# Patient Record
Sex: Male | Born: 1950
Health system: Southern US, Community
[De-identification: ages and names within clinical notes are randomized; demographics above are authoritative.]

## PROBLEM LIST (undated history)

## (undated) DIAGNOSIS — I8500 Esophageal varices without bleeding: Secondary | ICD-10-CM

## (undated) DIAGNOSIS — M1990 Unspecified osteoarthritis, unspecified site: Secondary | ICD-10-CM

## (undated) DIAGNOSIS — T7840XA Allergy, unspecified, initial encounter: Secondary | ICD-10-CM

## (undated) DIAGNOSIS — I10 Essential (primary) hypertension: Secondary | ICD-10-CM

## (undated) DIAGNOSIS — J45909 Unspecified asthma, uncomplicated: Secondary | ICD-10-CM

## (undated) DIAGNOSIS — Z973 Presence of spectacles and contact lenses: Secondary | ICD-10-CM

## (undated) DIAGNOSIS — K7460 Unspecified cirrhosis of liver: Secondary | ICD-10-CM

## (undated) DIAGNOSIS — K635 Polyp of colon: Secondary | ICD-10-CM

## (undated) DIAGNOSIS — K648 Other hemorrhoids: Secondary | ICD-10-CM

## (undated) DIAGNOSIS — I499 Cardiac arrhythmia, unspecified: Secondary | ICD-10-CM

## (undated) DIAGNOSIS — B1920 Unspecified viral hepatitis C without hepatic coma: Secondary | ICD-10-CM

## (undated) DIAGNOSIS — K5190 Ulcerative colitis, unspecified, without complications: Secondary | ICD-10-CM

## (undated) DIAGNOSIS — K226 Gastro-esophageal laceration-hemorrhage syndrome: Secondary | ICD-10-CM

## (undated) DIAGNOSIS — C801 Malignant (primary) neoplasm, unspecified: Secondary | ICD-10-CM

## (undated) DIAGNOSIS — F1010 Alcohol abuse, uncomplicated: Secondary | ICD-10-CM

## (undated) DIAGNOSIS — IMO0001 Reserved for inherently not codable concepts without codable children: Secondary | ICD-10-CM

## (undated) DIAGNOSIS — C220 Liver cell carcinoma: Secondary | ICD-10-CM

## (undated) HISTORY — DX: Polyp of colon: K63.5

## (undated) HISTORY — DX: Alcohol abuse, uncomplicated: F10.10

## (undated) HISTORY — DX: Allergy, unspecified, initial encounter: T78.40XA

## (undated) HISTORY — DX: Esophageal varices without bleeding: I85.00

## (undated) HISTORY — DX: Unspecified osteoarthritis, unspecified site: M19.90

## (undated) HISTORY — DX: Unspecified asthma, uncomplicated: J45.909

## (undated) HISTORY — DX: Liver cell carcinoma: C22.0

## (undated) HISTORY — DX: Ulcerative colitis, unspecified, without complications: K51.90

## (undated) HISTORY — DX: Essential (primary) hypertension: I10

## (undated) HISTORY — DX: Gastro-esophageal laceration-hemorrhage syndrome: K22.6

## (undated) HISTORY — DX: Other hemorrhoids: K64.8

---

## 1989-10-18 HISTORY — PX: SKIN GRAFT: SHX250

## 2008-06-16 ENCOUNTER — Emergency Department (HOSPITAL_COMMUNITY): Admission: AC | Admit: 2008-06-16 | Discharge: 2008-06-16 | Payer: Self-pay

## 2010-01-16 ENCOUNTER — Emergency Department (HOSPITAL_COMMUNITY): Admission: EM | Admit: 2010-01-16 | Discharge: 2010-01-16 | Payer: Self-pay | Admitting: Emergency Medicine
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
## 2014-07-18 ENCOUNTER — Encounter (HOSPITAL_COMMUNITY): Payer: Self-pay | Admitting: Emergency Medicine

## 2014-07-18 ENCOUNTER — Emergency Department (HOSPITAL_COMMUNITY)
Admission: EM | Admit: 2014-07-18 | Discharge: 2014-07-18 | Disposition: A | Discharge status: Home / Self Care | Payer: Medicaid Other | Attending: Emergency Medicine | Admitting: Emergency Medicine

## 2014-07-18 DIAGNOSIS — M5416 Radiculopathy, lumbar region: Secondary | ICD-10-CM | POA: Insufficient documentation

## 2014-07-18 DIAGNOSIS — Z8619 Personal history of other infectious and parasitic diseases: Secondary | ICD-10-CM | POA: Insufficient documentation

## 2014-07-18 DIAGNOSIS — Z720 Tobacco use: Secondary | ICD-10-CM | POA: Insufficient documentation

## 2014-07-18 HISTORY — DX: Unspecified viral hepatitis C without hepatic coma: B19.20

## 2014-07-18 MED ORDER — CYCLOBENZAPRINE HCL 10 MG PO TABS: 10.0000 mg | ORAL_TABLET | Freq: Two times a day (BID) | ORAL | Status: DC | PRN

## 2014-07-18 MED ORDER — TRAMADOL HCL 50 MG PO TABS: 50.0000 mg | ORAL_TABLET | Freq: Four times a day (QID) | ORAL | Status: DC | PRN

## 2014-07-18 NOTE — Discharge Instructions (Signed)
Take tramadol as directed as needed for pain. No driving or operating heavy machinery while taking flexeril. This medication may make you drowsy.  Lumbosacral Radiculopathy Lumbosacral radiculopathy is a pinched nerve or nerves in the low back (lumbosacral area). When this happens you may have weakness in your legs and may not be able to stand on your toes. You may have pain going down into your legs. There may be difficulties with walking normally. There are many causes of this problem. Sometimes this may happen from an injury, or simply from arthritis or boney problems. It may also be caused by other illnesses such as diabetes. If there is no improvement after treatment, further studies may be done to find the exact cause. DIAGNOSIS  X-rays may be needed if the problems become long standing. Electromyograms may be done. This study is one in which the working of nerves and muscles is studied. HOME CARE INSTRUCTIONS   Applications of ice packs may be helpful. Ice can be used in a plastic bag with a towel around it to prevent frostbite to skin. This may be used every 2 hours for 20 to 30 minutes, or as needed, while awake, or as directed by your caregiver.  Only take over-the-counter or prescription medicines for pain, discomfort, or fever as directed by your caregiver.  If physical therapy was prescribed, follow your caregiver's directions. SEEK IMMEDIATE MEDICAL CARE IF:   You have pain not controlled with medications.  You seem to be getting worse rather than better.  You develop increasing weakness in your legs.  You develop loss of bowel or bladder control.  You have difficulty with walking or balance, or develop clumsiness in the use of your legs.  You have a fever. MAKE SURE YOU:   Understand these instructions.  Will watch your condition.  Will get help right away if you are not doing well or get worse. Document Released: 10/04/2005 Document Revised: 12/27/2011 Document  Reviewed: 05/24/2008 [facility] Patient Information 2015 [place], [place]. This information is not intended to replace advice given to you by your health care provider. Make sure you discuss any questions you have with your health care provider.

## 2014-07-18 NOTE — ED Notes (Signed)
Pt c/o lower back pain x 2 weeks 

## 2014-07-18 NOTE — ED Provider Notes (Signed)
CSN: [id number]     Arrival date & time 07/18/14  5809 This chart was scribed for non-physician practitioner, Michele Mcalpine, PA-C, working with Carmin Muskrat, MD by Ladene Artist, ED Scribe. This patient was seen in room TR06C/TR06C and the patient's care was started at 9:08 AM.   Chief Complaint  Patient presents with  . Back Pain   The history is provided by the patient. No language interpreter was used.   HPI Comments: Peter Nelson is a 63 y.o. male who presents to the Emergency Department complaining of constant lower back pain for the last 2 weeks, worse since last night. Pt was trying to get out of bed when he initially noted the pain. Pain radiates down L leg. Pt currently rates his pain 7/10. He denies injury. He also denies urinary or bowel incontinence, numbness in groin or lower extremities. Pt has tried 5 Aleve tablets daily without relief. No h/o CA or IV drug use. No PCP; pt was recently released from prison 1 month ago.  Past Medical History  Diagnosis Date  . Hepatitis C    History reviewed. No pertinent past surgical history. History reviewed. No pertinent family history. History  Substance Use Topics  . Smoking status: Current Every Day Smoker  . Smokeless tobacco: Not on file  . Alcohol Use: Yes     Comment: occ    Review of Systems  Musculoskeletal: Positive for back pain.  Neurological: Negative for numbness.  All other systems reviewed and are negative.  Allergies  Aspirin  Home Medications   Prior to Admission medications   Medication Sig Start Date End Date Taking? Authorizing Provider  naproxen sodium (ALEVE) 220 MG tablet Take 440 mg by mouth 2 (two) times daily as needed (for pain).   Yes Historical Provider, MD  cyclobenzaprine (FLEXERIL) 10 MG tablet Take 1 tablet (10 mg total) by mouth 2 (two) times daily as needed for muscle spasms. 07/18/14   Illene Labrador, PA-C  traMADol (ULTRAM) 50 MG tablet Take 1 tablet (50 mg total) by mouth every 6  (six) hours as needed. 07/18/14   Illene Labrador, PA-C   Triage Vitals: BP 149/73  Pulse 81  Temp(Src) 97.7 F (36.5 C) (Oral)  Resp 18  SpO2 100% Physical Exam  Nursing note and vitals reviewed. Constitutional: He is oriented to person, place, and time. He appears well-developed and well-nourished. No distress.  HENT:  Head: Normocephalic and atraumatic.  Mouth/Throat: Oropharynx is clear and moist.  Eyes: Conjunctivae are normal.  Neck: Normal range of motion. Neck supple. No spinous process tenderness and no muscular tenderness present.  Cardiovascular: Normal rate, regular rhythm and normal heart sounds.   Pulmonary/Chest: Effort normal and breath sounds normal. No respiratory distress.  Musculoskeletal: He exhibits no edema.  Tenderness to palpation L lumbar paraspinal muscles and SI joint No spinal process tenderness Negative straight leg test bilateral  Neurological: He is alert and oriented to person, place, and time. He has normal strength.  Strength lower extremities 5/5 and equal bilateral. Sensation intact. Normal gait.  Skin: Skin is warm and dry. No rash noted. He is not diaphoretic.  Psychiatric: He has a normal mood and affect. His behavior is normal.   ED Course  Procedures (including critical care time) DIAGNOSTIC STUDIES: Oxygen Saturation is 100% on RA, normal by my interpretation.    COORDINATION OF CARE: 9:12 AM-Discussed treatment plan which includes medication for pain and follow-up with pt at bedside and pt agreed to plan.  Labs Review Labs Reviewed - No data to display  Imaging Review No results found.   EKG Interpretation None      MDM   Final diagnoses:  Lumbar radiculopathy    Patient with back pain, he is well appearing and in no apparent distress. Afebrile, vital signs stable. No red flags concerning patient's back pain. No s/s of central cord compression or cauda equina. Lower extremities are neurovascularly intact and patient is  ambulating without difficulty. Treat with tramadol, Flexeril. Resources given for PCP followup. Stable for discharge. Return precautions given. Patient states understanding of treatment care plan and is agreeable.  I personally performed the services described in this documentation, which was scribed in my presence. The recorded information has been reviewed and is accurate.    Illene Labrador, PA-C 07/18/14 [phone]

## 2014-07-18 NOTE — ED Provider Notes (Signed)
Medical screening examination/treatment/procedure(s) were performed by non-physician practitioner and as supervising physician I was immediately available for consultation/collaboration.  Carmin Muskrat, MD 07/18/14 [phone]

## 2014-07-24 ENCOUNTER — Ambulatory Visit: Payer: Self-pay

## 2014-11-13 ENCOUNTER — Encounter (HOSPITAL_COMMUNITY): Payer: Self-pay | Admitting: Emergency Medicine

## 2014-11-13 ENCOUNTER — Emergency Department (HOSPITAL_COMMUNITY): Payer: Medicaid Other

## 2014-11-13 ENCOUNTER — Emergency Department (HOSPITAL_COMMUNITY)
Admission: EM | Admit: 2014-11-13 | Discharge: 2014-11-13 | Disposition: A | Discharge status: Home / Self Care | Payer: Medicaid Other | Attending: Emergency Medicine | Admitting: Emergency Medicine

## 2014-11-13 DIAGNOSIS — R059 Cough, unspecified: Secondary | ICD-10-CM

## 2014-11-13 DIAGNOSIS — F1010 Alcohol abuse, uncomplicated: Secondary | ICD-10-CM | POA: Insufficient documentation

## 2014-11-13 DIAGNOSIS — R05 Cough: Secondary | ICD-10-CM | POA: Insufficient documentation

## 2014-11-13 DIAGNOSIS — R079 Chest pain, unspecified: Secondary | ICD-10-CM | POA: Insufficient documentation

## 2014-11-13 DIAGNOSIS — Z720 Tobacco use: Secondary | ICD-10-CM | POA: Insufficient documentation

## 2014-11-13 DIAGNOSIS — Z8619 Personal history of other infectious and parasitic diseases: Secondary | ICD-10-CM | POA: Insufficient documentation

## 2014-11-13 LAB — COMPREHENSIVE METABOLIC PANEL
ALT: 107 U/L — ABNORMAL HIGH (ref 0–53)
ANION GAP: 11 (ref 5–15)
AST: 159 U/L — ABNORMAL HIGH (ref 0–37)
Albumin: 3.5 g/dL (ref 3.5–5.2)
Alkaline Phosphatase: 63 U/L (ref 39–117)
BILIRUBIN TOTAL: 1.1 mg/dL (ref 0.3–1.2)
BUN: 10 mg/dL (ref 6–23)
CHLORIDE: 106 mmol/L (ref 96–112)
CO2: 25 mmol/L (ref 19–32)
Calcium: 8.8 mg/dL (ref 8.4–10.5)
Creatinine, Ser: 0.97 mg/dL (ref 0.50–1.35)
GFR calc non Af Amer: 86 mL/min — ABNORMAL LOW (ref >90)
Glucose, Bld: 94 mg/dL (ref 70–99)
POTASSIUM: 3.6 mmol/L (ref 3.5–5.1)
Sodium: 142 mmol/L (ref 135–145)
Total Protein: 7.4 g/dL (ref 6.0–8.3)

## 2014-11-13 LAB — CBC WITH DIFFERENTIAL/PLATELET
BASOS ABS: 0 10*3/uL (ref 0.0–0.1)
BASOS PCT: 1 % (ref 0–1)
EOS ABS: 0.2 10*3/uL (ref 0.0–0.7)
EOS PCT: 4 % (ref 0–5)
HCT: 43.6 % (ref 39.0–52.0)
Hemoglobin: 15.2 g/dL (ref 13.0–17.0)
LYMPHS ABS: 2.3 10*3/uL (ref 0.7–4.0)
Lymphocytes Relative: 47 % — ABNORMAL HIGH (ref 12–46)
MCH: 32.5 pg (ref 26.0–34.0)
MCHC: 34.9 g/dL (ref 30.0–36.0)
MCV: 93.2 fL (ref 78.0–100.0)
MONO ABS: 0.4 10*3/uL (ref 0.1–1.0)
MONOS PCT: 9 % (ref 3–12)
Neutro Abs: 1.9 10*3/uL (ref 1.7–7.7)
Neutrophils Relative %: 39 % — ABNORMAL LOW (ref 43–77)
PLATELETS: 191 10*3/uL (ref 150–400)
RBC: 4.68 MIL/uL (ref 4.22–5.81)
RDW: 12.3 % (ref 11.5–15.5)
WBC: 4.8 10*3/uL (ref 4.0–10.5)

## 2014-11-13 LAB — TROPONIN I: Troponin I: <0.03 ng/mL (ref <0.031)

## 2014-11-13 MED ORDER — SODIUM CHLORIDE 0.9 % IV BOLUS (SEPSIS)
500.0000 mL | Freq: Once | INTRAVENOUS | Status: AC
  Administered 2014-11-13: 500 mL via INTRAVENOUS

## 2014-11-13 NOTE — ED Notes (Signed)
IV team at bedside 

## 2014-11-13 NOTE — ED Notes (Signed)
Pt from home via [facility] with c/o productive cough x 2 weeks with yellow sputum.  Pt reports around 2 hours ago he began having intermittent sharp stabbing pain on his left chest/side lasting 20-30 seconds.  Denies SOB, N/V, diaphoresis.  Pt in NAD, A&O.

## 2014-11-13 NOTE — ED Provider Notes (Signed)
CSN: [id number]     Arrival date & time 11/13/14  1335 History   First MD Initiated Contact with Patient 11/13/14 1337     Chief Complaint  Patient presents with  . Chest Pain     (Consider location/radiation/quality/duration/timing/severity/associated sxs/prior Treatment) The history is provided by the patient. No language interpreter was used.  Aviv Lengacher Viana is a 64 y/o M with PMHx of Hepatitis C presenting to the ED with chest pain that started approximately 3 hours prior to arrival. Patient reported that the chest pain was localized left-sided the chest described as a stabbing sensation that comes and goes lasting approximately half the second. Patient reports that there is no radiation of pain, stays within the left side of the chest. Reported that he is not taking anything for the discomfort. Reported that for the past 2 weeks he's been having coughing of a yellowish phlegm. Stated that he has never been seen before regarding cardiology issues. Stated that his sister had history of myocardial infarction when she was 64 years old and myocardial infarction in his brother when he was in his early 5s. Patient reported that he smokes a half a pack of cigarettes per day. Stated that he drinks half a pint of gin daily. Denied dizziness, chills, shortness of breath, nausea, vomiting, diaphoresis, left arm pain, neck pain, neck stiffness, abdominal pain, nasal congestion, fever, blurred vision, sudden loss of vision. PCP none  Past Medical History  Diagnosis Date  . Hepatitis C    History reviewed. No pertinent past surgical history. History reviewed. No pertinent family history. History  Substance Use Topics  . Smoking status: Current Every Day Smoker -- 0.50 packs/day    Types: Cigarettes  . Smokeless tobacco: Never Used  . Alcohol Use: Yes     Comment: "a pint and half a day"    Review of Systems  Constitutional: Negative for fever and chills.  Eyes: Negative for visual disturbance.   Respiratory: Positive for cough. Negative for chest tightness and shortness of breath.   Cardiovascular: Positive for chest pain.  Gastrointestinal: Negative for nausea, vomiting and abdominal pain.  Musculoskeletal: Negative for back pain and neck pain.  Neurological: Negative for dizziness, weakness, numbness and headaches.      Allergies  Aspirin  Home Medications   Prior to Admission medications   Medication Sig Start Date End Date Taking? Authorizing Provider  DM-Doxylamine-Acetaminophen (NYQUIL COLD & FLU PO) Take 1 tablet by mouth at bedtime as needed (cough).   Yes Historical Provider, MD  naproxen sodium (ALEVE) 220 MG tablet Take 440 mg by mouth 2 (two) times daily as needed (for pain).   Yes Historical Provider, MD  Pseudoephedrine-APAP-DM (DAYQUIL MULTI-SYMPTOM COLD/FLU PO) Take 1 tablet by mouth daily as needed (cold symptoms).   Yes Historical Provider, MD   BP 164/89 mmHg  Pulse 73  Temp(Src) 98 F (36.7 C) (Oral)  Resp 18  Ht 5' 6"  (1.676 m)  Wt 145 lb (65.772 kg)  BMI 23.41 kg/m2  SpO2 97% Physical Exam  Constitutional: He is oriented to person, place, and time. He appears well-developed and well-nourished. No distress.  Smell of alcohol on breath  HENT:  Head: Normocephalic and atraumatic.  Eyes: Conjunctivae and EOM are normal. Pupils are equal, round, and reactive to light.  Neck: Normal range of motion. Neck supple.  Cardiovascular: Normal rate, regular rhythm and normal heart sounds.  Exam reveals no friction rub.   No murmur heard. Pulses:  Radial pulses are 2+ on the right side, and 2+ on the left side.  Pulmonary/Chest: Effort normal and breath sounds normal. No respiratory distress. He has no wheezes. He has no rales.  Musculoskeletal: Normal range of motion.  Neurological: He is alert and oriented to person, place, and time. No cranial nerve deficit. He exhibits normal muscle tone. Coordination normal.  Cranial nerves III-XII grossly  intact Strength 5+/5+ to upper and lower extremities bilaterally with resistance applied, equal distribution noted Equal grip strength bilaterally Negative arm drift Patient follows commands well Patient responds to questions appropriately  Skin: Skin is warm and dry. No rash noted. He is not diaphoretic. No erythema.  Psychiatric: He has a normal mood and affect. His behavior is normal. Thought content normal.  Nursing note and vitals reviewed.   ED Course  Procedures (including critical care time)  Results for orders placed or performed during the hospital encounter of 11/13/14  CBC with Differential/Platelet  Result Value Ref Range   WBC 4.8 4.0 - 10.5 K/uL   RBC 4.68 4.22 - 5.81 MIL/uL   Hemoglobin 15.2 13.0 - 17.0 g/dL   HCT 43.6 39.0 - 52.0 %   MCV 93.2 78.0 - 100.0 fL   MCH 32.5 26.0 - 34.0 pg   MCHC 34.9 30.0 - 36.0 g/dL   RDW 12.3 11.5 - 15.5 %   Platelets 191 150 - 400 K/uL   Neutrophils Relative % 39 (L) 43 - 77 %   Neutro Abs 1.9 1.7 - 7.7 K/uL   Lymphocytes Relative 47 (H) 12 - 46 %   Lymphs Abs 2.3 0.7 - 4.0 K/uL   Monocytes Relative 9 3 - 12 %   Monocytes Absolute 0.4 0.1 - 1.0 K/uL   Eosinophils Relative 4 0 - 5 %   Eosinophils Absolute 0.2 0.0 - 0.7 K/uL   Basophils Relative 1 0 - 1 %   Basophils Absolute 0.0 0.0 - 0.1 K/uL  Comprehensive metabolic panel  Result Value Ref Range   Sodium 142 135 - 145 mmol/L   Potassium 3.6 3.5 - 5.1 mmol/L   Chloride 106 96 - 112 mmol/L   CO2 25 19 - 32 mmol/L   Glucose, Bld 94 70 - 99 mg/dL   BUN 10 6 - 23 mg/dL   Creatinine, Ser 0.97 0.50 - 1.35 mg/dL   Calcium 8.8 8.4 - 10.5 mg/dL   Total Protein 7.4 6.0 - 8.3 g/dL   Albumin 3.5 3.5 - 5.2 g/dL   AST 159 (H) 0 - 37 U/L   ALT 107 (H) 0 - 53 U/L   Alkaline Phosphatase 63 39 - 117 U/L   Total Bilirubin 1.1 0.3 - 1.2 mg/dL   GFR calc non Af Amer 86 (L) >90 mL/min   GFR calc Af Amer >90 >90 mL/min   Anion gap 11 5 - 15  Troponin I  Result Value Ref Range    Troponin I <0.03 <0.031 ng/mL    Labs Review Labs Reviewed  CBC WITH DIFFERENTIAL/PLATELET - Abnormal; Notable for the following:    Neutrophils Relative % 39 (*)    Lymphocytes Relative 47 (*)    All other components within normal limits  COMPREHENSIVE METABOLIC PANEL - Abnormal; Notable for the following:    AST 159 (*)    ALT 107 (*)    GFR calc non Af Amer 86 (*)    All other components within normal limits  TROPONIN I  TROPONIN I    Imaging Review Dg  Chest 2 View  11/13/2014   CLINICAL DATA:  64 year old male with cough and left chest pain for 2 weeks. Initial encounter. Current history of hepatitis-C.  EXAM: CHEST  2 VIEW  COMPARISON:  None.  FINDINGS: Lung volumes at the upper limits of normal. Normal cardiac size and mediastinal contours. Visualized tracheal air column is within normal limits. No pneumothorax, pulmonary edema, pleural effusion or confluent pulmonary opacity. No acute osseous abnormality identified.  IMPRESSION: No acute cardiopulmonary abnormality.   Electronically Signed   By: Lars Pinks M.D.   On: 11/13/2014 14:29     EKG Interpretation   Date/Time:  Wednesday November 13 2014 13:42:31 EST Ventricular Rate:  69 PR Interval:  154 QRS Duration: 78 QT Interval:  413 QTC Calculation: 442 R Axis:   68 Text Interpretation:  Sinus rhythm Confirmed by ZAVITZ  MD, JOSHUA ([id number])  on 11/13/2014 3:52:37 PM      MDM   Final diagnoses:  Chest pain, unspecified chest pain type  Cough    Medications  sodium chloride 0.9 % bolus 500 mL (500 mLs Intravenous New Bag/Given 11/13/14 1648)    Filed Vitals:   11/13/14 1500 11/13/14 1530 11/13/14 1539 11/13/14 1545  BP: 153/83 164/89 164/89   Pulse: 68 64 73   Temp:    98 F (36.7 C)  TempSrc:    Oral  Resp: 18 10 18    Height:      Weight:      SpO2: 97% 98% 97%    EKG noted sinus rhythm with a heart rate of 69 bpm. Troponin negative elevation. CBC negative elevated leukocytosis. Hemoglobin 15.2, hematocrit  43.6. CMP unremarkable-elevated AST of 159, ALT of 107 - patient drinks alcohol daily. Chest x-ray negative for acute cardiopulmonary disease. HEART score of 3. Patient seen and assessed by attending physician, Dr. Rosalyn Gess - recommended delta troponin to be performed and if negative patient can be discharged home - chest pain is atypical. Referral for cardiology to be given. Discussed case with Starlyn Skeans, PA-C. Transfer of care to [facility], PA-C at change in shift.    Jamse Mead, PA-C 11/13/14 1706  Mariea Clonts, MD 11/16/14 [phone]

## 2014-11-13 NOTE — Discharge Instructions (Signed)
Please call your doctor for a followup appointment within 24-48 hours. When you talk to your doctor please let them know that you were seen in the emergency department and have them acquire all of your records so that they can discuss the findings with you and formulate a treatment plan to fully care for your new and ongoing problems. Please call and set-up an appointment with Health and [place] Please call and set-up an appointment Cardiology as an outpatient Please rest and stay hydrated Please continue to monitor symptoms closely and if symptoms are to worsen or change (fever greater than 101, chills, sweating, nausea, vomiting, chest pain, shortness of breathe, difficulty breathing, weakness, numbness, tingling, worsening or changes to pain pattern, worsening or changes to chest pain, inability to keep food or fluids down, dizziness, fainting) please report back to the Emergency Department immediately.    Chest Pain (Nonspecific) It is often hard to give a specific diagnosis for the cause of chest pain. There is always a chance that your pain could be related to something serious, such as a heart attack or a blood clot in the lungs. You need to follow up with your health care provider for further evaluation. CAUSES   Heartburn.  Pneumonia or bronchitis.  Anxiety or stress.  Inflammation around your heart (pericarditis) or lung (pleuritis or pleurisy).  A blood clot in the lung.  A collapsed lung (pneumothorax). It can develop suddenly on its own (spontaneous pneumothorax) or from trauma to the chest.  Shingles infection (herpes zoster virus). The chest wall is composed of bones, muscles, and cartilage. Any of these can be the source of the pain.  The bones can be bruised by injury.  The muscles or cartilage can be strained by coughing or overwork.  The cartilage can be affected by inflammation and become sore (costochondritis). DIAGNOSIS  Lab tests or other studies may be  needed to find the cause of your pain. Your health care provider may have you take a test called an ambulatory electrocardiogram (ECG). An ECG records your heartbeat patterns over a 24-hour period. You may also have other tests, such as:  Transthoracic echocardiogram (TTE). During echocardiography, sound waves are used to evaluate how blood flows through your heart.  Transesophageal echocardiogram (TEE).  Cardiac monitoring. This allows your health care provider to monitor your heart rate and rhythm in real time.  Holter monitor. This is a portable device that records your heartbeat and can help diagnose heart arrhythmias. It allows your health care provider to track your heart activity for several days, if needed.  Stress tests by exercise or by giving medicine that makes the heart beat faster. TREATMENT   Treatment depends on what may be causing your chest pain. Treatment may include:  Acid blockers for heartburn.  Anti-inflammatory medicine.  Pain medicine for inflammatory conditions.  Antibiotics if an infection is present.  You may be advised to change lifestyle habits. This includes stopping smoking and avoiding alcohol, caffeine, and chocolate.  You may be advised to keep your head raised (elevated) when sleeping. This reduces the chance of acid going backward from your stomach into your esophagus. Most of the time, nonspecific chest pain will improve within 2-3 days with rest and mild pain medicine.  HOME CARE INSTRUCTIONS   If antibiotics were prescribed, take them as directed. Finish them even if you start to feel better.  For the next few days, avoid physical activities that bring on chest pain. Continue physical activities as directed.  Do not use any tobacco products, including cigarettes, chewing tobacco, or electronic cigarettes.  Avoid drinking alcohol.  Only take medicine as directed by your health care provider.  Follow your health care provider's suggestions  for further testing if your chest pain does not go away.  Keep any follow-up appointments you made. If you do not go to an appointment, you could develop lasting (chronic) problems with pain. If there is any problem keeping an appointment, call to reschedule. SEEK MEDICAL CARE IF:   Your chest pain does not go away, even after treatment.  You have a rash with blisters on your chest.  You have a fever. SEEK IMMEDIATE MEDICAL CARE IF:   You have increased chest pain or pain that spreads to your arm, neck, jaw, back, or abdomen.  You have shortness of breath.  You have an increasing cough, or you cough up blood.  You have severe back or abdominal pain.  You feel nauseous or vomit.  You have severe weakness.  You faint.  You have chills. This is an emergency. Do not wait to see if the pain will go away. Get medical help at once. Call your local emergency services (911 in U.[place] Do not drive yourself to the hospital. MAKE SURE YOU:   Understand these instructions.  Will watch your condition.  Will get help right away if you are not doing well or get worse. Document Released: 07/14/2005 Document Revised: 10/09/2013 Document Reviewed: 05/09/2008 [facility] Patient Information 2015 [place], [place]. This information is not intended to replace advice given to you by your health care provider. Make sure you discuss any questions you have with your health care provider.   Emergency Department Resource Guide 1) Find a Doctor and Pay Out of Pocket Although you won't have to find out who is covered by your insurance plan, it is a good idea to ask around and get recommendations. You will then need to call the office and see if the doctor you have chosen will accept you as a new patient and what types of options they offer for patients who are self-pay. Some doctors offer discounts or will set up payment plans for their patients who do not have insurance, but you will need to ask so you aren't  surprised when you get to your appointment.  2) Contact Your Local Health Department Not all health departments have doctors that can see patients for sick visits, but many do, so it is worth a call to see if yours does. If you don't know where your local health department is, you can check in your phone book. The CDC also has a tool to help you locate your state's health department, and many state websites also have listings of all of their local health departments.  3) Find a [facility] If your illness is not likely to be very severe or complicated, you may want to try a walk in clinic. These are popping up all over the country in pharmacies, drugstores, and shopping centers. They're usually staffed by nurse practitioners or physician assistants that have been trained to treat common illnesses and complaints. They're usually fairly quick and inexpensive. However, if you have serious medical issues or chronic medical problems, these are probably not your best option.  No Primary Care Doctor: - Call Health Connect at  [phone] - they can help you locate a primary care doctor that  accepts your insurance, provides certain services, etc. - Physician Referral Service- [phone]  Chronic Pain Problems: Organization  Address  Phone   Notes  [facility]  [phone] Patients need to be referred by their primary care doctor.   Medication Assistance: Organization         Address  Phone   Notes  [facility] Medication [facility] [address] [place], [place] [address] [phone] --Must be a resident of [facility] -- Must have NO insurance coverage whatsoever (no Medicaid/ Medicare, etc.) -- The pt. MUST have a primary care doctor that directs their care regularly and follows them in the community   MedAssist  [phone]   [employer]  [phone]    Agencies that provide inexpensive medical care: Organization          Address  Phone   Notes  [place]  [phone]   [facility]    [phone]   [facility] [place], [place] [address] [phone]   [place] [address] [place] [phone]   Planned Parenthood    [phone]   [facility]    [phone]   [place] and [place] [address], [place] Phone:  [phone], Fax:  [phone] Hours of Operation:  9 am - 6 pm, M-F.  Also accepts Medicaid/Medicare and self-pay.  [facility] for [place], [address], [place] Phone: [phone], Fax: [phone]. Hours of Operation:  8:30 am - 5:30 pm, M-F.  Also accepts Medicaid and self-pay.  [facility] [place] [address] [place] Phone: [phone]   [place], [place], [place] [phone], Ext. [phone] Mondays & Thursdays: 7-9 AM.  First 15 patients are seen on a first come, first serve basis.    [place] Providers:  Organization         Address  Phone   Notes  [facility] [address], [place] [phone] Also accepts self-pay patients.  [facility] [address], [place]  [phone]   [place], [address], [place] [phone]   [facility] [address] [place] [phone]   Lucianne Lei [address], [place]   [phone] Only accepts [place] Access [place] patients after they have their name applied to their card.   Self-Pay (no insurance) in [facility]:  Organization         Address  Phone   Notes  Sickle Cell Patients, [facility] Internal Medicine [place] [phone]   [facility] [place] [phone]     [facility] [place]  [place], [place], [place] [phone]   [facility]/Dr. Osei-Bonsu  [address] [place] or [address], [place] [phone] Phone number for both [place] and [place] locations is the same.  Urgent Medical and [facility] [address], [place] [phone]   [facility] [address], [place] or [address] [phone]   [facility] [address] [place] [phone], phone; [phone], fax Sees patients 1st and 3rd Saturday of every month.  Must not  qualify for public or private insurance (i.e. Medicaid, Medicare, [place] Health Choice, Veterans' Benefits)  Household income should be no more than 200% of the poverty level The clinic cannot treat you if you are pregnant or think you are pregnant  Sexually transmitted diseases are not treated at the clinic.    Dental Care: Organization         Address  Phone  Notes  [facility] Department of [facility] [place] [phone] Accepts children up to age 61 who are enrolled in [place] or [place]; pregnant women with a Medicaid card; and children who have applied for Medicaid or [place] Health Choice, but were declined, whose parents can pay a reduced fee at time of service.  [facility] Department of [facility]  [address], [place] [phone] Accepts children up to age 55 who are enrolled in [place] or [place]; pregnant women with a Medicaid card; and children who have applied for Medicaid or [place] Health Choice, but were declined, whose parents can pay a reduced fee at time of service.  [place] Adult Dental Access PROGRAM  [place] [phone] Patients are seen by appointment only. Walk-ins are not accepted. [place] will see patients 73  years of age and older. Monday - Tuesday (8am-5pm) Most Wednesdays (8:30-5pm) $30 per visit, cash only  [facility] Adult Dental Access PROGRAM  [address], [facility] [phone] Patients are seen by appointment only. Walk-ins are not accepted. [place] will see patients 36 years of age and older. One Wednesday Evening (Monthly: Volunteer Based).  $30 per visit, cash only  [name]  [phone] for adults; Children under age 53, call Graduate Pediatric Dentistry at [phone]. Children aged 46-14, please call [phone] to request a pediatric application.  Dental services are provided in all areas of dental care including fillings, crowns and bridges, complete and partial dentures, implants, gum treatment, root canals, and extractions. Preventive care is also provided. Treatment is provided to both adults and children. Patients are selected via a lottery and there is often a waiting list.   [facility] [address] [place]  [phone] [email or web address]   [facility] [address] [place], [place] [phone], Ext. [phone] Second and Fourth Thursday of each month, opens at 6:30 AM; Clinic ends at 9 AM.  Patients are seen on a first-come first-served basis, and a limited number are seen during each clinic.   [facility]  [address] Hillard Danker [place], [place] [phone]   Eligibility Requirements You must have lived in [place], [place], or [place] counties for at least the last three months.   You cannot be eligible for state or federal sponsored [employer], including [employer], [place], or [employer].   You generally cannot be eligible for healthcare insurance through your employer.    How to apply: Eligibility screenings are held every Tuesday and Wednesday afternoon from 1:00 pm until 4:00 pm. You do not need an appointment for the interview!  [facility]  [address] [place], [place]   [place]  [place] Department  [place]  [phone]    Behavioral Health Resources in the Community: Intensive Outpatient Programs Organization         Address  Phone  Notes  [place] [address] [place], [place] [phone]   [facility] Outpatient [address], [place], [place]   ADS: Alcohol & Drug Svcs [address], [place], [place]   [place] [address]  [place], [place] or [phone]   Substance Abuse Resources Organization         Address  Phone  Notes  Alcohol and Drug Services  [phone]   [place]  [phone]   [facility] [place]   Chinita Pester  [phone]   Residential & Outpatient Substance Abuse Program  [phone]   Psychological Services Organization         Address  Phone  Notes  [facility] [place]  [place]  [phone]   [place] [address], [place] or [phone]    Mobile Crisis Teams Organization         Address  Phone  Notes  [facility], Mobile Crisis Care Unit  [phone]   [facility]ive Psychotherapeutic Services  [address] [place], [place]   [place] [address] [place] [address] [place] [phone]    Self-Help/Support Groups Organization         Address  Phone             Notes  [place]. of [place] - variety of support groups  [place] Call for more information  Narcotics Anonymous (NA), [facility] [address], [employer] [place]  2 meetings at this location   Special educational needs teacher         Address  Phone  Notes  [facility]AP Residential Treatment [place],    [place]   [phone]   [facility]  [address] [place] [address]390, [place], [place]   [place], [place] [phone] Admissions: 8am-3pm M-F  Incentives Substance [place] [address],    [place], [place] [phone]   [facility] [address] [place], [place], [place]   [facility] [address]  [place], [place]   [facility] Programs - Intensive Outpatient [address], [place], [place]   [facility] ([place] [address]  [place], [place] [phone] or [phone]   [facility] Services (RTS) [address] [place], [place] Accepts Medicaid  [facility] [place] [address],  [place] [phone] Substance Abuse/Addiction Treatment   [facility] Organization         Address  Phone  Notes  [facility]  [phone]   Domenic Schwab, PhD [address] Arlis [place], [place]   [phone] or [phone]   [place], [place] [phone]   [place] [address] [place], [place] [phone] Insurance/Medicaid/sponsorship through [facility] and Families [address], [address] [place]                                    [place], [place] [phone] [place] [address] [place] [phone]    Dr. Adele Schilder  [phone]   [facility]  [place] Dept. 1) [address] [place] 2) [place] 3)  [place] [address], [place] [phone]  [phone]   [place] [phone] or [phone] (After Hours)

## 2014-11-13 NOTE — ED Notes (Signed)
Pt undressed, in gown, on monitor, continuous pulse oximetry and blood pressure cuff; wife at bedside

## 2014-11-13 NOTE — ED Provider Notes (Signed)
  Physical Exam  BP 128/58 mmHg  Pulse 67  Temp(Src) 98 F (36.7 C) (Oral)  Resp 16  Ht 5' 6"  (1.676 m)  Wt 145 lb (65.772 kg)  BMI 23.41 kg/m2  SpO2 97%  Physical Exam  Constitutional: He is oriented to person, place, and time. He appears well-developed and well-nourished. No distress.  HENT:  Head: Normocephalic and atraumatic.  Mouth/Throat: Oropharynx is clear and moist. No oropharyngeal exudate.  Eyes: Conjunctivae and EOM are normal. Pupils are equal, round, and reactive to light. No scleral icterus.  Neck: Normal range of motion. Neck supple. No JVD present. No thyromegaly present.  Cardiovascular: Normal rate, regular rhythm, normal heart sounds and intact distal pulses.  Exam reveals no gallop and no friction rub.   No murmur heard. Pulmonary/Chest: Effort normal and breath sounds normal. No respiratory distress. He has no wheezes. He has no rales. He exhibits no tenderness.  Musculoskeletal: Normal range of motion.  Lymphadenopathy:    He has no cervical adenopathy.  Neurological: He is alert and oriented to person, place, and time.  Skin: Skin is warm and dry. He is not diaphoretic.  Psychiatric: He has a normal mood and affect. His behavior is normal. Judgment and thought content normal.  Nursing note and vitals reviewed.   ED Course  Procedures  MDM Patient is a 64 year old male who presents emergency room for evaluation of chest pain. Patient was signed out to me at shift change by the previous provider. Plan was that F Delzer troponin was negative patient was to be discharged home to follow up with cardiology in one day. Delta troponin is negative. Patient case be chest pain-free. Patient cannot take aspirin due to hepatitis. Patient stable for discharge at this time. Patient has a heart score 3. Patient to return for worsening chest pain, shortness breath, weakness, numbness, or any other concerning symptoms. He states understanding and agreement at this time.  Patient also to be given referral to the [place] at this time.      Cherylann Parr, PA-C 11/13/14 [place], MD 11/13/14 1911

## 2014-11-28 ENCOUNTER — Encounter: Payer: Medicaid Other | Admitting: Cardiovascular Disease

## 2014-12-19 ENCOUNTER — Encounter: Payer: Medicaid Other | Admitting: Cardiovascular Disease

## 2014-12-19 ENCOUNTER — Emergency Department (INDEPENDENT_AMBULATORY_CARE_PROVIDER_SITE_OTHER): Payer: Self-pay

## 2014-12-19 ENCOUNTER — Emergency Department (INDEPENDENT_AMBULATORY_CARE_PROVIDER_SITE_OTHER)
Admission: EM | Admit: 2014-12-19 | Discharge: 2014-12-19 | Disposition: A | Discharge status: Home / Self Care | Payer: Self-pay | Source: Home / Self Care | Attending: Family Medicine | Admitting: Family Medicine

## 2014-12-19 ENCOUNTER — Encounter (HOSPITAL_COMMUNITY): Payer: Self-pay | Admitting: *Deleted

## 2014-12-19 DIAGNOSIS — S62309A Unspecified fracture of unspecified metacarpal bone, initial encounter for closed fracture: Secondary | ICD-10-CM

## 2014-12-19 DIAGNOSIS — S62339A Displaced fracture of neck of unspecified metacarpal bone, initial encounter for closed fracture: Secondary | ICD-10-CM

## 2014-12-19 NOTE — Discharge Instructions (Signed)
Boxer's Fracture You have a break (fracture) of the fifth metacarpal bone. This is commonly called a boxer's fracture. This is the bone in the hand where the little finger attaches. The fracture is in the end of that bone, closest to the little finger. It is usually caused when you hit an object with a clenched fist. Often, the knuckle is pushed down by the impact. Sometimes, the fracture rotates out of position. A boxer's fracture will usually heal within 6 weeks, if it is treated properly and protected from re-injury. Surgery is sometimes needed. A cast, splint, or bulky hand dressing may be used to protect and immobilize a boxer's fracture. Do not remove this device or dressing until your caregiver approves. Keep your hand elevated, and apply ice packs for 15-20 minutes every 2 hours, for the first 2 days. Elevation and ice help reduce swelling and relieve pain. See your caregiver, or an orthopedic specialist, for follow-up care within the next 10 days. This is to make sure your fracture is healing properly. Document Released: 10/04/2005 Document Revised: 12/27/2011 Document Reviewed: 03/24/2007 [facility] Patient Information 2015 [place], [place]. This information is not intended to replace advice given to you by your health care provider. Make sure you discuss any questions you have with your health care provider.  Cast Care The purpose of a cast is to protect and immobilize an injured part of the body. This may be necessary after fractures, surgery, or other injuries. Splints are another form of immobilization; however, they are usually not as rigid as a cast, which accommodates swelling of the injury while still maintaining immobilization. Splints are typically used in the immediate post injury or postoperative period, before changing to a cast.  Before the rigid material of a cast is formed, a layer of padding is first applied to protect the injury. The rigid portion of a cast is created by wrapping  gauze saturated with plaster of [place] around the injury; alternatively, the cast may be made of fiberglass. During the period of immobilization, a cast may need to be changed multiple times. The length of immobilization is dependent on the severity of the injury and the time needed for healing. This period can vary from a couple weeks to many months. After a cast is created, radiographs (X-rays) through the cast determine if a satisfactory alignment of the bones was achieved. Radiographs may also be used throughout the healing period to check for signs of bone healing.  CARE OF THE CAST   Allow the cast to dry and harden completely before applying any pressure to it.  Applying pressure too early may create a point of excessive pressure on the skin, which may increase the risk of forming an ulcer. Drying time depends on the type of cast but may last as long as 24 hours.  When a cast gets wet, a soft area may appear. If this happens accidentally, return to the doctor's office, emergency room, or outpatient surgical facility as soon as possible for repairs or changing of the cast.  Do not get sand in the cast.  Do not place anything inside the cast. This includes items intended to scratch an area of skin that itches. ITCHING INSIDE A CAST   It is very common for a person with a cast to experience an itching sensation under the cast.  Do not scratch any area under the cast even if the area is within reach. The skin under a cast in an environment of increased risk for injury.  Do not put anything in the cast.  If there is no wound, such as an incision from surgery, you may sprinkle cornstarch into the cast to relieve itching.  If a wound is present under the cast, consult your caregiver for pain medication or medication to reduce itching.  Using a hairdryer (on the cold setting) is a useful technique for reducing an itching sensation. CARE OF THE PATIENT IN A CAST It is important to elevate the  body part that is in a cast to a level equal to or above that of the heart whenever possible. Elevating the injured body part may reduce the likelihood of swelling. Elevation of a leg in a cast may be achieved by resting the leg on a pillow when in bed and on a footstool or chair when sitting. For an arm in a cast, rest the arm on a pillow placed on the chest.  No matter how well one follows the necessary precautions, excessive swelling may occur under the cast. Signs and symptoms of excessive swelling include:  Severe and persistent pain.  Change in color of the tissues beyond the end of the cast, such as a change to blue or gray under the nails of the fingers or toes.  Coldness of the tissues beyond the cast when the rest of the body is warm.  Numbness or complete loss of feeling in the skin beyond the cast.  Feeling of tightness under the cast after it dries.  Swelling of the tissue to a greater extent than was present before the cast was applied.  For a leg cast, inability to raise the big toe. If any of these signs or symptoms occur, contact your caregiver or an emergency room as soon as possible for treatment.  Infection Inside a Cast On occasion, an injury may become infected during healing. The most important way to fight an infection is to detect it early; however, early detection may be difficult if the infected area is covered by a cast. Infection should be reported immediately to your caregiver. The following are common signs and symptoms of infection:   Foul smell.  Fever greater than 101F (38.3C) (may be accompanied by a general ill feeling).  Leakage of fluid through the cast.  Increasing pain or soreness of the skin under the cast. Bathing with a Cast Bathing is often a difficult task with a cast. The cast must be kept dry at all times, unless otherwise specified by your caregiver. If the cast is on a limb, such as your arm or leg, it is often easier to take a bath with  the extremity in a cast propped up on the side of the tub or a chair, out of the water. If the cast is on the trunk of the body, you should take sponge baths until the cast is removed.  Document Released: 10/04/2005 Document Revised: 02/18/2014 Document Reviewed: 01/16/2009 [facility] Patient Information 2015 [place], [place]. This information is not intended to replace advice given to you by your health care provider. Make sure you discuss any questions you have with your health care provider.  Cast or Splint Care Casts and splints support injured limbs and keep bones from moving while they heal. It is important to care for your cast or splint at home.  HOME CARE INSTRUCTIONS  Keep the cast or splint uncovered during the drying period. It can take 24 to 48 hours to dry if it is made of plaster. A fiberglass cast will dry in less  than 1 hour.  Do not rest the cast on anything harder than a pillow for the first 24 hours.  Do not put weight on your injured limb or apply pressure to the cast until your health care provider gives you permission.  Keep the cast or splint dry. Wet casts or splints can lose their shape and may not support the limb as well. A wet cast that has lost its shape can also create harmful pressure on your skin when it dries. Also, wet skin can become infected.  Cover the cast or splint with a plastic bag when bathing or when out in the rain or snow. If the cast is on the trunk of the body, take sponge baths until the cast is removed.  If your cast does become wet, dry it with a towel or a blow dryer on the cool setting only.  Keep your cast or splint clean. Soiled casts may be wiped with a moistened cloth.  Do not place any hard or soft foreign objects under your cast or splint, such as cotton, toilet paper, lotion, or powder.  Do not try to scratch the skin under the cast with any object. The object could get stuck inside the cast. Also, scratching could lead to an  infection. If itching is a problem, use a blow dryer on a cool setting to relieve discomfort.  Do not trim or cut your cast or remove padding from inside of it.  Exercise all joints next to the injury that are not immobilized by the cast or splint. For example, if you have a long leg cast, exercise the hip joint and toes. If you have an arm cast or splint, exercise the shoulder, elbow, thumb, and fingers.  Elevate your injured arm or leg on 1 or 2 pillows for the first 1 to 3 days to decrease swelling and pain.It is best if you can comfortably elevate your cast so it is higher than your heart. SEEK MEDICAL CARE IF:   Your cast or splint cracks.  Your cast or splint is too tight or too loose.  You have unbearable itching inside the cast.  Your cast becomes wet or develops a soft spot or area.  You have a bad smell coming from inside your cast.  You get an object stuck under your cast.  Your skin around the cast becomes red or raw.  You have new pain or worsening pain after the cast has been applied. SEEK IMMEDIATE MEDICAL CARE IF:   You have fluid leaking through the cast.  You are unable to move your fingers or toes.  You have discolored (blue or white), cool, painful, or very swollen fingers or toes beyond the cast.  You have tingling or numbness around the injured area.  You have severe pain or pressure under the cast.  You have any difficulty with your breathing or have shortness of breath.  You have chest pain. Document Released: 10/01/2000 Document Revised: 07/25/2013 Document Reviewed: 04/12/2013 [facility] Patient Information 2015 [place], [place]. This information is not intended to replace advice given to you by your health care provider. Make sure you discuss any questions you have with your health care provider.
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
## 2014-12-19 NOTE — Progress Notes (Signed)
Orthopedic Tech Progress Note Patient Details:  Peter Nelson [facility] 04/14/1951 [id number]  Casting Type of Cast: Short arm cast Cast Location: RUE  Cast Intervention: Application     [name] 12/19/2014, 4:40 PM

## 2015-01-06 ENCOUNTER — Emergency Department (INDEPENDENT_AMBULATORY_CARE_PROVIDER_SITE_OTHER)
Admission: EM | Admit: 2015-01-06 | Discharge: 2015-01-06 | Disposition: A | Discharge status: Home / Self Care | Payer: Self-pay | Source: Home / Self Care | Attending: Emergency Medicine | Admitting: Emergency Medicine

## 2015-01-06 ENCOUNTER — Encounter (HOSPITAL_COMMUNITY): Payer: Self-pay | Admitting: Emergency Medicine

## 2015-01-06 DIAGNOSIS — T7840XA Allergy, unspecified, initial encounter: Secondary | ICD-10-CM

## 2015-01-06 DIAGNOSIS — L309 Dermatitis, unspecified: Secondary | ICD-10-CM

## 2015-01-06 MED ORDER — HYDROXYZINE HCL 25 MG PO TABS: 25.0000 mg | ORAL_TABLET | Freq: Four times a day (QID) | ORAL | Status: DC

## 2015-01-06 MED ORDER — PREDNISONE 20 MG PO TABS: ORAL_TABLET | ORAL | Status: DC

## 2015-01-06 MED ORDER — METHYLPREDNISOLONE SODIUM SUCC 125 MG IJ SOLR
125.0000 mg | Freq: Once | INTRAMUSCULAR | Status: AC
  Administered 2015-01-06: 125 mg via INTRAMUSCULAR

## 2015-01-06 MED ORDER — METHYLPREDNISOLONE SODIUM SUCC 125 MG IJ SOLR
INTRAMUSCULAR | Status: AC
  Filled 2015-01-06: qty 2

## 2015-01-06 MED ORDER — TRIAMCINOLONE ACETONIDE 0.5 % EX OINT: 1.0000 "application " | TOPICAL_OINTMENT | Freq: Two times a day (BID) | CUTANEOUS | Status: DC

## 2015-01-06 NOTE — ED Provider Notes (Signed)
CSN: [id number]     Arrival date & time 01/06/15  1345 History   First MD Initiated Contact with Patient 01/06/15 1605     Chief Complaint  Patient presents with  . Rash   (Consider location/radiation/quality/duration/timing/severity/associated sxs/prior Treatment) HPI [name] here for evaluation of rash. He states for the last month he has had a very itchy rash over his back, chest, and upper arms. He also has a rash on his legs but states that is his more typical eczema. He has tried multiple over-the-counter agents to help with the itching without improvement. His partner states he only uses things once or maybe twice before giving up on them. No fevers or chills. He denies any new lotions or soaps. He does use a scented and colored oil rub regularly.  Past Medical History  Diagnosis Date  . Hepatitis C    History reviewed. No pertinent past surgical history. History reviewed. No pertinent family history. History  Substance Use Topics  . Smoking status: Current Every Day Smoker -- 0.50 packs/day    Types: Cigarettes  . Smokeless tobacco: Never Used  . Alcohol Use: Yes     Comment: "a pint and half a day"    Review of Systems As in history of present illness Allergies  Aspirin  Home Medications   Prior to Admission medications   Medication Sig Start Date End Date Taking? Authorizing Provider  DM-Doxylamine-Acetaminophen (NYQUIL COLD & FLU PO) Take 1 tablet by mouth at bedtime as needed (cough).    Historical Provider, MD  hydrOXYzine (ATARAX/VISTARIL) 25 MG tablet Take 1 tablet (25 mg total) by mouth every 6 (six) hours. 01/06/15   Melony Overly, MD  naproxen sodium (ALEVE) 220 MG tablet Take 440 mg by mouth 2 (two) times daily as needed (for pain).    Historical Provider, MD  predniSONE (DELTASONE) 20 MG tablet Take 3 tablets for 3 days, then 2 tablets for 3 days, then 1 tablet for 3 days. 01/06/15   Melony Overly, MD  Pseudoephedrine-APAP-DM (DAYQUIL  MULTI-SYMPTOM COLD/FLU PO) Take 1 tablet by mouth daily as needed (cold symptoms).    Historical Provider, MD  triamcinolone ointment (KENALOG) 0.5 % Apply 1 application topically 2 (two) times daily. To affected areas 01/06/15   Melony Overly, MD   BP 147/84 mmHg  Pulse 65  Temp(Src) 98 F (36.7 C) (Oral)  Resp 16  SpO2 98% Physical Exam  Constitutional: He is oriented to person, place, and time. He appears well-developed and well-nourished. He appears distressed (constantly scratching at his back).  Cardiovascular: Normal rate.   Pulmonary/Chest: Effort normal.  Neurological: He is alert and oriented to person, place, and time.  Skin:  Eczematous rash and posterior knees. He has diffuse erythematous slightly raised papules and plaques over his chest, back, and arms.    ED Course  Procedures (including critical care time) Labs Review Labs Reviewed - No data to display  Imaging Review No results found.   MDM   1. Allergic reaction, initial encounter   2. Eczema    Rash is most consistent with allergic reaction. Solu-Medrol 125 mg IM given here. We'll discharge home with a prednisone taper and hydroxyzine. Triamcinolone ointment for eczema on his legs. Discussed getting rid of anything that has a dye or scent in it. Follow-up if no improvement in 3-4 days.    Melony Overly, MD 01/06/15 1630

## 2015-01-06 NOTE — ED Notes (Signed)
Pt reports a rash on his torso, arms, and legs.  The rash on the legs he thinks is eczema, but the rash on the rest of his body is different.  It is patchy and red and itches.  He has had it for about a month.

## 2015-01-06 NOTE — Discharge Instructions (Signed)
The rash on your legs is eczema. Use the triamcinolone ointment twice a day as needed.  The rash on your back, chest, and arms is likely due to an allergic reaction. Get rid of anything that has dye or scent in it. We gave you a shot of a steroid today. Start the prednisone taper tomorrow. He will take 3 pills for 3 days, then 2 pills for 3 days, then 1 pill for 3 days. Take hydroxyzine every 6 hours for the next 3 days to help with the itching. After that, you can use it as needed.  Follow-up here or at the [facility] if no improvement by Thursday.

## 2015-03-14 ENCOUNTER — Emergency Department (HOSPITAL_COMMUNITY): Payer: Self-pay

## 2015-03-14 ENCOUNTER — Emergency Department (HOSPITAL_COMMUNITY)
Admission: EM | Admit: 2015-03-14 | Discharge: 2015-03-14 | Discharge status: Against Medical Advice | Payer: Self-pay | Attending: Emergency Medicine | Admitting: Emergency Medicine

## 2015-03-14 ENCOUNTER — Encounter (HOSPITAL_COMMUNITY): Payer: Self-pay | Admitting: *Deleted

## 2015-03-14 DIAGNOSIS — Z720 Tobacco use: Secondary | ICD-10-CM | POA: Insufficient documentation

## 2015-03-14 DIAGNOSIS — R079 Chest pain, unspecified: Secondary | ICD-10-CM | POA: Insufficient documentation

## 2015-03-14 LAB — BASIC METABOLIC PANEL
ANION GAP: 12 (ref 5–15)
BUN: 12 mg/dL (ref 6–20)
CO2: 22 mmol/L (ref 22–32)
Calcium: 8.9 mg/dL (ref 8.9–10.3)
Chloride: 108 mmol/L (ref 101–111)
Creatinine, Ser: 1.04 mg/dL (ref 0.61–1.24)
GFR calc non Af Amer: >60 mL/min (ref >60)
Glucose, Bld: 117 mg/dL — ABNORMAL HIGH (ref 65–99)
Potassium: 3.6 mmol/L (ref 3.5–5.1)
SODIUM: 142 mmol/L (ref 135–145)

## 2015-03-14 LAB — CBC
HCT: 41.1 % (ref 39.0–52.0)
HEMOGLOBIN: 14.2 g/dL (ref 13.0–17.0)
MCH: 32.1 pg (ref 26.0–34.0)
MCHC: 34.5 g/dL (ref 30.0–36.0)
MCV: 92.8 fL (ref 78.0–100.0)
Platelets: 193 10*3/uL (ref 150–400)
RBC: 4.43 MIL/uL (ref 4.22–5.81)
RDW: 12.6 % (ref 11.5–15.5)
WBC: 4.7 10*3/uL (ref 4.0–10.5)

## 2015-03-14 LAB — I-STAT TROPONIN, ED: TROPONIN I, POC: 0 ng/mL (ref 0.00–0.08)

## 2015-03-14 NOTE — ED Notes (Signed)
Called for pt with no answer x 1

## 2015-03-14 NOTE — ED Notes (Signed)
Called for pt x 3 no answer

## 2015-03-14 NOTE — ED Notes (Signed)
Called for pt with no answer x 2

## 2015-03-14 NOTE — ED Notes (Signed)
Pt reports sharp left sided chest pain that is constant. Pt states that it started at approx 1130. Pt denies ant associated symptoms and pain is not worsened or relieved by anything.

## 2015-04-04 ENCOUNTER — Emergency Department (HOSPITAL_COMMUNITY)
Admission: EM | Admit: 2015-04-04 | Discharge: 2015-04-04 | Disposition: A | Discharge status: Home / Self Care | Payer: No Typology Code available for payment source | Attending: Emergency Medicine | Admitting: Emergency Medicine

## 2015-04-04 ENCOUNTER — Encounter (HOSPITAL_COMMUNITY): Payer: Self-pay

## 2015-04-04 DIAGNOSIS — Z8619 Personal history of other infectious and parasitic diseases: Secondary | ICD-10-CM | POA: Diagnosis not present

## 2015-04-04 DIAGNOSIS — Z7952 Long term (current) use of systemic steroids: Secondary | ICD-10-CM | POA: Insufficient documentation

## 2015-04-04 DIAGNOSIS — Y998 Other external cause status: Secondary | ICD-10-CM | POA: Insufficient documentation

## 2015-04-04 DIAGNOSIS — Y9389 Activity, other specified: Secondary | ICD-10-CM | POA: Insufficient documentation

## 2015-04-04 DIAGNOSIS — S3992XA Unspecified injury of lower back, initial encounter: Secondary | ICD-10-CM | POA: Diagnosis present

## 2015-04-04 DIAGNOSIS — Y92410 Unspecified street and highway as the place of occurrence of the external cause: Secondary | ICD-10-CM | POA: Insufficient documentation

## 2015-04-04 DIAGNOSIS — Z720 Tobacco use: Secondary | ICD-10-CM | POA: Diagnosis not present

## 2015-04-04 MED ORDER — NAPROXEN SODIUM 220 MG PO TABS: 440.0000 mg | ORAL_TABLET | Freq: Two times a day (BID) | ORAL | Status: DC | PRN

## 2015-04-04 MED ORDER — METHOCARBAMOL 500 MG PO TABS: 500.0000 mg | ORAL_TABLET | Freq: Two times a day (BID) | ORAL | Status: DC
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
## 2015-04-04 NOTE — Discharge Instructions (Signed)

## 2015-05-08 ENCOUNTER — Inpatient Hospital Stay (HOSPITAL_COMMUNITY)
Admission: EM | Admit: 2015-05-08 | Discharge: 2015-05-08 | DRG: 312 | Discharge status: Against Medical Advice | Payer: Self-pay | Attending: Internal Medicine | Admitting: Internal Medicine

## 2015-05-08 ENCOUNTER — Encounter (HOSPITAL_COMMUNITY): Payer: Self-pay | Admitting: *Deleted

## 2015-05-08 ENCOUNTER — Emergency Department (HOSPITAL_COMMUNITY): Payer: Self-pay

## 2015-05-08 DIAGNOSIS — R7989 Other specified abnormal findings of blood chemistry: Secondary | ICD-10-CM

## 2015-05-08 DIAGNOSIS — R55 Syncope and collapse: Principal | ICD-10-CM | POA: Diagnosis present

## 2015-05-08 DIAGNOSIS — B1920 Unspecified viral hepatitis C without hepatic coma: Secondary | ICD-10-CM | POA: Diagnosis present

## 2015-05-08 DIAGNOSIS — R42 Dizziness and giddiness: Secondary | ICD-10-CM

## 2015-05-08 DIAGNOSIS — F17210 Nicotine dependence, cigarettes, uncomplicated: Secondary | ICD-10-CM | POA: Diagnosis present

## 2015-05-08 DIAGNOSIS — R945 Abnormal results of liver function studies: Secondary | ICD-10-CM

## 2015-05-08 LAB — BASIC METABOLIC PANEL
ANION GAP: 10 (ref 5–15)
BUN: 10 mg/dL (ref 6–20)
CALCIUM: 8.5 mg/dL — AB (ref 8.9–10.3)
CO2: 22 mmol/L (ref 22–32)
Chloride: 106 mmol/L (ref 101–111)
Creatinine, Ser: 0.8 mg/dL (ref 0.61–1.24)
Glucose, Bld: 132 mg/dL — ABNORMAL HIGH (ref 65–99)
POTASSIUM: 3.8 mmol/L (ref 3.5–5.1)
SODIUM: 138 mmol/L (ref 135–145)

## 2015-05-08 LAB — CBC
HCT: 40.7 % (ref 39.0–52.0)
Hemoglobin: 14 g/dL (ref 13.0–17.0)
MCH: 32.5 pg (ref 26.0–34.0)
MCHC: 34.4 g/dL (ref 30.0–36.0)
MCV: 94.4 fL (ref 78.0–100.0)
Platelets: 141 10*3/uL — ABNORMAL LOW (ref 150–400)
RBC: 4.31 MIL/uL (ref 4.22–5.81)
RDW: 13.3 % (ref 11.5–15.5)
WBC: 4.5 10*3/uL (ref 4.0–10.5)

## 2015-05-08 LAB — HEPATIC FUNCTION PANEL
ALT: 180 U/L — AB (ref 17–63)
AST: 361 U/L — ABNORMAL HIGH (ref 15–41)
Albumin: 3.1 g/dL — ABNORMAL LOW (ref 3.5–5.0)
Alkaline Phosphatase: 128 U/L — ABNORMAL HIGH (ref 38–126)
BILIRUBIN DIRECT: 3.2 mg/dL — AB (ref 0.1–0.5)
Indirect Bilirubin: 1.9 mg/dL — ABNORMAL HIGH (ref 0.3–0.9)
Total Bilirubin: 5.1 mg/dL — ABNORMAL HIGH (ref 0.3–1.2)
Total Protein: 7.1 g/dL (ref 6.5–8.1)

## 2015-05-08 LAB — AMMONIA: Ammonia: 72 umol/L — ABNORMAL HIGH (ref 9–35)

## 2015-05-08 LAB — I-STAT TROPONIN, ED: Troponin i, poc: 0 ng/mL (ref 0.00–0.08)

## 2015-05-08 LAB — PROTIME-INR
INR: 1.07 (ref 0.00–1.49)
Prothrombin Time: 14.1 seconds (ref 11.6–15.2)

## 2015-05-08 LAB — ETHANOL: Alcohol, Ethyl (B): 97 mg/dL — ABNORMAL HIGH (ref <5)

## 2015-05-08 MED ORDER — SODIUM CHLORIDE 0.9 % IV BOLUS (SEPSIS)
1000.0000 mL | Freq: Once | INTRAVENOUS | Status: AC
  Administered 2015-05-08: 1000 mL via INTRAVENOUS
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
## 2015-05-08 NOTE — ED Notes (Signed)
Pt made aware he had a bed assignment and was going up soon. Pt requests to sign out AMA and does not want any further tests. PA mintz at bedside to explain risks of leaving and not receiving further tests and treatment which include serious injury or death. Pt still wishes to leave.

## 2015-05-08 NOTE — ED Provider Notes (Signed)
CSN: [id number]     Arrival date & time 05/08/15  1557 History   First MD Initiated Contact with Patient 05/08/15 1818     Chief Complaint  Patient presents with  . Dizziness  . Shortness of Breath  . Chest Pain  . Near Syncope     (Consider location/radiation/quality/duration/timing/severity/associated sxs/prior Treatment) HPI Peter Nelson is a 64 year old male who presents the ER with multiple complaints. Patient states for 3 months he has been experiencing intermittent left sided sharp chest pain. He states there are no aggravating or alleviating factors of this pain. Patient states the pain comes and goes randomly, sometimes happens at sleep sometimes happens when he is walking. Patient states over the last week he has noticed gradual onset of lightheadedness which he describes as feeling like he is going to pass out. Patient states this symptom has been present, worse with standing or walking. She states on Monday, 4 days ago he had a syncopal episode while walking outside. Patient states he has not suffered any injury from this, states this has not happened since, however the dizziness has persisted. Patient describes associated shortness of breath with walking. Patient states he drank almost no water every day, drinks approximately one pint of liquor daily. Patient denies headache, blurred vision, room spinning sensation, headache, fever, nausea, vomiting, abdominal pain, dysuria.  Past Medical History  Diagnosis Date  . Hepatitis C    History reviewed. No pertinent past surgical history. History reviewed. No pertinent family history. History  Substance Use Topics  . Smoking status: Current Every Day Smoker -- 0.50 packs/day    Types: Cigarettes  . Smokeless tobacco: Never Used  . Alcohol Use: Yes     Comment: "a pint and half a day"    Review of Systems  Constitutional: Negative for fever.  HENT: Negative for trouble swallowing.   Eyes: Negative for visual disturbance.   Respiratory: Negative for shortness of breath.   Cardiovascular: Negative for chest pain.  Gastrointestinal: Negative for nausea, vomiting and abdominal pain.  Genitourinary: Negative for dysuria.  Musculoskeletal: Negative for neck pain.  Skin: Negative for rash.  Neurological: Positive for syncope and light-headedness. Negative for dizziness, weakness and numbness.  Psychiatric/Behavioral: Negative.       Allergies  Aspirin  Home Medications   Prior to Admission medications   Medication Sig Start Date End Date Taking? Authorizing Provider  naproxen sodium (ALEVE) 220 MG tablet Take 2 tablets (440 mg total) by mouth 2 (two) times daily as needed (for pain). 04/04/15  Yes Domenic Moras, PA-C  hydrOXYzine (ATARAX/VISTARIL) 25 MG tablet Take 1 tablet (25 mg total) by mouth every 6 (six) hours. 01/06/15   Melony Overly, MD   BP 136/80 mmHg  Pulse 74  Temp(Src) 98.3 F (36.8 C) (Oral)  Resp 21  Ht 5' 7"  (1.702 m)  Wt 138 lb (62.596 kg)  BMI 21.61 kg/m2  SpO2 100% Physical Exam  Constitutional: He is oriented to person, place, and time. He appears well-developed and well-nourished. No distress.  HENT:  Head: Normocephalic and atraumatic.  Mouth/Throat: Oropharynx is clear and moist. No oropharyngeal exudate.  Eyes: Right eye exhibits no discharge. Left eye exhibits no discharge. No scleral icterus.  Neck: Normal range of motion.  Cardiovascular: Normal rate, regular rhythm and normal heart sounds.   No murmur heard. Pulmonary/Chest: Effort normal and breath sounds normal. No respiratory distress.  Abdominal: Soft. There is no tenderness.  Musculoskeletal: Normal range of motion. He exhibits no edema or tenderness.  Neurological: He is alert and oriented to person, place, and time. He has normal strength. No cranial nerve deficit or sensory deficit. He displays a negative Romberg sign. Coordination and gait normal. GCS eye subscore is 4. GCS verbal subscore is 5. GCS motor subscore  is 6.  Patient fully alert, answering questions appropriately in full, clear sentences. Cranial nerves II through XII grossly intact. Motor strength 5 out of 5 in all major muscle groups of upper and lower extremities. Distal sensation intact.   Skin: Skin is warm and dry. No rash noted. He is not diaphoretic.  Psychiatric: He has a normal mood and affect.  Nursing note and vitals reviewed.   ED Course  Procedures (including critical care time) Labs Review Labs Reviewed  BASIC METABOLIC PANEL - Abnormal; Notable for the following:    Glucose, Bld 132 (*)    Calcium 8.5 (*)    All other components within normal limits  CBC - Abnormal; Notable for the following:    Platelets 141 (*)    All other components within normal limits  HEPATIC FUNCTION PANEL - Abnormal; Notable for the following:    Albumin 3.1 (*)    AST 361 (*)    ALT 180 (*)    Alkaline Phosphatase 128 (*)    Total Bilirubin 5.1 (*)    Bilirubin, Direct 3.2 (*)    Indirect Bilirubin 1.9 (*)    All other components within normal limits  AMMONIA - Abnormal; Notable for the following:    Ammonia 72 (*)    All other components within normal limits  ETHANOL - Abnormal; Notable for the following:    Alcohol, Ethyl (B) 97 (*)    All other components within normal limits  PROTIME-INR  URINE RAPID DRUG SCREEN, HOSP PERFORMED  I-STAT TROPOININ, ED    Imaging Review Dg Chest 2 View  05/08/2015   CLINICAL DATA:  Chest pain.  EXAM: CHEST  2 VIEW  COMPARISON:  Mar 14, 2015.  FINDINGS: The heart size and mediastinal contours are within normal limits. Both lungs are clear. No pneumothorax or pleural effusion is noted. The visualized skeletal structures are unremarkable.  IMPRESSION: No active cardiopulmonary disease.   Electronically Signed   By: Marijo Conception, M.D.   On: 05/08/2015 16:41     EKG Interpretation   Date/Time:  Thursday May 08 2015 16:02:43 EDT Ventricular Rate:  93 PR Interval:  146 QRS Duration: 70 QT  Interval:  366 QTC Calculation: 455 R Axis:   78 Text Interpretation:  Normal sinus rhythm Normal ECG Confirmed by  PICKERING  MD, Ovid Curd [phone]) on 05/08/2015 8:16:40 PM      MDM   Final diagnoses:  Dizziness  Syncope, unspecified syncope type    Workup here shows mildly elevated ammonia and liver enzymes. Patient without abdominal pain. Abdominal exam is benign. No concern for acute abdomen. Orthostatics negative, neurologic exam is benign. Based on patient's age, risk factors patient was recommended to be admitted to medicine for further evaluation of his syncope, dizziness and chest discomfort. I informed patient of this, and patient states he does not wish to stay in the hospital. I discussed risks/benefits with patient to include but not limited to him having worsening of his symptoms, severe bodily injury and death, patient remains adamant in his decision. Patient is neurologically intact, fully alert and oriented 4, clinically sober and able to make decisions for himself. I asked patient to sign out [place] if he were to  leave, patient agrees to sign out.  Signed,  Dahlia Bailiff, PA-C 10:45 PM  Patient discussed with Dr. Davonna Belling, MD  Dahlia Bailiff, PA-C 05/08/15 2246  Davonna Belling, MD 05/08/15 [phone]

## 2015-05-12 ENCOUNTER — Encounter (HOSPITAL_COMMUNITY): Payer: Self-pay | Admitting: Emergency Medicine

## 2015-05-12 ENCOUNTER — Emergency Department (HOSPITAL_COMMUNITY)
Admission: EM | Admit: 2015-05-12 | Discharge: 2015-05-12 | Discharge status: Absent without leave | Payer: Medicaid Other | Source: Home / Self Care

## 2015-05-12 ENCOUNTER — Emergency Department (HOSPITAL_COMMUNITY)
Admission: EM | Admit: 2015-05-12 | Discharge: 2015-05-12 | Discharge status: Against Medical Advice | Payer: Medicaid Other | Attending: Emergency Medicine | Admitting: Emergency Medicine

## 2015-05-12 DIAGNOSIS — Z09 Encounter for follow-up examination after completed treatment for conditions other than malignant neoplasm: Secondary | ICD-10-CM | POA: Insufficient documentation

## 2015-05-12 DIAGNOSIS — Z720 Tobacco use: Secondary | ICD-10-CM | POA: Insufficient documentation

## 2015-05-12 NOTE — ED Notes (Signed)
The patient was seen here friday for weakness, dizziness and chest pain.  He left AMA and was told if his symptoms got worse to come back.  He has no symptoms today but is reluctantly checking in because his finacee wants him to be seen.  He denies any pain.

## 2015-05-12 NOTE — ED Notes (Signed)
Pt called for room in FT. No answer.

## 2015-06-10 ENCOUNTER — Emergency Department (INDEPENDENT_AMBULATORY_CARE_PROVIDER_SITE_OTHER)
Admission: EM | Admit: 2015-06-10 | Discharge: 2015-06-10 | Disposition: A | Discharge status: Home / Self Care | Payer: Self-pay | Source: Home / Self Care | Attending: Emergency Medicine | Admitting: Emergency Medicine

## 2015-06-10 ENCOUNTER — Encounter (HOSPITAL_COMMUNITY): Payer: Self-pay | Admitting: Emergency Medicine

## 2015-06-10 DIAGNOSIS — R55 Syncope and collapse: Secondary | ICD-10-CM

## 2015-06-10 DIAGNOSIS — R040 Epistaxis: Secondary | ICD-10-CM

## 2015-06-10 DIAGNOSIS — R1011 Right upper quadrant pain: Secondary | ICD-10-CM

## 2015-06-10 DIAGNOSIS — R17 Unspecified jaundice: Secondary | ICD-10-CM

## 2015-06-10 NOTE — Discharge Instructions (Signed)
I am concerned about your liver. We drew blood work today. Please go to the emergency room first thing tomorrow morning for additional evaluation. You may need to be admitted. Please stop drinking alcohol.

## 2015-06-10 NOTE — ED Provider Notes (Addendum)
CSN: [id number]     Arrival date & time 06/10/15  1652 History   First MD Initiated Contact with Patient 06/10/15 1810     Chief Complaint  Patient presents with  . Hepatitis C   (Consider location/radiation/quality/duration/timing/severity/associated sxs/prior Treatment) HPI  [name] here with multiple concerns. He states that over the last month his eyes have turned yellow. He also reports dark orange urine and sometimes his stool is green colored. He also states that he will get random nosebleeds. He has also been having syncopal episodes. Last episode was 3 weeks ago. He denies associated palpitations or chest pain. Episodes last seconds to minutes. He reports feeling tired and sleeping a lot. He denies any abdominal pain, nausea, vomiting. He states he was diagnosed with hepatitis C when he was in prison. He drinks at least 2 pints of gin a week. Last alcoholic drink was this morning.  Past Medical History  Diagnosis Date  . Hepatitis C    History reviewed. No pertinent past surgical history. No family history on file. Social History  Substance Use Topics  . Smoking status: Current Every Day Smoker -- 0.50 packs/day    Types: Cigarettes  . Smokeless tobacco: Never Used  . Alcohol Use: Yes     Comment: "a pint and half a day"    Review of Systems As in history of present illness Allergies  Aspirin  Home Medications   Prior to Admission medications   Medication Sig Start Date End Date Taking? Authorizing Provider  hydrOXYzine (ATARAX/VISTARIL) 25 MG tablet Take 1 tablet (25 mg total) by mouth every 6 (six) hours. 01/06/15   Melony Overly, MD  naproxen sodium (ALEVE) 220 MG tablet Take 2 tablets (440 mg total) by mouth 2 (two) times daily as needed (for pain). 04/04/15   Domenic Moras, PA-C   BP 154/80 mmHg  Pulse 69  Temp(Src) 98.4 F (36.9 C) (Oral)  Resp 20  SpO2 99% Physical Exam  Constitutional: He is oriented to person, place, and time. He appears  well-developed and well-nourished. No distress.  Eyes: Scleral icterus is present.  Neck: Neck supple.  Cardiovascular: Normal rate, regular rhythm and normal heart sounds.   No murmur heard. Pulmonary/Chest: Effort normal and breath sounds normal. No respiratory distress. He has no wheezes. He has no rales.  Abdominal: Soft. Bowel sounds are normal. He exhibits no distension. There is tenderness ( IN RIGHT UPPER QUADRANT ). There is no rebound and no guarding.  Neurological: He is alert and oriented to person, place, and time.  Skin: Skin is warm and dry.    ED Course  Procedures (including critical care time) Labs Review Labs Reviewed  COMPREHENSIVE METABOLIC PANEL  PROTIME-INR  HEPATITIS PANEL, ACUTE  CBC WITH DIFFERENTIAL/PLATELET  AMMONIA    Imaging Review No results found.   MDM   1. Jaundice   2. RUQ abdominal pain   3. Syncope, unspecified syncope type   4. Bleeding nose    I am concerned about his liver.  He was seen in the emergency room last month and lab work showed elevated liver enzymes, elevated bilirubin, elevated ammonium. At that time his INR was normal. Discussed with patient that I believe he needs additional workup in the emergency room and likely admission given his jaundice and syncope. He does not want to go to the hospital tonight. He states he will go first thing in the morning. His fiance is in the room and states she will  get him to the emergency room tomorrow morning. In the meantime, I have ordered blood work to evaluate his liver function.  We were unable to collect any blood.    Melony Overly, MD 06/10/15 [place] Keyandre Pileggi, MD 06/10/15 [id number]

## 2015-06-10 NOTE — ED Notes (Signed)
Patient reports he was diagnosed 5 years ago while in prison.  No pcp, no regular treatment.

## 2015-06-11 ENCOUNTER — Telehealth: Payer: Self-pay

## 2015-06-11 ENCOUNTER — Encounter (HOSPITAL_COMMUNITY): Payer: Self-pay | Admitting: Emergency Medicine

## 2015-06-11 ENCOUNTER — Emergency Department (HOSPITAL_COMMUNITY)
Admission: EM | Admit: 2015-06-11 | Discharge: 2015-06-11 | Disposition: A | Discharge status: Home / Self Care | Payer: Medicaid Other | Attending: Emergency Medicine | Admitting: Emergency Medicine

## 2015-06-11 DIAGNOSIS — R160 Hepatomegaly, not elsewhere classified: Secondary | ICD-10-CM | POA: Insufficient documentation

## 2015-06-11 DIAGNOSIS — E7220 Disorder of urea cycle metabolism, unspecified: Secondary | ICD-10-CM | POA: Insufficient documentation

## 2015-06-11 DIAGNOSIS — Z79899 Other long term (current) drug therapy: Secondary | ICD-10-CM | POA: Insufficient documentation

## 2015-06-11 DIAGNOSIS — R748 Abnormal levels of other serum enzymes: Secondary | ICD-10-CM | POA: Insufficient documentation

## 2015-06-11 DIAGNOSIS — Z720 Tobacco use: Secondary | ICD-10-CM | POA: Insufficient documentation

## 2015-06-11 DIAGNOSIS — R17 Unspecified jaundice: Secondary | ICD-10-CM | POA: Insufficient documentation

## 2015-06-11 DIAGNOSIS — Z8619 Personal history of other infectious and parasitic diseases: Secondary | ICD-10-CM | POA: Insufficient documentation

## 2015-06-11 LAB — COMPREHENSIVE METABOLIC PANEL
ALBUMIN: 2.5 g/dL — AB (ref 3.5–5.0)
ALK PHOS: 164 U/L — AB (ref 38–126)
ALT: 151 U/L — ABNORMAL HIGH (ref 17–63)
AST: 280 U/L — ABNORMAL HIGH (ref 15–41)
Anion gap: 9 (ref 5–15)
BUN: 8 mg/dL (ref 6–20)
CO2: 25 mmol/L (ref 22–32)
Calcium: 8.6 mg/dL — ABNORMAL LOW (ref 8.9–10.3)
Chloride: 104 mmol/L (ref 101–111)
Creatinine, Ser: 0.95 mg/dL (ref 0.61–1.24)
GFR calc Af Amer: >60 mL/min (ref >60)
GFR calc non Af Amer: >60 mL/min (ref >60)
GLUCOSE: 87 mg/dL (ref 65–99)
POTASSIUM: 4.1 mmol/L (ref 3.5–5.1)
SODIUM: 138 mmol/L (ref 135–145)
Total Bilirubin: 8.9 mg/dL — ABNORMAL HIGH (ref 0.3–1.2)
Total Protein: 7.6 g/dL (ref 6.5–8.1)

## 2015-06-11 LAB — URINALYSIS, ROUTINE W REFLEX MICROSCOPIC
GLUCOSE, UA: NEGATIVE mg/dL
Hgb urine dipstick: NEGATIVE
KETONES UR: NEGATIVE mg/dL
NITRITE: NEGATIVE
PROTEIN: NEGATIVE mg/dL
Specific Gravity, Urine: 1.017 (ref 1.005–1.030)
Urobilinogen, UA: 4 mg/dL — ABNORMAL HIGH (ref 0.0–1.0)
pH: 6.5 (ref 5.0–8.0)

## 2015-06-11 LAB — RAPID URINE DRUG SCREEN, HOSP PERFORMED
Amphetamines: NOT DETECTED
Barbiturates: NOT DETECTED
Benzodiazepines: NOT DETECTED
COCAINE: NOT DETECTED
OPIATES: NOT DETECTED
Tetrahydrocannabinol: NOT DETECTED

## 2015-06-11 LAB — CBC WITH DIFFERENTIAL/PLATELET
BASOS ABS: 0.1 10*3/uL (ref 0.0–0.1)
BASOS PCT: 1 % (ref 0–1)
Eosinophils Absolute: 0.2 10*3/uL (ref 0.0–0.7)
Eosinophils Relative: 4 % (ref 0–5)
HEMATOCRIT: 36.1 % — AB (ref 39.0–52.0)
HEMOGLOBIN: 12.7 g/dL — AB (ref 13.0–17.0)
Lymphocytes Relative: 40 % (ref 12–46)
Lymphs Abs: 2 10*3/uL (ref 0.7–4.0)
MCH: 33.8 pg (ref 26.0–34.0)
MCHC: 35.2 g/dL (ref 30.0–36.0)
MCV: 96 fL (ref 78.0–100.0)
Monocytes Absolute: 0.6 10*3/uL (ref 0.1–1.0)
Monocytes Relative: 12 % (ref 3–12)
NEUTROS ABS: 2.2 10*3/uL (ref 1.7–7.7)
NEUTROS PCT: 43 % (ref 43–77)
Platelets: 168 10*3/uL (ref 150–400)
RBC: 3.76 MIL/uL — ABNORMAL LOW (ref 4.22–5.81)
RDW: 17.4 % — AB (ref 11.5–15.5)
WBC: 5 10*3/uL (ref 4.0–10.5)

## 2015-06-11 LAB — URINE MICROSCOPIC-ADD ON

## 2015-06-11 LAB — LIPASE, BLOOD: Lipase: 43 U/L (ref 22–51)

## 2015-06-11 LAB — PROTIME-INR
INR: 1.25 (ref 0.00–1.49)
Prothrombin Time: 15.9 seconds — ABNORMAL HIGH (ref 11.6–15.2)

## 2015-06-11 LAB — ETHANOL: ALCOHOL ETHYL (B): 10 mg/dL — AB (ref <5)

## 2015-06-11 LAB — APTT: aPTT: 32 seconds (ref 24–37)

## 2015-06-11 LAB — AMMONIA: Ammonia: 56 umol/L — ABNORMAL HIGH (ref 9–35)

## 2015-06-11 MED ORDER — SODIUM CHLORIDE 0.9 % IV BOLUS (SEPSIS)
1000.0000 mL | Freq: Once | INTRAVENOUS | Status: AC
  Administered 2015-06-11: 1000 mL via INTRAVENOUS

## 2015-06-11 MED ORDER — LACTULOSE 10 G PO PACK: 10.0000 g | PACK | Freq: Three times a day (TID) | ORAL | Status: DC

## 2015-06-11 MED ORDER — LACTULOSE 10 GM/15ML PO SOLN
10.0000 g | Freq: Once | ORAL | Status: AC
  Administered 2015-06-11: 10 g via ORAL
  Filled 2015-06-11: qty 15

## 2015-06-11 NOTE — ED Provider Notes (Signed)
CSN: [id number]     Arrival date & time 06/11/15  1019 History   First MD Initiated Contact with Patient 06/11/15 1029     Chief Complaint  Patient presents with  . Fatigue     (Consider location/radiation/quality/duration/timing/severity/associated sxs/prior Treatment) HPI   Peter Nelson is a 64 y.o. male  with a hx of hepatitis C, EtOH abuse presents to the Emergency Department complaining of gradual, persistent, progressively worsening fatigue onset 3 months ago. Associated symptoms include scleral icterus, dark urine, green diarrhea, decreased PO intake and intermittent syncope and epistaxis.  Nothing makes it better and Nothing makes it worse.  Patient denies chest pain, shortness of breath, nausea or vomiting with syncopal episodes. Pt denies fever, chills, headache, neck pain, chest pain, shortness of breath, abdominal pain, vomiting, dysuria.  Patient reports he drinks 2-3 pints of June per week. He reports his last drink was yesterday where he drank an entire pint.  Pt reports that while in jail he was treated for Hepatitis C but was released 9 months ago and he could not obtain follow-up for financial reasons.    Past Medical History  Diagnosis Date  . Hepatitis C    History reviewed. No pertinent past surgical history. History reviewed. No pertinent family history. Social History  Substance Use Topics  . Smoking status: Current Every Day Smoker -- 0.50 packs/day    Types: Cigarettes  . Smokeless tobacco: Never Used  . Alcohol Use: Yes     Comment: "a pint and half a day"    Review of Systems  Constitutional: Positive for fatigue. Negative for fever, diaphoresis, appetite change and unexpected weight change.  HENT: Negative for mouth sores.   Eyes: Negative for visual disturbance.  Respiratory: Negative for cough, chest tightness, shortness of breath and wheezing.   Cardiovascular: Negative for chest pain.  Gastrointestinal: Negative for nausea, vomiting, abdominal  pain, diarrhea and constipation.  Endocrine: Negative for polydipsia, polyphagia and polyuria.  Genitourinary: Negative for dysuria, urgency, frequency and hematuria.       Dark urine   Musculoskeletal: Negative for back pain and neck stiffness.  Skin: Positive for color change. Negative for rash.  Allergic/Immunologic: Negative for immunocompromised state.  Neurological: Positive for light-headedness. Negative for syncope and headaches.       Increased falls  Hematological: Does not bruise/bleed easily.  Psychiatric/Behavioral: Negative for sleep disturbance. The patient is not nervous/anxious.       Allergies  Aspirin  Home Medications   Prior to Admission medications   Medication Sig Start Date End Date Taking? Authorizing Provider  diphenhydrAMINE (BENADRYL) 25 MG tablet Take 25 mg by mouth every 6 (six) hours as needed for itching.   Yes Historical Provider, MD  IRON PO Take 1 tablet by mouth daily.   Yes Historical Provider, MD  lactulose (CEPHULAC) 10 G packet Take 1 packet (10 g total) by mouth 3 (three) times daily. 06/11/15   Tyreisha Ungar, PA-C   BP 138/71 mmHg  Pulse 59  Temp(Src) 98.8 F (37.1 C) (Oral)  Resp 22  SpO2 100% Physical Exam  Constitutional: He appears well-developed and well-nourished. No distress.  Awake, alert, nontoxic appearance  HENT:  Head: Normocephalic and atraumatic.  Mouth/Throat: Oropharynx is clear and moist. Mucous membranes are dry. No oropharyngeal exudate.  Mucous membranes are dry  Eyes: Conjunctivae are normal. Scleral icterus is present.  Neck: Normal range of motion. Neck supple.  Cardiovascular: Normal rate, regular rhythm and intact distal pulses.   Pulmonary/Chest: Effort  normal and breath sounds normal. No respiratory distress. He has no wheezes.  Equal chest expansion  Abdominal: Soft. Bowel sounds are normal. He exhibits no mass. There is hepatomegaly. There is no tenderness. There is no rebound and no guarding.   Palpable liver with minimal soreness to palpation Abdomen is soft and otherwise nontender No guarding or peritoneal signs  Musculoskeletal: Normal range of motion. He exhibits no edema.  Neurological: He is alert.  Speech is clear and goal oriented Moves extremities without ataxia  Skin: Skin is warm and dry. He is not diaphoretic.  Psychiatric: He has a normal mood and affect.  Nursing note and vitals reviewed.   ED Course  Procedures (including critical care time) Labs Review Labs Reviewed  CBC WITH DIFFERENTIAL/PLATELET - Abnormal; Notable for the following:    RBC 3.76 (*)    Hemoglobin 12.7 (*)    HCT 36.1 (*)    RDW 17.4 (*)    All other components within normal limits  COMPREHENSIVE METABOLIC PANEL - Abnormal; Notable for the following:    Calcium 8.6 (*)    Albumin 2.5 (*)    AST 280 (*)    ALT 151 (*)    Alkaline Phosphatase 164 (*)    Total Bilirubin 8.9 (*)    All other components within normal limits  AMMONIA - Abnormal; Notable for the following:    Ammonia 56 (*)    All other components within normal limits  ETHANOL - Abnormal; Notable for the following:    Alcohol, Ethyl (B) 10 (*)    All other components within normal limits  URINALYSIS, ROUTINE W REFLEX MICROSCOPIC (NOT AT [facility]) - Abnormal; Notable for the following:    Color, Urine AMBER (*)    Bilirubin Urine LARGE (*)    Urobilinogen, UA 4.0 (*)    Leukocytes, UA TRACE (*)    All other components within normal limits  PROTIME-INR - Abnormal; Notable for the following:    Prothrombin Time 15.9 (*)    All other components within normal limits  URINE MICROSCOPIC-ADD ON - Abnormal; Notable for the following:    Casts WBC CAST (*)    All other components within normal limits  LIPASE, BLOOD  URINE RAPID DRUG SCREEN, HOSP PERFORMED  APTT    Imaging Review No results found. I have personally reviewed and evaluated these images and lab results as part of my medical decision-making.   EKG  Interpretation   Date/Time:  Wednesday June 11 2015 11:00:05 EDT Ventricular Rate:  67 PR Interval:  146 QRS Duration: 74 QT Interval:  436 QTC Calculation: 460 R Axis:   67 Text Interpretation:  Sinus rhythm Minimal ST elevation, inferior leads No  significant change since last tracing Confirmed by Maryan Rued  MD, Loree Fee  [phone]) on 06/11/2015 12:11:19 PM      MDM   Final diagnoses:  Serum ammonia increased  Hepatomegaly  Scleral icterus  Elevated liver enzymes   Peter Nelson presents with multiple symptoms worsening for the last 3 months including scleral icterus, elevated liver enzymes, decreased appetite, lightheadedness and general feelings of malaise.  Patient with a history of hepatitis C which has remained untreated for the last 9 months.  Pt reports he cannot afford follow-up.  Will check labs and give fluids.    1:59 PM Labs are improved from one month ago. No leukocytosis. Elevation in AST and ALT remain however improved. Ammonia is 56 and likely the cause of patient's fatigue. He continues to drink  alcohol with a level of 10 this morning. Increase in PT but no increase in INR. No epistaxis here in the emergency department. At this time patient is tolerating by mouth without difficulty. His labs are reassuring. Discussed with Case Management who will attempt to arrange f/u.  Patient will be given lactulose here in the emergency department.   3 PM Patient has follow-up appointment with [place] [facility] on September 1. He can be referred to gastroenterology after that appointment. Will be discharged home with lactulose to help decrease his ammonia. Strict return precautions given.  Labs and plan discussed with patient and wife who are comfortable with this and grateful for the help.  BP 138/71 mmHg  Pulse 59  Temp(Src) 98.8 F (37.1 C) (Oral)  Resp 22  SpO2 100%  The patient was discussed with and labs reviewed with Dr. Maryan Rued who agrees with the treatment  plan.    Jarrett Soho Adriana Quinby, PA-C 06/12/15 1031  Blanchie Dessert, MD 06/12/15 [phone]

## 2015-06-11 NOTE — ED Notes (Signed)
Pt reports sent over from urgent care for further evaluation of liver enzymes. Pt has jaundiced appearance, reports dark urine.  Pt reports feeling weak, dizzy, fatigued and falling over the past month. Pt states no PCP. Reports drinking 2-3 pints of gin per week. Last drink yesterday. Pt awake, alert, oriented x4, VSS.

## 2015-06-11 NOTE — Care Management Note (Signed)
Case Management Note  Patient Details  Name: Peter Nelson MRN: [id number] Date of Birth: [birth year]  Subjective/Objective:                  64 y.o. male with a hx of hepatitis C, EtOH abuse presents to the Emergency Department complaining of gradual, persistent, progressively worsening fatigue onset 3 months ago. //Home with spouse.  Action/Plan: Will check labs and give fluids.//Access for disposition needs  Expected Discharge Date:        06/11/2015          Expected Discharge Plan:  Home/Self Care  In-House Referral:     Discharge [facility], Follow-up appt scheduled, CM Consult  Post Acute Care Choice:  NA Choice offered to:  Patient  DME Arranged:    DME Agency:     HH Arranged:    [place] Agency:     Status of Service:  Completed, signed off  Medicare Important Message Given:    Date Medicare IM Given:    Medicare IM give by:    Date Additional Medicare IM Given:    Additional Medicare Important Message give by:     If discussed at [place] of Stay Meetings, dates discussed:    Additional Comments: Rowland Ericsson J. Clydene Laming, RN, BSN, [place] [phone] ED CM consulted regarding PCP establishment and insurance enrollment. Pt presented to [facility] ED today with fatigue, hx of hep C. NCM met with pt at bedside; pt confirms not having access to f/u care with PCP or insurance coverage. Discussed with patient importance and benefits of establishing PCP, and not utilizing the ED for primary care needs. Pt verbalized understanding and is in agreement. Discussed other options, provided list of local  affordable PCPs.  Pt voiced interest in the [facility] and [facility].  [facility] Brochure given with address, phone number, and the services highlighted. Instructed of appointment date 9/1 at 2:00pm.  Pt verbalized understanding.   Fuller Mandril, RN 06/11/2015, 2:49 PM

## 2015-06-11 NOTE — Telephone Encounter (Signed)
Call received from Fuller Mandril, [place] requesting a follow up appointment for the patient.  A visit was scheduled for 06/19/15 @ 1400 w/ Chari Manning, NP.  The appointment information was placed on the AVS.

## 2015-06-11 NOTE — Discharge Instructions (Signed)
1. Medications: lactulose, usual home medications 2. Treatment: rest, drink plenty of fluids,  3. Follow Up: Please followup with the [facility] at your appointment on Sept 1st for discussion of your diagnoses and further evaluation after today's visit; Please return to the ER for worsening symptoms  Hepatitis C Hepatitis C is a viral infection of the liver. Infection may go undetected for months or years because symptoms may be absent or very mild. Chronic liver disease is the main danger of hepatitis C. This may lead to scarring of the liver (cirrhosis), liver failure, and liver cancer. CAUSES  Hepatitis C is caused by the hepatitis C virus (HCV). Formerly, hepatitis C infections were most commonly transmitted through blood transfusions. In the early 1990s, routine testing of donated blood for hepatitis C and exclusion of blood that tests positive for HCV began. Now, HCV is most commonly transmitted from person to person through injection drug use, sharing needles, or sex with an infected person. A caregiver may also get the infection from exposure to the blood of an infected patient by way of a cut or needle stick.  SYMPTOMS  Acute Phase Many cases of acute HCV infection are mild and cause few problems.Some people may not even realize they are sick.Symptoms in others may last a few weeks to several months and include:  Feeling very tired.  Loss of appetite.  Nausea.  Vomiting.  Abdominal pain.  Dark yellow urine.  Yellow skin and eyes (jaundice).  Itching of the skin. Chronic Phase  Between 50% to 85% of people who get HCV infection become "chronic carriers." They often have no symptoms, but the virus stays in their body.They may spread the virus to others and can get long-term liver disease.  Many people with chronic HCV infection remain healthy for many years. However, up to 1 in 5 chronically infected people may develop severe liver diseases including scarring of the  liver (cirrhosis), liver failure, or liver cancer. DIAGNOSIS  Diagnosis of hepatitis C infection is made by testing blood for the presence of hepatitis C viral particles called RNA. Other tests may also be done to measure the status of current liver function, exclude other liver problems, or assess liver damage. TREATMENT  Treatment with many antiviral drugs is available and recommended for some patients with chronic HCV infection. Drug treatment is generally considered appropriate for patients who:  Are 65 years of age or older.  Have a positive test for HCV particles in the blood.  Have a liver tissue sample (biopsy) that shows chronic hepatitis and significant scarring (fibrosis).  Do not have signs of liver failure.  Have acceptable blood test results that confirm the wellness of other body organs.  Are willing to be treated and conform to treatment requirements.  Have no other circumstances that would prevent treatment from being recommended (contraindications). All people who are offered and choose to receive drug treatment must understand that careful medical follow up for many months and even years is crucial in order to make successful care possible. The goal of drug treatment is to eliminate any evidence of HCV in the blood on a long-term basis. This is called a "sustained virologic response" or SVR. Achieving a SVR is associated with a decrease in the chance of life-threatening liver problems, need for a liver transplant, liver cancer rates, and liver-related complications. Successful treatment currently requires taking treatment drugs for at least 24 weeks and up to 72 weeks. An injected drug (interferon) given weekly and an  oral antiviral medicine taken daily are usually prescribed. Side effects from these drugs are common and some may be very serious. Your response to treatment must be carefully monitored by both you and your caregiver throughout the entire treatment  period. PREVENTION There is no vaccine for hepatitis C. The only way to prevent the disease is to reduce the risk of exposure to the virus.   Avoid sharing drug needles or personal items like toothbrushes, razors, and nail clippers with an infected person.  Healthcare workers need to avoid injuries and wear appropriate protective equipment such as gloves, gowns, and face masks when performing invasive medical or nursing procedures. HOME CARE INSTRUCTIONS  To avoid making your liver disease worse:  Strictly avoid drinking alcohol.  Carefully review all new prescriptions of medicines with your caregiver. Ask your caregiver which drugs you should avoid. The following drugs are toxic to the liver, and your caregiver may tell you to avoid them:  Isoniazid.  Methyldopa.  Acetaminophen.  Anabolic steroids (muscle-building drugs).  Erythromycin.  Oral contraceptives (birth control pills).  Check with your caregiver to make sure medicine you are currently taking will not be harmful.  Periodic blood tests may be required. Follow your caregiver's advice about when you should have blood tests.  Avoid a sexual relationship until advised otherwise by your caregiver.  Avoid activities that could expose other people to your blood. Examples include sharing a toothbrush, nail clippers, razors, and needles.  Bed rest is not necessary, but it may make you feel better. Recovery time is not related to the amount of rest you receive.  This infection is contagious. Follow your caregiver's instructions in order to avoid spread of the infection. SEEK IMMEDIATE MEDICAL CARE IF:  You have increasing fatigue or weakness.  You have an oral temperature above 102 F (38.9 C), not controlled by medicine.  You develop loss of appetite, nausea, or vomiting.  You develop jaundice.  You develop easy bruising or bleeding.  You develop any severe problems as a result of your treatment. MAKE SURE YOU:    Understand these instructions.  Will watch your condition.  Will get help right away if you are not doing well or get worse. Document Released: 10/01/2000 Document Revised: 12/27/2011 Document Reviewed: 01/16/2014 [facility] Patient Information 2015 [place], [place]. This information is not intended to replace advice given to you by your health care provider. Make sure you discuss any questions you have with your health care provider.

## 2015-06-11 NOTE — ED Notes (Signed)
Patient given urinal and asked to provide a urine sample.

## 2015-06-19 ENCOUNTER — Ambulatory Visit: Payer: Medicaid Other | Admitting: Internal Medicine

## 2015-07-10 ENCOUNTER — Ambulatory Visit: Payer: Medicaid Other | Admitting: Family Medicine

## 2015-07-28 ENCOUNTER — Ambulatory Visit (INDEPENDENT_AMBULATORY_CARE_PROVIDER_SITE_OTHER): Payer: Self-pay | Admitting: Family Medicine

## 2015-07-28 ENCOUNTER — Encounter: Payer: Self-pay | Admitting: Family Medicine

## 2015-07-28 VITALS — BP 149/73 | HR 82 | Temp 98.3°F | Resp 16 | Ht 66.5 in | Wt 142.0 lb

## 2015-07-28 DIAGNOSIS — B1920 Unspecified viral hepatitis C without hepatic coma: Secondary | ICD-10-CM

## 2015-07-28 DIAGNOSIS — R21 Rash and other nonspecific skin eruption: Secondary | ICD-10-CM

## 2015-07-28 DIAGNOSIS — F1010 Alcohol abuse, uncomplicated: Secondary | ICD-10-CM | POA: Insufficient documentation

## 2015-07-28 DIAGNOSIS — R413 Other amnesia: Secondary | ICD-10-CM

## 2015-07-28 DIAGNOSIS — F17200 Nicotine dependence, unspecified, uncomplicated: Secondary | ICD-10-CM

## 2015-07-28 DIAGNOSIS — Z0000 Encounter for general adult medical examination without abnormal findings: Secondary | ICD-10-CM

## 2015-07-28 LAB — CBC WITH DIFFERENTIAL/PLATELET
BASOS PCT: 1 % (ref 0–1)
Basophils Absolute: 0 10*3/uL (ref 0.0–0.1)
EOS ABS: 0.1 10*3/uL (ref 0.0–0.7)
Eosinophils Relative: 3 % (ref 0–5)
HCT: 39.9 % (ref 39.0–52.0)
Hemoglobin: 13.3 g/dL (ref 13.0–17.0)
Lymphocytes Relative: 43 % (ref 12–46)
Lymphs Abs: 1.6 10*3/uL (ref 0.7–4.0)
MCH: 34.2 pg — AB (ref 26.0–34.0)
MCHC: 33.3 g/dL (ref 30.0–36.0)
MCV: 102.6 fL — ABNORMAL HIGH (ref 78.0–100.0)
MONO ABS: 0.5 10*3/uL (ref 0.1–1.0)
MONOS PCT: 13 % — AB (ref 3–12)
MPV: 11.2 fL (ref 8.6–12.4)
NEUTROS PCT: 40 % — AB (ref 43–77)
Neutro Abs: 1.5 10*3/uL — ABNORMAL LOW (ref 1.7–7.7)
PLATELETS: 116 10*3/uL — AB (ref 150–400)
RBC: 3.89 MIL/uL — AB (ref 4.22–5.81)
RDW: 13.2 % (ref 11.5–15.5)
WBC: 3.7 10*3/uL — AB (ref 4.0–10.5)

## 2015-07-28 LAB — COMPLETE METABOLIC PANEL WITH GFR
ALBUMIN: 3 g/dL — AB (ref 3.6–5.1)
ALK PHOS: 153 U/L — AB (ref 40–115)
ALT: 113 U/L — ABNORMAL HIGH (ref 9–46)
AST: 227 U/L — ABNORMAL HIGH (ref 10–35)
BILIRUBIN TOTAL: 4.5 mg/dL — AB (ref 0.2–1.2)
BUN: 9 mg/dL (ref 7–25)
CALCIUM: 8.5 mg/dL — AB (ref 8.6–10.3)
CO2: 26 mmol/L (ref 20–31)
Chloride: 103 mmol/L (ref 98–110)
Creat: 0.79 mg/dL (ref 0.70–1.25)
GFR, Est African American: >89 mL/min (ref >=60)
GLUCOSE: 100 mg/dL — AB (ref 65–99)
Potassium: 3.7 mmol/L (ref 3.5–5.3)
SODIUM: 138 mmol/L (ref 135–146)
TOTAL PROTEIN: 7 g/dL (ref 6.1–8.1)

## 2015-07-28 LAB — LIPID PANEL
CHOLESTEROL: 177 mg/dL (ref 125–200)
HDL: 59 mg/dL (ref >=40)
LDL Cholesterol: 94 mg/dL (ref <130)
Total CHOL/HDL Ratio: 3 Ratio (ref <=5.0)
Triglycerides: 122 mg/dL (ref <150)
VLDL: 24 mg/dL (ref <30)

## 2015-07-28 MED ORDER — HYDROCORTISONE 1 % EX LOTN: 1.0000 "application " | TOPICAL_LOTION | Freq: Two times a day (BID) | CUTANEOUS | Status: DC

## 2015-07-28 NOTE — Progress Notes (Signed)
Patient ID: ASHANTE SNELLING, male   DOB: Nov 14, 1950, 64 y.o.   MRN: [id number]  he Shihab States, is a 64 y.o. male  FIE:[id number]  ZYS:[id number]  DOB - [birth year]  CC:  Chief Complaint  Patient presents with  . Establish Care    problems with being stiff/ needs follow up for hep C/ wants something for excema/ balance off        HPI: Darrold Bezek is a 64 y.o. male here to establish care. The has a history of alcohol abuse. He currently drinks a pint of gin a day. His fiance, who is with him, is concerned about his memory, loss and lack of balance. He was seen in ED on 8/24 for sharp intermittent chest pain and dizziness and almost passing out. His girlfriend reports he has had several episodes of 'passing out' recently. She does report that this happens when he is drinking. She reports he fell last night.  He has a history of Hepatitis C and received some treatment about a year ago but has not had any follow-up. He also request something for ecezma. He smokes 1 1/2 packs of cigarettes a day and is not ready to quit. He denies drug use. Allergies  Allergen Reactions  . Aspirin Other (See Comments)    Unknown    Past Medical History  Diagnosis Date  . Hepatitis C    Current Outpatient Prescriptions on File Prior to Visit  Medication Sig Dispense Refill  . diphenhydrAMINE (BENADRYL) 25 MG tablet Take 25 mg by mouth every 6 (six) hours as needed for itching.    . IRON PO Take 1 tablet by mouth daily.    Marland Kitchen lactulose (CEPHULAC) 10 G packet Take 1 packet (10 g total) by mouth 3 (three) times daily. (Patient not taking: Reported on 07/28/2015) 30 each 0   No current facility-administered medications on file prior to visit.   Family History  Problem Relation Age of Onset  . Hypertension Mother   . Diabetes Mother   . Heart disease Mother    Social History   Social History  . Marital Status: Widowed    Spouse Name: [name]  . Number of Children: [name]  . Years of Education: [name]    Occupational History  . Not on file.   Social History Main Topics  . Smoking status: Current Every Day Smoker -- 1.00 packs/day    Types: Cigarettes  . Smokeless tobacco: Never Used  . Alcohol Use: Yes     Comment: "a pint a day"  . Drug Use: No  . Sexual Activity: Not on file   Other Topics Concern  . Not on file   Social History Narrative    Review of Systems: Constitutional: Negative for fever, chills, appetite change, weight loss. Positive for Fatigue. Skin: Positive for ecezma HENT: Negative for ear pain, ear discharge. Positive for decreased hearing and occassional nose bleeds Eyes: Negative for pain, discharge, redness. Positive for decreased visual accuity Neck: Negative for pain, stiffness Respiratory: Negative for cough, shortness of breath,   Cardiovascular: Negative for chest pain, palpitations and leg swelling. Positive for intermittent sharp chest pain. Gastrointestinal: Negative for abdominal pain, vomiting, diarrhea, constipations. Positive for some nausea Genitourinary: Negative for dysuria, urgency, frequency, hematuria,  Musculoskeletal: Negative  joint pain, joint  swelling, and gait problem.Negative for weakness. Positive for low back pain Neurological: Positive  for dizziness, tremors, imbalance, light-headedness.  Negative forseizures, syncope,  numbness and headaches.  Hematological: Negative for easy bruising or bleeding  Psychiatric/Behavioral: Positive for depression, anxiety, decreased concentration, Memory loss.   Objective:   Filed Vitals:   07/28/15 0934  BP: 149/73  Pulse: 82  Temp: 98.3 F (36.8 C)  Resp: 16    Physical Exam: Constitutional: Patient appears well-developed and well-nourished. No distress. HENT: Normocephalic, atraumatic, External right and left ear normal. Oropharynx is clear and moist.  Eyes: Conjunctivae and EOM are normal. PERRLA, no scleral icterus. Neck: Normal ROM. Neck supple. No lymphadenopathy, No  thyromegaly. CVS: RRR, S1/S2 +, no murmurs, no gallops, no rubs Pulmonary: Effort and breath sounds normal, no stridor, rhonchi, wheezes, rales.  Abdominal: Soft. Normoactive BS,, no distension, tenderness, rebound or guarding.  Musculoskeletal: Normal range of motion. No edema and no tenderness.  Neuro: Alert.Normal muscle tone coordination. Non-focal. Cranial nerves II-IIX intact, Romberg and Tandem gait shows some wobbling. Skin: Skin is warm and dry.Not diaphoretic. No erythema. No pallor.scattered papular rash on legs and trunk. Psychiatric: Normal mood and affect. Behavior, judgment, thought content normal.  Lab Results  Component Value Date   WBC 5.0 06/11/2015   HGB 12.7* 06/11/2015   HCT 36.1* 06/11/2015   MCV 96.0 06/11/2015   PLT 168 06/11/2015   Lab Results  Component Value Date   CREATININE 0.95 06/11/2015   BUN 8 06/11/2015   NA 138 06/11/2015   K 4.1 06/11/2015   CL 104 06/11/2015   CO2 25 06/11/2015    No results found for: HGBA1C Lipid Panel  No results found for: CHOL, TRIG, HDL, CHOLHDL, VLDL, LDLCALC     Assessment and plan:   1. Routine general medical examination at a health care facility  - COMPLETE METABOLIC PANEL WITH GFR - Ammonia - CBC with Differential - Lipid panel - PSA  2. Hepatitis C virus infection, unspecified chronicity - referral to Infections disease.  3. Ecezma -Hydrocortisone 1% lotion, 118 ml, to be applied bid, with 2 refills.  4. Memory loss -referral to neurologist    Return in about 6 months (around 01/26/2016).  The patient was given clear instructions to go to ER or return to medical center if symptoms don't improve, worsen or new problems develop. The patient verbalized understanding.    Micheline Chapman FNP  07/28/2015, 10:49 AM

## 2015-07-28 NOTE — Patient Instructions (Signed)
We will get referral to infectious disease Will refer to neurologist. Follow-up 3 months and as needed.

## 2015-07-29 LAB — PSA: PSA: 0.92 ng/mL (ref <=4.00)

## 2015-07-29 LAB — AMMONIA

## 2015-07-31 ENCOUNTER — Other Ambulatory Visit: Payer: Self-pay | Admitting: Family Medicine

## 2015-07-31 DIAGNOSIS — N184 Chronic kidney disease, stage 4 (severe): Secondary | ICD-10-CM

## 2015-08-01 ENCOUNTER — Other Ambulatory Visit: Payer: Medicaid Other

## 2015-08-18 ENCOUNTER — Other Ambulatory Visit: Payer: Self-pay

## 2015-08-18 DIAGNOSIS — B182 Chronic viral hepatitis C: Secondary | ICD-10-CM

## 2015-08-18 LAB — IRON: IRON: 209 ug/dL — AB (ref 50–180)

## 2015-08-18 LAB — HEPATITIS B CORE ANTIBODY, TOTAL: Hep B Core Total Ab: NONREACTIVE

## 2015-08-18 LAB — HEPATITIS B SURFACE ANTIBODY, QUANTITATIVE: Hepatitis B-Post: 261 m[IU]/mL

## 2015-08-18 LAB — HEPATITIS A ANTIBODY, TOTAL: Hep A Total Ab: REACTIVE — AB

## 2015-08-18 LAB — HIV ANTIBODY (ROUTINE TESTING W REFLEX): HIV 1&2 Ab, 4th Generation: NONREACTIVE

## 2015-08-18 LAB — HEPATITIS B SURFACE ANTIGEN: Hepatitis B Surface Ag: NEGATIVE

## 2015-08-19 LAB — HEPATITIS C RNA QUANTITATIVE
HCV QUANT LOG: 6.2 {Log} — AB (ref <1.18)
HCV QUANT: 1579038 [IU]/mL — AB (ref <15)

## 2015-08-19 LAB — ANA: ANA: NEGATIVE

## 2015-08-21 ENCOUNTER — Emergency Department (HOSPITAL_COMMUNITY): Payer: Medicaid Other

## 2015-08-21 ENCOUNTER — Encounter (HOSPITAL_COMMUNITY): Payer: Self-pay | Admitting: Emergency Medicine

## 2015-08-21 ENCOUNTER — Emergency Department (HOSPITAL_COMMUNITY)
Admission: EM | Admit: 2015-08-21 | Discharge: 2015-08-21 | Disposition: A | Discharge status: Home / Self Care | Payer: Medicaid Other | Attending: Emergency Medicine | Admitting: Emergency Medicine

## 2015-08-21 DIAGNOSIS — R531 Weakness: Secondary | ICD-10-CM | POA: Insufficient documentation

## 2015-08-21 DIAGNOSIS — Z720 Tobacco use: Secondary | ICD-10-CM | POA: Insufficient documentation

## 2015-08-21 DIAGNOSIS — R5383 Other fatigue: Secondary | ICD-10-CM | POA: Insufficient documentation

## 2015-08-21 DIAGNOSIS — R42 Dizziness and giddiness: Secondary | ICD-10-CM | POA: Insufficient documentation

## 2015-08-21 DIAGNOSIS — R251 Tremor, unspecified: Secondary | ICD-10-CM | POA: Insufficient documentation

## 2015-08-21 DIAGNOSIS — R0602 Shortness of breath: Secondary | ICD-10-CM | POA: Insufficient documentation

## 2015-08-21 DIAGNOSIS — Z8619 Personal history of other infectious and parasitic diseases: Secondary | ICD-10-CM | POA: Insufficient documentation

## 2015-08-21 DIAGNOSIS — R0789 Other chest pain: Secondary | ICD-10-CM

## 2015-08-21 LAB — CBC
HEMATOCRIT: 38.2 % — AB (ref 39.0–52.0)
Hemoglobin: 13.6 g/dL (ref 13.0–17.0)
MCH: 35.3 pg — ABNORMAL HIGH (ref 26.0–34.0)
MCHC: 35.6 g/dL (ref 30.0–36.0)
MCV: 99.2 fL (ref 78.0–100.0)
PLATELETS: 97 10*3/uL — AB (ref 150–400)
RBC: 3.85 MIL/uL — ABNORMAL LOW (ref 4.22–5.81)
RDW: 12.9 % (ref 11.5–15.5)
WBC: 3.9 10*3/uL — AB (ref 4.0–10.5)

## 2015-08-21 LAB — URINALYSIS, ROUTINE W REFLEX MICROSCOPIC
Glucose, UA: NEGATIVE mg/dL
HGB URINE DIPSTICK: NEGATIVE
KETONES UR: NEGATIVE mg/dL
Leukocytes, UA: NEGATIVE
NITRITE: NEGATIVE
PH: 7.5 (ref 5.0–8.0)
Protein, ur: NEGATIVE mg/dL
Specific Gravity, Urine: 1.013 (ref 1.005–1.030)
UROBILINOGEN UA: 2 mg/dL — AB (ref 0.0–1.0)

## 2015-08-21 LAB — PROTIME-INR
INR: 1.18 (ref 0.00–1.49)
Prothrombin Time: 15.1 seconds (ref 11.6–15.2)

## 2015-08-21 LAB — COMPREHENSIVE METABOLIC PANEL
ALBUMIN: 2.6 g/dL — AB (ref 3.5–5.0)
ALT: 91 U/L — ABNORMAL HIGH (ref 17–63)
ANION GAP: 10 (ref 5–15)
AST: 197 U/L — AB (ref 15–41)
Alkaline Phosphatase: 153 U/L — ABNORMAL HIGH (ref 38–126)
BILIRUBIN TOTAL: 4 mg/dL — AB (ref 0.3–1.2)
BUN: <5 mg/dL — ABNORMAL LOW (ref 6–20)
CHLORIDE: 105 mmol/L (ref 101–111)
CO2: 24 mmol/L (ref 22–32)
Calcium: 8.6 mg/dL — ABNORMAL LOW (ref 8.9–10.3)
Creatinine, Ser: 0.94 mg/dL (ref 0.61–1.24)
GFR calc Af Amer: >60 mL/min (ref >60)
GFR calc non Af Amer: >60 mL/min (ref >60)
GLUCOSE: 113 mg/dL — AB (ref 65–99)
POTASSIUM: 3.9 mmol/L (ref 3.5–5.1)
Sodium: 139 mmol/L (ref 135–145)
TOTAL PROTEIN: 7.7 g/dL (ref 6.5–8.1)

## 2015-08-21 LAB — I-STAT TROPONIN, ED
TROPONIN I, POC: 0.01 ng/mL (ref 0.00–0.08)
Troponin i, poc: 0.01 ng/mL (ref 0.00–0.08)

## 2015-08-21 LAB — HEPATITIS C GENOTYPE

## 2015-08-21 LAB — APTT: APTT: 36 s (ref 24–37)

## 2015-08-21 LAB — MAGNESIUM: MAGNESIUM: 1.9 mg/dL (ref 1.7–2.4)

## 2015-08-21 MED ORDER — NITROGLYCERIN 0.4 MG SL SUBL: 0.4000 mg | SUBLINGUAL_TABLET | SUBLINGUAL | Status: DC | PRN

## 2015-08-21 NOTE — ED Notes (Signed)
Pt here with c/o sob and chest pain that is off and on ,started yesterday pt is also c/o dizziness and starts to shake in his legs with the chest pain

## 2015-08-21 NOTE — ED Notes (Signed)
Patient brought back to room via wheelchair by Otila Kluver, NT; patient undressed, in gown, on monitor, continuous pulse oximetry and blood pressure cuff; visitor at bedside

## 2015-08-21 NOTE — Discharge Instructions (Signed)

## 2015-08-21 NOTE — ED Provider Notes (Signed)
CSN: [id number]     Arrival date & time 08/21/15  5170 History   First MD Initiated Contact with Patient 08/21/15 [phone]     Chief Complaint  Patient presents with  . Chest Pain     (Consider location/radiation/quality/duration/timing/severity/associated sxs/prior Treatment) HPI   Peter Nelson is a 64 y.o. male with PMH significant for hepatitis C who presents with chest pain and dizziness.  Patient reports he began experiencing sharp left sided, non-radiating, constant chest pain approximately 1 hour ago.  Associated symptoms include SOB, fatigue, weakness, and lower extremity shaking.  Denies fever, chills, diaphoresis, abdominal pain, N/V, syncope, or urinary symptoms.  Endorses ETOH use, a pint a day, last use was this morning.  Tobacco use, 0.5 ppd.     Past Medical History  Diagnosis Date  . Hepatitis C    History reviewed. No pertinent past surgical history. Family History  Problem Relation Age of Onset  . Hypertension Mother   . Diabetes Mother   . Heart disease Mother    Social History  Substance Use Topics  . Smoking status: Current Every Day Smoker -- 1.00 packs/day    Types: Cigarettes  . Smokeless tobacco: Never Used  . Alcohol Use: Yes     Comment: "a pint a day"    Review of Systems All other systems negative unless otherwise stated in HPI    Allergies  Aspirin  Home Medications   Prior to Admission medications   Medication Sig Start Date End Date Taking? Authorizing Provider  hydrocortisone 1 % lotion Apply 1 application topically 2 (two) times daily. Patient not taking: Reported on 08/21/2015 07/28/15   Micheline Chapman, NP  lactulose (CEPHULAC) 10 G packet Take 1 packet (10 g total) by mouth 3 (three) times daily. Patient not taking: Reported on 07/28/2015 06/11/15   Jarrett Soho Muthersbaugh, PA-C   BP 175/90 mmHg  Pulse 77  Temp(Src) 98.3 F (36.8 C) (Oral)  Resp 18  SpO2 100% Physical Exam  Constitutional: He is oriented to person, place, and  time. He appears well-developed and well-nourished.  Patient lying in bed in NAD with lower extremities shaking.  HENT:  Head: Normocephalic and atraumatic.  Mouth/Throat: Oropharynx is clear and moist.  Eyes: Conjunctivae are normal. Pupils are equal, round, and reactive to light. Scleral icterus is present.  Neck: Normal range of motion. Neck supple.  Cardiovascular: Normal rate, regular rhythm and normal heart sounds.   No murmur heard. Pulses:      Radial pulses are 2+ on the right side, and 2+ on the left side.       Posterior tibial pulses are 2+ on the right side, and 2+ on the left side.  Pulmonary/Chest: Effort normal and breath sounds normal. No accessory muscle usage or stridor. No respiratory distress. He has no wheezes. He has no rhonchi. He has no rales.  Abdominal: Soft. Bowel sounds are normal. He exhibits no distension. There is no tenderness.  Musculoskeletal: Normal range of motion.  Lymphadenopathy:    He has no cervical adenopathy.  Neurological: He is alert and oriented to person, place, and time.  Speech clear without dysarthria. Sensation intact bilaterally in upper and lower extremities. 5/5 strength in ankle dorsiflexion and plantarflexion bilaterally 5/5 strength in hips bilaterally  Skin: Skin is warm and dry.  Psychiatric: He has a normal mood and affect. His behavior is normal.    ED Course  Procedures (including critical care time) Labs Review Labs Reviewed  CBC - Abnormal; Notable  for the following:    WBC 3.9 (*)    RBC 3.85 (*)    HCT 38.2 (*)    MCH 35.3 (*)    Platelets 97 (*)    All other components within normal limits  COMPREHENSIVE METABOLIC PANEL - Abnormal; Notable for the following:    Glucose, Bld 113 (*)    BUN <5 (*)    Calcium 8.6 (*)    Albumin 2.6 (*)    AST 197 (*)    ALT 91 (*)    Alkaline Phosphatase 153 (*)    Total Bilirubin 4.0 (*)    All other components within normal limits  URINALYSIS, ROUTINE W REFLEX MICROSCOPIC  (NOT AT [facility]) - Abnormal; Notable for the following:    Color, Urine AMBER (*)    APPearance CLOUDY (*)    Bilirubin Urine SMALL (*)    Urobilinogen, UA 2.0 (*)    All other components within normal limits  APTT  MAGNESIUM  PROTIME-INR  I-STAT TROPOININ, ED  I-STAT TROPOININ, ED    Imaging Review Dg Chest 2 View  08/21/2015  CLINICAL DATA:  Chest pain, shortness of Breath EXAM: CHEST  2 VIEW COMPARISON:  05/08/2015 FINDINGS: Cardiomediastinal silhouette is stable. No acute infiltrate or pleural effusion. No pulmonary edema. Bony thorax is unremarkable. IMPRESSION: No active cardiopulmonary disease. Electronically Signed   By: Lahoma Crocker M.D.   On: 08/21/2015 09:38   I have personally reviewed and evaluated these images and lab results as part of my medical decision-making.   EKG Interpretation   Date/Time:  Thursday August 21 2015 08:58:36 EDT Ventricular Rate:  79 PR Interval:  164 QRS Duration: 77 QT Interval:  410 QTC Calculation: 470 R Axis:   70 Text Interpretation:  Sinus arrhythmia Baseline wander in lead(s) II III  aVF Nonspecific TW changes, similar to prior EKG Confirmed by [facility]  MD, [place] ([id number]) on 08/21/2015 3:06:46 PM      MDM   Final diagnoses:  Atypical chest pain    Patient presents with chest pain and dizziness since 1 hour ago.  VSS, NAD.  On exam, heart RRR, lungs CTAB, abdomen soft and non-tender.  Lower extremities are shaking.  EKG shows nonspecific changes, no acute changes.  Will obtain CXR, Mg, PT, PTT, troponin, UA, CBC, and CMP.  Will give NTG SL.  HEART score- 3.  Will check troponin at 0 and 3 hours.   EKG shows sinus arrhythmia, borderline wander in leads II, III, aVF, no acute changes.  CXR shows no active cardiopulmonary disease. Troponin x 1 0.01.  Will obtain delta troponin at 1:10. Troponin x 2 is 0.01.  Stable. CBC, PT, PTT, Mg unremarkable. CMP shows improvement in AST 197, ALT 91, and total bilirubin 4.0.  Evaluation does  not show pathology requring ongoing emergent intervention or admission. Pt is hemodynamically stable and mentating appropriately. Discussed findings/results and plan with patient/guardian, who agrees with plan. All questions answered. Return precautions discussed and outpatient follow up given.   Case has been discussed with and seen by Dr. Billy Fischer who agrees with the above plan for discharge.    Gloriann Loan, PA-C 08/21/15 1516  Gareth Morgan, MD 08/24/15 1354

## 2015-08-21 NOTE — ED Notes (Signed)
Returned from xray

## 2015-08-26 ENCOUNTER — Ambulatory Visit (INDEPENDENT_AMBULATORY_CARE_PROVIDER_SITE_OTHER): Payer: Self-pay | Admitting: Family Medicine

## 2015-08-26 VITALS — BP 146/83 | HR 80 | Temp 98.3°F | Resp 16 | Ht 66.5 in | Wt 140.0 lb

## 2015-08-26 DIAGNOSIS — Z638 Other specified problems related to primary support group: Secondary | ICD-10-CM

## 2015-08-26 DIAGNOSIS — F17200 Nicotine dependence, unspecified, uncomplicated: Secondary | ICD-10-CM

## 2015-08-26 DIAGNOSIS — F1010 Alcohol abuse, uncomplicated: Secondary | ICD-10-CM

## 2015-08-26 DIAGNOSIS — R0789 Other chest pain: Secondary | ICD-10-CM

## 2015-08-26 DIAGNOSIS — F439 Reaction to severe stress, unspecified: Secondary | ICD-10-CM

## 2015-08-26 DIAGNOSIS — B1920 Unspecified viral hepatitis C without hepatic coma: Secondary | ICD-10-CM | POA: Insufficient documentation

## 2015-08-26 DIAGNOSIS — Z0000 Encounter for general adult medical examination without abnormal findings: Secondary | ICD-10-CM
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
## 2015-08-27 ENCOUNTER — Encounter: Payer: Self-pay | Admitting: Family Medicine

## 2015-09-02 ENCOUNTER — Telehealth (HOSPITAL_COMMUNITY): Payer: Self-pay | Admitting: *Deleted

## 2015-09-17 ENCOUNTER — Ambulatory Visit (INDEPENDENT_AMBULATORY_CARE_PROVIDER_SITE_OTHER): Payer: Self-pay | Admitting: Internal Medicine

## 2015-09-17 ENCOUNTER — Encounter: Payer: Self-pay | Admitting: Internal Medicine

## 2015-09-17 VITALS — BP 137/84 | HR 90 | Temp 98.5°F | Ht 66.5 in | Wt 138.0 lb

## 2015-09-17 DIAGNOSIS — Z23 Encounter for immunization: Secondary | ICD-10-CM

## 2015-09-17 DIAGNOSIS — B182 Chronic viral hepatitis C: Secondary | ICD-10-CM | POA: Insufficient documentation

## 2015-09-17 NOTE — Progress Notes (Signed)
[facility]   CC: consideration for treatment for chronic hepatitis C  HPI:  +Peter Nelson is a 64 y.o. male who presents for initial evaluation and management of chronic hepatitis C.  Patient tested positive while in jail several years ago. Hepatitis C-associated risk factors present are: IV drug abuse (details: over 30 years ago), also tattos. Patient denies history of blood transfusion, intranasal drug use. Patient has had other studies performed. Results: hepatitis C RNA by PCR, result: positive. Patient has not had prior treatment for Hepatitis C. Patient does not have a past history of liver disease. Patient does not have a family history of liver disease. Patient does  have associated signs or symptoms related to liver disease.  Labs reviewed and confirm chronic hepatitis C with a positive viral load.   Records reviewed from recent PCP visit and does have a history of significant alcohol use, though does not drink daily and no history of shaking or DTs.  Feels he can quit easily.   Platelets also noted to be low and I suspect cirrhosis.      Patient does have documented immunity to Hepatitis A. Patient does not have documented immunity to Hepatitis B.    Review of Systems:   Constitutional: negative for fatigue and malaise Gastrointestinal: negative for diarrhea and constipation Musculoskeletal: negative for myalgias and arthralgias All other systems reviewed and are negative      Past Medical History  Diagnosis Date  . Hepatitis C     Prior to Admission medications   Medication Sig Start Date End Date Taking? Authorizing Provider  lactulose (CEPHULAC) 10 G packet Take 1 packet (10 g total) by mouth 3 (three) times daily. Patient not taking: Reported on 09/17/2015 06/11/15   Jarrett Soho Muthersbaugh, PA-C    Allergies  Allergen Reactions  . Aspirin Other (See Comments)    Does not take because of hep c    Social History  Substance Use Topics    . Smoking status: Current Every Day Smoker -- 1.00 packs/day    Types: Cigarettes  . Smokeless tobacco: Never Used     Comment: cutting back  . Alcohol Use: 0.0 oz/week    0 Standard drinks or equivalent per week     Comment: "a pint a day"    Family History  Problem Relation Age of Onset  . Hypertension Mother   . Diabetes Mother   . Heart disease Mother   no liver cirrhosis or liver cancer   Objective:  Constitutional: in no apparent distress and alert,  Filed Vitals:   09/17/15 1426  BP: 137/84  Pulse: 90  Temp: 98.5 F (36.9 C)   Eyes: anicteric Cardiovascular: Cor RRR and No murmurs Respiratory: CTA B; normal respiratory effort Gastrointestinal: Bowel sounds are normal, liver is not enlarged, spleen is not enlarged Musculoskeletal: peripheral pulses normal, no pedal edema, no clubbing or cyanosis Skin: negative for - jaundice, spider hemangioma, telangiectasia, palmar erythema, ecchymosis and atrophy, --- POSITIVES: eczema on back; no porphyria cutanea tarda Lymphatic: no cervical lymphadenopathy   Laboratory Genotype:  Lab Results  Component Value Date   HCVGENOTYPE 1a 08/18/2015   HCV viral load:  Lab Results  Component Value Date   HCVQUANT [id number]* 08/18/2015   Lab Results  Component Value Date   WBC 3.9* 08/21/2015   HGB 13.6 08/21/2015   HCT 38.2* 08/21/2015   MCV 99.2 08/21/2015   PLT 97* 08/21/2015    Lab Results  Component Value  Date   CREATININE 0.94 08/21/2015   BUN <5* 08/21/2015   NA 139 08/21/2015   K 3.9 08/21/2015   CL 105 08/21/2015   CO2 24 08/21/2015    Lab Results  Component Value Date   ALT 91* 08/21/2015   AST 197* 08/21/2015   ALKPHOS 153* 08/21/2015     Labs and history reviewed and show CHILD-PUGH B  5-6 points: Child class A 7-9 points: Child class B 10-15 points: Child class C  Lab Results  Component Value Date   INR 1.18 08/21/2015   BILITOT 4.0* 08/21/2015   ALBUMIN 2.6* 08/21/2015     Assessment:  New Patient with Chronic Hepatitis C genotype 1a, untreated.  I discussed with the patient the lab findings that confirm chronic hepatitis C as well as the natural history and progression of disease including about 30% of people who develop cirrhosis of the liver if left untreated and once cirrhosis is established there is a 2-7% risk per year of liver cancer and liver failure.  I discussed the importance of treatment and benefits in reducing the risk, even if significant liver fibrosis exists.   Plan: 1) Patient counseled extensively on limiting acetaminophen to no more than 2 grams daily, avoidance of alcohol. 2) Transmission discussed with patient including sexual transmission, sharing razors and toothbrush.   3) Will need referral to gastroenterology if concern for cirrhosis 4) Will need referral for substance abuse counseling: Yes.  ; He will see our SA counselor to emphasize complete abstinence of alcohol.  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  Further work up to include urine drug screen  No. 5) Will prescribe Harvoni for 12 weeks 6) Hepatitis A vaccine No. 7) Hepatitis B vaccine Yes.   8) Pneumovax vaccine  9) Further work up to include liver staging with elastography 10) will follow up after elastography

## 2015-09-17 NOTE — Patient Instructions (Signed)
Date 09/17/2015  Dear Mr. Corp, As discussed in the [facility], your hepatitis C therapy will include the following medications:          Harvoni 63m/400mg tablet:           Take 1 tablet by mouth once daily   Please note that ALL MEDICATIONS WILL START ON THE SAME DATE for a total of 12 weeks. ---------------------------------------------------------------- Your HCV Treatment Start Date: TBA   Your HCV genotype:  1a    Liver Fibrosis: TBD    ---------------------------------------------------------------- YOUR PHARMAC[facility] CONTACT:   [facility] R[facility]Phone: [phone]: Monday to Friday 7:30 am to 6:00 pm   Please always contact your pharmacy at least 3-4 business days before you run out of medications to ensure your next month's medication is ready or 1 week prior to running out if you receive it by mail.  Remember, each prescription is for 28 days. ---------------------------------------------------------------- GENERAL NOTES REGARDING YOUR HEPATITIS C MEDICATION:  SOFOSBUVIR/LEDIPASVIR (HARVONI): - Harvoni tablet is taken daily with OR without food. - The tablets are orange. - The tablets should be stored at room temperature.  - Acid reducing agents such as H2 blockers (ie. Pepcid (famotidine), Zantac (ranitidine), Tagamet (cimetidine), Axid (nizatidine) and proton pump inhibitors (ie. Prilosec (omeprazole), Protonix (pantoprazole), Nexium (esomeprazole), or Aciphex (rabeprazole)) can decrease effectiveness of Harvoni. Do not take until you have discussed with a health care provider.    -Antacids that contain magnesium and/or aluminum hydroxide (ie. Milk of Magensia, Rolaids, Gaviscon, Maalox, Mylanta, an dArthritis Pain Formula)can reduce absorption of Harvoni, so take them at least 4 hours before or after Harvoni.  -Calcium carbonate (calcium supplements or antacids such as Tums, Caltrate,  Os-Cal)needs to be taken at least 4 hours hours before or after Harvoni.  -St. John's wort or any products that contain St. John's wort like some herbal supplements  Please inform the office prior to starting any of these medications.  - The common side effects associated with Harvoni include:      1. Fatigue      2. Headache      3. Nausea      4. Diarrhea      5. Insomnia  Please note that this only lists the most common side effects and is NOT a comprehensive list of the potential side effects of these medications. For more information, please review the drug information sheets that come with your medication package from the pharmacy.  ---------------------------------------------------------------- GENERAL HELPFUL HINTS ON HCV THERAPY: 1. Stay well-hydrated. 2. Notify the [facility] of any changes in your other over-the-counter/herbal or prescription medications. 3. If you miss a dose of your medication, take the missed dose as soon as you remember. Return to your regular time/dose schedule the next day.  4.  Do not stop taking your medications without first talking with your healthcare provider. 5.  You may take Tylenol (acetaminophen), as long as the dose is less than 2000 mg (OR no more than 4 tablets of the Tylenol Extra Strengths 5098mtablet) in 24 hours. 6.  You will see our pharmacist-specialist within the first 2 weeks of starting your medication. 7.  You will need to obtain routine labs around week 4 and12 weeks after starting and then 3 to 6 months after finishing Harvoni.    COScharlene GlossMDVenetieor InBonner  [place]  [id number] [phone]

## 2015-09-18 ENCOUNTER — Ambulatory Visit: Payer: Medicaid Other | Attending: Family Medicine

## 2015-09-19 ENCOUNTER — Ambulatory Visit: Payer: Medicaid Other | Attending: Internal Medicine

## 2015-09-21 LAB — HCV RNA NS5A DRUG RESISTANCE

## 2015-09-23 ENCOUNTER — Telehealth: Payer: Self-pay | Admitting: *Deleted

## 2015-09-23 NOTE — Telephone Encounter (Signed)
Patient notified of appt for ultrasound at [facility] on 10/23/15 at 8:45 AM. Nothing to eat or drink after midnight. Myrtis Hopping

## 2015-09-25 ENCOUNTER — Ambulatory Visit: Payer: Medicaid Other | Admitting: Neurology

## 2015-09-25 ENCOUNTER — Ambulatory Visit: Payer: Self-pay | Admitting: *Deleted

## 2015-09-25 DIAGNOSIS — F1010 Alcohol abuse, uncomplicated: Secondary | ICD-10-CM

## 2015-09-25 NOTE — BH Specialist Note (Signed)
Kate was present for his scheduled appointment today.  Client was oriented times four with good affect and dress.  Client was alert and talkative.  Client shared openly and honestly communicating what he agreed to and didn't agree too.  Client admitted that he drinks heavenly and knows that it is damaging his liver.  Client stated that he knows the drinking caused his Hep C.  Counselor encouraged client to pursue DETOX and or outpatient substance abuse treatment.  Client communicated that he would consider not drinking but was not willing to start until after the holidays.  Client shared that his older brother died as a result of Hep C and understood the seriousness of the disease.  Counselor provided client the necessary information and referral to enter into treatment with [facility].  Client was given the appointment for next Tuesday.    Rolena Infante, MA [facility]

## 2015-10-01 ENCOUNTER — Encounter: Payer: Medicaid Other | Admitting: Internal Medicine

## 2015-10-19 HISTORY — PX: LIVER SURGERY: SHX698

## 2015-10-19 HISTORY — PX: LIVER BIOPSY: SHX301

## 2015-10-23 ENCOUNTER — Ambulatory Visit (HOSPITAL_COMMUNITY)
Admission: RE | Admit: 2015-10-23 | Discharge: 2015-10-23 | Disposition: A | Discharge status: Home / Self Care | Payer: Self-pay | Source: Ambulatory Visit | Attending: Internal Medicine | Admitting: Internal Medicine

## 2015-10-23 ENCOUNTER — Other Ambulatory Visit: Payer: Self-pay | Admitting: Internal Medicine

## 2015-10-23 ENCOUNTER — Telehealth: Payer: Self-pay | Admitting: *Deleted

## 2015-10-23 DIAGNOSIS — R160 Hepatomegaly, not elsewhere classified: Secondary | ICD-10-CM

## 2015-10-23 DIAGNOSIS — K769 Liver disease, unspecified: Secondary | ICD-10-CM | POA: Insufficient documentation

## 2015-10-23 DIAGNOSIS — B182 Chronic viral hepatitis C: Secondary | ICD-10-CM | POA: Insufficient documentation

## 2015-10-23 NOTE — Telephone Encounter (Signed)
Spoke to patient and urgent referral made to [facility] for an MRI. If unable to be scheduled I will ask patient to just be billed for the MRI. Myrtis Hopping

## 2015-10-23 NOTE — Telephone Encounter (Signed)
-----   Message from Thayer Headings, MD sent at 10/23/2015 11:40 AM EST ----- He needs MRI of the liver for  Follow up of the ultrasound, concern for cancer. thanks ----- Message -----    From: Rad Results In Interface    Sent: 10/23/2015  10:39 AM      To: Thayer Headings, MD

## 2015-10-27 ENCOUNTER — Ambulatory Visit: Payer: Medicaid Other

## 2015-10-27 ENCOUNTER — Ambulatory Visit: Payer: Medicaid Other | Admitting: *Deleted

## 2015-10-30 ENCOUNTER — Ambulatory Visit (INDEPENDENT_AMBULATORY_CARE_PROVIDER_SITE_OTHER): Payer: Self-pay | Admitting: Internal Medicine

## 2015-10-30 ENCOUNTER — Ambulatory Visit: Payer: Medicaid Other

## 2015-10-30 ENCOUNTER — Encounter: Payer: Self-pay | Admitting: Internal Medicine

## 2015-10-30 VITALS — BP 152/75 | HR 90 | Temp 97.8°F | Wt 140.0 lb

## 2015-10-30 DIAGNOSIS — R160 Hepatomegaly, not elsewhere classified: Secondary | ICD-10-CM | POA: Insufficient documentation

## 2015-10-30 DIAGNOSIS — Z23 Encounter for immunization: Secondary | ICD-10-CM

## 2015-10-30 DIAGNOSIS — F1010 Alcohol abuse, uncomplicated: Secondary | ICD-10-CM

## 2015-10-30 DIAGNOSIS — K625 Hemorrhage of anus and rectum: Secondary | ICD-10-CM

## 2015-10-30 DIAGNOSIS — K7460 Unspecified cirrhosis of liver: Secondary | ICD-10-CM

## 2015-10-30 DIAGNOSIS — B182 Chronic viral hepatitis C: Secondary | ICD-10-CM

## 2015-10-30 LAB — CBC
HCT: 38.3 % — ABNORMAL LOW (ref 39.0–52.0)
HEMOGLOBIN: 13 g/dL (ref 13.0–17.0)
MCH: 34.3 pg — ABNORMAL HIGH (ref 26.0–34.0)
MCHC: 33.9 g/dL (ref 30.0–36.0)
MCV: 101.1 fL — ABNORMAL HIGH (ref 78.0–100.0)
MPV: 10.4 fL (ref 8.6–12.4)
Platelets: 115 10*3/uL — ABNORMAL LOW (ref 150–400)
RBC: 3.79 MIL/uL — AB (ref 4.22–5.81)
RDW: 14.2 % (ref 11.5–15.5)
WBC: 4.1 10*3/uL (ref 4.0–10.5)

## 2015-10-30 NOTE — Progress Notes (Signed)
   Subjective:    Patient ID: Peter Nelson, male    DOB: 10-Sep-1951, 65 y.o.   MRN: [id number]  HPI Here for follow up of HCV.   He is genotype 1a, elastography F3/4, Child Pugh B and ultrasound with concern for liver mass.  He is seeing our counselor for alcohol abuse and still struggling with motivation to quit.  He also has lab abnormalities suggestive of liver disease, as noted with Child Pugh score.  Results discussed with him during this visit.    Review of Systems  Constitutional: Negative for fatigue.  Gastrointestinal: Negative for nausea and diarrhea.  Skin: Negative for rash.  Neurological: Negative for dizziness and light-headedness.       Objective:   Physical Exam  Constitutional: He appears well-developed and well-nourished. No distress.  Eyes: No scleral icterus.  Cardiovascular: Normal rate, regular rhythm and normal heart sounds.   No murmur heard. Pulmonary/Chest: Effort normal and breath sounds normal. No respiratory distress.  Skin: No rash noted.    Social History   Social History  . Marital Status: Widowed    Spouse Name: [name]  . Number of Children: [name]  . Years of Education: [name]   Occupational History  . Not on file.   Social History Main Topics  . Smoking status: Current Every Day Smoker -- 0.50 packs/day    Types: Cigarettes  . Smokeless tobacco: Never Used     Comment: cutting back  . Alcohol Use: 0.0 oz/week    0 Standard drinks or equivalent per week     Comment: "a 1/2 pint a day"  . Drug Use: No  . Sexual Activity: Not on file   Other Topics Concern  . Not on file   Social History Narrative   Continues to drink but less      Assessment & Plan:

## 2015-10-30 NOTE — Assessment & Plan Note (Signed)
I will check his cbc today with some recent blood loss, if ok, I will consider Harvoni with ribavirin for 12 weeks.  If any concerns with Hgb, will try for Harvoni for 24 weeks.

## 2015-10-30 NOTE — Assessment & Plan Note (Signed)
Will check cbc and if significantly down, will send to ED, otherwise will refer to GI.

## 2015-10-30 NOTE — Assessment & Plan Note (Signed)
Trying to get an MRI but needs pt assistance more than the [facility] card.  I will check AFP and if up will send to oncology even without the MRI.

## 2015-10-30 NOTE — Assessment & Plan Note (Signed)
Will refer to GI for EGD

## 2015-10-30 NOTE — Assessment & Plan Note (Signed)
I discussed the results of the mass and cirrhosis.  I emphasized that treatment of hepatitis C and even of cancer, if confirmed, will be little benefit if he continues drinking.  He tells me it is a wake up call for him.

## 2015-10-31 LAB — AFP TUMOR MARKER: AFP TUMOR MARKER: 95.2 ng/mL — AB (ref <6.1)

## 2015-11-03 ENCOUNTER — Other Ambulatory Visit: Payer: Self-pay | Admitting: Internal Medicine

## 2015-11-03 DIAGNOSIS — R160 Hepatomegaly, not elsewhere classified: Secondary | ICD-10-CM

## 2015-11-07 ENCOUNTER — Telehealth: Payer: Self-pay | Admitting: *Deleted

## 2015-11-07 NOTE — Telephone Encounter (Signed)
Oncology Nurse Navigator Documentation  Oncology Nurse Navigator Flowsheets 11/07/2015  Navigator Location [facility]  Navigator Encounter Type Introductory phone call  Abnormal Finding Date 10/23/2015  Spoke with patient and fiance' Peter Nelson to provide new patient appointment for 11/13/15 at 2:15/2:30 to see Dr. Burr Medico. Informed of location of [place], valet service, and registration process. Reminded to bring insurance cards and a current medication list, including supplements. Patient verbalizes understanding. Welcome packet mailed to home.

## 2015-11-10 ENCOUNTER — Encounter: Payer: Self-pay | Admitting: Neurology

## 2015-11-10 ENCOUNTER — Ambulatory Visit (INDEPENDENT_AMBULATORY_CARE_PROVIDER_SITE_OTHER): Payer: Self-pay | Admitting: Neurology

## 2015-11-10 ENCOUNTER — Other Ambulatory Visit (INDEPENDENT_AMBULATORY_CARE_PROVIDER_SITE_OTHER): Payer: Medicaid Other

## 2015-11-10 VITALS — BP 132/76 | HR 15 | Resp 16 | Wt 143.0 lb

## 2015-11-10 DIAGNOSIS — B1920 Unspecified viral hepatitis C without hepatic coma: Secondary | ICD-10-CM

## 2015-11-10 DIAGNOSIS — B199 Unspecified viral hepatitis without hepatic coma: Secondary | ICD-10-CM | POA: Insufficient documentation

## 2015-11-10 DIAGNOSIS — R160 Hepatomegaly, not elsewhere classified: Secondary | ICD-10-CM

## 2015-11-10 DIAGNOSIS — R413 Other amnesia: Secondary | ICD-10-CM

## 2015-11-10 DIAGNOSIS — F1010 Alcohol abuse, uncomplicated: Secondary | ICD-10-CM

## 2015-11-10 DIAGNOSIS — K7030 Alcoholic cirrhosis of liver without ascites: Secondary | ICD-10-CM

## 2015-11-10 LAB — CREATININE, SERUM: Creatinine, Ser: 0.84 mg/dL (ref 0.40–1.50)

## 2015-11-10 LAB — BUN: BUN: 8 mg/dL (ref 6–23)

## 2015-11-10 LAB — TSH: TSH: 2.06 u[IU]/mL (ref 0.35–4.50)

## 2015-11-10 LAB — VITAMIN B12: VITAMIN B 12: 945 pg/mL — AB (ref 211–911)

## 2015-11-10 NOTE — Patient Instructions (Signed)
1. Bloodwork for TSH, B12, creatinine, BUN 2. Schedule head CT with and without contrast 3. Physical exercise and brain stimulation exercises are important for brain health 4. Follow-up in 1 year

## 2015-11-10 NOTE — Progress Notes (Signed)
NEUROLOGY CONSULTATION NOTE  Peter Nelson MRN: [id number] DOB: Oct 26, 1950  Referring provider: Dr. Scharlene Gloss Primary care provider: Dr. Scharlene Gloss  Reason for consult:  Memory loss  Dear Dr Linus Salmons:  Thank you for your kind referral of Peter Nelson for consultation of the above symptoms. Although his history is well known to you, please allow me to reiterate it for the purpose of our medical record. The patient was accompanied to the clinic by his wife and sister who also provide collateral information. Records and images were personally reviewed where available.  HISTORY OF PRESENT ILLNESS: This is a pleasant 65 year old ambidextrous right-hand dominant man with a history of alcohol abuse, hepatitis C, liver cirrhosis, liver mass, presenting for evaluation of worsening memory. He and his wife started noticing changes around 6 months ago, his wife has noticed that he would misplace things more (they still have not found 2 objects he lost). He would go to a different room and forget what he came to get. He has noticed some word-finding difficulties, he want to express himself but they don't come out the way he wants to. His wife of 13 months has noticed he repeats himself. He has had personality changes where he would all of a sudden get very moody, no paranoia. He denies getting lost driving, no missed medications. His wife is in charge of bills. He denies any family history of memory loss, no significant head injuries. He drinks 1/2 pint of alcohol daily, reports he has reduced this from 1 pint daily.  He has had headaches for the past few years occurring around twice a month, lasting a minute or so, with no associated nausea, vomiting, photo/phonophobia. He has blurred vision. He reports occasional blood in his stool with diarrhea. He denies any dizziness, diplopia, dysarthria, dysphagia, back pain, focal numbness/tingling/weakness, anosmia. He has arthritis. He has noticed tremors in  both hands ("I think they come from alcohol").   Diagnostic Data: Lab Results  Component Value Date   WBC 4.1 10/30/2015   HGB 13.0 10/30/2015   HCT 38.3* 10/30/2015   MCV 101.1* 10/30/2015   PLT 115* 10/30/2015     Chemistry      Component Value Date/Time   NA 139 08/21/2015 1004   K 3.9 08/21/2015 1004   CL 105 08/21/2015 1004   CO2 24 08/21/2015 1004   BUN <5* 08/21/2015 1004   CREATININE 0.94 08/21/2015 1004   CREATININE 0.79 07/28/2015 1012      Component Value Date/Time   CALCIUM 8.6* 08/21/2015 1004   ALKPHOS 153* 08/21/2015 1004   AST 197* 08/21/2015 1004   ALT 91* 08/21/2015 1004   BILITOT 4.0* 08/21/2015 1004     Recent ultrasound 10/23/15 showed right hepatic lobe lesion suspicious for hepatocellular carcinoma. He has an appointment with Oncology in a few days.  PAST MEDICAL HISTORY: Past Medical History  Diagnosis Date  . Hepatitis C     PAST SURGICAL HISTORY: Past Surgical History  Procedure Laterality Date  . Skin graft  1991    Left Hand    MEDICATIONS: No current outpatient prescriptions on file prior to visit.   No current facility-administered medications on file prior to visit.    ALLERGIES: Allergies  Allergen Reactions  . Aspirin Other (See Comments)    Does not take because of hep c    FAMILY HISTORY: Family History  Problem Relation Age of Onset  . Hypertension Mother   . Diabetes Mother   .  Heart disease Mother   . Diabetes Sister   . Diabetes Father   . Cancer Brother     Liver  . Alzheimer's disease Mother     SOCIAL HISTORY: Social History   Social History  . Marital Status: Married    Spouse Name: [name]  . Number of Children: 3  . Years of Education: [name]   Occupational History  . Not on file.   Social History Main Topics  . Smoking status: Current Every Day Smoker -- 0.50 packs/day for 45 years    Types: Cigarettes  . Smokeless tobacco: Never Used     Comment: cutting back  . Alcohol Use: 0.0 oz/week    0  Standard drinks or equivalent per week     Comment: "a 1/2 pint a day"  . Drug Use: No  . Sexual Activity: Not on file   Other Topics Concern  . Not on file   Social History Narrative    REVIEW OF SYSTEMS: Constitutional: No fevers, chills, or sweats, no generalized fatigue, change in appetite Eyes: No visual changes, double vision, eye pain Ear, nose and throat: No hearing loss, ear pain, nasal congestion, sore throat Cardiovascular: No chest pain, palpitations Respiratory:  No shortness of breath at rest or with exertion, wheezes GastrointestinaI: No nausea, vomiting, diarrhea, abdominal pain, fecal incontinence Genitourinary:  No dysuria, urinary retention or frequency Musculoskeletal:  + neck pain,no back pain Integumentary: No rash, pruritus, skin lesions Neurological: as above Psychiatric: No depression, insomnia, anxiety Endocrine: No palpitations, fatigue, diaphoresis, mood swings, change in appetite, change in weight, increased thirst Hematologic/Lymphatic:  No anemia, purpura, petechiae. Allergic/Immunologic: no itchy/runny eyes, nasal congestion, recent allergic reactions, rashes  PHYSICAL EXAM: Filed Vitals:   11/10/15 1009  BP: 132/76  Pulse: 15  Resp: 16   General: No acute distress Head:  Normocephalic/atraumatic Eyes: Fundoscopic exam shows bilateral sharp discs, no vessel changes, exudates, or hemorrhages Neck: supple, no paraspinal tenderness, full range of motion Back: No paraspinal tenderness Heart: regular rate and rhythm Lungs: Clear to auscultation bilaterally. Vascular: No carotid bruits. Skin/Extremities: No rash, no edema Neurological Exam: Mental status: alert and oriented to person, place, month/year, no dysarthria or aphasia, Fund of knowledge is appropriate.  Recent and remote memory are intact.  Attention and concentration are normal.    Able to name objects and repeat phrases.  MMSE - Mini Mental State Exam 11/10/2015  Orientation to time 3   Orientation to Place 5  Registration 3  Attention/ Calculation 5  Recall 2  Language- name 2 objects 2  Language- repeat 1  Language- follow 3 step command 3  Language- read & follow direction 1  Write a sentence 1  Copy design 1  Total score 27   Cranial nerves: CN I: not tested CN II: pupils equal, round and reactive to light, visual fields intact, fundi unremarkable. CN III, IV, VI:  full range of motion, no nystagmus, no ptosis CN V: facial sensation intact CN VII: upper and lower face symmetric CN VIII: hearing intact to finger rub CN IX, X: gag intact, uvula midline CN XI: sternocleidomastoid and trapezius muscles intact CN XII: tongue midline Bulk & Tone: normal, no fasciculations. Motor: 5/5 throughout with no pronator drift. Sensation: decreased vibration to ankles bilaterally, otherwise intact to light touch, cold, pin,and joint position sense.  No extinction to double simultaneous stimulation.  Romberg test negative Deep Tendon Reflexes: brisk +2 throughout, no ankle clonus, negative Hoffman sign Plantar responses: downgoing bilaterally Cerebellar: no  incoordination on finger to nose, heel to shin. No dysdiadochokinesia Gait: narrow-based and steady, able to tandem walk adequately. Tremor: none today  IMPRESSION: This is a pleasant 65 year old right-handed man with a history of alcohol abuse, hepatitis C, liver cirrhosis, recently found to have a liver mass concerning for hepatocellular carcinoma, presenting with a 24-monthhistory of worsening memory. MMSE today normal 27/30. Neurological exam note of brisk reflexes, otherwise non-focal. We discussed different causes of memory loss. Check TSH and B12. CT head with and without contrast will be ordered to assess for underlying structural abnormality, particularly with concern for liver cancer. We discussed effects of alcohol on memory, and again discussed continued plan for tapering off alcohol use. We discussed the  importance of control of vascular risk factors, physical exercise, and brain stimulation exercises for brain health. He will follow-up in 1 year or earlier if needed.   Thank you for allowing me to participate in the care of this patient. Please do not hesitate to call for any questions or concerns.   KEllouise Newer M.D.  CC: Dr. CLinus Salmons

## 2015-11-11 ENCOUNTER — Ambulatory Visit (INDEPENDENT_AMBULATORY_CARE_PROVIDER_SITE_OTHER): Payer: Self-pay | Admitting: Physician Assistant

## 2015-11-11 ENCOUNTER — Encounter: Payer: Self-pay | Admitting: Physician Assistant

## 2015-11-11 VITALS — BP 120/68 | HR 94 | Ht 66.5 in | Wt 143.2 lb

## 2015-11-11 DIAGNOSIS — R160 Hepatomegaly, not elsewhere classified: Secondary | ICD-10-CM

## 2015-11-11 DIAGNOSIS — K625 Hemorrhage of anus and rectum: Secondary | ICD-10-CM

## 2015-11-11 DIAGNOSIS — B1710 Acute hepatitis C without hepatic coma: Secondary | ICD-10-CM

## 2015-11-11 DIAGNOSIS — K7030 Alcoholic cirrhosis of liver without ascites: Secondary | ICD-10-CM

## 2015-11-11 MED ORDER — NA SULFATE-K SULFATE-MG SULF 17.5-3.13-1.6 GM/177ML PO SOLN: 1.0000 | Freq: Once | ORAL | Status: DC

## 2015-11-11 NOTE — Patient Instructions (Signed)
Stop the iron supplement.   You have been scheduled for an endoscopy and colonoscopy. Please follow the written instructions given to you at your visit today. Please pick up your prep supplies at the pharmacy within the next 1-3 days. [name]  If you use inhalers (even only as needed), please bring them with you on the day of your procedure. Your physician has requested that you go to www.startemmi.com and enter the access code given to you at your visit today. This web site gives a general overview about your procedure. However, you should still follow specific instructions given to you by our office regarding your preparation for the procedure.

## 2015-11-11 NOTE — Progress Notes (Addendum)
Patient ID: Peter Nelson, male   DOB: 1951/06/07, 65 y.o.   MRN: [id number]   Subjective:    Patient ID: Peter Nelson, male    DOB: Nov 11, 1950, 65 y.o.   MRN: [id number]  HPI  Peter Nelson   Is a pleasant 65 year old African-American male new to GI, referred by Dr. Luciana Axe for new diagnosis of cirrhosis and recent complaint of rectal bleeding. Patient has not had any prior GI evaluation. He was recently diagnosed with hepatitis C infection, genotype 1A and on ultrasound with  elastography  He has an echogenic liver and a right hepatic lobe lesion measuring 2.7 cm suspicious for hepatocellular carcinoma , Metavir fibrosis score is F3 / 4.   10/30/2015 WBC of 4.1 hemoglobin 13 hematocrit of 38 platelets 1:15 ProTime 15.1 INR of 1.18 HIV negative. AFP elevated at 95.2.  MRI was done 11/07/2015 showed the liver and spleen normal limits and a 24 x 23 mm central liver mass containing a large amount of fat no significant arterial enhancement.   patient has been a heavy drinker for many years and is still actively drinking on a daily basis though says he is trying to cut down. He apparently had a very dark stool yesterday and about a week and a half ago had 2 episodes of bright red blood per rectum with bowel movement. He has no complaints of abdominal pain or rectal pain. He had been taking an over-the-counter iron supplement that he started on his own.  Review of Systems Pertinent positive and negative review of systems were noted in the above HPI section.  All other review of systems was otherwise negative.  Outpatient Encounter Prescriptions as of 11/11/2015  Medication Sig  . IRON, FERROUS GLUCONATE, PO Take by mouth daily.  . vitamin B-12 (CYANOCOBALAMIN) 1000 MCG tablet Take 1,000 mcg by mouth daily.   No facility-administered encounter medications on file as of 11/11/2015.   Allergies  Allergen Reactions  . Aspirin Other (See Comments)    Does not take because of hep c   Patient Active Problem List     Diagnosis Date Noted  . Memory loss 11/10/2015  . Alcoholic cirrhosis of liver without ascites (HCC) 11/10/2015  . Hepatitis, viral 11/10/2015  . Liver mass 10/30/2015  . BRBPR (bright red blood per rectum) 10/30/2015  . Hepatic cirrhosis (HCC) 10/30/2015  . Chronic hepatitis C without hepatic coma (HCC) 09/17/2015  . Stress at home 08/26/2015  . Alcohol abuse 07/28/2015  . Tobacco use disorder 07/28/2015  . Syncope 05/08/2015   Social History   Social History  . Marital Status: Married    Spouse Name: [name]  . Number of Children: 9  . Years of Education: [name]   Occupational History  . Not on file.   Social History Main Topics  . Smoking status: Current Every Day Smoker -- 0.50 packs/day for 45 years    Types: Cigarettes  . Smokeless tobacco: Never Used     Comment: cutting back  . Alcohol Use: 0.0 oz/week    0 Standard drinks or equivalent per week     Comment: "a 1/2 pint a day"  . Drug Use: No  . Sexual Activity: Not on file   Other Topics Concern  . Not on file   Social History Narrative    Peter Nelson's family history includes Alzheimer's disease in his mother; Cancer in his brother; Diabetes in his father, mother, and sister; Heart disease in his mother; Hypertension in his mother.  Objective:    Filed Vitals:   11/11/15 1516  BP: 120/68  Pulse: 94    Physical Exam   Well-developed older African-American male in no acute distress, accompanied by his wife , pleasant blood pressure 120/68 pulse 94 height 5 foot 6 weight 143. HEENT; nontraumatic normocephalic EOMI PERRLA sclera anicteric odor of EtOH on breath , Cardiovascular ;regular rate and rhythm with S1-S2 no murmur or gallop, Pulmonary; clear bilaterally, Abdomen ;soft, nontender, nondistended bowel sounds are active there is no palpable mass or hepatosplenomegaly , Rectal; exam not done, Extremities; no clubbing cyanosis or edema skin warm and dry, Neuropsych; mood and affect appropriate , no tremor  and no asterixis     Assessment & Plan:   #1 65 yo AA male with new dx of Hep C /Genotype 1a #2 right liver mass - indeterminate for malignancy by MRI, AFP 95 #3 Fibrotic liver by elastography/early cirrhosis #4 Active alcoholism/ETOH abuse #5 rectal bleeding and dark stool by pt report  Plan;  Patient has pending appointment with oncology later this week  Advised patient to stop oral iron , no evidence of iron deficiency  Importance of EtOH abstinence  Discussed again in strongly advised  Will schedule for EGD and colonoscopy with Dr.Pyrtle. Procedures discussed in detail with patient and he is agreeable to proceed.  Bowie Delia Oswald Hillock PA-C 11/11/2015   Cc: Gardiner Barefoot, MD   Addendum: Reviewed and agree with initial management. Beverley Fiedler, MD

## 2015-11-12 ENCOUNTER — Encounter (HOSPITAL_COMMUNITY): Payer: Self-pay

## 2015-11-12 ENCOUNTER — Ambulatory Visit (HOSPITAL_COMMUNITY)
Admission: RE | Admit: 2015-11-12 | Discharge: 2015-11-12 | Disposition: A | Discharge status: Home / Self Care | Payer: Self-pay | Source: Ambulatory Visit | Attending: Neurology | Admitting: Neurology

## 2015-11-12 DIAGNOSIS — R413 Other amnesia: Secondary | ICD-10-CM | POA: Insufficient documentation

## 2015-11-12 DIAGNOSIS — G319 Degenerative disease of nervous system, unspecified: Secondary | ICD-10-CM | POA: Insufficient documentation

## 2015-11-12 HISTORY — DX: Malignant (primary) neoplasm, unspecified: C80.1

## 2015-11-12 MED ORDER — IOHEXOL 300 MG/ML  SOLN
75.0000 mL | Freq: Once | INTRAMUSCULAR | Status: AC | PRN
  Administered 2015-11-12: 75 mL via INTRAVENOUS

## 2015-11-12 MED ORDER — SOFOSBUVIR-VELPATASVIR 400-100 MG PO TABS: 1.0000 | ORAL_TABLET | Freq: Every day | ORAL | Status: DC

## 2015-11-12 MED ORDER — RIBAVIRIN 200 MG PO CAPS
ORAL_CAPSULE | ORAL | Status: DC
Start: 2015-11-12 — End: 2016-02-19

## 2015-11-12 NOTE — Addendum Note (Signed)
Addended by: Dinah Beers on: 11/12/2015 08:52 AM   Modules accepted: Orders

## 2015-11-13 ENCOUNTER — Encounter: Payer: Self-pay | Admitting: Internal Medicine

## 2015-11-13 ENCOUNTER — Ambulatory Visit (HOSPITAL_BASED_OUTPATIENT_CLINIC_OR_DEPARTMENT_OTHER): Payer: Self-pay

## 2015-11-13 ENCOUNTER — Encounter: Payer: Self-pay | Admitting: Hematology

## 2015-11-13 ENCOUNTER — Ambulatory Visit (HOSPITAL_BASED_OUTPATIENT_CLINIC_OR_DEPARTMENT_OTHER): Payer: Self-pay | Admitting: Hematology

## 2015-11-13 VITALS — BP 154/83 | HR 74 | Temp 98.5°F | Resp 18 | Ht 66.5 in | Wt 142.1 lb

## 2015-11-13 DIAGNOSIS — K7460 Unspecified cirrhosis of liver: Secondary | ICD-10-CM

## 2015-11-13 DIAGNOSIS — R160 Hepatomegaly, not elsewhere classified: Secondary | ICD-10-CM

## 2015-11-13 DIAGNOSIS — Z720 Tobacco use: Secondary | ICD-10-CM

## 2015-11-13 DIAGNOSIS — C220 Liver cell carcinoma: Secondary | ICD-10-CM | POA: Insufficient documentation

## 2015-11-13 DIAGNOSIS — F1010 Alcohol abuse, uncomplicated: Secondary | ICD-10-CM

## 2015-11-13 DIAGNOSIS — Z808 Family history of malignant neoplasm of other organs or systems: Secondary | ICD-10-CM

## 2015-11-13 DIAGNOSIS — B1920 Unspecified viral hepatitis C without hepatic coma: Secondary | ICD-10-CM

## 2015-11-13 LAB — CBC WITH DIFFERENTIAL/PLATELET
BASO%: 0.8 % (ref 0.0–2.0)
BASOS ABS: 0 10*3/uL (ref 0.0–0.1)
EOS ABS: 0.2 10*3/uL (ref 0.0–0.5)
EOS%: 4.9 % (ref 0.0–7.0)
HCT: 38.1 % — ABNORMAL LOW (ref 38.4–49.9)
HEMOGLOBIN: 13.4 g/dL (ref 13.0–17.1)
LYMPH%: 46.5 % (ref 14.0–49.0)
MCH: 35.1 pg — AB (ref 27.2–33.4)
MCHC: 35.2 g/dL (ref 32.0–36.0)
MCV: 99.7 fL — ABNORMAL HIGH (ref 79.3–98.0)
MONO#: 0.5 10*3/uL (ref 0.1–0.9)
MONO%: 10.6 % (ref 0.0–14.0)
NEUT#: 1.8 10*3/uL (ref 1.5–6.5)
NEUT%: 37.2 % — AB (ref 39.0–75.0)
Platelets: 92 10*3/uL — ABNORMAL LOW (ref 140–400)
RBC: 3.82 10*6/uL — ABNORMAL LOW (ref 4.20–5.82)
RDW: 14.5 % (ref 11.0–14.6)
WBC: 4.9 10*3/uL (ref 4.0–10.3)
lymph#: 2.3 10*3/uL (ref 0.9–3.3)

## 2015-11-13 LAB — COMPREHENSIVE METABOLIC PANEL
ALBUMIN: 2.7 g/dL — AB (ref 3.5–5.0)
ALK PHOS: 180 U/L — AB (ref 40–150)
ALT: 87 U/L — ABNORMAL HIGH (ref 0–55)
AST: 192 U/L (ref 5–34)
Anion Gap: 9 mEq/L (ref 3–11)
BUN: 7.6 mg/dL (ref 7.0–26.0)
CALCIUM: 8.4 mg/dL (ref 8.4–10.4)
CHLORIDE: 106 meq/L (ref 98–109)
CO2: 25 mEq/L (ref 22–29)
Creatinine: 0.9 mg/dL (ref 0.7–1.3)
GLUCOSE: 79 mg/dL (ref 70–140)
POTASSIUM: 3.7 meq/L (ref 3.5–5.1)
SODIUM: 140 meq/L (ref 136–145)
Total Bilirubin: 4.41 mg/dL (ref 0.20–1.20)
Total Protein: 8 g/dL (ref 6.4–8.3)

## 2015-11-13 LAB — PROTIME-INR
INR: 1.1 — ABNORMAL LOW (ref 2.00–3.50)
PROTIME: 13.2 s (ref 10.6–13.4)

## 2015-11-13 MED FILL — RIBAVIRIN 200 MG TABLET: 200 | 28 days supply | Qty: 140 | Fill #0

## 2015-11-13 NOTE — Progress Notes (Addendum)
[place]  Telephone:([phone] Fax:([phone]) [phone]  Clinic New Consult Note   Patient Care Team: Thayer Headings, MD as PCP - General (Infectious Diseases) 11/13/2015  REFERRAL PHYSICIAN: Dr. Linus Salmons   CHIEF COMPLAINTS/PURPOSE OF CONSULTATION:  Liver mass   HISTORY OF PRESENTING ILLNESS:  Peter Nelson 65 y.o. male with past medical history of appetite C, liver cirrhosis, is being referred by Dr.Comer for a newly discovered liver mass.    He was found to have Hep C about 3-4 years ago when he was in jail, he believes he got hepatitis C from tattoo. His hepatitis C has not been treated.   He had episode of chest pain 3-4 time since 05/2015 and  and unbalnace gates and was seen in the ED.  He had an episode of bloody diarrhea once with melano a few weeks weeks ago. He was referred to see primary care physician Dr. Smith Robert after ED visit. He was subsequently referred to infection disease specialist, for untreated hepatitis C. Passively and ultrasound of abdomen was obtained on 10/19/2015, which showed a 2.7 cm right hepatic lobe region, suspicious for hepatocellular carcinoma. He subsequently underwent a liver MRI at a [facility] last week.  He feels well overall, has good appetie and eats small meals, no reently weight loss. He denies significant abdominal discomfort, bloating, nausea, or other symptoms. His bowel movement is normal, he denies history of GI bleeding, melena or hematochezia. He has a mild fatigue, but able to tolerate routine activities.  He has history of alcohol abuse, still drinks half pint a day, and also smoke half pack a day.  MEDICAL HISTORY:  Past Medical History  Diagnosis Date  . Hepatitis C   . Alcohol abuse   . Arthritis   . Asthma   . Cancer [facility])     SURGICAL HISTORY: Past Surgical History  Procedure Laterality Date  . Skin graft  1991    Left Hand    SOCIAL HISTORY: Social History   Social History  . Marital Status: Married      Spouse Name: [name]  . Number of Children: 8  . Years of Education: [name]   Occupational History  . Not on file.   Social History Main Topics  . Smoking status: Current Every Day Smoker -- 0.50 packs/day for 45 years    Types: Cigarettes  . Smokeless tobacco: Never Used     Comment: cutting back  . Alcohol Use: 0.0 oz/week    0 Standard drinks or equivalent per week     Comment: "a 1/2 pint a day"  . Drug Use: No  . Sexual Activity: Not on file   Other Topics Concern  . Not on file   Social History Narrative    FAMILY HISTORY: Family History  Problem Relation Age of Onset  . Hypertension Mother   . Diabetes Mother   . Heart disease Mother   . Diabetes Sister   . Diabetes Father   . Cancer Brother     Liver  . Alzheimer's disease Mother     ALLERGIES:  is allergic to aspirin.  MEDICATIONS:  Current Outpatient Prescriptions  Medication Sig Dispense Refill  . IRON, FERROUS GLUCONATE, PO Take by mouth daily.    . Na Sulfate-K Sulfate-Mg Sulf SOLN Take 1 kit by mouth once. 354 mL 0  . ribavirin (REBETOL) 200 MG capsule Take 3 caps with breakfast and 2 caps with dinner 140 capsule 2  . Sofosbuvir-Velpatasvir (EPCLUSA) 400-100 MG  TABS Take 1 tablet by mouth daily. 28 tablet 2  . vitamin B-12 (CYANOCOBALAMIN) 1000 MCG tablet Take 1,000 mcg by mouth daily.     No current facility-administered medications for this visit.    REVIEW OF SYSTEMS:   Constitutional: Denies fevers, chills or abnormal night sweats Eyes: Denies blurriness of vision, double vision or watery eyes Ears, nose, mouth, throat, and face: Denies mucositis or sore throat Respiratory: Denies cough, dyspnea or wheezes Cardiovascular: Denies palpitation, chest discomfort or lower extremity swelling Gastrointestinal:  Denies nausea, heartburn or change in bowel habits Skin: Denies abnormal skin rashes Lymphatics: Denies new lymphadenopathy or easy bruising Neurological:Denies numbness, tingling or new  weaknesses Behavioral/Psych: Mood is stable, no new changes  All other systems were reviewed with the patient and are negative.  PHYSICAL EXAMINATION: ECOG PERFORMANCE STATUS: 1 - Symptomatic but completely ambulatory  Filed Vitals:   11/13/15 1447  BP: 154/83  Pulse: 74  Temp: 98.5 F (36.9 C)  Resp: 18   Filed Weights   11/13/15 1447  Weight: 142 lb 1.6 oz (64.456 kg)    GENERAL:alert, no distress and comfortable SKIN: skin color, texture, turgor are normal, no rashes or significant lesions EYES: normal, conjunctiva are pink and non-injected, sclera clear OROPHARYNX:no exudate, no erythema and lips, buccal mucosa, and tongue normal  NECK: supple, thyroid normal size, non-tender, without nodularity LYMPH:  no palpable lymphadenopathy in the cervical, axillary or inguinal LUNGS: clear to auscultation and percussion with normal breathing effort HEART: regular rate & rhythm and no murmurs and no lower extremity edema ABDOMEN:abdomen soft, non-tender and normal bowel sounds, no organomegaly. Musculoskeletal:no cyanosis of digits and no clubbing  PSYCH: alert & oriented x 3 with fluent speech NEURO: no focal motor/sensory deficits  LABORATORY DATA:  I have reviewed the data as listed Lab Results  Component Value Date   WBC 4.1 10/30/2015   HGB 13.0 10/30/2015   HCT 38.3* 10/30/2015   MCV 101.1* 10/30/2015   PLT 115* 10/30/2015    Recent Labs  05/08/15 1847 06/11/15 1057 07/28/15 1012 08/21/15 1004 11/10/15 1135  NA  --  138 138 139  --   K  --  4.1 3.7 3.9  --   CL  --  104 103 105  --   CO2  --  _0 --   GLUCOSE  --  87 100* 113*  --   BUN  --  8 9 <5* 8  CREATININE  --  0.95 0.79 0.94 0.84  CALCIUM  --  8.6* 8.5* 8.6*  --   GFRNONAA  --  >60 >89 >60  --   GFRAA  --  >60 >89 >60  --   PROT 7.1 7.6 7.0 7.7  --   ALBUMIN 3.1* 2.5* 3.0* 2.6*  --   AST 361* 280* 227* 197*  --   ALT 180* 151* 113* 91*  --   ALKPHOS 128* 164* 153* 153*  --   BILITOT 5.1*  8.9* 4.5* 4.0*  --   BILIDIR 3.2*  --   --   --   --   IBILI 1.9*  --   --   --   --    AFP tumor marker  Status: Finalresult Visible to patient:  Not Released Nextappt: 12/15/2015 at 02:00 PM in Gastroenterology (PYRTLE, Lajuan Lines, MD) Dx:  Hepatocellular carcinoma ([place])            Ref Range 3d ago    AFP, Serum, Tumor Marker  0.0 - 8.3 ng/mL 101.7 (H)          RADIOGRAPHIC STUDIES: I have personally reviewed the radiological images as listed and agreed with the findings in the report. Ct Head W Wo Contrast  11/12/2015  CLINICAL DATA:  Memory loss. EXAM: CT HEAD WITHOUT AND WITH CONTRAST TECHNIQUE: Contiguous axial images were obtained from the base of the skull through the vertex without and with intravenous contrast CONTRAST:  50m OMNIPAQUE IOHEXOL 300 MG/ML  SOLN COMPARISON:  None. FINDINGS: There is mild generalized cerebral atrophy, borderline advanced for age. There is no evidence of acute cortical infarct, intracranial hemorrhage, mass, midline shift, or extra-axial fluid collection. Gray-white differentiation is preserved. No abnormal enhancement is identified. Orbits are unremarkable. The visualized paranasal sinuses and mastoid air cells are clear. Moderate bilateral carotid siphon atherosclerosis is noted. No suspicious skull lesion. IMPRESSION: 1. No acute intracranial abnormality or mass. 2. Mild cerebral atrophy. Electronically Signed   By: ALogan BoresM.D.   On: 11/12/2015 15:26   [place]domen Complete W/elastography  10/23/2015  CLINICAL DATA:  Hepatitis-C x 3 years. EXAM: ULTRASOUND ABDOMEN COMPLETE ULTRASOUND HEPATIC ELASTOGRAPHY TECHNIQUE: Sonography of the upper abdomen was performed. In addition, ultrasound elastography evaluation of the liver was performed. A region of interest was placed within the right lobe of the liver. Following application of a compressive sonographic pulse, shear waves were detected in the adjacent hepatic tissue and the shear wave  velocity was calculated. Multiple assessments were performed at the selected site. Median shear wave velocity is correlated to a Metavir fibrosis score. COMPARISON:  None. FINDINGS: ULTRASOUND ABDOMEN Gallbladder: Gallbladder has a normal appearance. Gallbladder wall is 1.6 mm, within normal limits. No stones or pericholecystic fluid. No sonographic Murphy's sign. Common bile duct: Diameter: 3.9 mm Liver: Liver is mildly echogenic. A hyperechoic lesion with hypoechoic halo is identified in the right hepatic lobe measuring 2.7 x 2.5 x 2.5 cm. IVC: No abnormality visualized. Pancreas: Visualized portion unremarkable. Spleen: Normal in appearance, 6.1 cm in craniocaudal length. Right Kidney: Length: 9.8 cm. Echogenicity within normal limits. No mass or hydronephrosis visualized. Left Kidney: Length: 10.0 cm. Echogenicity within normal limits. No hydronephrosis. Lower pole cyst is 0.9 x 0.9 x 0.7 cm. Abdominal aorta: No aneurysm visualized. Other findings: None. ULTRASOUND HEPATIC ELASTOGRAPHY Device: Siemens Helix VTQ Patient position: Supine Transducer 6 C1 Number of measurements:  10 Hepatic Segment:  Eight Median velocity:   4.31  m/sec IQR: 0.47 IQR/Median velocity ratio 0.019 Corresponding Metavir fibrosis score:  F3/ F4 Risk of fibrosis: High Limitations of exam: None Pertinent findings noted on other imaging exams:  None Please note that abnormal shear wave velocities may also be identified in clinical settings other than with hepatic fibrosis, such as: acute hepatitis, elevated right heart and central venous pressures including use of beta blockers, veno-occlusive disease (Budd-Chiari), infiltrative processes such as mastocytosis/amyloidosis/infiltrative tumor, extrahepatic cholestasis, in the post-prandial state, and liver transplantation. Correlation with patient history, laboratory data, and clinical condition recommended. IMPRESSION: 1. Mildly echogenic liver. 2. Right hepatic lobe lesion measuring 2.7  centimeters is suspicious for hepatocellular carcinoma. Further characterization with outpatient liver protocol MRI is recommended. 3. Median hepatic shear wave velocity is calculated at 4.31 m/sec. 4. Corresponding Metavir fibrosis score is F3/ F4 5. Risk of fibrosis is high. 6. Follow-up:  Follow-up is advised. 7. These results will be called to the ordering clinician or representative by the Radiologist Assistant, and communication documented in the PACS or zVision Dashboard. Electronically Signed  By: Nolon Nations M.D.   On: 10/23/2015 10:35   Abdomen MRI w and wo contrast 11/06/2015 [facility] images) Impression: Fact containing mass in the liver, measuring 2.4 x 2.3 cm in the central of Rieke lobe. Post hepatocellular carcinoma and adenomas can containing fat.   ASSESSMENT & PLAN:  65 year old African-American male, with past medical history of untreated hepatitis C, drug and alcohol abuse, liver cirrhosis, presented with a image discovered liver mass.  1. Liver mass in right lobe, rule out [facility] -I reviewed his outside liver MRI report, and I asked patient to bring the image copy to me as soon as possible. His wife agrees to do so. -Given his underlying chronic hepatitis C, untreated, and liver cirrhosis, he is at high risk for hepatocellular carcinoma. -His AFP is significantly elevated, which is also suspicious for HCC. -I'll review his outside MRI image in our GI tumor Board next week, to see if we need tissue biopsy to confirm the diagnosis. -I discussed the treatment option for early stage HCC. Given his moderate liver cirrhosis, child Pugh class B, he may not be a good candidate for surgical resection. He may also does not appear candidate for liver cirrhosis, since he still drinks alcohol moderately. I discussed the option of liver targeted therapy, such as microwave ablation.  -repeat lab today -I will give him a call after reviewing his MRI images and tumor board discussion next week. I  will make referral (surgery or IR) afterwards.  -I will see him back if needed in future.  2. Untreated hepatitis C, liver cirrhosis, Child Pugh class B (9) -He will continue follow-up with Dr.Comer for hepatitis C treatment -He is scheduled to see gastroenterologist Dr. Tonye Royalty next week.   3. Substance abuse (alcohol and smoking) -I strongly encourage him to stop drinking alcohol and quit smoking. He voiced good understanding, and is willing to try.  Plan -Lab today -I'll review his outside abdominal MRI if his wife drops off the copy of the image -GI tumor board discussion next week if we have the MRI images, I will call him after the conference.   All questions were answered. The patient knows to call the clinic with any problems, questions or concerns. I spent 55 minutes counseling the patient face to face. The total time spent in the appointment was 60 minutes and more than 50% was on counseling.     Truitt Merle, MD 11/13/2015 8:21 AM    Addendum:  Case discussed in GI tumor board. The outside MRI images reviewed, we recommend liver mass biopsy to confirm [facility], given the lack of arterial enhancement. I will call pt and arrange his biopsy. If confirm [facility], will recommend liver mass ablation by IR, not a good candidate for surgical resection.  Truitt Merle  11/19/2015

## 2015-11-14 ENCOUNTER — Telehealth: Payer: Self-pay | Admitting: *Deleted

## 2015-11-14 ENCOUNTER — Telehealth: Payer: Self-pay | Admitting: Family Medicine

## 2015-11-14 LAB — AFP TUMOR MARKER: AFP, Serum, Tumor Marker: 101.7 ng/mL — ABNORMAL HIGH (ref 0.0–8.3)

## 2015-11-14 NOTE — Telephone Encounter (Signed)
Notified patient and his wife of results.

## 2015-11-14 NOTE — Telephone Encounter (Signed)
I notified patient and his wife of result.

## 2015-11-14 NOTE — Telephone Encounter (Signed)
-----   Message from Cameron Sprang, MD sent at 11/14/2015  8:39 AM EST ----- Pls let patient know brain looks overall good, no evidence of tumor, stroke, or bleed. It shows age-related changes. Thanks

## 2015-11-14 NOTE — Telephone Encounter (Signed)
-----   Message from Cameron Sprang, MD sent at 11/14/2015  8:36 AM EST ----- Pls let him know B12 and thyroid are normal. Thanks

## 2015-11-14 NOTE — Telephone Encounter (Signed)
  Oncology Nurse Navigator Documentation  Navigator Location: [facility] (11/14/15 1649) Navigator Encounter Type: Telephone (11/14/15 1649) Telephone: Outgoing Call (11/14/15 1649)  Called to follow up on when they will bring the CD from his [facility] CT scan. She will bring it Monday.

## 2015-11-16 ENCOUNTER — Encounter: Payer: Self-pay | Admitting: Hematology

## 2015-11-17 ENCOUNTER — Inpatient Hospital Stay
Admission: RE | Admit: 2015-11-17 | Discharge: 2015-11-17 | Disposition: A | Discharge status: Home / Self Care | Payer: Self-pay | Source: Ambulatory Visit | Attending: Hematology | Admitting: Hematology

## 2015-11-17 ENCOUNTER — Other Ambulatory Visit: Payer: Self-pay | Admitting: Hematology

## 2015-11-17 DIAGNOSIS — C801 Malignant (primary) neoplasm, unspecified: Secondary | ICD-10-CM

## 2015-11-18 ENCOUNTER — Encounter: Payer: Self-pay | Admitting: Pharmacy Technician

## 2015-11-19 ENCOUNTER — Telehealth: Payer: Self-pay | Admitting: *Deleted

## 2015-11-19 ENCOUNTER — Telehealth: Payer: Self-pay | Admitting: Hematology

## 2015-11-19 DIAGNOSIS — C220 Liver cell carcinoma: Secondary | ICD-10-CM

## 2015-11-19 NOTE — Addendum Note (Signed)
Addended by: Truitt Merle on: 11/19/2015 08:28 AM   Modules accepted: Orders

## 2015-11-19 NOTE — Telephone Encounter (Signed)
Oncology Nurse Navigator Documentation  Oncology Nurse Navigator Flowsheets 11/19/2015  Navigator Location [facility]  Navigator Encounter Type Telephone  Telephone Outgoing Call;Patient Update  Abnormal Finding Date -  Time Spent with Patient 5  Informed patient of tumor board discussion today. Need to biopsy his liver mass and then may be able to have ablation to kill tumor. He will be seen by Dr. Burr Medico after the biopsy. He agrees to this plan.

## 2015-11-19 NOTE — Telephone Encounter (Signed)
per pof to sch pt appt-cld pt and left message for appt time & date 2/14

## 2015-11-20 ENCOUNTER — Telehealth: Payer: Self-pay | Admitting: *Deleted

## 2015-11-20 ENCOUNTER — Encounter: Payer: Self-pay | Admitting: *Deleted

## 2015-11-20 NOTE — Telephone Encounter (Signed)
Oncology Nurse Navigator Documentation  Oncology Nurse Navigator Flowsheets 11/20/2015  Navigator Location [facility]  Navigator Encounter Type Telephone  Telephone Outgoing Call  Abnormal Finding Date -  Barriers/Navigation Needs Education  Education Preparing for Upcoming Biopsy  Acuity Level 1  Time Spent with Patient 15  Returned call due to message from scheduler that patient did not understand what this RN told him about current tx plan. Spoke with his new wife (10/09/15) Levada Dy and explained need for biopsy of liver mass and then IR could most likely do ablation procedure (explained this to her also). Will call with follow up with Dr. Burr Medico after the biopsy to discuss result. She asked that she be the one called with all his medical care and appointments since he can forget and has limited understanding. Patient got on the phone and told this RN that he wants his wife to be first contact for him.

## 2015-11-21 ENCOUNTER — Other Ambulatory Visit: Payer: Self-pay | Admitting: Hematology

## 2015-11-21 DIAGNOSIS — C220 Liver cell carcinoma: Secondary | ICD-10-CM

## 2015-11-25 ENCOUNTER — Other Ambulatory Visit: Payer: Self-pay | Admitting: Radiology

## 2015-11-26 ENCOUNTER — Ambulatory Visit (HOSPITAL_COMMUNITY)
Admission: RE | Admit: 2015-11-26 | Discharge: 2015-11-26 | Disposition: A | Discharge status: Home / Self Care | Payer: Medicaid Other | Source: Ambulatory Visit | Attending: Hematology | Admitting: Hematology

## 2015-11-26 ENCOUNTER — Encounter (HOSPITAL_COMMUNITY): Payer: Self-pay

## 2015-11-26 DIAGNOSIS — F1010 Alcohol abuse, uncomplicated: Secondary | ICD-10-CM | POA: Insufficient documentation

## 2015-11-26 DIAGNOSIS — C220 Liver cell carcinoma: Secondary | ICD-10-CM | POA: Insufficient documentation

## 2015-11-26 DIAGNOSIS — B1920 Unspecified viral hepatitis C without hepatic coma: Secondary | ICD-10-CM | POA: Insufficient documentation

## 2015-11-26 HISTORY — DX: Reserved for inherently not codable concepts without codable children: IMO0001

## 2015-11-26 LAB — APTT: aPTT: 34 seconds (ref 24–37)

## 2015-11-26 LAB — CBC
HCT: 40.1 % (ref 39.0–52.0)
Hemoglobin: 13.7 g/dL (ref 13.0–17.0)
MCH: 34.9 pg — ABNORMAL HIGH (ref 26.0–34.0)
MCHC: 34.2 g/dL (ref 30.0–36.0)
MCV: 102 fL — ABNORMAL HIGH (ref 78.0–100.0)
Platelets: 100 10*3/uL — ABNORMAL LOW (ref 150–400)
RBC: 3.93 MIL/uL — ABNORMAL LOW (ref 4.22–5.81)
RDW: 14.4 % (ref 11.5–15.5)
WBC: 6.1 10*3/uL (ref 4.0–10.5)

## 2015-11-26 LAB — PROTIME-INR
INR: 1.27 (ref 0.00–1.49)
Prothrombin Time: 15.6 seconds — ABNORMAL HIGH (ref 11.6–15.2)

## 2015-11-26 MED ORDER — FENTANYL CITRATE (PF) 100 MCG/2ML IJ SOLN
INTRAMUSCULAR | Status: AC
  Filled 2015-11-26: qty 4

## 2015-11-26 MED ORDER — MIDAZOLAM HCL 2 MG/2ML IJ SOLN
INTRAMUSCULAR | Status: AC
  Filled 2015-11-26: qty 6

## 2015-11-26 MED ORDER — MIDAZOLAM HCL 2 MG/2ML IJ SOLN
INTRAMUSCULAR | Status: AC | PRN
  Administered 2015-11-26 (×4): 1 mg via INTRAVENOUS

## 2015-11-26 MED ORDER — FENTANYL CITRATE (PF) 100 MCG/2ML IJ SOLN
INTRAMUSCULAR | Status: AC | PRN
  Administered 2015-11-26: 25 ug via INTRAVENOUS
  Administered 2015-11-26: 50 ug via INTRAVENOUS

## 2015-11-26 MED ORDER — SODIUM CHLORIDE 0.9 % IV SOLN
Freq: Once | INTRAVENOUS | Status: AC
  Administered 2015-11-26: 11:00:00 via INTRAVENOUS

## 2015-11-26 NOTE — Procedures (Signed)
Technically successful US guided biopsy of indeterminate lesion in right lobe of liver.   No immediate complications.   Ronny Bacon, MD Pager #: [phone]

## 2015-11-26 NOTE — Sedation Documentation (Signed)
Patient denies pain and is resting comfortably.  

## 2015-11-26 NOTE — Discharge Instructions (Signed)
Liver Biopsy The liver is a large organ in the upper right-hand side of your abdomen. A liver biopsy is a procedure in which a tissue sample is taken from the liver and examined under a microscope. The procedure is done to confirm a suspected problem. There are three types of liver biopsies:  Percutaneous. In this type, an incision is made in your abdomen. The sample is removed through the incision with a needle.  Laparoscopic. In this type, several incisions are made in the abdomen. A tiny camera is passed through one of the incisions to help guide the health care provider. The sample is removed through the other incision or incisions.  Transjugular. In this type, an incision is made in the neck. A tube is passed through the incision to the liver. The sample is removed through the tube with a needle. LET [facility] CARE PROVIDER KNOW ABOUT:  Any allergies you have.  All medicines you are taking, including vitamins, herbs, eye drops, creams, and over-the-counter medicines.  Previous problems you or members of your family have had with the use of anesthetics.  Any blood disorders you have.  Previous surgeries you have had.  Medical conditions you have.  Possibility of pregnancy, if this applies. RISKS AND COMPLICATIONS Generally, this is a safe procedure. However, problems can occur and include:  Bleeding.  Infection.  Bruising.  Collapsed lung.  Leak of digestive juices (bile) from the liver or gallbladder.  Problems with heart rhythm.  Pain at the biopsy site or in the right shoulder.  Low blood pressure (hypotension).  Injury to nearby organs or tissues. BEFORE THE PROCEDURE  Your health care provider may do some blood or urine tests. These will help your health care provider learn how well your kidneys and liver are working and how well your blood clots.  Ask your health care provider if you will be able to go home the day of the procedure. Arrange for someone to  take you home and stay with you for at least 24 hours.  Do not eat or drink anything after midnight on the night before the procedure or as directed by your health care provider.  Ask your health care provider about:  Changing or stopping your regular medicines. This is especially important if you are taking diabetes medicines or blood thinners.  Taking medicines such as aspirin and ibuprofen. These medicines can thin your blood. Do not take these medicines before your procedure if your health care provider asks you not to. PROCEDURE Regardless of the type of biopsy that will be done, you will have an IV line placed. Through this line, you will receive fluids and medicine to relax you. If you will be having a laparoscopic biopsy, you may also receive medicine through this line to make you sleep during the procedure (general anesthetic). Percutaneous Liver Biopsy  You will positioned on your back, with your right hand over your head.  A health care provider will locate your liver by tapping and pressing on the right side of your abdomen or with the help of an ultrasound machine or CT scan.  An area at the bottom of your last right rib will be numbed.  An incision will be made in the numbed area.  The biopsy needle will be inserted into the incision.  Several samples of liver tissue will be taken with the biopsy needle. You will be asked to hold your breath as each sample is taken. Laparoscopic Liver Biopsy  You will be  positioned on your back.  Several small incisions will be made in your abdomen.  Your doctor will pass a tiny camera through one incision. The camera will allow the liver to be viewed on a TV monitor in the operating room.  Tools will be passed through the other incision or incisions. These tools will be used to remove samples of liver tissue. Transjugular Liver Biopsy  You will be positioned on your back on an X-ray table, with your head turned to your left.  An  area on your neck just over your jugular vein will be numbed.  An incision will be made in the numbed area.  A tiny tube will be inserted through the incision. It will be pushed through the jugular vein to a blood vessel in the liver called the hepatic vein.  Dye will be inserted through the tube, and X-rays will be taken. The dye will make the blood vessels in the liver light up on the X-rays.  The biopsy needle will be pushed through the tube until it reaches the liver.  Samples of liver tissue will be taken with the biopsy needle.  The needle and the tube will be removed. After the samples are obtained, the incision or incisions will be closed. AFTER THE PROCEDURE  You will be taken to a recovery area.  You may have to lie on your right side for 1-2 hours. This will prevent bleeding from the biopsy site.  Your progress will be watched. Your blood pressure, pulse, and the biopsy site will be checked often.  You may have some pain or feel sick. If this happens, tell your health care provider.  As you begin to feel better, you will be offered ice and beverages.  You may be allowed to go home when the medicines have worn off and you can walk, drink, eat, and use the bathroom.   This information is not intended to replace advice given to you by your health care provider. Make sure you discuss any questions you have with your health care provider.   Document Released: 12/25/2003 Document Revised: 10/25/2014 Document Reviewed: 11/30/2013 Elsevier Interactive Patient Education 2016 [employer] Liver Biopsy, Care After Refer to this sheet in the next few weeks. These instructions provide you with information on caring for yourself after your procedure. Your health care provider may also give you more specific instructions. Your treatment has been planned according to current medical practices, but problems sometimes occur. Call your health care provider if you have any problems or  questions after your procedure. WHAT TO EXPECT AFTER THE PROCEDURE After your procedure, it is typical to have the following:  A small amount of discomfort in the area where the biopsy was done and in the right shoulder or shoulder blade.  A small amount of bruising around the area where the biopsy was done and on the skin over the liver.  Sleepiness and fatigue for the rest of the day. HOME CARE INSTRUCTIONS   Rest at home for 1-2 days or as directed by your health care provider.  Have a friend or family member stay with you for at least 24 hours.  Because of the medicines used during the procedure, you should not do the following things in the first 24 hours:  Drive.  Use machinery.  Be responsible for the care of other people.  Sign legal documents.  Take a bath or shower.  There are many different ways to close and cover an incision, including stitches,  skin glue, and adhesive strips. Follow your health care provider's instructions on:  Incision care.  Bandage (dressing) changes and removal.  Incision closure removal.  Do not drink alcohol in the first week.  Do not lift more than 5 pounds or play contact sports for 2 weeks after this test.  Take medicines only as directed by your health care provider. Do not take medicine containing aspirin or non-steroidal anti-inflammatory medicines such as ibuprofen for 1 week after this test.  It is your responsibility to get your test results. SEEK MEDICAL CARE IF:   You have increased bleeding from an incision that results in more than a small spot of blood.  You have redness, swelling, or increasing pain in any incisions.  You notice a discharge or a bad smell coming from any of your incisions.  You have a fever or chills. SEEK IMMEDIATE MEDICAL CARE IF:   You develop swelling, bloating, or pain in your abdomen.  You become dizzy or faint.  You develop a rash.  You are nauseous or vomit.  You have difficulty  breathing, feel short of breath, or feel faint.  You develop chest pain.  You have problems with your speech or vision.  You have trouble balancing or moving your arms or legs.   This information is not intended to replace advice given to you by your health care provider. Make sure you discuss any questions you have with your health care provider.   Document Released: 04/23/2005 Document Revised: 10/25/2014 Document Reviewed: 11/30/2013 Elsevier Interactive Patient Education 2016 Elsevier Inc. Moderate Conscious Sedation, Adult Sedation is the use of medicines to promote relaxation and relieve discomfort and anxiety. Moderate conscious sedation is a type of sedation. Under moderate conscious sedation you are less alert than normal but are still able to respond to instructions or stimulation. Moderate conscious sedation is used during short medical and dental procedures. It is milder than deep sedation or general anesthesia and allows you to return to your regular activities sooner. LET [facility] CARE PROVIDER KNOW ABOUT:   Any allergies you have.  All medicines you are taking, including vitamins, herbs, eye drops, creams, and over-the-counter medicines.  Use of steroids (by mouth or creams).  Previous problems you or members of your family have had with the use of anesthetics.  Any blood disorders you have.  Previous surgeries you have had.  Medical conditions you have.  Possibility of pregnancy, if this applies.  Use of cigarettes, alcohol, or illegal drugs. RISKS AND COMPLICATIONS Generally, this is a safe procedure. However, as with any procedure, problems can occur. Possible problems include:  Oversedation.  Trouble breathing on your own. You may need to have a breathing tube until you are awake and breathing on your own.  Allergic reaction to any of the medicines used for the procedure. BEFORE THE PROCEDURE  You may have blood tests done. These tests can help show  how well your kidneys and liver are working. They can also show how well your blood clots.  A physical exam will be done.  Only take medicines as directed by your health care provider. You may need to stop taking medicines (such as blood thinners, aspirin, or nonsteroidal anti-inflammatory drugs) before the procedure.   Do not eat or drink at least 6 hours before the procedure or as directed by your health care provider.  Arrange for a responsible adult, family member, or friend to take you home after the procedure. He or she should stay with  you for at least 24 hours after the procedure, until the medicine has worn off. PROCEDURE   An intravenous (IV) catheter will be inserted into one of your veins. Medicine will be able to flow directly into your body through this catheter. You may be given medicine through this tube to help prevent pain and help you relax.  The medical or dental procedure will be done. AFTER THE PROCEDURE  You will stay in a recovery area until the medicine has worn off. Your blood pressure and pulse will be checked.   Depending on the procedure you had, you may be allowed to go home when you can tolerate liquids and your pain is under control.   This information is not intended to replace advice given to you by your health care provider. Make sure you discuss any questions you have with your health care provider.   Document Released: 06/29/2001 Document Revised: 10/25/2014 Document Reviewed: 06/11/2013 Elsevier Interactive Patient Education [employer].

## 2015-11-26 NOTE — H&P (Signed)
Chief Complaint: Patient was seen in consultation today for ultrasound-guided liver lesion biopsy Referring Physician(s): Feng,Yan  History of Present Illness: Peter Nelson is a 65 y.o. male with history of alcohol abuse, hepatitis C, elevated AFP of 101.7, cirrhosis and recently noted 2.7 cm right hepatic lobe lesion on outside imaging study. He presents today for ultrasound-guided liver lesion biopsy.  Past Medical History  Diagnosis Date  . Hepatitis C   . Alcohol abuse   . Arthritis   . Asthma   . Cancer ([place])   . Shortness of breath dyspnea     with activity and anxiety    Past Surgical History  Procedure Laterality Date  . Skin graft  1991    Left Hand    Allergies: Aspirin  Medications: Prior to Admission medications   Medication Sig Start Date End Date Taking? Authorizing Provider  IRON, FERROUS GLUCONATE, PO Take by mouth daily. Reported on 11/13/2015    Historical Provider, MD  Na Sulfate-K Sulfate-Mg Sulf SOLN Take 1 kit by mouth once. Patient not taking: Reported on 11/13/2015 11/11/15 12/11/15  Amy S Esterwood, PA-C  ribavirin (REBETOL) 200 MG capsule Take 3 caps with breakfast and 2 caps with dinner 11/12/15   Thayer Headings, MD  Sofosbuvir-Velpatasvir (EPCLUSA) 400-100 MG TABS Take 1 tablet by mouth daily. 11/12/15   Thayer Headings, MD  vitamin B-12 (CYANOCOBALAMIN) 1000 MCG tablet Take 1,000 mcg by mouth daily.    Historical Provider, MD     Family History  Problem Relation Age of Onset  . Hypertension Mother   . Diabetes Mother   . Heart disease Mother   . Alzheimer's disease Mother   . Diabetes Sister   . Diabetes Father   . Cancer Brother     Liver    Social History   Social History  . Marital Status: Married    Spouse Name: [name]  . Number of Children: 85  . Years of Education: [name]   Social History Main Topics  . Smoking status: Current Every Day Smoker -- 0.50 packs/day for 45 years    Types: Cigarettes  . Smokeless tobacco: Never  Used     Comment: cutting back  . Alcohol Use: 0.0 oz/week    0 Standard drinks or equivalent per week     Comment: "a 1/2 pint a day" for more than 30 years, average 1 pint a day, cut back lately   . Drug Use: No  . Sexual Activity: Not on file   Other Topics Concern  . Not on file   Social History Narrative   Married Dec 22nd, 2016 to Rule   Has #9 children      Review of Systems  Constitutional: Negative for fever and chills.  Respiratory: Negative for cough and shortness of breath.   Cardiovascular:       Occ left chest discomfort without radiation/assoc diaphoresis or nausea  Gastrointestinal: Negative for nausea, vomiting, abdominal pain and blood in stool.  Genitourinary: Negative for dysuria and hematuria.  Musculoskeletal: Positive for back pain.  Neurological:       Occ HA's  Psychiatric/Behavioral: The patient is nervous/anxious.     Vital Signs: BP 154/81 mmHg  Pulse 82  Temp(Src) 99 F (37.2 C) (Oral)  Resp 18  SpO2 100%  Physical Exam  Constitutional: He is oriented to person, place, and time. He appears well-developed and well-nourished.  Cardiovascular: Normal rate and regular rhythm.   Pulmonary/Chest: Effort normal and breath  sounds normal.  Abdominal: Soft. Bowel sounds are normal. There is no tenderness.  Musculoskeletal: Normal range of motion. He exhibits no edema.  Neurological: He is alert and oriented to person, place, and time.    Mallampati Score:     Imaging: Ct Head W Wo Contrast  11/12/2015  CLINICAL DATA:  Memory loss. EXAM: CT HEAD WITHOUT AND WITH CONTRAST TECHNIQUE: Contiguous axial images were obtained from the base of the skull through the vertex without and with intravenous contrast CONTRAST:  24m OMNIPAQUE IOHEXOL 300 MG/ML  SOLN COMPARISON:  None. FINDINGS: There is mild generalized cerebral atrophy, borderline advanced for age. There is no evidence of acute cortical infarct, intracranial hemorrhage, mass, midline shift,  or extra-axial fluid collection. Gray-white differentiation is preserved. No abnormal enhancement is identified. Orbits are unremarkable. The visualized paranasal sinuses and mastoid air cells are clear. Moderate bilateral carotid siphon atherosclerosis is noted. No suspicious skull lesion. IMPRESSION: 1. No acute intracranial abnormality or mass. 2. Mild cerebral atrophy. Electronically Signed   By: ALogan BoresM.D.   On: 11/12/2015 15:26   Mr [employer]  11/17/2015  CLINICAL DATA:  This exam is stored here for comparison purposes only and was performed at an outside facility.   Please contact the originating institution for any associated interpretation or report.    Labs:  CBC:  Recent Labs  07/28/15 1012 08/21/15 1004 10/30/15 1032 11/13/15 1610  WBC 3.7* 3.9* 4.1 4.9  HGB 13.3 13.6 13.0 13.4  HCT 39.9 38.2* 38.3* 38.1*  PLT 116* 97* 115* 92 Large platelets present*    COAGS:  Recent Labs  05/08/15 2051 06/11/15 1057 08/21/15 1004 11/13/15 1611  INR 1.07 1.25 1.18 1.10*  APTT  --  32 36  --     BMP:  Recent Labs  05/08/15 1640 06/11/15 1057 07/28/15 1012 08/21/15 1004 11/10/15 1135 11/13/15 1610  NA 138 138 138 139  --  140  K 3.8 4.1 3.7 3.9  --  3.7  CL 106 104 103 105  --   --   CO2 _0 --  25  GLUCOSE 132* 87 100* 113*  --  79  BUN _1 <5* 8 7.6  CALCIUM 8.5* 8.6* 8.5* 8.6*  --  8.4  CREATININE 0.80 0.95 0.79 0.94 0.84 0.9  GFRNONAA >60 >60 >89 >60  --   --   GFRAA >60 >60 >89 >60  --   --     LIVER FUNCTION TESTS:  Recent Labs  06/11/15 1057 07/28/15 1012 08/21/15 1004 11/13/15 1610  BILITOT 8.9* 4.5* 4.0* 4.41*  AST 280* 227* 197* 192*  ALT 151* 113* 91* 87*  ALKPHOS 164* 153* 153* 180*  PROT 7.6 7.0 7.7 8.0  ALBUMIN 2.5* 3.0* 2.6* 2.7*    TUMOR MARKERS:  Recent Labs  10/30/15 1032  AFPTM 95.2*    Assessment and Plan: 65y.o. male with history of alcohol abuse, hepatitis C, elevated AFP of 101.7,  cirrhosis and recently noted 2.7 cm right hepatic lobe lesion on outside imaging study. He presents today for ultrasound-guided liver lesion biopsy.Risks and benefits discussed with the patient/family including, but not limited to bleeding, infection, damage to adjacent structures or low yield requiring additional tests. All of the patient's questions were answered, patient is agreeable to proceed.Consent signed and in chart.     Thank you for this interesting consult.  I greatly enjoyed meeting Peter Nelson and look forward to participating in their  care.  A copy of this report was sent to the requesting provider on this date.  Electronically Signed: D. Rowe Robert 11/26/2015, 11:28 AM   I spent a total of 15 minutes in face to face in clinical consultation, greater than 50% of which was counseling/coordinating care for US guided liver lesion biopsy

## 2015-12-01 ENCOUNTER — Other Ambulatory Visit: Payer: Self-pay | Admitting: *Deleted

## 2015-12-02 ENCOUNTER — Telehealth: Payer: Self-pay | Admitting: Hematology

## 2015-12-02 ENCOUNTER — Ambulatory Visit (HOSPITAL_BASED_OUTPATIENT_CLINIC_OR_DEPARTMENT_OTHER): Payer: Self-pay | Admitting: Hematology

## 2015-12-02 ENCOUNTER — Telehealth: Payer: Self-pay | Admitting: Internal Medicine

## 2015-12-02 ENCOUNTER — Other Ambulatory Visit (HOSPITAL_BASED_OUTPATIENT_CLINIC_OR_DEPARTMENT_OTHER): Payer: Self-pay

## 2015-12-02 ENCOUNTER — Encounter: Payer: Self-pay | Admitting: Hematology

## 2015-12-02 ENCOUNTER — Other Ambulatory Visit: Payer: Self-pay | Admitting: *Deleted

## 2015-12-02 VITALS — BP 150/67 | HR 66 | Temp 98.2°F | Resp 18 | Ht 66.5 in | Wt 140.3 lb

## 2015-12-02 DIAGNOSIS — Z720 Tobacco use: Secondary | ICD-10-CM

## 2015-12-02 DIAGNOSIS — B190 Unspecified viral hepatitis with hepatic coma: Secondary | ICD-10-CM

## 2015-12-02 DIAGNOSIS — K7460 Unspecified cirrhosis of liver: Secondary | ICD-10-CM

## 2015-12-02 DIAGNOSIS — C220 Liver cell carcinoma: Secondary | ICD-10-CM

## 2015-12-02 DIAGNOSIS — F1021 Alcohol dependence, in remission: Secondary | ICD-10-CM

## 2015-12-02 LAB — CBC WITH DIFFERENTIAL/PLATELET
BASO%: 1.2 % (ref 0.0–2.0)
BASOS ABS: 0 10*3/uL (ref 0.0–0.1)
EOS ABS: 0.2 10*3/uL (ref 0.0–0.5)
EOS%: 4.4 % (ref 0.0–7.0)
HEMATOCRIT: 40.5 % (ref 38.4–49.9)
HEMOGLOBIN: 13.6 g/dL (ref 13.0–17.1)
LYMPH#: 1.1 10*3/uL (ref 0.9–3.3)
LYMPH%: 27.9 % (ref 14.0–49.0)
MCH: 35.1 pg — AB (ref 27.2–33.4)
MCHC: 33.5 g/dL (ref 32.0–36.0)
MCV: 104.8 fL — AB (ref 79.3–98.0)
MONO#: 0.6 10*3/uL (ref 0.1–0.9)
MONO%: 14.6 % — ABNORMAL HIGH (ref 0.0–14.0)
NEUT#: 2 10*3/uL (ref 1.5–6.5)
NEUT%: 51.9 % (ref 39.0–75.0)
Platelets: 98 10*3/uL — ABNORMAL LOW (ref 140–400)
RBC: 3.86 10*6/uL — ABNORMAL LOW (ref 4.20–5.82)
RDW: 14.7 % — AB (ref 11.0–14.6)
WBC: 3.9 10*3/uL — ABNORMAL LOW (ref 4.0–10.3)

## 2015-12-02 LAB — COMPREHENSIVE METABOLIC PANEL
ALBUMIN: 2.6 g/dL — AB (ref 3.5–5.0)
ALK PHOS: 139 U/L (ref 40–150)
ALT: 70 U/L — ABNORMAL HIGH (ref 0–55)
AST: 104 U/L — AB (ref 5–34)
Anion Gap: 8 mEq/L (ref 3–11)
BILIRUBIN TOTAL: 4.87 mg/dL — AB (ref 0.20–1.20)
BUN: 11.7 mg/dL (ref 7.0–26.0)
CALCIUM: 8.6 mg/dL (ref 8.4–10.4)
CO2: 24 mEq/L (ref 22–29)
Chloride: 108 mEq/L (ref 98–109)
Creatinine: 1 mg/dL (ref 0.7–1.3)
Glucose: 129 mg/dl (ref 70–140)
POTASSIUM: 3.9 meq/L (ref 3.5–5.1)
Sodium: 139 mEq/L (ref 136–145)
Total Protein: 8.6 g/dL — ABNORMAL HIGH (ref 6.4–8.3)

## 2015-12-02 MED ORDER — NA SULFATE-K SULFATE-MG SULF 17.5-3.13-1.6 GM/177ML PO SOLN: ORAL | Status: DC

## 2015-12-02 NOTE — Telephone Encounter (Signed)
Pt confirmed labs/ov per 02/14 POF, gave pt AVS and Calendar... KJ, put referral in workqueue for GI office for Liver .Marland KitchenMarland KitchenMarland KitchenMarland Kitchen

## 2015-12-02 NOTE — Progress Notes (Signed)
[place]  [phone] Fax:([phone]) [phone]  Clinic Follow Up Note   Patient Care Team: [name], FNP as PCP - General (Family Medicine) Thayer Headings, MD as Consulting Physician (Infectious Diseases) Truitt Merle, MD as Consulting Physician (Hematology) 12/02/2015   CHIEF COMPLAINTS:  Follow up [place]   HISTORY OF PRESENTING ILLNESS:  Peter Nelson 65 y.o. male with past medical history of appetite C, liver cirrhosis, is being referred by Dr.Comer for a newly discovered liver mass.    He was found to have Hep C about 3-4 years ago when he was in jail, he believes he got hepatitis C from tattoo. His hepatitis C has not been treated.   He had episode of chest pain 3-4 time since 05/2015 and  and unbalnace gates and was seen in the ED.  He had an episode of bloody diarrhea once with melano a few weeks weeks ago. He was referred to see primary care physician Dr. Smith Robert after ED visit. He was subsequently referred to infection disease specialist, for untreated hepatitis C. Passively and ultrasound of abdomen was obtained on 10/19/2015, which showed a 2.7 cm right hepatic lobe region, suspicious for hepatocellular carcinoma. He subsequently underwent a liver MRI at a [facility] last week.  He feels well overall, has good appetie and eats small meals, no reently weight loss. He denies significant abdominal discomfort, bloating, nausea, or other symptoms. His bowel movement is normal, he denies history of GI bleeding, melena or hematochezia. He has a mild fatigue, but able to tolerate routine activities.  He has history of alcohol abuse, still drinks half pint a day, and also smoke half pack a day.  CURRENT THERAPY: Pending   INTERIM HISTORY: [name] returns for follow-up. He is accompanied by his wife today. He underwent ultrasound-guided liver ma last week and tolerated the biopsy procedure well. He had a mild pain after  No other new complaints. He has  recently started HCV therapy by Dr. Flavia Shipper lately.   MEDICAL HISTORY:  Past Medical History  Diagnosis Date  . Hepatitis C   . Alcohol abuse   . Arthritis   . Asthma   . Cancer ([place])   . Shortness of breath dyspnea     with activity and anxiety    SURGICAL HISTORY: Past Surgical History  Procedure Laterality Date  . Skin graft  1991    Left Hand    SOCIAL HISTORY: Social History   Social History  . Marital Status: Married    Spouse Name: [name]  . Number of Children: 67  . Years of Education: [name]   Occupational History  . Not on file.   Social History Main Topics  . Smoking status: Current Every Day Smoker -- 0.50 packs/day for 45 years    Types: Cigarettes  . Smokeless tobacco: Never Used     Comment: cutting back  . Alcohol Use: 0.0 oz/week    0 Standard drinks or equivalent per week     Comment: "a 1/2 pint a day" for more than 30 years, average 1 pint a day, cut back lately   . Drug Use: No  . Sexual Activity: Not on file   Other Topics Concern  . Not on file   Social History Narrative   Married Dec 22nd, 2016 to [place]   Has #9 children    FAMILY HISTORY: Family History  Problem Relation Age of Onset  . Hypertension Mother   . Diabetes Mother   .  Heart disease Mother   . Alzheimer's disease Mother   . Diabetes Sister   . Diabetes Father   . Cancer Brother     Liver    ALLERGIES:  is allergic to aspirin.  MEDICATIONS:  Current Outpatient Prescriptions  Medication Sig Dispense Refill  . ribavirin (REBETOL) 200 MG capsule Take 3 caps with breakfast and 2 caps with dinner 140 capsule 2  . Sofosbuvir-Velpatasvir (EPCLUSA) 400-100 MG TABS Take 1 tablet by mouth daily. 28 tablet 2  . vitamin B-12 (CYANOCOBALAMIN) 1000 MCG tablet Take 1,000 mcg by mouth daily.    . Na Sulfate-K Sulfate-Mg Sulf SOLN Take as directed for colonoscopy prep. 354 mL 0   No current facility-administered medications for this visit.    REVIEW OF SYSTEMS:     Constitutional: Denies fevers, chills or abnormal night sweats Eyes: Denies blurriness of vision, double vision or watery eyes Ears, nose, mouth, throat, and face: Denies mucositis or sore throat Respiratory: Denies cough, dyspnea or wheezes Cardiovascular: Denies palpitation, chest discomfort or lower extremity swelling Gastrointestinal:  Denies nausea, heartburn or change in bowel habits Skin: Denies abnormal skin rashes Lymphatics: Denies new lymphadenopathy or easy bruising Neurological:Denies numbness, tingling or new weaknesses Behavioral/Psych: Mood is stable, no new changes  All other systems were reviewed with the patient and are negative.  PHYSICAL EXAMINATION: ECOG PERFORMANCE STATUS: 1 - Symptomatic but completely ambulatory  Filed Vitals:   12/02/15 1051  BP: 150/67  Pulse: 66  Temp: 98.2 F (36.8 C)  Resp: 18   Filed Weights   12/02/15 1051  Weight: 140 lb 4.8 oz (63.64 kg)    GENERAL:alert, no distress and comfortable SKIN: skin color, texture, turgor are normal, no rashes or significant lesions EYES: normal, conjunctiva are pink and non-injected, sclera clear OROPHARYNX:no exudate, no erythema and lips, buccal mucosa, and tongue normal  NECK: supple, thyroid normal size, non-tender, without nodularity LYMPH:  no palpable lymphadenopathy in the cervical, axillary or inguinal LUNGS: clear to auscultation and percussion with normal breathing effort HEART: regular rate & rhythm and no murmurs and no lower extremity edema ABDOMEN:abdomen soft, non-tender and normal bowel sounds, no organomegaly. Musculoskeletal:no cyanosis of digits and no clubbing  PSYCH: alert & oriented x 3 with fluent speech NEURO: no focal motor/sensory deficits  LABORATORY DATA:  I have reviewed the data as listed CBC Latest Ref Rng 12/02/2015 11/26/2015 11/13/2015  WBC 4.0 - 10.3 10e3/uL 3.9(L) 6.1 4.9  Hemoglobin 13.0 - 17.1 g/dL 13.6 13.7 13.4  Hematocrit 38.4 - 49.9 % 40.5 40.1 38.1(L)   Platelets 140 - 400 10e3/uL 98(L) 100(L) 92 Large platelets present(L)    Recent Labs  05/08/15 1847 06/11/15 1057 07/28/15 1012 08/21/15 1004 11/10/15 1135 11/13/15 1610 12/02/15 1024  NA  --  138 138 139  --  140 139  K  --  4.1 3.7 3.9  --  3.7 3.9  CL  --  104 103 105  --   --   --   CO2  --  25 26 24   --  25 24  GLUCOSE  --  87 100* 113*  --  79 129  BUN  --  8 9 <5* 8 7.6 11.7  CREATININE  --  0.95 0.79 0.94 0.84 0.9 1.0  CALCIUM  --  8.6* 8.5* 8.6*  --  8.4 8.6  GFRNONAA  --  >60 >89 >60  --   --   --   GFRAA  --  >60 >89 >60  --   --   --  PROT 7.1 7.6 7.0 7.7  --  8.0 8.6*  ALBUMIN 3.1* 2.5* 3.0* 2.6*  --  2.7* 2.6*  AST 361* 280* 227* 197*  --  192* 104*  ALT 180* 151* 113* 91*  --  87* 70*  ALKPHOS 128* 164* 153* 153*  --  180* 139  BILITOT 5.1* 8.9* 4.5* 4.0*  --  4.41* 4.87*  BILIDIR 3.2*  --   --   --   --   --   --   IBILI 1.9*  --   --   --   --   --   --    AFP tumor marker  Status: Finalresult Visible to patient:  Not Released Nextappt: 12/15/2015 at 02:00 PM in Gastroenterology (PYRTLE, Lajuan Lines, MD) Dx:  Hepatocellular carcinoma ([place])            Ref Range 3d ago    AFP, Serum, Tumor Marker 0.0 - 8.3 ng/mL 101.7 (H)          RADIOGRAPHIC STUDIES: I have personally reviewed the radiological images as listed and agreed with the findings in the report. Ct Head W Wo Contrast  11/12/2015  CLINICAL DATA:  Memory loss. EXAM: CT HEAD WITHOUT AND WITH CONTRAST TECHNIQUE: Contiguous axial images were obtained from the base of the skull through the vertex without and with intravenous contrast CONTRAST:  4m OMNIPAQUE IOHEXOL 300 MG/ML  SOLN COMPARISON:  None. FINDINGS: There is mild generalized cerebral atrophy, borderline advanced for age. There is no evidence of acute cortical infarct, intracranial hemorrhage, mass, midline shift, or extra-axial fluid collection. Gray-white differentiation is preserved. No abnormal enhancement is  identified. Orbits are unremarkable. The visualized paranasal sinuses and mastoid air cells are clear. Moderate bilateral carotid siphon atherosclerosis is noted. No suspicious skull lesion. IMPRESSION: 1. No acute intracranial abnormality or mass. 2. Mild cerebral atrophy. Electronically Signed   By: ALogan BoresM.D.   On: 11/12/2015 15:26   [place]  11/26/2015  INDICATION: History of hepatitis-C and alcohol abuse, now with indeterminate hepatic lesion. Please perform ultrasound-guided liver lesion biopsy for tissue diagnostic purposes. EXAM: ULTRASOUND GUIDED LIVER LESION BIOPSY COMPARISON:  Abdominal MRI - 11/06/2015 (report only, no images) ; abdominal ultrasound - 10/23/2015 MEDICATIONS: None ANESTHESIA/SEDATION: Moderate (conscious) sedation was employed during this procedure. A total of Versed 4 mg and Fentanyl 75 mcg was administered intravenously. Moderate Sedation Time: 15 minutes. The patient's level of consciousness and vital signs were monitored continuously by radiology nursing throughout the procedure under my direct supervision. COMPLICATIONS: SIR Level A - No therapy, no consequence. Procedure was complicated by development of a very tiny non enlarging asymptomatic subcapsular hematoma. The patient remained hemodynamically stable during prolonged observation within the short-stay unit. PROCEDURE: Informed written consent was obtained from the patient after a discussion of the risks, benefits and alternatives to treatment. The patient understands and consents the procedure. A timeout was performed prior to the initiation of the procedure. Ultrasound scanning was performed of the right upper abdominal quadrant demonstrates an approximately 2.9 by 2.7 cm mixed echogenic mass within the central aspect of the right lobe of the liver correlating with the lesion seen on preceding abdominal ultrasound and MRI. The procedure was planned. The right upper abdominal quadrant was prepped and draped in the  usual sterile fashion. The overlying soft tissues were anesthetized with 1% lidocaine with epinephrine. A 17 gauge, 6.8 cm co-axial needle was advanced into a peripheral aspect of the lesion. This was followed by 4 core  biopsies with an 18 gauge core device under direct ultrasound guidance. The coaxial needle tract was embolized with a small amount of Gel-Foam slurry and superficial hemostasis was obtained with manual compression. Postprocedural scanning demonstrated development of a very tiny non enlarging asymptomatic subcapsular hematoma. A dressing was placed. The patient tolerated the procedure well without immediate post procedural complication. IMPRESSION: Technically successful ultrasound guided core needle biopsy of dominant indeterminate approximately 2.9 cm lesion within the central aspect of the right lobe of the liver. The procedure was complicated by development of a tiny non enlarging asymptomatic subcapsular hematoma. The patient remained hemodynamically stable during prolonged observation in the short stay the unit. Electronically Signed   By: Sandi Mariscal M.D.   On: 11/26/2015 16:53   Mr [employer] Spine  11/17/2015  CLINICAL DATA:  This exam is stored here for comparison purposes only and was performed at an outside facility.   Please contact the originating institution for any associated interpretation or report.   Abdomen MRI w and wo contrast 11/06/2015 [facility] images) Impression: Fat containing mass in the liver, measuring 2.4 x 2.3 cm in the central of right lobe. Both hepatocellular carcinoma and adenomas can containing fat.   ASSESSMENT & PLAN:  65 year old African-American male, with past medical history of untreated hepatitis C, drug and alcohol abuse, liver cirrhosis, presented with a image discovered liver mass.  1. Hepatocellular carcinoma, cT1N0M0, stage I -is outside abdominal MRI images were reviewed in our GI . The imaging features  Are most consistent with  hepatocellular carcinoma, except the lack of arterial phase enhancement. -His AFP is significantly elevated, which is also suspicious for HCC. -i discussed his liver mass biopsy results with patient and his wife which confirmed [place]. -I discussed the treatment option for early stage HCC. Given his moderate liver cirrhosis, child Pugh class B, he is not a candidate for surgical resection. This was discussed at our tumor board. -I have refered him to IR for liver targeted therapy, such as microwave ablation, he will be seen tomorrow  -he has stopped alcohol drinking, I discussed the option of liver. I'll refer him to liver clinic NP Dawn, he agrees -we discussed the surveillance plan after liver targeted therapy  2. hepatitis C, liver cirrhosis, Child Pugh class B (9) -He will continue follow-up with Dr.Comer for hepatitis C treatment -He is scheduled to see gastroenterologist Dr. Tonye Royalty for colonoscopy soon   3. Substance abuse (alcohol and smoking) -he has stopped drinking alcohol recently. I strongly encourage him to quit smoking also. He voiced good understanding, and is willing to try.  Plan -He will see IR tomorrow  -refer him to liver clinic NP Dawn  -I will see him back in 4 months for follow up.   All questions were answered. The patient knows to call the clinic with any problems, questions or concerns. I spent 25 minutes counseling the patient face to face. The total time spent in the appointment was 30 minutes and more than 50% was on counseling.     Truitt Merle, MD 12/02/2015

## 2015-12-03 ENCOUNTER — Other Ambulatory Visit: Payer: Self-pay | Admitting: Interventional Radiology

## 2015-12-03 ENCOUNTER — Other Ambulatory Visit (HOSPITAL_COMMUNITY): Payer: Self-pay | Admitting: Interventional Radiology

## 2015-12-03 ENCOUNTER — Ambulatory Visit
Admission: RE | Admit: 2015-12-03 | Discharge: 2015-12-03 | Disposition: A | Discharge status: Home / Self Care | Payer: No Typology Code available for payment source | Source: Ambulatory Visit | Attending: Hematology | Admitting: Hematology

## 2015-12-03 ENCOUNTER — Other Ambulatory Visit: Payer: No Typology Code available for payment source

## 2015-12-03 ENCOUNTER — Ambulatory Visit
Payer: No Typology Code available for payment source | Admitting: Pharmacist Clinician (PhC)/ Clinical Pharmacy Specialist

## 2015-12-03 DIAGNOSIS — C220 Liver cell carcinoma: Secondary | ICD-10-CM

## 2015-12-03 DIAGNOSIS — B182 Chronic viral hepatitis C: Secondary | ICD-10-CM

## 2015-12-03 NOTE — Consult Note (Signed)
Chief Complaint: Recent diagnosis of hepatocellular carcinoma  Referring Physician(s): Feng,Yan  History of Present Illness: Peter Nelson is a 65 y.o. male with past medical history significant for asthma, alcohol abuse and hepatitis C who was found to have an indeterminate liver lesion on abdominal MRI performed 10/23/2015, confirmed on subsequent outside abdominal MRI performed 11/06/2015 for which the patient underwent a ultrasound-guided liver lesion biopsy on 11/26/2015 with pathology compatible with hepatocellular carcinoma. The patient presents today to the interventional radiology clinic for potential percutaneous treatment options. The patient is accompanied by his wife though serves as his own historian.  The patient recovered from the ultrasound guided biopsy without incident.   The patient is currently without complaint in regards to his hepatocellular carcinoma. He denies abdominal pain. Patient denies unintentional weight loss or weight gain. No change in appetite or energy level. No increased abdominal girth. No yellowing of the skin or eyes. The patient denies change in bowel or bladder function. He denies GI bleeding, melena or hematochezia.  The patient continues to drink a half a pint a day and smokes a half a pack of cigarettes per day.  Past Medical History  Diagnosis Date  . Hepatitis C   . Alcohol abuse   . Arthritis   . Asthma   . Cancer ([place])   . Shortness of breath dyspnea     with activity and anxiety    Past Surgical History  Procedure Laterality Date  . Skin graft  1991    Left Hand    Allergies: Aspirin  Medications: Prior to Admission medications   Medication Sig Start Date End Date Taking? Authorizing Provider  ribavirin (REBETOL) 200 MG capsule Take 3 caps with breakfast and 2 caps with dinner 11/12/15  Yes Thayer Headings, MD  Sofosbuvir-Velpatasvir (EPCLUSA) 400-100 MG TABS Take 1 tablet by mouth daily. 11/12/15  Yes Thayer Headings, MD    vitamin B-12 (CYANOCOBALAMIN) 1000 MCG tablet Take 1,000 mcg by mouth daily.   Yes Historical Provider, MD  Na Sulfate-K Sulfate-Mg Sulf SOLN Take as directed for colonoscopy prep. Patient not taking: Reported on 12/03/2015 12/02/15   Amy S Esterwood, PA-C     Family History  Problem Relation Age of Onset  . Hypertension Mother   . Diabetes Mother   . Heart disease Mother   . Alzheimer's disease Mother   . Diabetes Sister   . Diabetes Father   . Cancer Brother     Liver    Social History   Social History  . Marital Status: Married    Spouse Name: [name]  . Number of Children: 60  . Years of Education: [name]   Social History Main Topics  . Smoking status: Current Every Day Smoker -- 0.50 packs/day for 45 years    Types: Cigarettes  . Smokeless tobacco: Never Used     Comment: cutting back  . Alcohol Use: 0.0 oz/week    0 Standard drinks or equivalent per week     Comment: "a 1/2 pint a day" for more than 30 years, average 1 pint a day, cut back lately   . Drug Use: No  . Sexual Activity: Not on file   Other Topics Concern  . Not on file   Social History Narrative   Married Dec 22nd, 2016 to [place]   Has #9 children    ECOG Status: 0 - Asymptomatic  Review of Systems: A 12 point ROS discussed and pertinent positives  are indicated in the HPI above.  All other systems are negative.  Review of Systems  Constitutional: Negative for fever, activity change, fatigue and unexpected weight change.  Respiratory: Negative.   Cardiovascular: Negative.   Gastrointestinal: Negative for nausea, abdominal pain, diarrhea, constipation, blood in stool and abdominal distention.  Genitourinary: Negative.   Skin: Negative.   Psychiatric/Behavioral: Negative for confusion.    Vital Signs: BP 157/80 mmHg  Pulse 77  Temp(Src) 98.4 F (36.9 C) (Oral)  Resp 14  Ht 5' 6.5" (1.689 m)  Wt 143 lb 3.2 oz (64.955 kg)  BMI 22.77 kg/m2  SpO2 100%  Physical Exam  Constitutional: He  appears well-developed and well-nourished.  Eyes: Conjunctivae are normal.  Cardiovascular: Normal rate and regular rhythm.   Pulmonary/Chest: Effort normal and breath sounds normal.  Abdominal: Soft. Bowel sounds are normal. He exhibits no distension. There is no tenderness.  Nontender with palpation of the right upper abdominal quadrant.  Skin: Skin is warm and dry.  Psychiatric: He has a normal mood and affect. His behavior is normal.  Nursing note and vitals reviewed.     Imaging: Ct Head W Wo Contrast  11/12/2015  CLINICAL DATA:  Memory loss. EXAM: CT HEAD WITHOUT AND WITH CONTRAST TECHNIQUE: Contiguous axial images were obtained from the base of the skull through the vertex without and with intravenous contrast CONTRAST:  73m OMNIPAQUE IOHEXOL 300 MG/ML  SOLN COMPARISON:  None. FINDINGS: There is mild generalized cerebral atrophy, borderline advanced for age. There is no evidence of acute cortical infarct, intracranial hemorrhage, mass, midline shift, or extra-axial fluid collection. Gray-white differentiation is preserved. No abnormal enhancement is identified. Orbits are unremarkable. The visualized paranasal sinuses and mastoid air cells are clear. Moderate bilateral carotid siphon atherosclerosis is noted. No suspicious skull lesion. IMPRESSION: 1. No acute intracranial abnormality or mass. 2. Mild cerebral atrophy. Electronically Signed   By: ALogan BoresM.D.   On: 11/12/2015 15:26   [place]  11/26/2015  INDICATION: History of hepatitis-C and alcohol abuse, now with indeterminate hepatic lesion. Please perform ultrasound-guided liver lesion biopsy for tissue diagnostic purposes. EXAM: ULTRASOUND GUIDED LIVER LESION BIOPSY COMPARISON:  Abdominal MRI - 11/06/2015 (report only, no images) ; abdominal ultrasound - 10/23/2015 MEDICATIONS: None ANESTHESIA/SEDATION: Moderate (conscious) sedation was employed during this procedure. A total of Versed 4 mg and Fentanyl 75 mcg was administered  intravenously. Moderate Sedation Time: 15 minutes. The patient's level of consciousness and vital signs were monitored continuously by radiology nursing throughout the procedure under my direct supervision. COMPLICATIONS: SIR Level A - No therapy, no consequence. Procedure was complicated by development of a very tiny non enlarging asymptomatic subcapsular hematoma. The patient remained hemodynamically stable during prolonged observation within the short-stay unit. PROCEDURE: Informed written consent was obtained from the patient after a discussion of the risks, benefits and alternatives to treatment. The patient understands and consents the procedure. A timeout was performed prior to the initiation of the procedure. Ultrasound scanning was performed of the right upper abdominal quadrant demonstrates an approximately 2.9 by 2.7 cm mixed echogenic mass within the central aspect of the right lobe of the liver correlating with the lesion seen on preceding abdominal ultrasound and MRI. The procedure was planned. The right upper abdominal quadrant was prepped and draped in the usual sterile fashion. The overlying soft tissues were anesthetized with 1% lidocaine with epinephrine. A 17 gauge, 6.8 cm co-axial needle was advanced into a peripheral aspect of the lesion. This was followed by 4 core  biopsies with an 18 gauge core device under direct ultrasound guidance. The coaxial needle tract was embolized with a small amount of Gel-Foam slurry and superficial hemostasis was obtained with manual compression. Postprocedural scanning demonstrated development of a very tiny non enlarging asymptomatic subcapsular hematoma. A dressing was placed. The patient tolerated the procedure well without immediate post procedural complication. IMPRESSION: Technically successful ultrasound guided core needle biopsy of dominant indeterminate approximately 2.9 cm lesion within the central aspect of the right lobe of the liver. The procedure  was complicated by development of a tiny non enlarging asymptomatic subcapsular hematoma. The patient remained hemodynamically stable during prolonged observation in the short stay the unit. Electronically Signed   By: Sandi Mariscal M.D.   On: 11/26/2015 16:53   Mr [employer] Spine  11/17/2015  CLINICAL DATA:  This exam is stored here for comparison purposes only and was performed at an outside facility.   Please contact the originating institution for any associated interpretation or report.    Labs:  CBC:  Recent Labs  10/30/15 1032 11/13/15 1610 11/26/15 1113 12/02/15 1024  WBC 4.1 4.9 6.1 3.9*  HGB 13.0 13.4 13.7 13.6  HCT 38.3* 38.1* 40.1 40.5  PLT 115* 92 Large platelets present* 100* 98*    COAGS:  Recent Labs  06/11/15 1057 08/21/15 1004 11/13/15 1611 11/26/15 1113  INR 1.25 1.18 1.10* 1.27  APTT 32 36  --  34    BMP:  Recent Labs  05/08/15 1640 06/11/15 1057 07/28/15 1012 08/21/15 1004 11/10/15 1135 11/13/15 1610 12/02/15 1024  NA 138 138 138 139  --  140 139  K 3.8 4.1 3.7 3.9  --  3.7 3.9  CL 106 104 103 105  --   --   --   CO2 22 25 26 24   --  25 24  GLUCOSE 132* 87 100* 113*  --  79 129  BUN 10 8 9  <5* 8 7.6 11.7  CALCIUM 8.5* 8.6* 8.5* 8.6*  --  8.4 8.6  CREATININE 0.80 0.95 0.79 0.94 0.84 0.9 1.0  GFRNONAA >60 >60 >89 >60  --   --   --   GFRAA >60 >60 >89 >60  --   --   --     LIVER FUNCTION TESTS:  Recent Labs  07/28/15 1012 08/21/15 1004 11/13/15 1610 12/02/15 1024  BILITOT 4.5* 4.0* 4.41* 4.87*  AST 227* 197* 192* 104*  ALT 113* 91* 87* 70*  ALKPHOS 153* 153* 180* 139  PROT 7.0 7.7 8.0 8.6*  ALBUMIN 3.0* 2.6* 2.7* 2.6*    TUMOR MARKERS:  Recent Labs  10/30/15 1032  AFPTM 95.2*    Assessment and Plan:  CLAYBORN MILNES is a 65 y.o. male with past medical history significant for asthma, alcohol abuse and hepatitis C who was found to have an indeterminate liver lesion on abdominal MRI performed 10/23/2015, confirmed on  subsequent outside abdominal MRI performed 11/06/2015, for which the patient underwent a ultrasound-guided liver lesion biopsy on 11/26/2015 with pathology compatible with hepatocellular carcinoma.   Unfortunately the patient is not a candidate for either transplantation or resection as per Dr. Ernestina Penna consult note, his case had been discussed at GI tumor board and the patient was not deemed an operative candidate given his moderate liver cirrhosis and child Pugh class B characterization.  As such, prolonged conversations were held with the patient regarding additional percutaneous treatment options.  I explained to the patient, that ablative technology would offer him the highest likelihood  of local/regional control.  Presently the solitary lesion measures approximately 2.6 cm in maximal diameter on abdominal MRI performed 11/06/2015 and thus meet size criteria for ablation.  I explained that potential complications with ultrasound and CT-guided microwave ablation include bleeding, infection, injury to adjacent organs (most importantly the biliary system) as well as incomplete tumor ablation.  Following this prolonged and detailed conversation, the patient and the patient's wife wishes to pursue this procedure.  As such, the microwave ablation will be performed at [facility] at the next available anesthesia ablation slot. This procedure will entail an overnight admission for continued observation and PCA usage.  In the meantime, I will obtain a cirrhosis protocol abdominal CT for procedural planning purposes.  Thank you for this interesting consult.  I greatly enjoyed meeting Peter Nelson and look forward to participating in his care.  A copy of this report was sent to the requesting provider on this date.  Electronically Signed: Sandi Mariscal 12/03/2015, 2:44 PM   I spent a total of 30 Minutes in face to face in clinical consultation, greater than 50% of which was counseling/coordinating care  for hepatocellular carcinoma

## 2015-12-03 NOTE — Progress Notes (Signed)
HPI: Peter Nelson is a 65 y.o. male who is here for his 2 wks f/u with hep C.   Lab Results  Component Value Date   HCVGENOTYPE 1a 08/18/2015    Allergies: Allergies  Allergen Reactions  . Aspirin Other (See Comments)    Does not take because of hep c    Vitals: Temp: 98.4 F (36.9 C) (02/15 1413) Temp Source: Oral (02/15 1413) BP: 157/80 mmHg (02/15 1413) Pulse Rate: 77 (02/15 1413)  Past Medical History: Past Medical History  Diagnosis Date  . Hepatitis C   . Alcohol abuse   . Arthritis   . Asthma   . Cancer ([place])   . Shortness of breath dyspnea     with activity and anxiety    Social History: Social History   Social History  . Marital Status: Married    Spouse Name: [name]  . Number of Children: 53  . Years of Education: [name]   Social History Main Topics  . Smoking status: Current Every Day Smoker -- 0.50 packs/day for 45 years    Types: Cigarettes  . Smokeless tobacco: Never Used     Comment: cutting back  . Alcohol Use: 0.0 oz/week    0 Standard drinks or equivalent per week     Comment: "a 1/2 pint a day" for more than 30 years, average 1 pint a day, cut back lately   . Drug Use: No  . Sexual Activity: Not on file   Other Topics Concern  . Not on file   Social History Narrative   Married Dec 22nd, 2016 to [place]   Has #9 children    Labs: HEPATITIS B SURFACE AG (no units)  Date Value  08/18/2015 NEGATIVE    Lab Results  Component Value Date   HCVGENOTYPE 1a 08/18/2015    Hepatitis C RNA quantitative Latest Ref Rng 08/18/2015  HCV Quantitative <15 IU/mL [id number](W)  HCV Quantitative Log <1.18 log 10 6.20(H)    AST (U/L)  Date Value  12/02/2015 104*  11/13/2015 192*  08/21/2015 197*  07/28/2015 227*  06/11/2015 280*   ALT (U/L)  Date Value  12/02/2015 70*  11/13/2015 87*  08/21/2015 91*  07/28/2015 113*  06/11/2015 151*   INR (no units)  Date Value  11/26/2015 1.27  11/13/2015 1.10*  08/21/2015 1.18  06/11/2015 1.25     CrCl: Estimated Creatinine Clearance: 68.6 mL/min (by C-G formula based on Cr of 1).  Fibrosis Score: F3/4 as assessed by ARFI  Child-Pugh Score: Class B  Previous Treatment Regimen: Naive  Assessment: Peter Nelson is here to his 2 wks f/u of the hep C. He is undergoing quite a few things now including the dx of hepatocellular carcinoma. Onc is going to do microwave ablation. He was approved for [employer]. Therefore, we are planning to treat him with Epclusa + ribavirin. There was a miscommunication with his ribavirin though when he picked it up from the pharmacy. The family misunderstood and told him to take a total of 622m/day instead of 10089m He has been on that dose for about 2 wks now. Clarified it with him and his wife today to take 3 tabs wth breakfast and 2 with dinner. He has not missed any doses. We are going to check a CBC today since he is on riba. Schedule him to come back in 2 wks for CBC and VL at that time. He has an appt with Dr. CoJerl Santosn early March.   Recommendations:  Cont Epclusa 1  PO qday Start correct dose of ribavirin 623m AM/4055mPM VL/CBC in 2 wks CBC today  PhOnnie Boeru[place]hamPh[place]., BCPS, AAHIVP Clinical Infectious [facility] 12/03/2015, 3:10 PM

## 2015-12-03 NOTE — Patient Instructions (Signed)
Take Ribavirin 3 tabs with breakfast and 2 with dinner Cont to take Epclusa 1 tab daily Come back in 2 wk for labs Dr Linus Salmons in March

## 2015-12-04 ENCOUNTER — Ambulatory Visit: Payer: Medicaid Other

## 2015-12-04 LAB — CBC
HCT: 40 % (ref 39.0–52.0)
Hemoglobin: 13.3 g/dL (ref 13.0–17.0)
MCH: 34.8 pg — AB (ref 26.0–34.0)
MCHC: 33.3 g/dL (ref 30.0–36.0)
MCV: 104.7 fL — ABNORMAL HIGH (ref 78.0–100.0)
MPV: 10.7 fL (ref 8.6–12.4)
RBC: 3.82 MIL/uL — AB (ref 4.22–5.81)
RDW: 14.1 % (ref 11.5–15.5)
WBC: 5.5 10*3/uL (ref 4.0–10.5)

## 2015-12-04 NOTE — Telephone Encounter (Signed)
Script sent to pharmacy.

## 2015-12-08 ENCOUNTER — Other Ambulatory Visit (HOSPITAL_COMMUNITY): Payer: Self-pay | Admitting: Interventional Radiology

## 2015-12-08 ENCOUNTER — Ambulatory Visit (HOSPITAL_COMMUNITY)
Admission: RE | Admit: 2015-12-08 | Discharge: 2015-12-08 | Disposition: A | Discharge status: Home / Self Care | Payer: No Typology Code available for payment source | Source: Ambulatory Visit | Attending: Interventional Radiology | Admitting: Interventional Radiology

## 2015-12-08 DIAGNOSIS — I700 Atherosclerosis of aorta: Secondary | ICD-10-CM | POA: Insufficient documentation

## 2015-12-08 DIAGNOSIS — K7460 Unspecified cirrhosis of liver: Secondary | ICD-10-CM | POA: Insufficient documentation

## 2015-12-08 DIAGNOSIS — C220 Liver cell carcinoma: Secondary | ICD-10-CM | POA: Insufficient documentation

## 2015-12-08 DIAGNOSIS — N200 Calculus of kidney: Secondary | ICD-10-CM | POA: Insufficient documentation

## 2015-12-08 MED ORDER — IOHEXOL 300 MG/ML  SOLN
100.0000 mL | Freq: Once | INTRAMUSCULAR | Status: AC | PRN
  Administered 2015-12-08: 100 mL via INTRAVENOUS

## 2015-12-10 MED FILL — RIBAVIRIN 200 MG TABLET: 200 | 28 days supply | Qty: 140 | Fill #1

## 2015-12-15 ENCOUNTER — Encounter: Payer: Self-pay | Admitting: Internal Medicine

## 2015-12-15 ENCOUNTER — Ambulatory Visit (AMBULATORY_SURGERY_CENTER): Payer: Self-pay | Admitting: Internal Medicine

## 2015-12-15 VITALS — BP 138/83 | HR 88 | Temp 98.3°F | Resp 18 | Ht 65.5 in | Wt 143.0 lb

## 2015-12-15 DIAGNOSIS — K635 Polyp of colon: Secondary | ICD-10-CM

## 2015-12-15 DIAGNOSIS — D125 Benign neoplasm of sigmoid colon: Secondary | ICD-10-CM

## 2015-12-15 DIAGNOSIS — K7030 Alcoholic cirrhosis of liver without ascites: Secondary | ICD-10-CM

## 2015-12-15 DIAGNOSIS — Z1211 Encounter for screening for malignant neoplasm of colon: Secondary | ICD-10-CM

## 2015-12-15 MED ORDER — SODIUM CHLORIDE 0.9 % IV SOLN: 500.0000 mL | INTRAVENOUS | Status: DC

## 2015-12-15 NOTE — Op Note (Signed)
[name]  [address]. [place], [id number]   ENDOSCOPY PROCEDURE REPORT  PATIENT: Peter, Nelson  MR#: [id number] BIRTHDATE: Aug 19, 1951 , 64  yrs. old GENDER: male ENDOSCOPIST: Jerene Bears, MD REFERRED BY:   Dr. Linus Salmons, Dr. Burr Medico PROCEDURE DATE:  12/15/2015 PROCEDURE:  EGD, diagnostic ASA CLASS:     Class III INDICATIONS:  screening for esophageal and gastric varices in patient with cirrhosis and HCC, reported history of possible melena and rectal bleeding. MEDICATIONS: Monitored anesthesia care and Propofol 280 mg IV TOPICAL ANESTHETIC: none  DESCRIPTION OF PROCEDURE: After the risks benefits and alternatives of the procedure were thoroughly explained, informed consent was obtained.  The LB [id number] endoscope was introduced through the mouth and advanced to the second portion of the duodenum , Without limitations.  The instrument was slowly withdrawn as the mucosa was fully examined.   ESOPHAGUS: A nonobstructive Schatzki's ring was found at GE junction.   Normal esophageal mucosa. No esophageal varices.  STOMACH: A small hiatal hernia was noted.   The mucosa of the stomach appeared normal.   No gastric varices seen.  DUODENUM: The duodenal mucosa showed no abnormalities in the bulb and 2nd part of the duodenum.  Retroflexed views revealed small hiatal hernia.     The scope was then withdrawn from the patient and the procedure completed.  COMPLICATIONS: There were no immediate complications.  ENDOSCOPIC IMPRESSION: 1.   Nonobstructing Schatzki's ring was found at GEJ.  No esophageal varices seen 2.   Small hiatal hernia 3.   The mucosa of the stomach appeared normal.  No gastric varices seen 4.   The duodenal mucosa showed no abnormalities in the bulb and 2nd part of the duodenum  RECOMMENDATIONS: 1.  Proceed with a Colonoscopy. 2.  Repeat EGD in 3 years for variceal screening  eSigned:  Jerene Bears, MD 12/15/2015 2:41 PM  CC:  the patient, Dr. Linus Salmons, Dr. Burr Medico

## 2015-12-15 NOTE — Progress Notes (Signed)
Called to room to assist during endoscopic procedure.  Patient ID and intended procedure confirmed with present staff. Received instructions for my participation in the procedure from the performing physician.  

## 2015-12-15 NOTE — Progress Notes (Signed)
To recovery, report to Annetta South, Therapist, sports, VSS

## 2015-12-15 NOTE — Patient Instructions (Signed)
Impressions/recommendations:  Endoscopy:  Hiatal hernia (handout given) Repeat EGD in 3 years for variceal screening.  Colonoscopy:  Polyp (handout given) Hemorrhoids (handout given)  Repeat colonoscopy pending pathology results.  YOU HAD AN ENDOSCOPIC PROCEDURE TODAY AT [facility]:   Refer to the procedure report that was given to you for any specific questions about what was found during the examination.  If the procedure report does not answer your questions, please call your gastroenterologist to clarify.  If you requested that your care partner not be given the details of your procedure findings, then the procedure report has been included in a sealed envelope for you to review at your convenience later.  YOU SHOULD EXPECT: Some feelings of bloating in the abdomen. Passage of more gas than usual.  Walking can help get rid of the air that was put into your GI tract during the procedure and reduce the bloating. If you had a lower endoscopy (such as a colonoscopy or flexible sigmoidoscopy) you may notice spotting of blood in your stool or on the toilet paper. If you underwent a bowel prep for your procedure, you may not have a normal bowel movement for a few days.  Please Note:  You might notice some irritation and congestion in your nose or some drainage.  This is from the oxygen used during your procedure.  There is no need for concern and it should clear up in a day or so.  SYMPTOMS TO REPORT IMMEDIATELY:   Following lower endoscopy (colonoscopy or flexible sigmoidoscopy):  Excessive amounts of blood in the stool  Significant tenderness or worsening of abdominal pains  Swelling of the abdomen that is new, acute  Fever of 100F or higher   Following upper endoscopy (EGD)  Vomiting of blood or coffee ground material  New chest pain or pain under the shoulder blades  Painful or persistently difficult swallowing  New shortness of breath  Fever of 100F or  higher  Black, tarry-looking stools  For urgent or emergent issues, a gastroenterologist can be reached at any hour by calling [phone].   DIET: Your first meal following the procedure should be a small meal and then it is ok to progress to your normal diet. Heavy or fried foods are harder to digest and may make you feel nauseous or bloated.  Likewise, meals heavy in dairy and vegetables can increase bloating.  Drink plenty of fluids but you should avoid alcoholic beverages for 24 hours.  ACTIVITY:  You should plan to take it easy for the rest of today and you should NOT DRIVE or use heavy machinery until tomorrow (because of the sedation medicines used during the test).    FOLLOW UP: Our staff will call the number listed on your records the next business day following your procedure to check on you and address any questions or concerns that you may have regarding the information given to you following your procedure. If we do not reach you, we will leave a message.  However, if you are feeling well and you are not experiencing any problems, there is no need to return our call.  We will assume that you have returned to your regular daily activities without incident.  If any biopsies were taken you will be contacted by phone or by letter within the next 1-3 weeks.  Please call us at [phone] if you have not heard about the biopsies in 3 weeks.    SIGNATURES/CONFIDENTIALITY: You and/or your care partner  have signed paperwork which will be entered into your electronic medical record.  These signatures attest to the fact that that the information above on your After Visit Summary has been reviewed and is understood.  Full responsibility of the confidentiality of this discharge information lies with you and/or your care-partner.

## 2015-12-15 NOTE — Op Note (Signed)
[place]  [address]. [place], [address]   COLONOSCOPY PROCEDURE REPORT  PATIENT: Peter Nelson, Peter Nelson  MR#: [id number] BIRTHDATE: Mar 28, 1951 , 64  yrs. old GENDER: male ENDOSCOPIST: Jerene Bears, MD REFERRED BY: Dr. Linus Salmons, Dr. Burr Medico PROCEDURE DATE:  12/15/2015 PROCEDURE:   Colonoscopy, screening and Colonoscopy with cold biopsy polypectomy First Screening Colonoscopy - Avg.  risk and is 50 yrs.  old or older Yes.  Prior Negative Screening - Now for repeat screening. [name]  History of Adenoma - Now for follow-up colonoscopy & has been > or = to 3 yrs.  [name]  Polyps removed today? Yes ASA CLASS:   Class III INDICATIONS:Screening for colonic neoplasia, Colorectal Neoplasm Risk Assessment for this procedure is average risk, and first colonoscopy; history of rectal bleeding. MEDICATIONS: Monitored anesthesia care, Propofol 500 mg IV, and this was the total dose used for all procedures at this session  DESCRIPTION OF PROCEDURE:   After the risks benefits and alternatives of the procedure were thoroughly explained, informed consent was obtained.  The digital rectal exam revealed no rectal mass.   The LB [id number]  endoscope was introduced through the anus and advanced to the cecum, which was identified by both the appendix and ileocecal valve. No adverse events experienced. The quality of the prep was good.  (Suprep was used)  The instrument was then slowly withdrawn as the colon was fully examined. Estimated blood loss is zero unless otherwise noted in this procedure report.  COLON FINDINGS: A sessile polyp measuring 3 mm in size was found in the sigmoid colon.  A polypectomy was performed with cold forceps. The resection was complete, the polyp tissue was completely retrieved and sent to histology.   The examination was otherwise normal.  Retroflexed views revealed moderate-sized internal hemorrhoids. The time to cecum = 3.0 Withdrawal time = 9.7   The scope  was withdrawn and the procedure completed. COMPLICATIONS: There were no immediate complications.  ENDOSCOPIC IMPRESSION: 1.   Sessile polyp was found in the sigmoid colon; polypectomy was performed with cold forceps 2.   The examination was otherwise normal 3.   Internal hemorrhoids  RECOMMENDATIONS: 1.  Await pathology results 2.  If the polyp removed today is proven to be an adenomatous (pre-cancerous) polyp, you will need a repeat colonoscopy in 5 years.  Otherwise you should continue to follow colorectal cancer screening guidelines for "routine risk" patients with colonoscopy in 10 years.  You will receive a letter within 1-2 weeks with the results of your biopsy as well as final recommendations.  Please call my office if you have not received a letter after 3 weeks. 3.  If rectal bleeding continues would consider office visits for hemorrhoidal banding  eSigned:  Jerene Bears, MD 12/15/2015 2:46 PM  cc:  the patient, Dr. Linus Salmons, Dr. Burr Medico

## 2015-12-16 ENCOUNTER — Other Ambulatory Visit: Payer: Self-pay | Admitting: Internal Medicine

## 2015-12-16 ENCOUNTER — Telehealth: Payer: Self-pay

## 2015-12-16 ENCOUNTER — Other Ambulatory Visit: Payer: No Typology Code available for payment source

## 2015-12-16 ENCOUNTER — Other Ambulatory Visit: Payer: Self-pay | Admitting: *Deleted

## 2015-12-16 DIAGNOSIS — K7400 Hepatic fibrosis, unspecified: Secondary | ICD-10-CM

## 2015-12-16 DIAGNOSIS — B182 Chronic viral hepatitis C: Secondary | ICD-10-CM

## 2015-12-16 LAB — CBC
HEMATOCRIT: 40.4 % (ref 39.0–52.0)
Hemoglobin: 13.5 g/dL (ref 13.0–17.0)
MCH: 34.4 pg — ABNORMAL HIGH (ref 26.0–34.0)
MCHC: 33.4 g/dL (ref 30.0–36.0)
MCV: 102.8 fL — ABNORMAL HIGH (ref 78.0–100.0)
MPV: 9.7 fL (ref 8.6–12.4)
Platelets: 133 10*3/uL — ABNORMAL LOW (ref 150–400)
RBC: 3.93 MIL/uL — ABNORMAL LOW (ref 4.22–5.81)
RDW: 13.5 % (ref 11.5–15.5)
WBC: 6.9 10*3/uL (ref 4.0–10.5)

## 2015-12-16 NOTE — Telephone Encounter (Signed)
  Follow up Call-  Call back number 12/15/2015  Post procedure Call Back phone  # [phone]  Permission to leave phone message Yes     Patient questions:  Do you have a fever, pain , or abdominal swelling? No. Pain Score  0 *  Have you tolerated food without any problems? Yes.    Have you been able to return to your normal activities? Yes.    Do you have any questions about your discharge instructions: Diet   No. Medications  No. Follow up visit  No.  Do you have questions or concerns about your Care? No.  Actions: * If pain score is 4 or above: No action needed, pain <4.

## 2015-12-17 LAB — HEPATITIS C RNA QUANTITATIVE: HCV Quantitative: <15 IU/mL (ref <15)

## 2015-12-19 NOTE — Progress Notes (Signed)
Please put in orders for pt's pre-op visit scheduled Tuesday, March 7. Thank you!

## 2015-12-20 ENCOUNTER — Other Ambulatory Visit: Payer: Self-pay | Admitting: Radiology

## 2015-12-22 ENCOUNTER — Encounter: Payer: Self-pay | Admitting: Internal Medicine

## 2015-12-23 ENCOUNTER — Encounter: Payer: Self-pay | Admitting: Internal Medicine

## 2015-12-23 ENCOUNTER — Other Ambulatory Visit: Payer: Self-pay | Admitting: General Surgery

## 2015-12-23 ENCOUNTER — Ambulatory Visit (INDEPENDENT_AMBULATORY_CARE_PROVIDER_SITE_OTHER): Payer: No Typology Code available for payment source | Admitting: Internal Medicine

## 2015-12-23 ENCOUNTER — Other Ambulatory Visit: Payer: Self-pay | Admitting: Radiology

## 2015-12-23 ENCOUNTER — Encounter (HOSPITAL_COMMUNITY)
Admission: RE | Admit: 2015-12-23 | Discharge: 2015-12-23 | Disposition: A | Discharge status: Home / Self Care | Payer: No Typology Code available for payment source | Source: Ambulatory Visit | Attending: Interventional Radiology | Admitting: Interventional Radiology

## 2015-12-23 ENCOUNTER — Encounter (HOSPITAL_COMMUNITY): Payer: Self-pay

## 2015-12-23 VITALS — BP 156/82 | HR 86 | Temp 98.2°F | Wt 142.0 lb

## 2015-12-23 DIAGNOSIS — Z01818 Encounter for other preprocedural examination: Secondary | ICD-10-CM | POA: Insufficient documentation

## 2015-12-23 DIAGNOSIS — B182 Chronic viral hepatitis C: Secondary | ICD-10-CM

## 2015-12-23 DIAGNOSIS — F1010 Alcohol abuse, uncomplicated: Secondary | ICD-10-CM

## 2015-12-23 DIAGNOSIS — Z0183 Encounter for blood typing: Secondary | ICD-10-CM | POA: Insufficient documentation

## 2015-12-23 DIAGNOSIS — K7460 Unspecified cirrhosis of liver: Secondary | ICD-10-CM

## 2015-12-23 DIAGNOSIS — Z01812 Encounter for preprocedural laboratory examination: Secondary | ICD-10-CM | POA: Insufficient documentation

## 2015-12-23 DIAGNOSIS — C220 Liver cell carcinoma: Secondary | ICD-10-CM | POA: Insufficient documentation

## 2015-12-23 HISTORY — DX: Presence of spectacles and contact lenses: Z97.3

## 2015-12-23 LAB — CBC WITH DIFFERENTIAL/PLATELET
BASOS ABS: 0 10*3/uL (ref 0.0–0.1)
Basophils Absolute: 0 10*3/uL (ref 0.0–0.1)
Basophils Relative: 1 % (ref 0–1)
Basophils Relative: 1 %
EOS ABS: 0.2 10*3/uL (ref 0.0–0.7)
EOS ABS: 0.1 10*3/uL (ref 0.0–0.7)
EOS PCT: 5 % (ref 0–5)
EOS PCT: 2 %
HCT: 38.4 % — ABNORMAL LOW (ref 39.0–52.0)
HEMATOCRIT: 37.1 % — AB (ref 39.0–52.0)
Hemoglobin: 12.7 g/dL — ABNORMAL LOW (ref 13.0–17.0)
Hemoglobin: 12.8 g/dL — ABNORMAL LOW (ref 13.0–17.0)
LYMPHS ABS: 1.3 10*3/uL (ref 0.7–4.0)
LYMPHS ABS: 1.9 10*3/uL (ref 0.7–4.0)
LYMPHS PCT: 37 % (ref 12–46)
Lymphocytes Relative: 46 %
MCH: 34.8 pg — AB (ref 26.0–34.0)
MCH: 34.9 pg — AB (ref 26.0–34.0)
MCHC: 34.2 g/dL (ref 30.0–36.0)
MCHC: 33.3 g/dL (ref 30.0–36.0)
MCV: 101.6 fL — AB (ref 78.0–100.0)
MCV: 104.6 fL — ABNORMAL HIGH (ref 78.0–100.0)
MONO ABS: 0.5 10*3/uL (ref 0.1–1.0)
MONOS PCT: 14 % — AB (ref 3–12)
MPV: 9.6 fL (ref 8.6–12.4)
Monocytes Absolute: 0.6 10*3/uL (ref 0.1–1.0)
Monocytes Relative: 15 %
Neutro Abs: 1.5 10*3/uL — ABNORMAL LOW (ref 1.7–7.7)
Neutro Abs: 1.5 10*3/uL — ABNORMAL LOW (ref 1.7–7.7)
Neutrophils Relative %: 43 % (ref 43–77)
Neutrophils Relative %: 36 %
PLATELETS: 111 10*3/uL — AB (ref 150–400)
PLATELETS: 110 10*3/uL — AB (ref 150–400)
RBC: 3.65 MIL/uL — ABNORMAL LOW (ref 4.22–5.81)
RBC: 3.67 MIL/uL — AB (ref 4.22–5.81)
RDW: 13.4 % (ref 11.5–15.5)
RDW: 13 % (ref 11.5–15.5)
WBC: 3.6 10*3/uL — ABNORMAL LOW (ref 4.0–10.5)
WBC: 4.2 10*3/uL (ref 4.0–10.5)

## 2015-12-23 LAB — COMPREHENSIVE METABOLIC PANEL
ALT: 44 U/L (ref 17–63)
AST: 87 U/L — ABNORMAL HIGH (ref 15–41)
Albumin: 3 g/dL — ABNORMAL LOW (ref 3.5–5.0)
Alkaline Phosphatase: 96 U/L (ref 38–126)
Anion gap: 8 (ref 5–15)
BUN: 10 mg/dL (ref 6–20)
CHLORIDE: 105 mmol/L (ref 101–111)
CO2: 26 mmol/L (ref 22–32)
CREATININE: 1.08 mg/dL (ref 0.61–1.24)
Calcium: 8.7 mg/dL — ABNORMAL LOW (ref 8.9–10.3)
GFR calc non Af Amer: >60 mL/min (ref >60)
Glucose, Bld: 87 mg/dL (ref 65–99)
POTASSIUM: 3.7 mmol/L (ref 3.5–5.1)
SODIUM: 139 mmol/L (ref 135–145)
Total Bilirubin: 3 mg/dL — ABNORMAL HIGH (ref 0.3–1.2)
Total Protein: 7.9 g/dL (ref 6.5–8.1)

## 2015-12-23 LAB — PROTIME-INR
INR: 1.35 (ref 0.00–1.49)
Prothrombin Time: 16.3 seconds — ABNORMAL HIGH (ref 11.6–15.2)

## 2015-12-23 LAB — APTT: APTT: 36 s (ref 24–37)

## 2015-12-23 LAB — ABO/RH: ABO/RH(D): O NEG

## 2015-12-23 NOTE — Progress Notes (Signed)
Dr Kalman Shan / anesthesia contacted in regards to anesthesia consult. MRN number provided. Orders noted. Anesthesia to see pt day of surgery.

## 2015-12-23 NOTE — Assessment & Plan Note (Signed)
Appreciate Dr. Ernestina Penna help with management.  Ablation tomorrow and biopsy noted.

## 2015-12-23 NOTE — Assessment & Plan Note (Signed)
Needs to stop and knows this.  He has information to get help that he needs.

## 2015-12-23 NOTE — Progress Notes (Addendum)
Dr Arletha Pili for pt to take Ribavirin and Epclusa day of surgery CXR epic 08/21/2015

## 2015-12-23 NOTE — Progress Notes (Addendum)
CBCD and PT results in epic per PAT visit 12/23/2015 sent to Dr Pascal Lux

## 2015-12-23 NOTE — Patient Instructions (Addendum)
Peter Nelson  12/23/2015   Your procedure is scheduled on: Wednesday December 24, 2015  Report to [facility] Main  Entrance then to radiology department at 6:30 AM.   Call this number if you have problems the morning of surgery [phone]   Remember: ONLY 1 PERSON MAY GO WITH YOU TO  GET  READY MORNING OF [place].  Do not eat food or drink liquids :After Midnight.     Take these medicines the morning of surgery with A SIP OF WATER: Ribavirin; Sofosbuvir-Velpatasvir              You may not have any metal on your body including hair pins and              piercings  Do not wear jewelry, lotions, powders or colognes, deodorant                        Men may shave face and neck.   Do not bring valuables to the hospital. [place].  Contacts, dentures or bridgework may not be worn into surgery.  Leave suitcase in the car. After surgery it may be brought to your room.   NO TOBACCO OR ALCOHOL USE DAY OF SURGERY   _____________________________________________________________________             [facility] - Preparing for Surgery Before surgery, you can play an important role.  Because skin is not sterile, your skin needs to be as free of germs as possible.  You can reduce the number of germs on your skin by washing with CHG (chlorahexidine gluconate) soap before surgery.  CHG is an antiseptic cleaner which kills germs and bonds with the skin to continue killing germs even after washing. Please DO NOT use if you have an allergy to CHG or antibacterial soaps.  If your skin becomes reddened/irritated stop using the CHG and inform your nurse when you arrive at Short Stay. Do not shave (including legs and underarms) for at least 48 hours prior to the first CHG shower.  You may shave your face/neck. Please follow these instructions carefully:  1.  Shower with CHG Soap the night before surgery and the  morning of  Surgery.  2.  If you choose to wash your hair, wash your hair first as usual with your  normal  shampoo.  3.  After you shampoo, rinse your hair and body thoroughly to remove the  shampoo.                           4.  Use CHG as you would any other liquid soap.  You can apply chg directly  to the skin and wash                       Gently with a scrungie or clean washcloth.  5.  Apply the CHG Soap to your body ONLY FROM THE NECK DOWN.   Do not use on face/ open                           Wound or open sores. Avoid contact with eyes, ears mouth and genitals (private parts).  Wash face,  Genitals (private parts) with your normal soap.             6.  Wash thoroughly, paying special attention to the area where your surgery  will be performed.  7.  Thoroughly rinse your body with warm water from the neck down.  8.  DO NOT shower/wash with your normal soap after using and rinsing off  the CHG Soap.                9.  Pat yourself dry with a clean towel.            10.  Wear clean pajamas.            11.  Place clean sheets on your bed the night of your first shower and do not  sleep with pets. Day of Surgery : Do not apply any lotions/deodorants the morning of surgery.  Please wear clean clothes to the hospital/surgery center.  FAILURE TO FOLLOW THESE INSTRUCTIONS MAY RESULT IN THE CANCELLATION OF YOUR SURGERY PATIENT SIGNATURE_________________________________  NURSE SIGNATURE__________________________________  ________________________________________________________________________

## 2015-12-23 NOTE — Assessment & Plan Note (Signed)
Appreciate Dr. [name] seeing him and doing EGD and colonoscopy.  Polyp.

## 2015-12-23 NOTE — Anesthesia Preprocedure Evaluation (Addendum)
Anesthesia Evaluation  Patient identified by MRN, date of birth, ID band Patient awake    Reviewed: Allergy & Precautions, H&P , NPO status , Patient's Chart, lab work & pertinent test results  Airway Mallampati: II  TM Distance: >3 FB Neck ROM: full    Dental  (+) Dental Advisory Given, Missing All front teeth missing except one lower:   Pulmonary shortness of breath and with exertion, asthma , Current Smoker,    Pulmonary exam normal breath sounds clear to auscultation       Cardiovascular Exercise Tolerance: Good negative cardio ROS Normal cardiovascular exam Rhythm:regular Rate:Normal  syncope   Neuro/Psych Memory loss negative neurological ROS  negative psych ROS   GI/Hepatic negative GI ROS, (+) Cirrhosis     substance abuse  alcohol use, Hepatitis -, CHepatocellular carcinoma   Endo/Other  negative endocrine ROS  Renal/GU negative Renal ROS  negative genitourinary   Musculoskeletal   Abdominal   Peds  Hematology negative hematology ROS (+) plt 106K   Anesthesia Other Findings   Reproductive/Obstetrics negative OB ROS                            Anesthesia Physical Anesthesia Plan  ASA: III  Anesthesia Plan: General   Post-op Pain Management:    Induction: Intravenous  Airway Management Planned: Oral ETT  Additional Equipment:   Intra-op Plan:   Post-operative Plan: Extubation in OR  Informed Consent: I have reviewed the patients History and Physical, chart, labs and discussed the procedure including the risks, benefits and alternatives for the proposed anesthesia with the patient or authorized representative who has indicated his/her understanding and acceptance.   Dental Advisory Given  Plan Discussed with: CRNA and Surgeon  Anesthesia Plan Comments:         Anesthesia Quick Evaluation

## 2015-12-23 NOTE — Progress Notes (Signed)
   Subjective:    Patient ID: Peter Nelson, male    DOB: 05-30-1951, 65 y.o.   MRN: [id number]  HPI Here for follow up of HCV.   He is genotype 1a, elastography F3/4, Child Pugh B and ultrasound with concern for liver mass.  He is seeing our counselor for alcohol abuse and still drinking some but much better.  Does get DTs without alcohol so will need detox to completely stop.  Since I last saw him he started Epclusa with ribavirin for 12 weeks.  No anemia noted on labs.  Tolerating medication well and no headache, no fatigue.   Had liver biopsy and does not show adenoma.  Getting ablation tomorrow by IR. Referred to [facility] but since he is uninsured, he will not be able to be seen.   Early viral load is undetectable.    Review of Systems  Constitutional: Negative for fatigue.  Gastrointestinal: Negative for nausea and diarrhea.  Skin: Negative for rash.  Neurological: Negative for dizziness and light-headedness.       Objective:   Physical Exam  Constitutional: He appears well-developed and well-nourished. No distress.  Eyes: No scleral icterus.  Cardiovascular: Normal rate, regular rhythm and normal heart sounds.   No murmur heard. Pulmonary/Chest: Effort normal and breath sounds normal. No respiratory distress.  Skin: No rash noted.    Social History   Social History  . Marital Status: Married    Spouse Name: [name]  . Number of Children: 39  . Years of Education: [name]   Occupational History  . Not on file.   Social History Main Topics  . Smoking status: Current Every Day Smoker -- 0.50 packs/day for 45 years    Types: Cigarettes  . Smokeless tobacco: Never Used     Comment: cutting back  . Alcohol Use: 0.0 oz/week    0 Standard drinks or equivalent per week     Comment: "a 1/2 pint a day" for more than 30 years, average 1 pint a day, cut back lately   . Drug Use: No  . Sexual Activity: Not on file   Other Topics Concern  . Not on file   Social History  Narrative   Married Dec 22nd, 2016 to [place]   Has #9 children   Continues to drink but less      Assessment & Plan:

## 2015-12-23 NOTE — Assessment & Plan Note (Signed)
Undetectable so far.  Will recheck Hgb on weight based riba.  If ok, will continue, othrewise dose reduce to 600 mg daily.  rtc after treatment completion.

## 2015-12-24 ENCOUNTER — Inpatient Hospital Stay (HOSPITAL_COMMUNITY)
Admission: RE | Admit: 2015-12-24 | Discharge: 2015-12-26 | DRG: 406 | Disposition: A | Discharge status: Home / Self Care | Payer: Self-pay | Source: Ambulatory Visit | Attending: Interventional Radiology | Admitting: Interventional Radiology

## 2015-12-24 ENCOUNTER — Ambulatory Visit (HOSPITAL_COMMUNITY): Payer: Self-pay | Admitting: Anesthesiology

## 2015-12-24 ENCOUNTER — Encounter (HOSPITAL_COMMUNITY)
Admission: RE | Disposition: A | Discharge status: Home / Self Care | Payer: Self-pay | Source: Ambulatory Visit | Attending: Interventional Radiology

## 2015-12-24 ENCOUNTER — Encounter (HOSPITAL_COMMUNITY): Payer: Self-pay

## 2015-12-24 ENCOUNTER — Ambulatory Visit (HOSPITAL_COMMUNITY)
Admission: RE | Admit: 2015-12-24 | Discharge: 2015-12-24 | Disposition: A | Discharge status: Home / Self Care | Payer: Self-pay | Source: Ambulatory Visit | Attending: Interventional Radiology | Admitting: Interventional Radiology

## 2015-12-24 ENCOUNTER — Encounter (HOSPITAL_COMMUNITY): Payer: Self-pay | Admitting: Anesthesiology

## 2015-12-24 DIAGNOSIS — R042 Hemoptysis: Secondary | ICD-10-CM | POA: Diagnosis present

## 2015-12-24 DIAGNOSIS — Z79899 Other long term (current) drug therapy: Secondary | ICD-10-CM

## 2015-12-24 DIAGNOSIS — B1920 Unspecified viral hepatitis C without hepatic coma: Secondary | ICD-10-CM | POA: Diagnosis present

## 2015-12-24 DIAGNOSIS — J939 Pneumothorax, unspecified: Secondary | ICD-10-CM | POA: Diagnosis not present

## 2015-12-24 DIAGNOSIS — F1010 Alcohol abuse, uncomplicated: Secondary | ICD-10-CM | POA: Diagnosis present

## 2015-12-24 DIAGNOSIS — B182 Chronic viral hepatitis C: Secondary | ICD-10-CM

## 2015-12-24 DIAGNOSIS — R160 Hepatomegaly, not elsewhere classified: Secondary | ICD-10-CM | POA: Diagnosis present

## 2015-12-24 DIAGNOSIS — E860 Dehydration: Secondary | ICD-10-CM | POA: Diagnosis not present

## 2015-12-24 DIAGNOSIS — D72829 Elevated white blood cell count, unspecified: Secondary | ICD-10-CM | POA: Diagnosis present

## 2015-12-24 DIAGNOSIS — J45909 Unspecified asthma, uncomplicated: Secondary | ICD-10-CM | POA: Diagnosis present

## 2015-12-24 DIAGNOSIS — C220 Liver cell carcinoma: Principal | ICD-10-CM | POA: Diagnosis present

## 2015-12-24 DIAGNOSIS — Z01812 Encounter for preprocedural laboratory examination: Secondary | ICD-10-CM

## 2015-12-24 DIAGNOSIS — J9811 Atelectasis: Secondary | ICD-10-CM | POA: Diagnosis not present

## 2015-12-24 DIAGNOSIS — R2681 Unsteadiness on feet: Secondary | ICD-10-CM | POA: Diagnosis present

## 2015-12-24 DIAGNOSIS — Z01810 Encounter for preprocedural cardiovascular examination: Secondary | ICD-10-CM

## 2015-12-24 DIAGNOSIS — Z886 Allergy status to analgesic agent status: Secondary | ICD-10-CM

## 2015-12-24 LAB — RAPID URINE DRUG SCREEN, HOSP PERFORMED
Amphetamines: NOT DETECTED
BENZODIAZEPINES: NOT DETECTED
Barbiturates: NOT DETECTED
COCAINE: NOT DETECTED
OPIATES: NOT DETECTED
Tetrahydrocannabinol: NOT DETECTED

## 2015-12-24 LAB — TYPE AND SCREEN
ABO/RH(D): O NEG
ANTIBODY SCREEN: NEGATIVE

## 2015-12-24 SURGERY — RADIO FREQUENCY ABLATION
Anesthesia: General | Site: Liver

## 2015-12-24 MED ORDER — RIBAVIRIN 200 MG PO CAPS
600.0000 mg | ORAL_CAPSULE | Freq: Every day | ORAL | Status: DC
  Administered 2015-12-26: 600 mg via ORAL
  Filled 2015-12-24 (×3): qty 3

## 2015-12-24 MED ORDER — LACTATED RINGERS IV SOLN
INTRAVENOUS | Status: DC
  Administered 2015-12-24 (×2): via INTRAVENOUS

## 2015-12-24 MED ORDER — HYDROCODONE-ACETAMINOPHEN 5-325 MG PO TABS
1.0000 | ORAL_TABLET | ORAL | Status: DC | PRN
  Filled 2015-12-24: qty 1

## 2015-12-24 MED ORDER — DOCUSATE SODIUM 100 MG PO CAPS
100.0000 mg | ORAL_CAPSULE | Freq: Two times a day (BID) | ORAL | Status: DC
  Administered 2015-12-24 – 2015-12-26 (×3): 100 mg via ORAL
  Filled 2015-12-24 (×4): qty 1

## 2015-12-24 MED ORDER — SOFOSBUVIR-VELPATASVIR 400-100 MG PO TABS: 1.0000 | ORAL_TABLET | Freq: Every day | ORAL | Status: DC

## 2015-12-24 MED ORDER — SODIUM CHLORIDE 0.9% FLUSH: 9.0000 mL | INTRAVENOUS | Status: DC | PRN

## 2015-12-24 MED ORDER — ONDANSETRON HCL 4 MG/2ML IJ SOLN: 4.0000 mg | Freq: Four times a day (QID) | INTRAMUSCULAR | Status: DC | PRN

## 2015-12-24 MED ORDER — HYDROMORPHONE 1 MG/ML IV SOLN
INTRAVENOUS | Status: AC
  Filled 2015-12-24: qty 25

## 2015-12-24 MED ORDER — PIPERACILLIN-TAZOBACTAM 3.375 G IVPB
3.3750 g | Freq: Once | INTRAVENOUS | Status: AC
  Administered 2015-12-24: 3.375 g via INTRAVENOUS
  Filled 2015-12-24: qty 50

## 2015-12-24 MED ORDER — SUGAMMADEX SODIUM 200 MG/2ML IV SOLN
INTRAVENOUS | Status: DC | PRN
  Administered 2015-12-24: 150 mg via INTRAVENOUS

## 2015-12-24 MED ORDER — DIPHENHYDRAMINE HCL 12.5 MG/5ML PO ELIX: 12.5000 mg | ORAL_SOLUTION | Freq: Four times a day (QID) | ORAL | Status: DC | PRN

## 2015-12-24 MED ORDER — LIDOCAINE HCL (CARDIAC) 20 MG/ML IV SOLN
INTRAVENOUS | Status: DC | PRN
  Administered 2015-12-24: 100 mg via INTRAVENOUS

## 2015-12-24 MED ORDER — RIBAVIRIN 200 MG PO CAPS
400.0000 mg | ORAL_CAPSULE | Freq: Every day | ORAL | Status: DC
  Filled 2015-12-24: qty 2

## 2015-12-24 MED ORDER — SUCCINYLCHOLINE CHLORIDE 20 MG/ML IJ SOLN
INTRAMUSCULAR | Status: DC | PRN
  Administered 2015-12-24: 100 mg via INTRAVENOUS

## 2015-12-24 MED ORDER — LACTATED RINGERS IV SOLN: INTRAVENOUS | Status: DC

## 2015-12-24 MED ORDER — NALOXONE HCL 0.4 MG/ML IJ SOLN: 0.4000 mg | INTRAMUSCULAR | Status: DC | PRN

## 2015-12-24 MED ORDER — PROPOFOL 10 MG/ML IV BOLUS
INTRAVENOUS | Status: DC | PRN
  Administered 2015-12-24: 150 mg via INTRAVENOUS

## 2015-12-24 MED ORDER — HYDROMORPHONE HCL 1 MG/ML IJ SOLN: 0.2500 mg | INTRAMUSCULAR | Status: DC | PRN

## 2015-12-24 MED ORDER — PIPERACILLIN-TAZOBACTAM 3.375 G IVPB
3.3750 g | Freq: Three times a day (TID) | INTRAVENOUS | Status: AC
Start: 2015-12-24 — End: 2015-12-25
  Administered 2015-12-24 – 2015-12-25 (×3): 3.375 g via INTRAVENOUS
  Filled 2015-12-24 (×3): qty 50

## 2015-12-24 MED ORDER — HYDROMORPHONE 1 MG/ML IV SOLN
INTRAVENOUS | Status: DC
  Administered 2015-12-24: 4.5 mg via INTRAVENOUS
  Administered 2015-12-24: 0.6 mg via INTRAVENOUS
  Administered 2015-12-25: 3.9 mg via INTRAVENOUS
  Administered 2015-12-25: 2.1 mg via INTRAVENOUS

## 2015-12-24 MED ORDER — VITAMIN B-12 1000 MCG PO TABS
1000.0000 ug | ORAL_TABLET | Freq: Every day | ORAL | Status: DC
  Administered 2015-12-26: 1000 ug via ORAL
  Filled 2015-12-24 (×3): qty 1

## 2015-12-24 MED ORDER — HYDROMORPHONE 1 MG/ML IV SOLN
INTRAVENOUS | Status: DC
  Administered 2015-12-24: 1 mg via INTRAVENOUS

## 2015-12-24 MED ORDER — ROCURONIUM BROMIDE 100 MG/10ML IV SOLN
INTRAVENOUS | Status: DC | PRN
  Administered 2015-12-24: 50 mg via INTRAVENOUS
  Administered 2015-12-24: 10 mg via INTRAVENOUS
  Administered 2015-12-24: 5 mg via INTRAVENOUS
  Administered 2015-12-24: 20 mg via INTRAVENOUS
  Administered 2015-12-24: 5 mg via INTRAVENOUS

## 2015-12-24 MED ORDER — RIBAVIRIN 200 MG PO CAPS: 400.0000 mg | ORAL_CAPSULE | Freq: Two times a day (BID) | ORAL | Status: DC

## 2015-12-24 MED ORDER — DIPHENHYDRAMINE HCL 12.5 MG/5ML PO ELIX
12.5000 mg | ORAL_SOLUTION | Freq: Four times a day (QID) | ORAL | Status: DC | PRN
  Filled 2015-12-24: qty 5

## 2015-12-24 MED ORDER — LABETALOL HCL 5 MG/ML IV SOLN
INTRAVENOUS | Status: DC | PRN
  Administered 2015-12-24: 5 mg via INTRAVENOUS

## 2015-12-24 MED ORDER — MIDAZOLAM HCL 2 MG/2ML IJ SOLN
INTRAMUSCULAR | Status: AC
  Filled 2015-12-24: qty 2

## 2015-12-24 MED ORDER — VITAMIN B-12 1000 MCG PO TABS
1000.0000 ug | ORAL_TABLET | Freq: Every day | ORAL | Status: DC
  Filled 2015-12-24: qty 1

## 2015-12-24 MED ORDER — RIBAVIRIN 200 MG PO CAPS
400.0000 mg | ORAL_CAPSULE | Freq: Every day | ORAL | Status: DC
  Administered 2015-12-24 – 2015-12-25 (×2): 400 mg via ORAL
  Filled 2015-12-24 (×4): qty 2

## 2015-12-24 MED ORDER — DIPHENHYDRAMINE HCL 50 MG/ML IJ SOLN: 12.5000 mg | Freq: Four times a day (QID) | INTRAMUSCULAR | Status: DC | PRN

## 2015-12-24 MED ORDER — DOCUSATE SODIUM 100 MG PO CAPS: 100.0000 mg | ORAL_CAPSULE | Freq: Two times a day (BID) | ORAL | Status: DC

## 2015-12-24 MED ORDER — IOHEXOL 300 MG/ML  SOLN
100.0000 mL | Freq: Once | INTRAMUSCULAR | Status: AC | PRN
  Administered 2015-12-24: 100 mL via INTRAVENOUS

## 2015-12-24 MED ORDER — PIPERACILLIN-TAZOBACTAM 3.375 G IVPB: 3.3750 g | Freq: Three times a day (TID) | INTRAVENOUS | Status: DC

## 2015-12-24 MED ORDER — RIBAVIRIN 200 MG PO CAPS
600.0000 mg | ORAL_CAPSULE | Freq: Every day | ORAL | Status: DC
  Filled 2015-12-24: qty 3

## 2015-12-24 MED ORDER — FENTANYL CITRATE (PF) 100 MCG/2ML IJ SOLN
INTRAMUSCULAR | Status: DC | PRN
  Administered 2015-12-24: 100 ug via INTRAVENOUS
  Administered 2015-12-24: 50 ug via INTRAVENOUS
  Administered 2015-12-24 (×2): 25 ug via INTRAVENOUS
  Administered 2015-12-24: 50 ug via INTRAVENOUS

## 2015-12-24 MED ORDER — FENTANYL CITRATE (PF) 250 MCG/5ML IJ SOLN
INTRAMUSCULAR | Status: AC
  Filled 2015-12-24: qty 10

## 2015-12-24 MED ORDER — HYDROCODONE-ACETAMINOPHEN 5-325 MG PO TABS: 1.0000 | ORAL_TABLET | ORAL | Status: DC | PRN

## 2015-12-24 MED ORDER — MIDAZOLAM HCL 2 MG/2ML IJ SOLN
INTRAMUSCULAR | Status: DC | PRN
  Administered 2015-12-24: 2 mg via INTRAVENOUS

## 2015-12-24 MED ORDER — SOFOSBUVIR-VELPATASVIR 400-100 MG PO TABS
1.0000 | ORAL_TABLET | Freq: Every day | ORAL | Status: DC
  Administered 2015-12-26: 400 mg via ORAL

## 2015-12-24 NOTE — Progress Notes (Signed)
Patient restless trying to urinate, assisted patient to use urinal and commode,unsuccessful Will try later, as per family patient refused foley catheter,

## 2015-12-24 NOTE — Progress Notes (Signed)
Attempted to catheterize pt with both 16Fr and 14Fr urinary catheter.  Unable to pass urethral meatus without significant resistance.  Dr. Pascal Lux notified.  Medium size condom cath applied.

## 2015-12-24 NOTE — Anesthesia Postprocedure Evaluation (Signed)
Anesthesia Post Note  Patient: Peter Nelson  Procedure(s) Performed: Procedure(s) (LRB): MICROWAVE THERMAL ABLATION LIVER ([name])  Patient location during evaluation: PACU Anesthesia Type: General Level of consciousness: awake and alert Pain management: pain level controlled Vital Signs Assessment: post-procedure vital signs reviewed and stable Respiratory status: spontaneous breathing, nonlabored ventilation, respiratory function stable and patient connected to nasal cannula oxygen Cardiovascular status: blood pressure returned to baseline and stable Postop Assessment: no signs of nausea or vomiting Anesthetic complications: no    Last Vitals:  Filed Vitals:   12/24/15 1300 12/24/15 1315  BP: 143/83 144/78  Pulse: 82 81  Temp:    Resp: 13 14    Last Pain:  Filed Vitals:   12/24/15 1317  PainSc: 0-No pain                 Chue Berkovich L

## 2015-12-24 NOTE — Progress Notes (Signed)
Patient ID: Peter Nelson, male   DOB: July 19, 1951, 65 y.o.   MRN: [id number] Pt s/p hepatic MWA today secondary to [facility]; also had rt chest tube placed for ptx. Still  in PACU awaiting bed upstairs. Sl drowsy with some mild RUQ discomfort. VSS;AF; rt chest tube intact, draining small amt blood-tinged fluid; few bubbles noted in pleuravac; mild RUQ abd tenderness. Plan to check CXR in AM and place tube to water seal if no ptx; recheck film 2-3 hours later and hopefully d/c tube/pt if stable; check am labs.

## 2015-12-24 NOTE — Anesthesia Procedure Notes (Signed)
Procedure Name: Intubation Date/Time: 12/24/2015 8:51 AM Performed by: Danley Danker L Patient Re-evaluated:Patient Re-evaluated prior to inductionOxygen Delivery Method: Circle system utilized Preoxygenation: Pre-oxygenation with 100% oxygen Intubation Type: IV induction Ventilation: Mask ventilation without difficulty Laryngoscope Size: Miller and 3 Grade View: Grade I Tube type: Oral Tube size: 8.0 mm Number of attempts: 1 Airway Equipment and Method: Stylet Placement Confirmation: ETT inserted through vocal cords under direct vision,  positive ETCO2 and breath sounds checked- equal and bilateral Secured at: 20 cm Tube secured with: Tape Dental Injury: Teeth and Oropharynx as per pre-operative assessment

## 2015-12-24 NOTE — Transfer of Care (Signed)
Immediate Anesthesia Transfer of Care Note  Patient: Peter Nelson  Procedure(s) Performed: Procedure(s): MICROWAVE THERMAL ABLATION LIVER ([name])  Patient Location: PACU  Anesthesia Type:General  Level of Consciousness: awake, alert  and oriented  Airway & Oxygen Therapy: Patient Spontanous Breathing and Patient connected to face mask oxygen  Post-op Assessment: Report given to RN and Post -op Vital signs reviewed and stable  Post vital signs: Reviewed and stable  Last Vitals:  Filed Vitals:   12/24/15 0652  BP: 155/78  Pulse: 89  Temp: 37 C  Resp: 18    Complications: No apparent anesthesia complications

## 2015-12-24 NOTE — Procedures (Signed)
Technically successful microwave ablation of right liver lesion. Procedure complicated by development of a pneumothorax, requiring chest tube placement for procedural purposed. EBL: Minimal.  Ronny Bacon, MD Pager #: [phone]

## 2015-12-24 NOTE — H&P (Signed)
Referring Physician(s): Feng,Yan  Supervising Physician: Sandi Mariscal  Chief Complaint: Hepatocellular carcinoma   Subjective: Peter Nelson is a 65 y.o. male with past medical history significant for asthma, alcohol abuse and hepatitis C who was found to have an indeterminate 2.8 cm liver lesion in segment 8 of right hepatic lobe on abdominal MRI performed 10/23/2015, confirmed on subsequent outside abdominal MRI performed 11/06/2015 for which the patient underwent a ultrasound-guided liver lesion biopsy on 11/26/2015 with pathology compatible with hepatocellular carcinoma. The patient was recently seen in consultation by Dr. Pascal Lux on 12/03/15 for treatment options and deemed an appropriate candidate for CT-guided thermal/microwave ablation of the [facility]. He presents today for the above procedure. He currently denies fever, HA,CP, worsening dyspnea, cough, abd/back pain,N/V or bleeding.    Allergies: Aspirin  Medications: Prior to Admission medications   Medication Sig Start Date End Date Taking? Authorizing Provider  ribavirin (REBETOL) 200 MG capsule Take 3 caps with breakfast and 2 caps with dinner Patient taking differently: Take 400-600 mg by mouth 2 (two) times daily. Take 3 caps with breakfast and 2 caps with dinner 11/12/15  Yes Thayer Headings, MD  Sofosbuvir-Velpatasvir (EPCLUSA) 400-100 MG TABS Take 1 tablet by mouth daily. 11/12/15  Yes Thayer Headings, MD  vitamin B-12 (CYANOCOBALAMIN) 1000 MCG tablet Take 1,000 mcg by mouth daily.   Yes Historical Provider, MD     Vital Signs: BP 155/78 mmHg  Pulse 89  Temp(Src) 98.6 F (37 C) (Oral)  Resp 18  Ht 5' 6"  (1.676 m)  Wt 143 lb 4 oz (64.978 kg)  BMI 23.13 kg/m2  SpO2 99%  Physical Exam awake/alert; chest- CTA bilat; heart- RRR; abd- soft,+BS,NT; LE- no edema  Imaging: No results found.  Labs:  CBC:  Recent Labs  12/03/15 1545 12/16/15 12/23/15 1029 12/23/15 1330  WBC 5.5 6.9 3.6* 4.2  HGB 13.3 13.5 12.7*  12.8*  HCT 40.0 40.4 37.1* 38.4*  PLT SEE NOTE 133* 111* 110*    COAGS:  Recent Labs  06/11/15 1057 08/21/15 1004 11/13/15 1611 11/26/15 1113 12/23/15 1330  INR 1.25 1.18 1.10* 1.27 1.35  APTT 32 36  --  34 36    BMP:  Recent Labs  06/11/15 1057 07/28/15 1012 08/21/15 1004 11/10/15 1135 11/13/15 1610 12/02/15 1024 12/23/15 1330  NA 138 138 139  --  140 139 139  K 4.1 3.7 3.9  --  3.7 3.9 3.7  CL 104 103 105  --   --   --  105  CO2 25 26 24   --  25 24 26   GLUCOSE 87 100* 113*  --  79 129 87  BUN 8 9 <5* 8 7.6 11.7 10  CALCIUM 8.6* 8.5* 8.6*  --  8.4 8.6 8.7*  CREATININE 0.95 0.79 0.94 0.84 0.9 1.0 1.08  GFRNONAA >60 >89 >60  --   --   --  >60  GFRAA >60 >89 >60  --   --   --  >60    LIVER FUNCTION TESTS:  Recent Labs  08/21/15 1004 11/13/15 1610 12/02/15 1024 12/23/15 1330  BILITOT 4.0* 4.41* 4.87* 3.0*  AST 197* 192* 104* 87*  ALT 91* 87* 70* 44  ALKPHOS 153* 180* 139 96  PROT 7.7 8.0 8.6* 7.9  ALBUMIN 2.6* 2.7* 2.6* 3.0*    Assessment and Plan: 65 y.o. male with past medical history significant for asthma, alcohol abuse and hepatitis C who was found to have an indeterminate 2.8 cm  liver lesion in segment 8 of right hepatic lobe on abdominal MRI performed 10/23/2015, confirmed on subsequent outside abdominal MRI performed 11/06/2015 for which the patient underwent a ultrasound-guided liver lesion biopsy on 11/26/2015 with pathology compatible with hepatocellular carcinoma. The patient was recently seen in consultation by Dr. Pascal Lux on 12/03/15 for treatment options and deemed an appropriate candidate for CT-guided thermal/microwave ablation of the [facility]. He presents today for the above procedure. Details/risks of procedure, including but not limited to, internal bleeding, infection, injury to adjacent organs (most importantly the biliary system) as well as incomplete tumor ablation, anesthesia related complications and deathd/w pt/family with their understanding and  consent. Postprocedure the patient will be admitted to hospital for overnight observation.   Electronically Signed: D. Rowe Robert 12/24/2015, 8:14 AM   I spent a total of 30 minutes at the the patient's bedside AND on the patient's hospital floor or unit, greater than 50% of which was counseling/coordinating care for CT-guided thermal ablation of hepatocellular carcinoma

## 2015-12-25 ENCOUNTER — Inpatient Hospital Stay (HOSPITAL_COMMUNITY): Payer: Self-pay

## 2015-12-25 ENCOUNTER — Other Ambulatory Visit: Payer: Self-pay | Admitting: *Deleted

## 2015-12-25 DIAGNOSIS — R160 Hepatomegaly, not elsewhere classified: Secondary | ICD-10-CM

## 2015-12-25 LAB — CBC WITH DIFFERENTIAL/PLATELET
BASOS PCT: 0 %
Basophils Absolute: 0 10*3/uL (ref 0.0–0.1)
EOS ABS: 0 10*3/uL (ref 0.0–0.7)
EOS PCT: 0 %
HCT: 41 % (ref 39.0–52.0)
HEMOGLOBIN: 13 g/dL (ref 13.0–17.0)
Lymphocytes Relative: 8 %
Lymphs Abs: 1.2 10*3/uL (ref 0.7–4.0)
MCH: 35 pg — ABNORMAL HIGH (ref 26.0–34.0)
MCHC: 31.7 g/dL (ref 30.0–36.0)
MCV: 110.5 fL — AB (ref 78.0–100.0)
MONO ABS: 1.9 10*3/uL — AB (ref 0.1–1.0)
Monocytes Relative: 12 %
NEUTROS ABS: 12.4 10*3/uL — AB (ref 1.7–7.7)
Neutrophils Relative %: 80 %
Platelets: 119 10*3/uL — ABNORMAL LOW (ref 150–400)
RBC: 3.71 MIL/uL — ABNORMAL LOW (ref 4.22–5.81)
RDW: 13.2 % (ref 11.5–15.5)
WBC: 15.5 10*3/uL — ABNORMAL HIGH (ref 4.0–10.5)

## 2015-12-25 LAB — COMPREHENSIVE METABOLIC PANEL
ALK PHOS: 90 U/L (ref 38–126)
ALT: 87 U/L — AB (ref 17–63)
ANION GAP: 6 (ref 5–15)
AST: 220 U/L — ABNORMAL HIGH (ref 15–41)
Albumin: 3.1 g/dL — ABNORMAL LOW (ref 3.5–5.0)
BUN: 22 mg/dL — ABNORMAL HIGH (ref 6–20)
CALCIUM: 8.1 mg/dL — AB (ref 8.9–10.3)
CO2: 28 mmol/L (ref 22–32)
Chloride: 100 mmol/L — ABNORMAL LOW (ref 101–111)
Creatinine, Ser: 1.45 mg/dL — ABNORMAL HIGH (ref 0.61–1.24)
GFR, EST AFRICAN AMERICAN: 57 mL/min — AB (ref >60)
GFR, EST NON AFRICAN AMERICAN: 49 mL/min — AB (ref >60)
Glucose, Bld: 111 mg/dL — ABNORMAL HIGH (ref 65–99)
Potassium: 5.5 mmol/L — ABNORMAL HIGH (ref 3.5–5.1)
SODIUM: 134 mmol/L — AB (ref 135–145)
TOTAL PROTEIN: 8.4 g/dL — AB (ref 6.5–8.1)
Total Bilirubin: 3.7 mg/dL — ABNORMAL HIGH (ref 0.3–1.2)

## 2015-12-25 MED ORDER — PIPERACILLIN-TAZOBACTAM 3.375 G IVPB
3.3750 g | Freq: Three times a day (TID) | INTRAVENOUS | Status: DC
  Administered 2015-12-25 – 2015-12-26 (×2): 3.375 g via INTRAVENOUS
  Filled 2015-12-25 (×2): qty 50

## 2015-12-25 MED ORDER — SODIUM CHLORIDE 0.9 % IV SOLN
INTRAVENOUS | Status: DC
  Administered 2015-12-25 – 2015-12-26 (×2): via INTRAVENOUS

## 2015-12-25 MED ORDER — ONDANSETRON HCL 4 MG/2ML IJ SOLN: 4.0000 mg | INTRAMUSCULAR | Status: DC | PRN

## 2015-12-25 MED ORDER — SODIUM CHLORIDE 0.9 % IV SOLN: INTRAVENOUS | Status: DC

## 2015-12-25 MED ORDER — SIMETHICONE 80 MG PO CHEW
160.0000 mg | CHEWABLE_TABLET | Freq: Four times a day (QID) | ORAL | Status: DC
  Administered 2015-12-25 – 2015-12-26 (×3): 160 mg via ORAL
  Filled 2015-12-25 (×3): qty 2

## 2015-12-25 MED ORDER — HYDROMORPHONE HCL 1 MG/ML IJ SOLN
0.5000 mg | INTRAMUSCULAR | Status: DC | PRN
  Administered 2015-12-25: 1 mg via INTRAVENOUS
  Filled 2015-12-25: qty 1

## 2015-12-25 MED ORDER — HYDROCODONE-ACETAMINOPHEN 5-325 MG PO TABS
1.0000 | ORAL_TABLET | ORAL | Status: DC | PRN
  Administered 2015-12-25 – 2015-12-26 (×2): 1 via ORAL
  Administered 2015-12-26: 2 via ORAL
  Filled 2015-12-25: qty 2
  Filled 2015-12-25 (×2): qty 1

## 2015-12-25 NOTE — Progress Notes (Signed)
Patient ID: Peter Nelson, male   DOB: 1951/09/14, 65 y.o.   MRN: [id number] Pt looks much better this afternoon.  He is more awake and alert.  He still complains of some left upper quadrant discomfort.  He know complains of some reflux type symptoms.  On his plain film, he has a large gastric bubble.  This may be causing him some gaseous type pains contributing to the LUQ discomfort.  He does state this is intermittent and not constant.  The oral pain medication is controlling his pain better as well.  He is still not eating or drinking much at all.  He is on 100cc of NS through his IV.  This will likely help his creatinine that bumped slightly after his procedure.  Suspect this bump as well as his K is likely from some dehydration and hemolysis after his procedure.   -we will remove his CT this afternoon as well as his PTX is stable and small.  All of this has been reviewed with Dr. Vernard Gambles.    Peter Nelson E 3:58 PM 12/25/2015

## 2015-12-25 NOTE — Progress Notes (Signed)
Patient ID: Peter Nelson, male   DOB: 1951/04/06, 65 y.o.   MRN: [id number]    Referring Physician(s): Truitt Merle  Supervising Physician: Arne Cleveland  Chief Complaint: S/p MWA with PTX  Subjective: The patient is having a lot of left sided abdominal pain today.  He denies nausea or vomiting, but isn't eating much.  He is lethargic and apparently didn't sleep much overnight.  He did have some hemoptysis overnight, but none since then.  He feels somewhat SOB this morning.    Allergies: Aspirin  Medications: Prior to Admission medications   Medication Sig Start Date End Date Taking? Authorizing Provider  ribavirin (REBETOL) 200 MG capsule Take 3 caps with breakfast and 2 caps with dinner Patient taking differently: Take 400-600 mg by mouth 2 (two) times daily. Take 3 caps with breakfast and 2 caps with dinner 11/12/15  Yes Thayer Headings, MD  Sofosbuvir-Velpatasvir (EPCLUSA) 400-100 MG TABS Take 1 tablet by mouth daily. 11/12/15  Yes Thayer Headings, MD  vitamin B-12 (CYANOCOBALAMIN) 1000 MCG tablet Take 1,000 mcg by mouth daily.   Yes Historical Provider, MD    Vital Signs: BP 137/81 mmHg  Pulse 115  Temp(Src) 97.5 F (36.4 C) (Oral)  Resp 9  Ht 5' 6"  (1.676 m)  Wt 143 lb 4 oz (64.978 kg)  BMI 23.13 kg/m2  SpO2 93%  Physical Exam: Gen: lethargic, falls asleep while talking to him Heart: tachy, but regular Lungs: CTAB, but unable to take a deep breath due to pain.  Left chest tube in place with no air leak, site is c/d/i.  Minimal serosang output Abd: skinny, a little hard, but guards some with palpation.  Tender in LUQ, but almost more over his rib cage than his abdomen. Site in RUQ is clean.  +BS, ND  Imaging: Ct Guide Tissue Ablation  12/24/2015  INDICATION: History of biopsy proven hepatocellular carcinoma. Patient presents today for microwave ablation of solitary approximately 2.8 cm lesion within the cranial aspect of the anterior segment of the right lobe of the liver.  EXAM: 1. CT-GUIDED PERCUTANEOUS THERMAL ABLATION OF LIVER LESION 2. CT-GUIDED CHEST TUBE PLACEMENT ANESTHESIA/SEDATION: General MEDICATIONS: Zosyn 3.375 g IV. The antibiotic was administered in an appropriate time interval prior to needle puncture of the skin. CONTRAST:  132m OMNIPAQUE IOHEXOL 300 MG/ML  SOLN PROCEDURE: The procedure, risks, benefits, and alternatives were explained to the patient. Questions regarding the procedure were encouraged and answered. The patient understands and consents to the procedure. The patient was placed under general anesthesia. Initial unenhanced CT was performed in a supine, slightly LPO position to localize the ill-defined approximately 2.8 x 2.7 cm lesion within the cranial aspect of the anterior segment of the right lobe of liver (image 16, series 2). The procedure was planned. The patient was prepped with Betadine in a sterile fashion, and a sterile drape was applied covering the operative field. A sterile gown and sterile gloves were used for the procedure. Under direct ultrasound guidance, a 22 gauge spinal needle was advanced into the caudal aspect of the liver lesion for procedural planning purposes and confirmed with limited CT imaging. Next, 15 PR Probe was attempted to be advanced into the liver however immediately following placement of the ablation probe, visualization becoming suboptimal with significant posterior acoustic shadowing. Subsequent CT imaging demonstrating transgression of the pleural surface and development of a small pneumothorax. As such, the right pleural space was accessed with a Yueh sheath needle end despite evacuation of a large  component of the pneumothorax, a significant basilar component persisted. As such, a short Amplatz wire was advanced into the anterior caudal aspect of the right pleural space allowing placement of a 10 French percutaneous drainage catheter. Despite near complete evacuation of the pneumothorax, sonographic window of the  hepatic lesion was impossible secondary to the basilar component of the pneumothorax and associated posterior acoustic shadowing. As such, decision was made to perform the ablation with CT guidance. Utilizing an anterior to posterior approach, a 15 NeuWave PR probe was advanced into the cranial-lateral aspect of the ill-defined hypo attenuating mass. Appropriate positioning was confirmed with interval CT imaging. Next, an additional 15 NeuWave PR probe was advanced into the caudal medial aspect of ill-defined hypoattenuating mass. Once appropriate positioning was confirmed, an 8-minute ablation was performed at 65 watts. Intra procedural imaging was performed following 2 minutes and were minutes of cryoablation cycle. Tract ablation was not performed both with the ablation probes were noted to transgress the anterior basilar aspect of the right middle lobe. Superficial hemostasis was achieved with manual compression. Postprocedural triple phase contrast-enhanced CT scan was obtained. A dressing was been placed. The patient tolerated the procedure well without additional postprocedural complication. COMPLICATIONS: SIR LEVEL C - Requires therapy, minor hospitalization (<48 hrs). Procedure complicated by a development of a pneumothorax necessitating placement of a chest tube for procedural purposes. FINDINGS: During attempted probe placement with ultrasound guidance, the caudal most aspect of the right pleural surface with transgressed resulting in development of a pneumothorax. This was treated with placement of a 10 French chest tube. Unfortunately, a basilar component of the pneumothorax persisted rendering additional ultrasound guidance useless. As such the decision was made to perform the ablation with CT guidance. Under intermittent CT guidance, 2 percutaneous microwave ablation probes were inserted into the mass. Real-time reformatted images generated during the procedure to ensure adequate treatment coverage.  Post ablation imaging demonstrates an adequate ablation zone and was negative for obvious complication, specifically, no pneumothorax or significant hemorrhage about the ablation site. IMPRESSION: 1. Successful CT guided percutaneous thermal ablation of solitary lesion within the anterior superior aspect of the right lobe of the liver. 2. Procedure complicated by development of a pneumothorax requiring chest tube placement for procedural purposes. PLAN: The patient will return to the interventional radiology clinic in 2-4 weeks. A CMP will be obtained at the time of this visit. No imaging is necessary for this initial postprocedural evaluation. Initial postprocedural surveillance imaging will be performed in approximately 3 months (early June 2017). Electronically Signed   By: Sandi Mariscal M.D.   On: 12/24/2015 14:28   Dg Chest Port 1 View  12/25/2015  CLINICAL DATA:  Pneumothorax, RIGHT chest tube EXAM: PORTABLE CHEST 1 VIEW COMPARISON:  Radiograph 08/31/2015 FINDINGS: Small bore RIGHT chest tube within the anterior lower RIGHT pleural space. Trace RIGHT apical pneumothorax identified. No subpulmonic pneumothorax appreciated. Normal cardiac silhouette. Central venous congestion pattern which is mild. Mild basilar atelectasis. IMPRESSION: 1. Tiny RIGHT apical pneumothorax with chest tube in place at the RIGHT lung base. 2. Mild pulmonary venous congestion. Electronically Signed   By: Suzy Bouchard M.D.   On: 12/25/2015 07:29   Ct Image Guided Drainage By Percutaneous Catheter  12/24/2015  INDICATION: History of biopsy proven hepatocellular carcinoma. Patient presents today for microwave ablation of solitary approximately 2.8 cm lesion within the cranial aspect of the anterior segment of the right lobe of the liver. EXAM: 1. CT-GUIDED PERCUTANEOUS THERMAL ABLATION OF LIVER LESION 2. CT-GUIDED CHEST  TUBE PLACEMENT ANESTHESIA/SEDATION: General MEDICATIONS: Zosyn 3.375 g IV. The antibiotic was administered in an  appropriate time interval prior to needle puncture of the skin. CONTRAST:  159m OMNIPAQUE IOHEXOL 300 MG/ML  SOLN PROCEDURE: The procedure, risks, benefits, and alternatives were explained to the patient. Questions regarding the procedure were encouraged and answered. The patient understands and consents to the procedure. The patient was placed under general anesthesia. Initial unenhanced CT was performed in a supine, slightly LPO position to localize the ill-defined approximately 2.8 x 2.7 cm lesion within the cranial aspect of the anterior segment of the right lobe of liver (image 16, series 2). The procedure was planned. The patient was prepped with Betadine in a sterile fashion, and a sterile drape was applied covering the operative field. A sterile gown and sterile gloves were used for the procedure. Under direct ultrasound guidance, a 22 gauge spinal needle was advanced into the caudal aspect of the liver lesion for procedural planning purposes and confirmed with limited CT imaging. Next, 15 PR Probe was attempted to be advanced into the liver however immediately following placement of the ablation probe, visualization becoming suboptimal with significant posterior acoustic shadowing. Subsequent CT imaging demonstrating transgression of the pleural surface and development of a small pneumothorax. As such, the right pleural space was accessed with a Yueh sheath needle end despite evacuation of a large component of the pneumothorax, a significant basilar component persisted. As such, a short Amplatz wire was advanced into the anterior caudal aspect of the right pleural space allowing placement of a 10 French percutaneous drainage catheter. Despite near complete evacuation of the pneumothorax, sonographic window of the hepatic lesion was impossible secondary to the basilar component of the pneumothorax and associated posterior acoustic shadowing. As such, decision was made to perform the ablation with CT  guidance. Utilizing an anterior to posterior approach, a 15 NeuWave PR probe was advanced into the cranial-lateral aspect of the ill-defined hypo attenuating mass. Appropriate positioning was confirmed with interval CT imaging. Next, an additional 15 NeuWave PR probe was advanced into the caudal medial aspect of ill-defined hypoattenuating mass. Once appropriate positioning was confirmed, an 8-minute ablation was performed at 65 watts. Intra procedural imaging was performed following 2 minutes and were minutes of cryoablation cycle. Tract ablation was not performed both with the ablation probes were noted to transgress the anterior basilar aspect of the right middle lobe. Superficial hemostasis was achieved with manual compression. Postprocedural triple phase contrast-enhanced CT scan was obtained. A dressing was been placed. The patient tolerated the procedure well without additional postprocedural complication. COMPLICATIONS: SIR LEVEL C - Requires therapy, minor hospitalization (<48 hrs). Procedure complicated by a development of a pneumothorax necessitating placement of a chest tube for procedural purposes. FINDINGS: During attempted probe placement with ultrasound guidance, the caudal most aspect of the right pleural surface with transgressed resulting in development of a pneumothorax. This was treated with placement of a 10 French chest tube. Unfortunately, a basilar component of the pneumothorax persisted rendering additional ultrasound guidance useless. As such the decision was made to perform the ablation with CT guidance. Under intermittent CT guidance, 2 percutaneous microwave ablation probes were inserted into the mass. Real-time reformatted images generated during the procedure to ensure adequate treatment coverage. Post ablation imaging demonstrates an adequate ablation zone and was negative for obvious complication, specifically, no pneumothorax or significant hemorrhage about the ablation site.  IMPRESSION: 1. Successful CT guided percutaneous thermal ablation of solitary lesion within the anterior superior aspect of the  right lobe of the liver. 2. Procedure complicated by development of a pneumothorax requiring chest tube placement for procedural purposes. PLAN: The patient will return to the interventional radiology clinic in 2-4 weeks. A CMP will be obtained at the time of this visit. No imaging is necessary for this initial postprocedural evaluation. Initial postprocedural surveillance imaging will be performed in approximately 3 months (early June 2017). Electronically Signed   By: Sandi Mariscal M.D.   On: 12/24/2015 14:28    Labs:  CBC:  Recent Labs  12/03/15 1545 12/16/15 12/23/15 1029 12/23/15 1330  WBC 5.5 6.9 3.6* 4.2  HGB 13.3 13.5 12.7* 12.8*  HCT 40.0 40.4 37.1* 38.4*  PLT SEE NOTE 133* 111* 110*    COAGS:  Recent Labs  06/11/15 1057 08/21/15 1004 11/13/15 1611 11/26/15 1113 12/23/15 1330  INR 1.25 1.18 1.10* 1.27 1.35  APTT 32 36  --  34 36    BMP:  Recent Labs  06/11/15 1057 07/28/15 1012 08/21/15 1004 11/10/15 1135 11/13/15 1610 12/02/15 1024 12/23/15 1330  NA 138 138 139  --  140 139 139  K 4.1 3.7 3.9  --  3.7 3.9 3.7  CL 104 103 105  --   --   --  105  CO2 25 26 24   --  25 24 26   GLUCOSE 87 100* 113*  --  79 129 87  BUN 8 9 <5* 8 7.6 11.7 10  CALCIUM 8.6* 8.5* 8.6*  --  8.4 8.6 8.7*  CREATININE 0.95 0.79 0.94 0.84 0.9 1.0 1.08  GFRNONAA >60 >89 >60  --   --   --  >60  GFRAA >60 >89 >60  --   --   --  >60    LIVER FUNCTION TESTS:  Recent Labs  08/21/15 1004 11/13/15 1610 12/02/15 1024 12/23/15 1330  BILITOT 4.0* 4.41* 4.87* 3.0*  AST 197* 192* 104* 87*  ALT 91* 87* 70* 44  ALKPHOS 153* 180* 139 96  PROT 7.7 8.0 8.6* 7.9  ALBUMIN 2.6* 2.7* 2.6* 3.0*    Assessment and Plan: 1. HCC, s/p MWA, complicated by PTX -CXR this am showed minimal apical PTX.  Will put chest tube on water seal and take off suction.  Repeat film  around 12 noon and determine if his CT can be removed or not -the patient himself appears to be having a lot of pain.  It is mostly left sided.  Unclear why he is having such bad left sided pain, may be irritation of the diaphragm from the procedure etc.   -due to his lethargy though as well as his desating etc, I am going to Dc his PCA and start him on oral vicodin (which may control his pain better) and have a small amount of dilaudid available as IV push for breakthrough. -it is possible pt may be able to dc later today if he is improved, but I'm uncertain he appears ready for DC today.  He is unsteady on his feet at home and is more so here.  Will d/w MD need for PT to evaluate the patient prior to DC home.  Although, this may improve once his lethargy improves as well. -will follow.  Electronically Signed: Henreitta Cea 12/25/2015, 10:01 AM   I spent a total of 25 Minutes at the the patient's bedside AND on the patient's hospital floor or unit, greater than 50% of which was counseling/coordinating care for [facility], s/p MWA with PTX

## 2015-12-26 ENCOUNTER — Inpatient Hospital Stay (HOSPITAL_COMMUNITY): Payer: Self-pay

## 2015-12-26 LAB — BASIC METABOLIC PANEL
Anion gap: 6 (ref 5–15)
BUN: 21 mg/dL — AB (ref 6–20)
CO2: 25 mmol/L (ref 22–32)
CREATININE: 1.19 mg/dL (ref 0.61–1.24)
Calcium: 7.6 mg/dL — ABNORMAL LOW (ref 8.9–10.3)
Chloride: 101 mmol/L (ref 101–111)
Glucose, Bld: 109 mg/dL — ABNORMAL HIGH (ref 65–99)
POTASSIUM: 3.8 mmol/L (ref 3.5–5.1)
SODIUM: 132 mmol/L — AB (ref 135–145)

## 2015-12-26 LAB — CBC
HCT: 31.3 % — ABNORMAL LOW (ref 39.0–52.0)
HEMOGLOBIN: 10.4 g/dL — AB (ref 13.0–17.0)
MCH: 34.4 pg — AB (ref 26.0–34.0)
MCHC: 33.2 g/dL (ref 30.0–36.0)
MCV: 103.6 fL — ABNORMAL HIGH (ref 78.0–100.0)
PLATELETS: 73 10*3/uL — AB (ref 150–400)
RBC: 3.02 MIL/uL — AB (ref 4.22–5.81)
RDW: 13 % (ref 11.5–15.5)
WBC: 11.2 10*3/uL — ABNORMAL HIGH (ref 4.0–10.5)

## 2015-12-26 NOTE — Progress Notes (Signed)
   12/26/15 1300  Clinical Encounter Type  Visited With Patient and family together  Visit Type Initial;Psychological support;Spiritual support;Other (Comment) (Advance Directive)  Referral From Nurse  Consult/Referral To Chaplain  Spiritual Encounters  Spiritual Needs Other (Comment);[employer] researcher)  Stress Factors  Patient Stress Factors None identified  Family Stress Factors None identified  Advance Directives (For Healthcare)  Does patient have an advance directive? No  Would patient like information on creating an advanced directive? Yes - Scientist, clinical (histocompatibility and immunogenetics) given   I provided the [place] papers to the patient. He does not wish to fill them out at this time, but wanted to review the materials and have them completed at a later date.    Gayle Mill M.Div.

## 2015-12-26 NOTE — Discharge Instructions (Signed)
Radiofrequency/Microwave Ablation of Liver Tumors, Care After Refer to this sheet in the next few weeks. These instructions provide you with information on caring for yourself after your procedure. Your health care provider may also give you more specific instructions. Your treatment has been planned according to current medical practices, but problems sometimes occur. Call your health care provider if you have any problems or questions after your procedure.  WHAT TO EXPECT AFTER THE PROCEDURE After your procedure, you may have pain and discomfort in the upper abdomen. You will be given pain medicines to control this.  HOME CARE INSTRUCTIONS  Take all medicines as directed by your health care provider.  Follow diet instructions as directed by your health care provider.  Follow instructions regarding rest and physical activity. SEEK MEDICAL CARE IF:  You cannot pass gas.   You cannot have a bowel movement within 2 days.   You have a skin rash.   You have a fever. SEEK IMMEDIATE MEDICAL CARE IF:  You have severe or lasting abdominal pain or pain in your shoulder or back.   You have trouble swallowing or breathing.   You have severe weakness or dizziness.   You have chest pain or shortness of breath.   This information is not intended to replace advice given to you by your health care provider. Make sure you discuss any questions you have with your health care provider.   Document Released: 07/25/2013 Document Revised: 10/09/2013 Document Reviewed: 07/25/2013 Elsevier Interactive Patient Education [employer].

## 2015-12-26 NOTE — Discharge Summary (Signed)
Patient ID: Peter Nelson MRN: [id number] DOB/AGE: 07-17-1951 65 y.o.  Admit date: 12/24/2015 Discharge date: 12/26/2015  Supervising Physician: Sandi Mariscal  Admission Diagnoses: Hepatocellular carcinoma  Discharge Diagnoses: Hepatocellular carcinoma and right pneumothorax, status post CT-guided percutaneous thermal ablation of solitary lesion/HCC within the anterior superior aspect of the right lobe of the liver and right chest tube placement 12/24/15 with subsequent resolution of pneumothorax/chest tube removal  Active Problems:   Hepatocellular carcinoma [facility])  Past Medical History  Diagnosis Date  . Hepatitis C   . Alcohol abuse   . Arthritis   . Asthma   . Shortness of breath dyspnea     with activity and anxiety  . Allergy   . Cancer ([place])     liver  . Wears glasses    Past Surgical History  Procedure Laterality Date  . Skin graft  1991    Left Hand      Discharged Condition: good  Hospital Course: Peter Nelson is a 65 y.o. male with past medical history significant for asthma, alcohol abuse and hepatitis C who was found to have an indeterminate 2.8 cm liver lesion in segment 8 of right hepatic lobe on abdominal MRI performed 10/23/2015, confirmed on subsequent outside abdominal MRI performed 11/06/2015 for which the patient underwent a ultrasound-guided liver lesion biopsy on 11/26/2015 with pathology compatible with hepatocellular carcinoma. The patient was recently seen in consultation by Dr. Pascal Lux on 12/03/15 for treatment options and deemed an appropriate candidate for CT-guided thermal/microwave ablation of the [facility]. On 12/24/15 the patient underwent CT-guided percutaneous thermal ablation of a solitary lesion within the anterior superior aspect of the right lobe of the liver by Dr. Pascal Lux. The procedure was performed under general anesthesia. The procedure was also complicated by development of a right chest pneumothorax which required chest tube placement. Postprocedure  the patient was admitted for pain control and monitoring of the pneumothorax. He was placed on a Dilaudid PCA pump but remained somewhat lethargic and subsequently changed to oral narcotic regimen. He did experience some left-sided abdominal pain post procedure which most likely was related to retained air in stomach. Chest x-ray on 3/9 revealed a very tiny right apical pneumothorax with mild pulmonary venous congestion. Patient denied any acute worsening of chest pain or shortness of breath and chest tube was removed on the afternoon of 3/9. Subsequent laboratory studies revealed expected elevation of WBC, as well as LFTs. Potassium was 5.5 and creatinine 1.45. Patient was aggressively hydrated and on 3/10 follow-up studies revealed potassium 3.8, creatinine 1.19, WBC 11.2, hemoglobin 10.4, platelets 73k. Follow-up chest x-ray on 3/10 revealed no pneumothorax or pulmonary edema. There was some residual right basilar atelectasis noted. On the day of discharge the patient was feeling better. He did have some intermittent right upper quadrant abdominal discomfort but overall improved since day of admission. The left-sided abdominal pain had resolved. He was able to tolerate his diet, ambulate and void without significant difficulty. Patient was seen by Dr. Pascal Lux and deemed stable for discharge at this time. He was given prescriptions for oxycodone, 5 mg, #20, no refills, 1 tablet every 6 hours as needed for moderate pain, Colace 100 mg, #20, no refills, 1 tablet twice daily as needed for constipation, Cipro 500 mg, #14, no refills, 1 tablet twice daily for 1 week, Zofran 8 mg, #20, 1 tablet twice daily as needed for nausea. He was encouraged to stay well hydrated, avoid alcohol use and contact our service with any additional  questions or concerns. He is scheduled for follow-up with Dr. Pascal Lux in the IR clinic on April 6. He will continue his current home medications and follow up with Dr. Burr Medico as  scheduled.  Consults: none  Significant Diagnostic Studies:  Results for orders placed or performed during the hospital encounter of 12/24/15  Urine rapid drug screen (hosp performed)  Result Value Ref Range   Opiates NONE DETECTED NONE DETECTED   Cocaine NONE DETECTED NONE DETECTED   Benzodiazepines NONE DETECTED NONE DETECTED   Amphetamines NONE DETECTED NONE DETECTED   Tetrahydrocannabinol NONE DETECTED NONE DETECTED   Barbiturates NONE DETECTED NONE DETECTED  CBC with Differential/Platelet  Result Value Ref Range   WBC 15.5 (H) 4.0 - 10.5 K/uL   RBC 3.71 (L) 4.22 - 5.81 MIL/uL   Hemoglobin 13.0 13.0 - 17.0 g/dL   HCT 41.0 39.0 - 52.0 %   MCV 110.5 (H) 78.0 - 100.0 fL   MCH 35.0 (H) 26.0 - 34.0 pg   MCHC 31.7 30.0 - 36.0 g/dL   RDW 13.2 11.5 - 15.5 %   Platelets 119 (L) 150 - 400 K/uL   Neutrophils Relative % 80 %   Lymphocytes Relative 8 %   Monocytes Relative 12 %   Eosinophils Relative 0 %   Basophils Relative 0 %   Neutro Abs 12.4 (H) 1.7 - 7.7 K/uL   Lymphs Abs 1.2 0.7 - 4.0 K/uL   Monocytes Absolute 1.9 (H) 0.1 - 1.0 K/uL   Eosinophils Absolute 0.0 0.0 - 0.7 K/uL   Basophils Absolute 0.0 0.0 - 0.1 K/uL   Smear Review LARGE PLATELETS PRESENT   Comprehensive metabolic panel  Result Value Ref Range   Sodium 134 (L) 135 - 145 mmol/L   Potassium 5.5 (H) 3.5 - 5.1 mmol/L   Chloride 100 (L) 101 - 111 mmol/L   CO2 28 22 - 32 mmol/L   Glucose, Bld 111 (H) 65 - 99 mg/dL   BUN 22 (H) 6 - 20 mg/dL   Creatinine, Ser 1.45 (H) 0.61 - 1.24 mg/dL   Calcium 8.1 (L) 8.9 - 10.3 mg/dL   Total Protein 8.4 (H) 6.5 - 8.1 g/dL   Albumin 3.1 (L) 3.5 - 5.0 g/dL   AST 220 (H) 15 - 41 U/L   ALT 87 (H) 17 - 63 U/L   Alkaline Phosphatase 90 38 - 126 U/L   Total Bilirubin 3.7 (H) 0.3 - 1.2 mg/dL   GFR calc non Af Amer 49 (L) >60 mL/min   GFR calc Af Amer 57 (L) >60 mL/min   Anion gap 6 5 - 15  CBC  Result Value Ref Range   WBC 11.2 (H) 4.0 - 10.5 K/uL   RBC 3.02 (L) 4.22 - 5.81  MIL/uL   Hemoglobin 10.4 (L) 13.0 - 17.0 g/dL   HCT 31.3 (L) 39.0 - 52.0 %   MCV 103.6 (H) 78.0 - 100.0 fL   MCH 34.4 (H) 26.0 - 34.0 pg   MCHC 33.2 30.0 - 36.0 g/dL   RDW 13.0 11.5 - 15.5 %   Platelets 73 (L) 150 - 400 K/uL  Basic metabolic panel  Result Value Ref Range   Sodium 132 (L) 135 - 145 mmol/L   Potassium 3.8 3.5 - 5.1 mmol/L   Chloride 101 101 - 111 mmol/L   CO2 25 22 - 32 mmol/L   Glucose, Bld 109 (H) 65 - 99 mg/dL   BUN 21 (H) 6 - 20 mg/dL   Creatinine, Ser 1.19  0.61 - 1.24 mg/dL   Calcium 7.6 (L) 8.9 - 10.3 mg/dL   GFR calc non Af Amer >60 >60 mL/min   GFR calc Af Amer >60 >60 mL/min   Anion gap 6 5 - 15     Treatments: CT-guided percutaneous thermal ablation of solitary lesion/HCC within the anterior superior aspect right lobe of liver; placement of a right chest tube for pneumothorax 12/24/15- via general anesthesia; subsequent removal of chest tube on 3/9 with resolution of pneumothorax  Discharge Exam: Blood pressure 124/87, pulse 87, temperature 98.1 F (36.7 C), temperature source Oral, resp. rate 16, height 5' 6"  (1.676 m), weight 143 lb 4 oz (64.978 kg), SpO2 94 %. Patient awake, alert. Chest with slightly diminished breath sounds right base, left clear; heart with regular rate and rhythm; abdomen soft, positive bowel sounds, puncture sites right upper abdominal region clean, dry, mildly tender to palpation, no distinct hematoma.  Lower extremities with no edema.  Disposition: home  Discharge Instructions    Call MD for:  difficulty breathing, headache or visual disturbances    Complete by:  As directed      Call MD for:  extreme fatigue    Complete by:  As directed      Call MD for:  hives    Complete by:  As directed      Call MD for:  persistant dizziness or light-headedness    Complete by:  As directed      Call MD for:  persistant nausea and vomiting    Complete by:  As directed      Call MD for:  redness, tenderness, or signs of infection (pain,  swelling, redness, odor or green/yellow discharge around incision site)    Complete by:  As directed      Call MD for:  severe uncontrolled pain    Complete by:  As directed      Call MD for:  temperature >100.4    Complete by:  As directed      Change dressing (specify)    Complete by:  As directed   May change bandages over right upper abdominal region daily for the next 2-3 days     Diet - low sodium heart healthy    Complete by:  As directed      Driving Restrictions    Complete by:  As directed   No driving for the next 48 hours     Increase activity slowly    Complete by:  As directed      Lifting restrictions    Complete by:  As directed   No heavy lifting for the next 3-4 days     May shower / Bathe    Complete by:  As directed      May walk up steps    Complete by:  As directed      Other Restrictions    Complete by:  As directed   Please restrict alcohol use if possible; do not take pain medications with alcohol            Medication List    TAKE these medications        ribavirin 200 MG capsule  Commonly known as:  REBETOL  Take 3 caps with breakfast and 2 caps with dinner     Sofosbuvir-Velpatasvir 400-100 MG Tabs  Commonly known as:  EPCLUSA  Take 1 tablet by mouth daily.     vitamin B-12 1000 MCG tablet  Commonly known as:  CYANOCOBALAMIN  Take 1,000 mcg by mouth daily.           Follow-up Information    Follow up with Sandi Mariscal, MD.   Specialty:  Interventional Radiology   Why:  Gwenlyn Saran scheduled for follow-up with Dr. Pascal Lux in [place] clinic on April 6; please call [phone] with any questions   Contact information:   [address] [place] [address] [phone]       Follow up with Truitt Merle, MD.   Specialties:  Hematology, Oncology   Why:  Continue follow-up with Dr.Feng as scheduled   Contact information:   [place] [address] [phone]        Electronically Signed: D. Rowe Robert 12/26/2015,  11:46 AM   I have spent less than 30 minutes discharging Peter Nelson.

## 2016-01-06 MED FILL — RIBAVIRIN 200 MG TABLET: 200 | 28 days supply | Qty: 140 | Fill #2

## 2016-01-22 ENCOUNTER — Telehealth: Payer: Self-pay | Admitting: Radiology

## 2016-01-22 ENCOUNTER — Ambulatory Visit
Admit: 2016-01-22 | Discharge: 2016-01-22 | Disposition: A | Discharge status: Home / Self Care | Payer: No Typology Code available for payment source | Attending: Interventional Radiology | Admitting: Interventional Radiology

## 2016-01-22 LAB — COMPLETE METABOLIC PANEL WITH GFR
ALBUMIN: 3.1 g/dL — AB (ref 3.6–5.1)
ALK PHOS: 92 U/L (ref 40–115)
ALT: 33 U/L (ref 9–46)
AST: 70 U/L — ABNORMAL HIGH (ref 10–35)
BUN: 13 mg/dL (ref 7–25)
CALCIUM: 8.5 mg/dL — AB (ref 8.6–10.3)
CO2: 21 mmol/L (ref 20–31)
Chloride: 104 mmol/L (ref 98–110)
Creat: 0.9 mg/dL (ref 0.70–1.25)
GLUCOSE: 92 mg/dL (ref 65–99)
POTASSIUM: 3.5 mmol/L (ref 3.5–5.3)
SODIUM: 136 mmol/L (ref 135–146)
Total Bilirubin: 2.7 mg/dL — ABNORMAL HIGH (ref 0.2–1.2)
Total Protein: 7.7 g/dL (ref 6.1–8.1)

## 2016-01-22 NOTE — Progress Notes (Signed)
Patient ID: Peter Nelson, male   DOB: [birth year], 65 y.o.   MRN: [id number]        Chief Complaint: Hepatocellular carcinoma, post microwave ablation  Referring Physician(s): Mosetta Putt (Oncology); Pyrtle (GI)  History of Present Illness: Peter Nelson is a 65 y.o. male with past medical history significant for asthma, alcohol abuse and hepatitis C who underwent a technically successful percutaneous microwave ablation of biopsy proven hepatocellular carcinoma within the dome of the right lobe of the liver on 12/24/2015. The procedure was complicated by development of a procedural pneumothorax requiring placement of a chest tube however fortunately the chest tube was able to be removed after approximately 24 hours of low wall suction and the patient was discharged on 12/26/2015 without incident.  The patient returns to the interventional radiology clinic for post procedural evaluation and management. He is unaccompanied and serves as his own historian.  The patient states that he experiences transient right upper quadrant abdominal pain which lasts 1-2 minutes approximately once per day. This pain is not lifestyle limiting and the patient does not take any pain medications for this discomfort. She he is otherwise without complaint. He denies fever or chills. No yellowing of the skin or eyes.   Past Medical History  Diagnosis Date  . Hepatitis C   . Alcohol abuse   . Arthritis   . Asthma   . Shortness of breath dyspnea     with activity and anxiety  . Allergy   . Cancer (HCC)     liver  . Wears glasses     Past Surgical History  Procedure Laterality Date  . Skin graft  1991    Left Hand    Allergies: Aspirin  Medications: Prior to Admission medications   Medication Sig Start Date End Date Taking? Authorizing Provider  ribavirin (REBETOL) 200 MG capsule Take 3 caps with breakfast and 2 caps with dinner Patient taking differently: Take 400-600 mg by mouth 2 (two) times daily. Take 3  caps with breakfast and 2 caps with dinner 11/12/15  Yes Gardiner Barefoot, MD  Sofosbuvir-Velpatasvir (EPCLUSA) 400-100 MG TABS Take 1 tablet by mouth daily. 11/12/15  Yes Gardiner Barefoot, MD  vitamin B-12 (CYANOCOBALAMIN) 1000 MCG tablet Take 1,000 mcg by mouth daily.   Yes Historical Provider, MD     Family History  Problem Relation Age of Onset  . Hypertension Mother   . Diabetes Mother   . Heart disease Mother   . Alzheimer's disease Mother   . Diabetes Sister   . Diabetes Father   . Cancer Brother     Liver  . Colon cancer Neg Hx   . Esophageal cancer Neg Hx   . Stomach cancer Neg Hx   . Rectal cancer Neg Hx     Social History   Social History  . Marital Status: Married    Spouse Name: [name]  . Number of Children: 9  . Years of Education: [name]   Social History Main Topics  . Smoking status: Current Every Day Smoker -- 0.50 packs/day for 55 years    Types: Cigarettes  . Smokeless tobacco: Never Used     Comment: cutting back  . Alcohol Use: 0.0 oz/week    0 Standard drinks or equivalent per week     Comment: "a 1/2 pint a day" for more than 30 years, average 1 pint a day, cut back lately   . Drug Use: No  . Sexual Activity: Not on file  Other Topics Concern  . Not on file   Social History Narrative   Married Dec 22nd, 2016 to [place]   Has #9 children    ECOG Status: 1 - Symptomatic but completely ambulatory  Review of Systems: A 12 point ROS discussed and pertinent positives are indicated in the HPI above.  All other systems are negative.  Review of Systems  Constitutional: Negative for fever and fatigue.  Respiratory: Negative.   Cardiovascular: Negative.   Gastrointestinal: Positive for abdominal pain. Negative for nausea, diarrhea and abdominal distention.  Skin: Negative.     Vital Signs: BP 163/79 mmHg  Pulse 83  Temp(Src) 98.2 F (36.8 C) (Oral)  Resp 14  Ht 6' (1.829 m)  Wt 140 lb (63.504 kg)  BMI 18.98 kg/m2  SpO2 100%  Physical Exam    Constitutional: He appears well-developed and well-nourished.  HENT:  Head: Normocephalic and atraumatic.  Pulmonary/Chest: Effort normal and breath sounds normal.  Abdominal: Soft. Bowel sounds are normal. He exhibits no distension. There is no tenderness.  Well healed ablation access sites.  Psychiatric: He has a normal mood and affect. His behavior is normal.  Nursing note and vitals reviewed.    Imaging: Ct Guide Tissue Ablation  12/24/2015  INDICATION: History of biopsy proven hepatocellular carcinoma. Patient presents today for microwave ablation of solitary approximately 2.8 cm lesion within the cranial aspect of the anterior segment of the right lobe of the liver. EXAM: 1. CT-GUIDED PERCUTANEOUS THERMAL ABLATION OF LIVER LESION 2. CT-GUIDED CHEST TUBE PLACEMENT ANESTHESIA/SEDATION: General MEDICATIONS: Zosyn 3.375 g IV. The antibiotic was administered in an appropriate time interval prior to needle puncture of the skin. CONTRAST:  OMNIPAQUE IOHEXOL 300 MG/ML  SOLN PROCEDURE: The procedure, risks, benefits, and alternatives were explained to the patient. Questions regarding the procedure were encouraged and answered. The patient understands and consents to the procedure. The patient was placed under general anesthesia. Initial unenhanced CT was performed in a supine, slightly LPO position to localize the ill-defined approximately 2.8 x 2.7 cm lesion within the cranial aspect of the anterior segment of the right lobe of liver (image 16, series 2). The procedure was planned. The patient was prepped with Betadine in a sterile fashion, and a sterile drape was applied covering the operative field. A sterile gown and sterile gloves were used for the procedure. Under direct ultrasound guidance, a 22 gauge spinal needle was advanced into the caudal aspect of the liver lesion for procedural planning purposes and confirmed with limited CT imaging. Next, 15 PR Probe was attempted to be advanced into  the liver however immediately following placement of the ablation probe, visualization becoming suboptimal with significant posterior acoustic shadowing. Subsequent CT imaging demonstrating transgression of the pleural surface and development of a small pneumothorax. As such, the right pleural space was accessed with a Yueh sheath needle end despite evacuation of a large component of the pneumothorax, a significant basilar component persisted. As such, a short Amplatz wire was advanced into the anterior caudal aspect of the right pleural space allowing placement of a 10 French percutaneous drainage catheter. Despite near complete evacuation of the pneumothorax, sonographic window of the hepatic lesion was impossible secondary to the basilar component of the pneumothorax and associated posterior acoustic shadowing. As such, decision was made to perform the ablation with CT guidance. Utilizing an anterior to posterior approach, a 15 NeuWave PR probe was advanced into the cranial-lateral aspect of the ill-defined hypo attenuating mass. Appropriate positioning was confirmed with interval  CT imaging. Next, an additional 15 NeuWave PR probe was advanced into the caudal medial aspect of ill-defined hypoattenuating mass. Once appropriate positioning was confirmed, an 8-minute ablation was performed at [address] [place]. Intra procedural imaging was performed following 2 minutes and were minutes of cryoablation cycle. Tract ablation was not performed both with the ablation probes were noted to transgress the anterior basilar aspect of the right middle lobe. Superficial hemostasis was achieved with manual compression. Postprocedural triple phase contrast-enhanced CT scan was obtained. A dressing was been placed. The patient tolerated the procedure well without additional postprocedural complication. COMPLICATIONS: SIR LEVEL C - Requires therapy, minor hospitalization (<48 hrs). Procedure complicated by a development of a pneumothorax  necessitating placement of a chest tube for procedural purposes. FINDINGS: During attempted probe placement with ultrasound guidance, the caudal most aspect of the right pleural surface with transgressed resulting in development of a pneumothorax. This was treated with placement of a 10 French chest tube. Unfortunately, a basilar component of the pneumothorax persisted rendering additional ultrasound guidance useless. As such the decision was made to perform the ablation with CT guidance. Under intermittent CT guidance, 2 percutaneous microwave ablation probes were inserted into the mass. Real-time reformatted images generated during the procedure to ensure adequate treatment coverage. Post ablation imaging demonstrates an adequate ablation zone and was negative for obvious complication, specifically, no pneumothorax or significant hemorrhage about the ablation site. IMPRESSION: 1. Successful CT guided percutaneous thermal ablation of solitary lesion within the anterior superior aspect of the right lobe of the liver. 2. Procedure complicated by development of a pneumothorax requiring chest tube placement for procedural purposes. PLAN: The patient will return to the interventional radiology clinic in 2-4 weeks. A CMP will be obtained at the time of this visit. No imaging is necessary for this initial postprocedural evaluation. Initial postprocedural surveillance imaging will be performed in approximately 3 months (early June 2017). Electronically Signed   By: Simonne Come M.D.   On: 12/24/2015 14:28   Dg Chest Port 1 View  12/26/2015  CLINICAL DATA:  Follow-up pneumothorax, chest tube removal EXAM: PORTABLE CHEST 1 VIEW COMPARISON:  12/25/2015 FINDINGS: Right chest tube catheter has been removed. There is no pneumothorax. Cardiomediastinal silhouette is stable. Residual mild streaky atelectasis or scarring right base. No pulmonary edema. IMPRESSION: Right chest tube has been removed. No pneumothorax. Again noted  residual right basilar atelectasis or scarring. No pulmonary edema. Electronically Signed   By: Natasha Mead M.D.   On: 12/26/2015 09:26   Dg Chest Port 1 View  12/25/2015  CLINICAL DATA:  Followup pneumothorax, current history of liver cancer status post ablation EXAM: PORTABLE CHEST 1 VIEW COMPARISON:  12/24/2015, 12/25/2015 FINDINGS: Small caliber chest tube at right lung base. Stable tiny approximately 2 mm right apical pneumothorax. Stable cardiac enlargement.  Mild right base atelectasis. IMPRESSION: Stable tiny right apical pneumothorax Electronically Signed   By: Esperanza Heir M.D.   On: 12/25/2015 13:44   Dg Chest Port 1 View  12/25/2015  CLINICAL DATA:  Pneumothorax, RIGHT chest tube EXAM: PORTABLE CHEST 1 VIEW COMPARISON:  Radiograph 08/31/2015 FINDINGS: Small bore RIGHT chest tube within the anterior lower RIGHT pleural space. Trace RIGHT apical pneumothorax identified. No subpulmonic pneumothorax appreciated. Normal cardiac silhouette. Central venous congestion pattern which is mild. Mild basilar atelectasis. IMPRESSION: 1. Tiny RIGHT apical pneumothorax with chest tube in place at the RIGHT lung base. 2. Mild pulmonary venous congestion. Electronically Signed   By: Genevive Bi M.D.   On: 12/25/2015  07:29   Ct Image Guided Drainage By Percutaneous Catheter  12/24/2015  INDICATION: History of biopsy proven hepatocellular carcinoma. Patient presents today for microwave ablation of solitary approximately 2.8 cm lesion within the cranial aspect of the anterior segment of the right lobe of the liver. EXAM: 1. CT-GUIDED PERCUTANEOUS THERMAL ABLATION OF LIVER LESION 2. CT-GUIDED CHEST TUBE PLACEMENT ANESTHESIA/SEDATION: General MEDICATIONS: Zosyn 3.375 g IV. The antibiotic was administered in an appropriate time interval prior to needle puncture of the skin. CONTRAST:  OMNIPAQUE IOHEXOL 300 MG/ML  SOLN PROCEDURE: The procedure, risks, benefits, and alternatives were explained to the patient.  Questions regarding the procedure were encouraged and answered. The patient understands and consents to the procedure. The patient was placed under general anesthesia. Initial unenhanced CT was performed in a supine, slightly LPO position to localize the ill-defined approximately 2.8 x 2.7 cm lesion within the cranial aspect of the anterior segment of the right lobe of liver (image 16, series 2). The procedure was planned. The patient was prepped with Betadine in a sterile fashion, and a sterile drape was applied covering the operative field. A sterile gown and sterile gloves were used for the procedure. Under direct ultrasound guidance, a 22 gauge spinal needle was advanced into the caudal aspect of the liver lesion for procedural planning purposes and confirmed with limited CT imaging. Next, 15 PR Probe was attempted to be advanced into the liver however immediately following placement of the ablation probe, visualization becoming suboptimal with significant posterior acoustic shadowing. Subsequent CT imaging demonstrating transgression of the pleural surface and development of a small pneumothorax. As such, the right pleural space was accessed with a Yueh sheath needle end despite evacuation of a large component of the pneumothorax, a significant basilar component persisted. As such, a short Amplatz wire was advanced into the anterior caudal aspect of the right pleural space allowing placement of a 10 French percutaneous drainage catheter. Despite near complete evacuation of the pneumothorax, sonographic window of the hepatic lesion was impossible secondary to the basilar component of the pneumothorax and associated posterior acoustic shadowing. As such, decision was made to perform the ablation with CT guidance. Utilizing an anterior to posterior approach, a 15 NeuWave PR probe was advanced into the cranial-lateral aspect of the ill-defined hypo attenuating mass. Appropriate positioning was confirmed with  interval CT imaging. Next, an additional 15 NeuWave PR probe was advanced into the caudal medial aspect of ill-defined hypoattenuating mass. Once appropriate positioning was confirmed, an 8-minute ablation was performed at [address]. Intra procedural imaging was performed following 2 minutes and were minutes of cryoablation cycle. Tract ablation was not performed both with the ablation probes were noted to transgress the anterior basilar aspect of the right middle lobe. Superficial hemostasis was achieved with manual compression. Postprocedural triple phase contrast-enhanced CT scan was obtained. A dressing was been placed. The patient tolerated the procedure well without additional postprocedural complication. COMPLICATIONS: SIR LEVEL C - Requires therapy, minor hospitalization (<48 hrs). Procedure complicated by a development of a pneumothorax necessitating placement of a chest tube for procedural purposes. FINDINGS: During attempted probe placement with ultrasound guidance, the caudal most aspect of the right pleural surface with transgressed resulting in development of a pneumothorax. This was treated with placement of a 10 French chest tube. Unfortunately, a basilar component of the pneumothorax persisted rendering additional ultrasound guidance useless. As such the decision was made to perform the ablation with CT guidance. Under intermittent CT guidance, 2 percutaneous microwave ablation probes were  inserted into the mass. Real-time reformatted images generated during the procedure to ensure adequate treatment coverage. Post ablation imaging demonstrates an adequate ablation zone and was negative for obvious complication, specifically, no pneumothorax or significant hemorrhage about the ablation site. IMPRESSION: 1. Successful CT guided percutaneous thermal ablation of solitary lesion within the anterior superior aspect of the right lobe of the liver. 2. Procedure complicated by development of a pneumothorax  requiring chest tube placement for procedural purposes. PLAN: The patient will return to the interventional radiology clinic in 2-4 weeks. A CMP will be obtained at the time of this visit. No imaging is necessary for this initial postprocedural evaluation. Initial postprocedural surveillance imaging will be performed in approximately 3 months (early June 2017). Electronically Signed   By: Simonne Come M.D.   On: 12/24/2015 14:28    Labs:  CBC:  Recent Labs  12/23/15 1029 12/23/15 1330 12/25/15 1018 12/26/15 0325  WBC 3.6* 4.2 15.5* 11.2*  HGB 12.7* 12.8* 13.0 10.4*  HCT 37.1* 38.4* 41.0 31.3*  PLT 111* 110* 119* 73*    COAGS:  Recent Labs  06/11/15 1057 08/21/15 1004 11/13/15 1611 11/26/15 1113 12/23/15 1330  INR 1.25 1.18 1.10* 1.27 1.35  APTT 32 36  --  34 36    BMP:  Recent Labs  08/21/15 1004  12/02/15 1024 12/23/15 1330 12/25/15 1018 12/26/15 0325  NA 139  < > 139 139 134* 132*  K 3.9  < > 3.9 3.7 5.5* 3.8  CL 105  --   --  105 100* 101  CO2 24  < > 24 26 28 25   GLUCOSE 113*  < > 129 87 111* 109*  BUN <5*  < > 11.7 10 22* 21*  CALCIUM 8.6*  < > 8.6 8.7* 8.1* 7.6*  CREATININE 0.94  < > 1.0 1.08 1.45* 1.19  GFRNONAA >60  --   --  >60 49* >60  GFRAA >60  --   --  >60 57* >60  < > = values in this interval not displayed.  LIVER FUNCTION TESTS:  Recent Labs  11/13/15 1610 12/02/15 1024 12/23/15 1330 12/25/15 1018  BILITOT 4.41* 4.87* 3.0* 3.7*  AST 192* 104* 87* 220*  ALT 87* 70* 44 87*  ALKPHOS 180* 139 96 90  PROT 8.0 8.6* 7.9 8.4*  ALBUMIN 2.7* 2.6* 3.0* 3.1*    TUMOR MARKERS:  Recent Labs  10/30/15 1032  AFPTM 95.2*    Assessment and Plan:  Peter Nelson is a 65 y.o. male with past medical history significant for asthma, alcohol abuse and hepatitis C who underwent a technically successful percutaneous microwave ablation of biopsy proven hepatocellular carcinoma within the dome of the right lobe of the liver on 12/24/2015.   The  patient admits to transient mild expected post procedural right upper quadrant abdominal pain.  He is otherwise without complaint has recovered completely from the procedure.  As post procedural contrast enhanced imaging demonstrated a technically excellent result, I will obtain the first surveillance abdominal MRI 3 months following the microwave ablation (June 2017).   An AFP value will also be obtained at the approximate time of the abdominal MRI.  The patient will be seen in follow-up consultation at the IR clinic following the acquisition of this initial postprocedural examination.  The patient was encouraged to keep all subsequent appointments with Drs. Feng (Oncology) and Pyrtle (GI).  The patient was encouraged to call the interventional radiology clinic with any interval questions or concerns.  A  copy of this report was sent to the requesting providers on this date.  Electronically Signed: Simonne Come 01/22/2016, 8:52 AM   I spent a total of 15 Minutes in face to face in clinical consultation, greater than 50% of which was counseling/coordinating care for biopsy proven hepatocellular carcinoma, post mic with ablation

## 2016-01-26 ENCOUNTER — Encounter: Payer: Self-pay | Admitting: Family Medicine

## 2016-01-26 ENCOUNTER — Ambulatory Visit (INDEPENDENT_AMBULATORY_CARE_PROVIDER_SITE_OTHER): Payer: No Typology Code available for payment source | Admitting: Family Medicine

## 2016-01-26 VITALS — BP 140/74 | HR 91 | Temp 98.4°F | Ht 66.5 in | Wt 135.0 lb

## 2016-01-26 DIAGNOSIS — L309 Dermatitis, unspecified: Secondary | ICD-10-CM

## 2016-01-26 MED ORDER — HYDROCORTISONE 1 % EX OINT: 1.0000 "application " | TOPICAL_OINTMENT | Freq: Two times a day (BID) | CUTANEOUS | Status: DC

## 2016-01-26 NOTE — Patient Instructions (Signed)
Use hydrocortisone ointment twice a day as needed for ecezma. Follow-up with specialist as planned.

## 2016-01-26 NOTE — Progress Notes (Signed)
Patient ID: OMARR HANN, male   DOB: 02-Jan-1951, 65 y.o.   MRN: [id number]   Manraj Yeo, is a 65 y.o. male  MVE:[id number]  GEZ:[id number]  DOB - June 27, 1951  CC:  Chief Complaint  Patient presents with  . follow up    no complaints       HPI: Burgess Sheriff is a 65 y.o. male here to for routine follw-up. He has Hep C and is followed by [facility], Hepatocellular carcinoma and is followed by oncology. On March 8, he had a microwave/thermal ablation of liver lesion with resultant pneumothorax. He is doing well since that time. He does continue to smoke about 10 cagarettes a day and is not ready to quit. He has cut back on alcohol consumption but still drinks about 1/2 pint a day. He does have a long history of alcohol and tobacco abuse. He voices no comlaints today other than eczema and request a prescription. He denies chest pain, palpitations and shortness of breath. He has recently had bloodwork and nothing needs repeating today.  Allergies  Allergen Reactions  . Aspirin Other (See Comments)    Does not take because of hep c   Past Medical History  Diagnosis Date  . Hepatitis C   . Alcohol abuse   . Arthritis   . Asthma   . Shortness of breath dyspnea     with activity and anxiety  . Allergy   . Cancer ([place])     liver  . Wears glasses    Current Outpatient Prescriptions on File Prior to Visit  Medication Sig Dispense Refill  . ribavirin (REBETOL) 200 MG capsule Take 3 caps with breakfast and 2 caps with dinner (Patient taking differently: Take 400-600 mg by mouth 2 (two) times daily. Take 3 caps with breakfast and 2 caps with dinner) 140 capsule 2  . vitamin B-12 (CYANOCOBALAMIN) 1000 MCG tablet Take 1,000 mcg by mouth daily.    . Sofosbuvir-Velpatasvir (EPCLUSA) 400-100 MG TABS Take 1 tablet by mouth daily. (Patient not taking: Reported on 01/26/2016) 28 tablet 2   No current facility-administered medications on file prior to visit.   Family History  Problem Relation Age of  Onset  . Hypertension Mother   . Diabetes Mother   . Heart disease Mother   . Alzheimer's disease Mother   . Diabetes Sister   . Diabetes Father   . Cancer Brother     Liver  . Colon cancer Neg Hx   . Esophageal cancer Neg Hx   . Stomach cancer Neg Hx   . Rectal cancer Neg Hx    Social History   Social History  . Marital Status: Married    Spouse Name: [name]  . Number of Children: 38  . Years of Education: [name]   Occupational History  . Not on file.   Social History Main Topics  . Smoking status: Current Every Day Smoker -- 0.50 packs/day for 55 years    Types: Cigarettes  . Smokeless tobacco: Never Used     Comment: cutting back  . Alcohol Use: 0.0 oz/week    0 Standard drinks or equivalent per week     Comment: "a 1/2 pint a day" for more than 30 years, average 1 pint a day, cut back lately   . Drug Use: No  . Sexual Activity: Not on file   Other Topics Concern  . Not on file   Social History Narrative   Married Dec 22nd, 2016 to [place]  Has #9 children    Review of Systems: Constitutional: Negative for fever, chills, appetite change, weight loss,  Fatigue. Skin: Positive for rash, particularly on lower legs HENT: Negative for ear pain, ear discharge.nose bleeds Eyes: Negative for pain, discharge, redness, itching and visual disturbance. Neck: Negative for pain, stiffness Respiratory: Negative for cough, shortness of breath,   Cardiovascular: Negative for chest pain, palpitations and leg swelling. Gastrointestinal: Negative for abdominal pain, nausea, vomiting, diarrhea, constipations Genitourinary: Negative for dysuria, urgency, frequency, hematuria,  Musculoskeletal: Negative for back pain, joint pain, joint  swelling, and gait problem.Negative for weakness. Neurological: Negative for dizziness, tremors, seizures, syncope,   light-headedness, numbness and headaches.  Hematological: Negative for easy bruising or bleeding Psychiatric/Behavioral: Negative for  depression, anxiety, decreased concentration, confusion   Objective:   Filed Vitals:   01/26/16 1005  BP: 140/74  Pulse: 91  Temp: 98.4 F (36.9 C)    Physical Exam: Constitutional: Patient appears well-developed and well-nourished. No distress. HENT: Normocephalic, atraumatic, External right and left ear normal. Oropharynx is clear and moist.  Eyes: Conjunctivae and EOM are normal. PERRLA. Positive for muddy sclera Neck: Normal ROM. Neck supple. No lymphadenopathy, No thyromegaly. CVS: RRR, S1/S2 +, no murmurs, no gallops, no rubs Pulmonary: Effort and breath sounds normal, no stridor, rhonchi, wheezes, rales.  Abdominal: Soft. Normoactive BS,, no distension, tenderness, rebound or guarding.  Musculoskeletal: Normal range of motion. No edema and no tenderness.  Neuro: Alert.Normal muscle tone coordination. Non-focal Skin: Skin is warm and dry. No rash noted. Not diaphoretic. No erythema. No pallor. Psychiatric: Normal mood and affect. Behavior, judgment, thought content normal.  Lab Results  Component Value Date   WBC 11.2* 12/26/2015   HGB 10.4* 12/26/2015   HCT 31.3* 12/26/2015   MCV 103.6* 12/26/2015   PLT 73* 12/26/2015   Lab Results  Component Value Date   CREATININE 0.90 01/22/2016   BUN 13 01/22/2016   NA 136 01/22/2016   K 3.5 01/22/2016   CL 104 01/22/2016   CO2 21 01/22/2016    No results found for: HGBA1C Lipid Panel     Component Value Date/Time   CHOL 177 07/28/2015 1012   TRIG 122 07/28/2015 1012   HDL 59 07/28/2015 1012   CHOLHDL 3.0 07/28/2015 1012   VLDL 24 07/28/2015 1012   LDLCALC 94 07/28/2015 1012       Assessment and plan:   1. Eczema  - hydrocortisone 1 % ointment; Apply 1 application topically 2 (two) times daily.  Dispense: 1 g; Refill: 1  2. Alcohol use -encourage to continue his efforts to stop.  3. Tobacco use -Encourage to consider stopping.  Return in about 6 months (around 07/27/2016).  The patient was given clear  instructions to go to ER or return to medical center if symptoms don't improve, worsen or new problems develop. The patient verbalized understanding.    Micheline Chapman FNP  01/26/2016, 10:28 AM

## 2016-02-11 NOTE — Telephone Encounter (Signed)
[email or web address]

## 2016-02-19 ENCOUNTER — Encounter: Payer: Self-pay | Admitting: Internal Medicine

## 2016-02-19 ENCOUNTER — Ambulatory Visit (INDEPENDENT_AMBULATORY_CARE_PROVIDER_SITE_OTHER): Payer: No Typology Code available for payment source | Admitting: Internal Medicine

## 2016-02-19 VITALS — BP 174/85 | HR 96 | Temp 98.1°F | Wt 135.0 lb

## 2016-02-19 DIAGNOSIS — K7460 Unspecified cirrhosis of liver: Secondary | ICD-10-CM

## 2016-02-19 DIAGNOSIS — K7030 Alcoholic cirrhosis of liver without ascites: Secondary | ICD-10-CM

## 2016-02-19 DIAGNOSIS — F17200 Nicotine dependence, unspecified, uncomplicated: Secondary | ICD-10-CM

## 2016-02-19 DIAGNOSIS — C220 Liver cell carcinoma: Secondary | ICD-10-CM

## 2016-02-19 DIAGNOSIS — B182 Chronic viral hepatitis C: Secondary | ICD-10-CM

## 2016-02-19 LAB — CBC WITH DIFFERENTIAL/PLATELET
BASOS ABS: 37 {cells}/uL (ref 0–200)
BASOS PCT: 1 %
EOS ABS: 74 {cells}/uL (ref 15–500)
Eosinophils Relative: 2 %
HEMATOCRIT: 42.1 % (ref 38.5–50.0)
HEMOGLOBIN: 14.1 g/dL (ref 13.2–17.1)
LYMPHS ABS: 1369 {cells}/uL (ref 850–3900)
Lymphocytes Relative: 37 %
MCH: 33.3 pg — ABNORMAL HIGH (ref 27.0–33.0)
MCHC: 33.5 g/dL (ref 32.0–36.0)
MCV: 99.5 fL (ref 80.0–100.0)
MONO ABS: 444 {cells}/uL (ref 200–950)
MONOS PCT: 12 %
MPV: 10.3 fL (ref 7.5–12.5)
NEUTROS ABS: 1776 {cells}/uL (ref 1500–7800)
Neutrophils Relative %: 48 %
PLATELETS: 74 10*3/uL — AB (ref 140–400)
RBC: 4.23 MIL/uL (ref 4.20–5.80)
RDW: 14 % (ref 11.0–15.0)
WBC: 3.7 10*3/uL — ABNORMAL LOW (ref 3.8–10.8)

## 2016-02-19 NOTE — Assessment & Plan Note (Signed)
Will check end of treatment labs today.  rtc 4 months for [facility]

## 2016-02-19 NOTE — Assessment & Plan Note (Signed)
Discussed again absolute importance of stopping alcohol

## 2016-02-19 NOTE — Progress Notes (Signed)
   Subjective:    Patient ID: Peter Nelson, male    DOB: 19-Nov-1950, 65 y.o.   MRN: [id number]  HPI Here for follow up of HCV.   He is genotype 1a, elastography F3/4, Child Pugh B and liver mass with elevated AFP and confirmed with biopsy.  Now is not drinking every day and much lower intake, though still not where he needs to be. He has now completed 12 weeks of Epclusa and ribavirin, weight based.  Has inadvertently continued taking the ribaviirn left over.  Cutting down smoking too.  Had ablation, complicated by pneumothorax.  Better now with no pain.     Review of Systems  Constitutional: Negative for fatigue.  Gastrointestinal: Negative for nausea and diarrhea.  Skin: Negative for rash.  Neurological: Negative for dizziness and light-headedness.       Objective:   Physical Exam  Constitutional: He appears well-developed and well-nourished. No distress.  Eyes: No scleral icterus.  Cardiovascular: Normal rate, regular rhythm and normal heart sounds.   No murmur heard. Pulmonary/Chest: Effort normal and breath sounds normal. No respiratory distress.  Skin: No rash noted.    Social History   Social History  . Marital Status: Married    Spouse Name: [name]  . Number of Children: 48  . Years of Education: [name]   Occupational History  . Not on file.   Social History Main Topics  . Smoking status: Current Every Day Smoker -- 0.50 packs/day for 55 years    Types: Cigarettes  . Smokeless tobacco: Never Used     Comment: cutting back  . Alcohol Use: 0.0 oz/week    0 Standard drinks or equivalent per week     Comment: "a 1/2 pint a day" for more than 30 years, average 1 pint a day, cut back lately   . Drug Use: No  . Sexual Activity: Not on file   Other Topics Concern  . Not on file   Social History Narrative   Married Dec 22nd, 2016 to Peter Nelson   Has #9 children   Continues to drink but less      Assessment & Plan:

## 2016-02-19 NOTE — Assessment & Plan Note (Signed)
I appreciate assistance by Dr. Burr Medico, Dr Pascal Lux

## 2016-02-19 NOTE — Assessment & Plan Note (Signed)
Had EGD and appreciate Dr. [name] seeing him.

## 2016-02-19 NOTE — Assessment & Plan Note (Signed)
Encouraged cessation.

## 2016-02-20 LAB — HEPATITIS C RNA QUANTITATIVE: HCV Quantitative: NOT DETECTED IU/mL (ref <15)

## 2016-03-11 ENCOUNTER — Other Ambulatory Visit (HOSPITAL_COMMUNITY): Payer: Self-pay | Admitting: Interventional Radiology

## 2016-03-11 DIAGNOSIS — C220 Liver cell carcinoma: Secondary | ICD-10-CM

## 2016-03-13 ENCOUNTER — Emergency Department (HOSPITAL_COMMUNITY)
Admission: EM | Admit: 2016-03-13 | Discharge: 2016-03-13 | Disposition: A | Discharge status: Home / Self Care | Payer: No Typology Code available for payment source | Attending: Emergency Medicine | Admitting: Emergency Medicine

## 2016-03-13 ENCOUNTER — Emergency Department (HOSPITAL_COMMUNITY): Payer: No Typology Code available for payment source

## 2016-03-13 ENCOUNTER — Encounter (HOSPITAL_COMMUNITY): Payer: Self-pay

## 2016-03-13 DIAGNOSIS — J45909 Unspecified asthma, uncomplicated: Secondary | ICD-10-CM | POA: Insufficient documentation

## 2016-03-13 DIAGNOSIS — M25561 Pain in right knee: Secondary | ICD-10-CM | POA: Insufficient documentation

## 2016-03-13 DIAGNOSIS — F17210 Nicotine dependence, cigarettes, uncomplicated: Secondary | ICD-10-CM | POA: Insufficient documentation

## 2016-03-13 DIAGNOSIS — Z8505 Personal history of malignant neoplasm of liver: Secondary | ICD-10-CM | POA: Insufficient documentation

## 2016-03-13 MED ORDER — CEPHALEXIN 500 MG PO CAPS: 500.0000 mg | ORAL_CAPSULE | Freq: Two times a day (BID) | ORAL | Status: DC

## 2016-03-13 NOTE — ED Notes (Signed)
Pt. Presents with complaint of R knee pain and swelling x 1 week. Pt. States originally thought was gout but this AM noticed redness to the R knee as well and is concerned it is from a insect bite.

## 2016-03-13 NOTE — Discharge Instructions (Signed)
Please read and follow all provided instructions.  Your diagnoses today include:  1. Right knee pain    Tests performed today include:  Vital signs. See below for your results today.   Medications prescribed:   Take as prescribed   Home care instructions:  Follow any educational materials contained in this packet.  Follow-up instructions: Please follow-up with your Orthopedics for further evaluation of symptoms and treatment   Return instructions:   Please return to the Emergency Department if you do not get better, if you get worse, or new symptoms OR  - Fever (temperature greater than 101.47F)  - Bleeding that does not stop with holding pressure to the area    -Severe pain (please note that you may be more sore the day after your accident)  - Chest Pain  - Difficulty breathing  - Severe nausea or vomiting  - Inability to tolerate food and liquids  - Passing out  - Skin becoming red around your wounds  - Change in mental status (confusion or lethargy)  - New numbness or weakness     Please return if you have any other emergent concerns.  Additional Information:  Your vital signs today were: BP 160/88 mmHg   Pulse 84   Temp(Src) 98.4 F (36.9 C) (Oral)   Resp 20   SpO2 99% If your blood pressure (BP) was elevated above 135/85 this visit, please have this repeated by your doctor within one month. ---------------

## 2016-03-13 NOTE — ED Provider Notes (Signed)
CSN: [id number]     Arrival date & time 03/13/16  4481 History   First MD Initiated Contact with Patient 03/13/16 [phone]     Chief Complaint  Patient presents with  . Knee Pain   (Consider location/radiation/quality/duration/timing/severity/associated sxs/prior Treatment) HPI 65 y.o. male with a hx of Gout, Hepatitis C, presents to the Emergency Department today complaining of right knee pain x 1 week. States no aggravating factors prior to pain. No trauma/fall. Notes decrease in ROM due to pain. States that it hurts at rest as well. Pain is 10/10. Noticed redness x2 days ago on the inside of his right knee. Thinks he may have been bitten there, but unsure. No fevers. No CP/SOB/ABD pain. No numbness/tingling. Pt is able to ambulate, but with pain. OTC remedies without relief. No other symptoms noted.   Past Medical History  Diagnosis Date  . Hepatitis C   . Alcohol abuse   . Arthritis   . Asthma   . Shortness of breath dyspnea     with activity and anxiety  . Allergy   . Cancer ([place])     liver  . Wears glasses    Past Surgical History  Procedure Laterality Date  . Skin graft  1991    Left Hand   Family History  Problem Relation Age of Onset  . Hypertension Mother   . Diabetes Mother   . Heart disease Mother   . Alzheimer's disease Mother   . Diabetes Sister   . Diabetes Father   . Cancer Brother     Liver  . Colon cancer Neg Hx   . Esophageal cancer Neg Hx   . Stomach cancer Neg Hx   . Rectal cancer Neg Hx    Social History  Substance Use Topics  . Smoking status: Current Every Day Smoker -- 0.50 packs/day for 55 years    Types: Cigarettes  . Smokeless tobacco: Never Used     Comment: cutting back  . Alcohol Use: 0.0 oz/week    0 Standard drinks or equivalent per week     Comment: "a 1/2 pint a day" for more than 30 years, average 1 pint a day, cut back lately     Review of Systems ROS reviewed and all are negative for acute change except as noted in the  HPI.  Allergies  Aspirin  Home Medications   Prior to Admission medications   Medication Sig Start Date End Date Taking? Authorizing Provider  hydrocortisone 1 % ointment Apply 1 application topically 2 (two) times daily. 01/26/16   Micheline Chapman, NP  vitamin B-12 (CYANOCOBALAMIN) 1000 MCG tablet Take 1,000 mcg by mouth daily.    Historical Provider, MD   BP 160/88 mmHg  Pulse 84  Temp(Src) 98.4 F (36.9 C) (Oral)  Resp 20  SpO2 99% Physical Exam  Constitutional: He is oriented to person, place, and time. He appears well-developed and well-nourished.  HENT:  Head: Normocephalic and atraumatic.  Eyes: EOM are normal. Pupils are equal, round, and reactive to light.  Neck: Normal range of motion. Neck supple. No tracheal deviation present.  Cardiovascular: Normal rate, regular rhythm and normal heart sounds.   Pulmonary/Chest: Effort normal and breath sounds normal.  Abdominal: Soft.  Musculoskeletal:       Right knee: He exhibits decreased range of motion, swelling and erythema. He exhibits no deformity and no laceration.  Erythema with mild swelling noted on medial aspect of right knee. TTP. Warm to touch. Neurovascularly intact.  Neurological: He is alert and oriented to person, place, and time.  Skin: Skin is warm and dry.  Psychiatric: He has a normal mood and affect. His behavior is normal. Thought content normal.  Nursing note and vitals reviewed.  ED Course  Procedures (including critical care time) Labs Review Labs Reviewed - No data to display  Imaging Review Dg Knee Complete 4 Views Right  03/13/2016  CLINICAL DATA:  Right medial knee pain.  No known injury. EXAM: RIGHT KNEE - COMPLETE 4+ VIEW COMPARISON:  None. FINDINGS: Minimal posterior patellar spur formation. Small effusion. Minimal medial spur formation. Arterial calcifications. IMPRESSION: Minimal degenerative changes, small effusion and atheromatous arterial calcifications. Electronically Signed   By:  Claudie Revering M.D.   On: 03/13/2016 10:13   I have personally reviewed and evaluated these images and lab results as part of my medical decision-making.   EKG Interpretation None      MDM  I have reviewed and evaluated the relevant imaging studies.  I have reviewed the relevant previous healthcare records. I obtained HPI from historian. Patient discussed with supervising physician  ED Course:  Assessment: Pt is a 64yM with hx Hep C, Gout who presents with right knee pain x 1 week with associated redness x 2 days. On exam, pt in NAD. Nontoxic/nonseptic appearing. VSS. Afebrile. Lungs CTA. Heart RRR. Right knee with visible signs of erythema on medial aspect. Warm to touch. Mild swelling noted. Lateral aspect unremarkable. No bite marks visualized. Pt has full passive ROM. Pt able to ambulate. Discussed with attending physician and will DC with ABX, NSAIDs and follow up to PCP. Low concern for septic arthritis. XR shows mild degenerative changes with small effusion. Plan is to [place] with follow up to PCP. Given Orthopedic referral. Symptoms possibly caused by spur formation on medial aspect. Will Rx Keflex for soft tissue infection. At time of discharge, Patient is in no acute distress. Vital Signs are stable. Patient is able to ambulate. Patient able to tolerate PO.    Disposition/Plan:  DC Home Additional Verbal discharge instructions given and discussed with patient.  Pt Instructed to f/u with Ortho in the next week for evaluation and treatment of symptoms. Return precautions given Pt acknowledges and agrees with plan  Supervising Physician Lajean Saver, MD   Final diagnoses:  Right knee pain       Shary Decamp, PA-C 03/13/16 Corinne, MD 03/14/16 [phone]

## 2016-03-15 ENCOUNTER — Emergency Department (HOSPITAL_COMMUNITY): Payer: No Typology Code available for payment source

## 2016-03-15 ENCOUNTER — Observation Stay (HOSPITAL_COMMUNITY)
Admission: EM | Admit: 2016-03-15 | Discharge: 2016-03-16 | Disposition: A | Discharge status: Home / Self Care | Payer: No Typology Code available for payment source | Attending: Internal Medicine | Admitting: Internal Medicine

## 2016-03-15 ENCOUNTER — Encounter (HOSPITAL_COMMUNITY): Payer: Self-pay | Admitting: Emergency Medicine

## 2016-03-15 DIAGNOSIS — L02415 Cutaneous abscess of right lower limb: Principal | ICD-10-CM | POA: Insufficient documentation

## 2016-03-15 DIAGNOSIS — K7030 Alcoholic cirrhosis of liver without ascites: Secondary | ICD-10-CM | POA: Insufficient documentation

## 2016-03-15 DIAGNOSIS — F1010 Alcohol abuse, uncomplicated: Secondary | ICD-10-CM | POA: Insufficient documentation

## 2016-03-15 DIAGNOSIS — E876 Hypokalemia: Secondary | ICD-10-CM | POA: Insufficient documentation

## 2016-03-15 DIAGNOSIS — L03115 Cellulitis of right lower limb: Secondary | ICD-10-CM | POA: Insufficient documentation

## 2016-03-15 DIAGNOSIS — D696 Thrombocytopenia, unspecified: Secondary | ICD-10-CM | POA: Insufficient documentation

## 2016-03-15 DIAGNOSIS — K7460 Unspecified cirrhosis of liver: Secondary | ICD-10-CM | POA: Diagnosis present

## 2016-03-15 DIAGNOSIS — I70202 Unspecified atherosclerosis of native arteries of extremities, left leg: Secondary | ICD-10-CM | POA: Insufficient documentation

## 2016-03-15 DIAGNOSIS — C220 Liver cell carcinoma: Secondary | ICD-10-CM | POA: Insufficient documentation

## 2016-03-15 DIAGNOSIS — L02419 Cutaneous abscess of limb, unspecified: Secondary | ICD-10-CM

## 2016-03-15 DIAGNOSIS — M1990 Unspecified osteoarthritis, unspecified site: Secondary | ICD-10-CM | POA: Insufficient documentation

## 2016-03-15 DIAGNOSIS — B1920 Unspecified viral hepatitis C without hepatic coma: Secondary | ICD-10-CM | POA: Insufficient documentation

## 2016-03-15 DIAGNOSIS — I70291 Other atherosclerosis of native arteries of extremities, right leg: Secondary | ICD-10-CM | POA: Insufficient documentation

## 2016-03-15 DIAGNOSIS — M25461 Effusion, right knee: Secondary | ICD-10-CM | POA: Insufficient documentation

## 2016-03-15 DIAGNOSIS — J45909 Unspecified asthma, uncomplicated: Secondary | ICD-10-CM | POA: Insufficient documentation

## 2016-03-15 DIAGNOSIS — F17210 Nicotine dependence, cigarettes, uncomplicated: Secondary | ICD-10-CM | POA: Insufficient documentation

## 2016-03-15 LAB — URINALYSIS, ROUTINE W REFLEX MICROSCOPIC
Glucose, UA: NEGATIVE mg/dL
KETONES UR: NEGATIVE mg/dL
Leukocytes, UA: NEGATIVE
NITRITE: NEGATIVE
PH: 6.5 (ref 5.0–8.0)
Protein, ur: NEGATIVE mg/dL
Specific Gravity, Urine: 1.019 (ref 1.005–1.030)

## 2016-03-15 LAB — CBC WITH DIFFERENTIAL/PLATELET
BASOS ABS: 0 10*3/uL (ref 0.0–0.1)
BASOS PCT: 1 %
Eosinophils Absolute: 0.2 10*3/uL (ref 0.0–0.7)
Eosinophils Relative: 4 %
HEMATOCRIT: 36.2 % — AB (ref 39.0–52.0)
HEMOGLOBIN: 12.8 g/dL — AB (ref 13.0–17.0)
Lymphocytes Relative: 33 %
Lymphs Abs: 1.8 10*3/uL (ref 0.7–4.0)
MCH: 34.3 pg — ABNORMAL HIGH (ref 26.0–34.0)
MCHC: 35.4 g/dL (ref 30.0–36.0)
MCV: 97.1 fL (ref 78.0–100.0)
Monocytes Absolute: 0.7 10*3/uL (ref 0.1–1.0)
Monocytes Relative: 13 %
NEUTROS ABS: 2.7 10*3/uL (ref 1.7–7.7)
NEUTROS PCT: 49 %
Platelets: 85 10*3/uL — ABNORMAL LOW (ref 150–400)
RBC: 3.73 MIL/uL — AB (ref 4.22–5.81)
RDW: 14.2 % (ref 11.5–15.5)
WBC: 5.5 10*3/uL (ref 4.0–10.5)

## 2016-03-15 LAB — COMPREHENSIVE METABOLIC PANEL
ALK PHOS: 105 U/L (ref 38–126)
ALT: 41 U/L (ref 17–63)
ANION GAP: 7 (ref 5–15)
AST: 94 U/L — ABNORMAL HIGH (ref 15–41)
Albumin: 3 g/dL — ABNORMAL LOW (ref 3.5–5.0)
BILIRUBIN TOTAL: 2.5 mg/dL — AB (ref 0.3–1.2)
BUN: 11 mg/dL (ref 6–20)
CALCIUM: 8 mg/dL — AB (ref 8.9–10.3)
CO2: 25 mmol/L (ref 22–32)
Chloride: 105 mmol/L (ref 101–111)
Creatinine, Ser: 0.77 mg/dL (ref 0.61–1.24)
GFR calc non Af Amer: >60 mL/min (ref >60)
Glucose, Bld: 89 mg/dL (ref 65–99)
Potassium: 3.4 mmol/L — ABNORMAL LOW (ref 3.5–5.1)
SODIUM: 137 mmol/L (ref 135–145)
TOTAL PROTEIN: 7.9 g/dL (ref 6.5–8.1)

## 2016-03-15 LAB — URINE MICROSCOPIC-ADD ON
Bacteria, UA: NONE SEEN
Squamous Epithelial / LPF: NONE SEEN

## 2016-03-15 LAB — I-STAT CG4 LACTIC ACID, ED: Lactic Acid, Venous: 1.59 mmol/L (ref 0.5–2.0)

## 2016-03-15 MED ORDER — FOLIC ACID 1 MG PO TABS
1.0000 mg | ORAL_TABLET | Freq: Every day | ORAL | Status: DC
  Administered 2016-03-16 (×2): 1 mg via ORAL
  Filled 2016-03-15 (×2): qty 1

## 2016-03-15 MED ORDER — SODIUM CHLORIDE 0.9 % IV BOLUS (SEPSIS)
1000.0000 mL | Freq: Once | INTRAVENOUS | Status: AC
Start: 2016-03-15 — End: 2016-03-15
  Administered 2016-03-15: 1000 mL via INTRAVENOUS

## 2016-03-15 MED ORDER — MORPHINE SULFATE (PF) 4 MG/ML IV SOLN
4.0000 mg | Freq: Once | INTRAVENOUS | Status: AC
Start: 2016-03-15 — End: 2016-03-15
  Administered 2016-03-15: 4 mg via INTRAVENOUS
  Filled 2016-03-15: qty 1

## 2016-03-15 MED ORDER — HYDROCODONE-ACETAMINOPHEN 5-325 MG PO TABS
1.0000 | ORAL_TABLET | ORAL | Status: DC | PRN
  Administered 2016-03-15 – 2016-03-16 (×2): 1 via ORAL
  Filled 2016-03-15 (×2): qty 1

## 2016-03-15 MED ORDER — VANCOMYCIN HCL IN DEXTROSE 750-5 MG/150ML-% IV SOLN
750.0000 mg | Freq: Three times a day (TID) | INTRAVENOUS | Status: DC
  Administered 2016-03-16 (×2): 750 mg via INTRAVENOUS
  Filled 2016-03-15 (×2): qty 150

## 2016-03-15 MED ORDER — THIAMINE HCL 100 MG/ML IJ SOLN
100.0000 mg | Freq: Every day | INTRAMUSCULAR | Status: DC
  Filled 2016-03-15: qty 1

## 2016-03-15 MED ORDER — IBUPROFEN 200 MG PO TABS: 200.0000 mg | ORAL_TABLET | ORAL | Status: DC | PRN

## 2016-03-15 MED ORDER — VITAMIN B-1 100 MG PO TABS
100.0000 mg | ORAL_TABLET | Freq: Every day | ORAL | Status: DC
  Administered 2016-03-16 (×2): 100 mg via ORAL
  Filled 2016-03-15 (×2): qty 1

## 2016-03-15 MED ORDER — IOPAMIDOL (ISOVUE-300) INJECTION 61%
100.0000 mL | Freq: Once | INTRAVENOUS | Status: AC | PRN
  Administered 2016-03-15: 100 mL via INTRAVENOUS

## 2016-03-15 MED ORDER — VANCOMYCIN HCL IN DEXTROSE 1-5 GM/200ML-% IV SOLN
1000.0000 mg | Freq: Once | INTRAVENOUS | Status: AC
  Administered 2016-03-15: 1000 mg via INTRAVENOUS
  Filled 2016-03-15: qty 200

## 2016-03-15 MED ORDER — LIDOCAINE HCL (PF) 1 % IJ SOLN
30.0000 mL | Freq: Once | INTRAMUSCULAR | Status: AC
  Administered 2016-03-15: 30 mL
  Filled 2016-03-15: qty 30

## 2016-03-15 MED ORDER — PIPERACILLIN-TAZOBACTAM 3.375 G IVPB 30 MIN
3.3750 g | Freq: Once | INTRAVENOUS | Status: AC
Start: 2016-03-15 — End: 2016-03-15
  Administered 2016-03-15: 3.375 g via INTRAVENOUS
  Filled 2016-03-15: qty 50

## 2016-03-15 MED ORDER — DOXYCYCLINE HYCLATE 100 MG PO TABS: 100.0000 mg | ORAL_TABLET | Freq: Two times a day (BID) | ORAL | Status: DC

## 2016-03-15 MED ORDER — ADULT MULTIVITAMIN W/MINERALS CH
1.0000 | ORAL_TABLET | Freq: Every day | ORAL | Status: DC
  Administered 2016-03-16 (×2): 1 via ORAL
  Filled 2016-03-15 (×2): qty 1

## 2016-03-15 NOTE — ED Notes (Signed)
Patient transported to CT 

## 2016-03-15 NOTE — ED Provider Notes (Signed)
CSN: [id number]     Arrival date & time 03/15/16  1456 History   First MD Initiated Contact with Patient 03/15/16 1522     Chief Complaint  Patient presents with  . Open Wound     The history is provided by the patient. No language interpreter was used.   Peter Nelson is a 65 y.o. male who presents to the Emergency Department complaining of leg wound. One week ago he developed swelling and pain to the right medial knee. He experience increased pain and redness to that area and presents to the emergency department 2 days ago for evaluation and was started on antibiotics. He states that the pain and redness persisted and then this morning he had a grease spot that was draining blood and pus. His pain and redness has extended up his leg and down his leg today. He is unable to put weight on the right leg and has pain with moving the knee. He denies any fevers, vomiting, malaise, chills. He is taking his antibiotics as prescribed. Symptoms are moderate to severe, constant, worsening.  Past Medical History  Diagnosis Date  . Hepatitis C   . Alcohol abuse   . Arthritis   . Asthma   . Shortness of breath dyspnea     with activity and anxiety  . Allergy   . Cancer ([place])     liver  . Wears glasses    Past Surgical History  Procedure Laterality Date  . Skin graft  1991    Left Hand   Family History  Problem Relation Age of Onset  . Hypertension Mother   . Diabetes Mother   . Heart disease Mother   . Alzheimer's disease Mother   . Diabetes Sister   . Diabetes Father   . Cancer Brother     Liver  . Colon cancer Neg Hx   . Esophageal cancer Neg Hx   . Stomach cancer Neg Hx   . Rectal cancer Neg Hx    Social History  Substance Use Topics  . Smoking status: Current Every Day Smoker -- 0.50 packs/day for 55 years    Types: Cigarettes  . Smokeless tobacco: Never Used     Comment: cutting back  . Alcohol Use: 0.0 oz/week    0 Standard drinks or equivalent per week     Comment: "a  1/2 pint a day" for more than 30 years, average 1 pint a day, cut back lately     Review of Systems  All other systems reviewed and are negative.     Allergies  Aspirin  Home Medications   Prior to Admission medications   Medication Sig Start Date End Date Taking? Authorizing Provider  cephALEXin (KEFLEX) 500 MG capsule Take 1 capsule (500 mg total) by mouth 2 (two) times daily. 03/13/16   Shary Decamp, PA-C  hydrocortisone 1 % ointment Apply 1 application topically 2 (two) times daily. 01/26/16   Micheline Chapman, NP   BP 157/85 mmHg  Pulse 76  Temp(Src) 98.8 F (37.1 C) (Oral)  Resp 16  SpO2 99% Physical Exam  Constitutional: He is oriented to person, place, and time. He appears well-developed and well-nourished.  HENT:  Head: Normocephalic and atraumatic.  Cardiovascular: Normal rate and regular rhythm.   No murmur heard. Pulmonary/Chest: Effort normal and breath sounds normal. No respiratory distress.  Abdominal: Soft. There is no tenderness. There is no rebound and no guarding.  Musculoskeletal:  2+ DP pulses in bilateral lower extremities.  There is significant soft tissue swelling to the right lower extremity from just proximal to the knee to the foot. There is significant erythema and tenderness to palpation to the right knee down to the right mid shin. There is an area of focal fluctuance and spontaneous purulent drainage to the right medial knee. Decreased range of motion in the right knee secondary to pain and swelling.  Neurological: He is alert and oriented to person, place, and time.  Skin: Skin is warm and dry.  Psychiatric: He has a normal mood and affect. His behavior is normal.  Nursing note and vitals reviewed.   ED Course  .Marland KitchenIncision and Drainage Date/Time: 03/15/2016 8:42 PM Performed by: Quintella Reichert Authorized by: Quintella Reichert Consent: Verbal consent obtained. Risks and benefits: risks, benefits and alternatives were discussed Consent given by:  patient Patient understanding: patient states understanding of the procedure being performed Patient consent: the patient's understanding of the procedure matches consent given Patient identity confirmed: verbally with patient and arm band Time out: Immediately prior to procedure a "time out" was called to verify the correct patient, procedure, equipment, support staff and site/side marked as required. Type: abscess Body area: lower extremity Location details: right leg Anesthesia: local infiltration Local anesthetic: lidocaine 1% without epinephrine Anesthetic total: 3 ml Patient sedated: no Risk factor: coagulopathy Scalpel size: 11 Incision type: single straight Incision depth: subcutaneous Complexity: complex Drainage: purulent Drainage amount: moderate Wound treatment: wound left open Patient tolerance: Patient tolerated the procedure well with no immediate complications    Labs Review Labs Reviewed  AEROBIC CULTURE (SUPERFICIAL SPECIMEN) (NOT AT [facility])  CULTURE, BLOOD (ROUTINE X 2)  CULTURE, BLOOD (ROUTINE X 2)  URINE CULTURE  COMPREHENSIVE METABOLIC PANEL  CBC WITH DIFFERENTIAL/PLATELET  URINALYSIS, ROUTINE W REFLEX MICROSCOPIC (NOT AT [facility])  I-STAT CG4 LACTIC ACID, ED    Imaging Review No results found. I have personally reviewed and evaluated these images and lab results as part of my medical decision-making.   EKG Interpretation None      MDM   Final diagnoses:  Abscess of leg    Patient here for evaluation of increased pain and swelling to the right leg, recently started on Keflex for cellulitis. He does have an abscess on examination. CT scan was obtained to evaluate for deeper tissue space infection given external swelling. Presentation is not consistent with septic arthritis. CT scan was superficial abscess. Bedside I and D was performed with return of purulent material. He was treated with IV antibiotics. Plan to admit to the hospitalist service for  further management.    Quintella Reichert, MD 03/16/16 2258

## 2016-03-15 NOTE — ED Notes (Signed)
Nurse is in the room trying to start an IV

## 2016-03-15 NOTE — ED Notes (Signed)
Stuck patient x1 for cultures, unable to obtain blood

## 2016-03-15 NOTE — ED Notes (Signed)
Attempted IV x2.

## 2016-03-15 NOTE — ED Notes (Signed)
Report attempted and was told that nurse had to be called in and that the charge nurse would call when nurse arrived

## 2016-03-15 NOTE — ED Notes (Signed)
Pt provided with a urinal. Told to call when he could produce a sample. Call bell within reach.

## 2016-03-15 NOTE — Progress Notes (Signed)
Pharmacy Antibiotic Note  Peter Nelson is a 65 y.o. male admitted on 03/15/2016 with wound infection.  Pharmacy has been consulted for vancomycin dosing.    Plan: Vancomycin 724m IV q8h (Vanc 1gm given in ED) Follow renal function, labs, clinical course vanc trough at steady state  Height: 5' 6"  (167.6 cm) Weight: 140 lb (63.504 kg) IBW/kg (Calculated) : 63.8  Temp (24hrs), Avg:98.8 F (37.1 C), Min:98.8 F (37.1 C), Max:98.8 F (37.1 C)   Recent Labs Lab 03/15/16 1645 03/15/16 1703  WBC  --  5.5  CREATININE  --  0.77  LATICACIDVEN 1.59  --     Estimated Creatinine Clearance: 83.8 mL/min (by C-G formula based on Cr of 0.77).    Allergies  Allergen Reactions  . Aspirin Other (See Comments)    Does not take because of hep c    Antimicrobials this admission: 5/29 zosyn x 1 5/29 vancomycin >>  Microbiology results: 5/29 BCx: sent 5/29 UCx: sent 5/29 wound : few gram positive cocci in cluster in pairs  Thank you for allowing pharmacy to be a part of this patient's care.  EDolly RiasRPh 03/15/2016, 8:45 PM Pager [phone]

## 2016-03-15 NOTE — H&P (Signed)
History and Physical    Peter Nelson GEX:[id number] DOB: 13-Dec-1950 DOA: 03/15/2016   PCP: Sharon Seller, NP Chief Complaint:  Chief Complaint  Patient presents with  . Open Wound    HPI: Peter Nelson is a 65 y.o. male with medical history significant of HCV, hepatocellular carcinoma stage 1 s/p ablation, cirrhosis of the liver due to h/o EtOH abuse and HCV.  Patient presents to the ED with c/o worsening RLE wound, pain, erythema, warmth.  He was seen in ED 2 days ago for cellulitis, started on keflex.  Despite taking keflex as prescribed symptoms worsened today and he developed a wound which is now draining a purulent exudate.  ED Course: Gram stain of purulent exudate is GPC, culture pending.  Given zosyn and vanc in ED.  Review of Systems: As per HPI otherwise 10 point review of systems negative.    Past Medical History  Diagnosis Date  . Hepatitis C   . Alcohol abuse   . Arthritis   . Asthma   . Shortness of breath dyspnea     with activity and anxiety  . Allergy   . Cancer ([place])     liver  . Wears glasses     Past Surgical History  Procedure Laterality Date  . Skin graft  1991    Left Hand     reports that he has been smoking Cigarettes.  He has a 27.5 pack-year smoking history. He has never used smokeless tobacco. He reports that he drinks alcohol. He reports that he does not use illicit drugs.  Allergies  Allergen Reactions  . Aspirin Other (See Comments)    Does not take because of hep c    Family History  Problem Relation Age of Onset  . Hypertension Mother   . Diabetes Mother   . Heart disease Mother   . Alzheimer's disease Mother   . Diabetes Sister   . Diabetes Father   . Cancer Brother     Liver  . Colon cancer Neg Hx   . Esophageal cancer Neg Hx   . Stomach cancer Neg Hx   . Rectal cancer Neg Hx      Prior to Admission medications   Medication Sig Start Date End Date Taking? Authorizing Provider  cephALEXin (KEFLEX) 500 MG  capsule Take 1 capsule (500 mg total) by mouth 2 (two) times daily. 03/13/16  Yes Shary Decamp, PA-C  ibuprofen (ADVIL,MOTRIN) 200 MG tablet Take 200 mg by mouth every 4 (four) hours as needed for moderate pain.   Yes Historical Provider, MD  hydrocortisone 1 % ointment Apply 1 application topically 2 (two) times daily. Patient not taking: Reported on 03/15/2016 01/26/16   Micheline Chapman, NP    Physical Exam: Filed Vitals:   03/15/16 1713 03/15/16 1730 03/15/16 1800 03/15/16 2028  BP: 166/93 163/81 149/78 149/81  Pulse: 98 92 74 88  Temp:      TempSrc:      Resp: 19 18 12 21   Height:      Weight:      SpO2: 99% 98% 96% 97%      Constitutional: NAD, calm, comfortable Eyes: PERRL, lids and conjunctivae normal ENMT: Mucous membranes are moist. Posterior pharynx clear of any exudate or lesions.Normal dentition.  Neck: normal, supple, no masses, no thyromegaly Respiratory: clear to auscultation bilaterally, no wheezing, no crackles. Normal respiratory effort. No accessory muscle use.  Cardiovascular: Regular rate and rhythm, no murmurs / rubs / gallops. No extremity edema.  2+ pedal pulses. No carotid bruits.  Abdomen: no tenderness, no masses palpated. No hepatosplenomegaly. Bowel sounds positive.  Musculoskeletal: no clubbing / cyanosis. No joint deformity upper and lower extremities. Good ROM, no contractures. Normal muscle tone.  Skin: Area of fluctuance, abscess to RLE just below knee, purulent exudate.  Surrounding cellulitis. Neurologic: CN 2-12 grossly intact. Sensation intact, DTR normal. Strength 5/5 in all 4.  Psychiatric: Normal judgment and insight. Alert and oriented x 3. Normal mood.    Labs on Admission: I have personally reviewed following labs and imaging studies  CBC:  Recent Labs Lab 03/15/16 1703  WBC 5.5  NEUTROABS 2.7  HGB 12.8*  HCT 36.2*  MCV 97.1  PLT 85*   Basic Metabolic Panel:  Recent Labs Lab 03/15/16 1703  NA 137  K 3.4*  CL 105  CO2 25    GLUCOSE 89  BUN 11  CREATININE 0.77  CALCIUM 8.0*   GFR: Estimated Creatinine Clearance: 83.8 mL/min (by C-G formula based on Cr of 0.77). Liver Function Tests:  Recent Labs Lab 03/15/16 1703  AST 94*  ALT 41  ALKPHOS 105  BILITOT 2.5*  PROT 7.9  ALBUMIN 3.0*   No results for input(s): LIPASE, AMYLASE in the last 168 hours. No results for input(s): AMMONIA in the last 168 hours. Coagulation Profile: No results for input(s): INR, PROTIME in the last 168 hours. Cardiac Enzymes: No results for input(s): CKTOTAL, CKMB, CKMBINDEX, TROPONINI in the last 168 hours. BNP (last 3 results) No results for input(s): PROBNP in the last 8760 hours. HbA1C: No results for input(s): HGBA1C in the last 72 hours. CBG: No results for input(s): GLUCAP in the last 168 hours. Lipid Profile: No results for input(s): CHOL, HDL, LDLCALC, TRIG, CHOLHDL, LDLDIRECT in the last 72 hours. Thyroid Function Tests: No results for input(s): TSH, T4TOTAL, FREET4, T3FREE, THYROIDAB in the last 72 hours. Anemia Panel: No results for input(s): VITAMINB12, FOLATE, FERRITIN, TIBC, IRON, RETICCTPCT in the last 72 hours. Urine analysis:    Component Value Date/Time   COLORURINE AMBER* 03/15/2016 [place] 03/15/2016 1740   LABSPEC 1.019 03/15/2016 1740   PHURINE 6.5 03/15/2016 1740   GLUCOSEU NEGATIVE 03/15/2016 1740   HGBUR SMALL* 03/15/2016 1740   BILIRUBINUR SMALL* 03/15/2016 1740   KETONESUR NEGATIVE 03/15/2016 1740   PROTEINUR NEGATIVE 03/15/2016 1740   UROBILINOGEN 2.0* 08/21/2015 1221   NITRITE NEGATIVE 03/15/2016 1740   LEUKOCYTESUR NEGATIVE 03/15/2016 1740   Sepsis Labs: @LABRCNTIP (procalcitonin:4,lacticidven:4) ) Recent Results (from the past 240 hour(s))  Aerobic Culture (superficial specimen) (NOT AT [facility])     Status: None (Preliminary result)   Collection Time: 03/15/16  4:15 PM  Result Value Ref Range Status   Specimen Description WOUND RIGHT KNEE  Final   Special  Requests NONE  Final   Gram Stain   Final    RARE WBC PRESENT,BOTH PMN AND MONONUCLEAR FEW GRAM POSITIVE COCCI IN CLUSTERS IN PAIRS Gram Stain Report Called to,Read Back By and Verified WithArnold Long RN [phone] 03/15/16 A BROWNING Performed at [facility]    Culture PENDING  Incomplete   Report Status PENDING  Incomplete  Blood Culture (routine x 2)     Status: None (Preliminary result)   Collection Time: 03/15/16  5:00 PM  Result Value Ref Range Status   Specimen Description BLOOD LEFT FOREARM  Final   Special Requests BOTTLES DRAWN AEROBIC AND ANAEROBIC 7 CC  Final   Culture PENDING  Incomplete   Report Status  PENDING  Incomplete     Radiological Exams on Admission: Ct Extrem Lower W Cm Bil  03/15/2016  CLINICAL DATA:  Worsening right medial knee wound, with sanguinous drainage. May have been bitten in anterior right lower leg, with erythema. Right ankle edema. Initial encounter. EXAM: CT OF THE LOWER BILATERAL EXTREMITY WITH CONTRAST TECHNIQUE: Multidetector CT imaging of the bilateral lower extremities was performed according to the standard protocol following intravenous contrast administration. COMPARISON:  Right knee radiographs performed 03/13/2016 CONTRAST:  161m ISOVUE-300 IOPAMIDOL (ISOVUE-300) INJECTION 61% FINDINGS: Right lower extremity: There is an mildly loculated relatively superficial fluid collection just superior to the focal masslike protrusion at the anterior right lower leg, measuring approximately 3.8 x 3.6 x 0.7 cm, with minimal peripheral enhancement, likely reflecting evolving abscess. Associated diffuse soft tissue edema is seen tracking about the medial aspect of the right lower thigh and knee, with mild more diffuse edema tracking along the anterior, medial and lateral aspects of the right lower leg. The underlying vasculature appears grossly intact. Scattered calcification is noted along the superficial femoral artery and popliteal artery, and along the  branches of the popliteal artery. Runoff to the right ankle is not well assessed given the phase of contrast enhancement. There is no evidence of osseous erosion. A small knee joint effusion is noted. Soft tissue edema extends to the level of the fascia, but the underlying musculature appears grossly intact. Right inguinal nodes remain normal in size. Left lower extremity: Scattered calcification is seen along the left superficial femoral artery and profunda femoris artery, and along the left popliteal artery and its branches. Runoff is not well assessed given the phase of contrast enhancement. The left lower extremity is otherwise unremarkable. The musculature is unremarkable in appearance. No significant knee joint effusion is seen. No acute osseous abnormalities are identified. Left inguinal nodes remain normal in size. The bladder is mildly distended and grossly unremarkable. The prostate remains normal in size, with minimal calcification. IMPRESSION: 1. Mildly loculated relatively superficial fluid collection just superior to the focal masslike protrusion at the anterior right lower leg, measuring approximately 3.8 x 3.6 x 0.7 cm, with minimal peripheral enhancement, likely reflecting evolving abscess. 2. Associated diffuse soft tissue edema tracks along the medial aspect of the right lower thigh and knee, with mild more diffuse edema tracking along the anterior, medial and lateral aspects of the right lower leg. The underlying musculature appears grossly intact. 3. No evidence of osseous erosion. 4. Small right knee joint effusion noted. 5. Scattered calcification along the superficial femoral arteries bilaterally, along the popliteal arteries and along their branches. Electronically Signed   By: JGarald BaldingM.D.   On: 03/15/2016 19:12    EKG: Independently reviewed.  Assessment/Plan Principal Problem:   Abscess of right lower leg Active Problems:   Hepatocellular carcinoma (HCC)   Abscess of  RLE -  I+D done in ED  Will do Vanc for now as with GPC on gram stain, failed keflex, this statistically is likely to be MRSA, also patient is developing questionable fever in ED, oral temp is 99.5 while I am in room.  norco prn pain  HCC - chronic and stable, s/p RFA  HCV - being treated by ID, last viral load was undetectable done earlier this month.  EtOH abuse - keep an eye out for withdrawal symptoms, will put on CIWA, holding off on ordering PRN ativan for now due to no symptoms, order if symptoms develop   DVT prophylaxis: SCDs only due  to chronic thrombocytopenia Code Status: Full Family Communication: No family in room Consults called: None Admission status: Admit to obs   Etta Quill DO [facility] Pager [phone] from 7PM-7AM  If 7AM-7PM, please contact the day physician for the patient [email or web address] Password [facility]  03/15/2016, 8:35 PM

## 2016-03-15 NOTE — ED Notes (Signed)
Pt c/o right medial knee wound onset 1 week ago, seen at [facility] Saturday and given antibiotic, wound has progressively worsened since then and pt c/o new onset ankle edema. Pt c/o right medial knee wound onset today, spontaneously formed and opened without patient scratching it. Quarter-sized round elevated mass with thick sanguinous drainage present to right medial knee. Pt reports 1 week ago two insect-bite-like wheals formed on anterior lower leg, followed by erythema. Pt seen at [facility] Saturday and given antibiotic, but condition has worsened and progressed to significant right ankle edema. No fevers, chills, diaphoresis, n/v.

## 2016-03-16 DIAGNOSIS — L02415 Cutaneous abscess of right lower limb: Secondary | ICD-10-CM

## 2016-03-16 DIAGNOSIS — K7460 Unspecified cirrhosis of liver: Secondary | ICD-10-CM

## 2016-03-16 DIAGNOSIS — C220 Liver cell carcinoma: Secondary | ICD-10-CM

## 2016-03-16 DIAGNOSIS — K7030 Alcoholic cirrhosis of liver without ascites: Secondary | ICD-10-CM

## 2016-03-16 DIAGNOSIS — F1010 Alcohol abuse, uncomplicated: Secondary | ICD-10-CM

## 2016-03-16 MED ORDER — CEPHALEXIN 500 MG PO CAPS: 500.0000 mg | ORAL_CAPSULE | Freq: Two times a day (BID) | ORAL | Status: DC

## 2016-03-16 MED ORDER — DOXYCYCLINE HYCLATE 100 MG PO TABS: 100.0000 mg | ORAL_TABLET | Freq: Two times a day (BID) | ORAL | Status: DC

## 2016-03-16 MED ORDER — POTASSIUM CHLORIDE CRYS ER 20 MEQ PO TBCR: 40.0000 meq | EXTENDED_RELEASE_TABLET | Freq: Once | ORAL | Status: DC

## 2016-03-16 MED FILL — CEPHALEXIN 500 MG CAPSULE: 500 | 10 days supply | Qty: 21 | Fill #0

## 2016-03-16 NOTE — Progress Notes (Signed)
Pt discharged from the unit via wheelchair. Discharge instructions were reviewed with the pt. Belongings sent home with pt. No questions or concerns at this time.  Pernell [name] W Tomoya Ringwald, RN

## 2016-03-16 NOTE — Progress Notes (Signed)
Date:  Mar 16, 2016 Chart reviewed for concurrent status and case management needs. Will continue to follow the patient for changes and needs:  Discharged to home with no needs Expected discharge date: [id number] Peter Nelson, [name], [name], [name]

## 2016-03-16 NOTE — Discharge Summary (Signed)
Physician Discharge Summary  Peter Nelson OEU:[id number] DOB: November 19, 1950 DOA: 03/15/2016  PCP: Sharon Seller, NP  Admit date: 03/15/2016 Discharge date: 03/16/2016  Time spent: 40 minutes  Recommendations for Outpatient Follow-up:  1. Follow-up with primary care physician within one week.   Discharge Diagnoses:  Principal Problem:   Abscess of right lower leg Active Problems:   Alcohol abuse   Hepatic cirrhosis (HCC)   Alcoholic cirrhosis of liver without ascites ([place])   Hepatocellular carcinoma ([place])   Discharge Condition: Stable  Diet recommendation: Heart healthy  Filed Weights   03/15/16 1703  Weight: 63.504 kg (140 lb)    History of present illness:  Peter Nelson is a 65 y.o. male with medical history significant of HCV, hepatocellular carcinoma stage 1 s/p ablation, cirrhosis of the liver due to h/o EtOH abuse and HCV. Patient presents to the ED with c/o worsening RLE wound, pain, erythema, warmth. He was seen in ED 2 days ago for cellulitis, started on keflex. Despite taking keflex as prescribed symptoms worsened today and he developed a wound which is now draining a purulent exudate.  ED Course: Gram stain of purulent exudate is GPC, culture pending. Given zosyn and vanc in ED.  Hospital Course:   Abscess of RLE Presented with abscess/cellulitis to the right lower extremity. Status post I&D done in the emergency department, Gram stain showed GPC's. Patient was recently on Keflex, likely this is MRSA. Does not have fever or chills, does not seem to be septic, discharged on doxycycline and Keflex for 7 more days.  [place] - chronic and stable, s/p RFA  HCV - being treated by ID, last viral load was undetectable done earlier this month.  EtOH abuse - did not develop any withdrawal symptoms in the emergency department or in the hospital  Hypokalemia - potassium of 3.4 repleted with oral supplements.  Procedures:  I&D in the for right leg  abscess  Consultations:  None  Discharge Exam: Filed Vitals:   03/16/16 0230 03/16/16 0645  BP: 138/63 152/75  Pulse: 71 66  Temp: 99 F (37.2 C) 98.2 F (36.8 C)  Resp: 20 18  General: Alert and awake, oriented x3, not in any acute distress. HEENT: anicteric sclera, pupils reactive to light and accommodation, EOMI CVS: S1-S2 clear, no murmur rubs or gallops Chest: clear to auscultation bilaterally, no wheezing, rales or rhonchi Abdomen: soft nontender, nondistended, normal bowel sounds, no organomegaly Extremities: no cyanosis, clubbing or edema noted bilaterally Neuro: Cranial nerves II-XII intact, no focal neurological deficits  Discharge Instructions   Discharge Instructions    Diet - low sodium heart healthy    Complete by:  As directed      Increase activity slowly    Complete by:  As directed           Current Discharge Medication List    START taking these medications   Details  doxycycline (VIBRA-TABS) 100 MG tablet Take 1 tablet (100 mg total) by mouth 2 (two) times daily. Qty: 14 tablet, Refills: 0      CONTINUE these medications which have CHANGED   Details  cephALEXin (KEFLEX) 500 MG capsule Take 1 capsule (500 mg total) by mouth 2 (two) times daily. Qty: 21 capsule, Refills: 0      CONTINUE these medications which have NOT CHANGED   Details  ibuprofen (ADVIL,MOTRIN) 200 MG tablet Take 200 mg by mouth every 4 (four) hours as needed for moderate pain.    hydrocortisone 1 % ointment Apply  1 application topically 2 (two) times daily. Qty: 1 g, Refills: 1   Associated Diagnoses: Eczema       Allergies  Allergen Reactions  . Aspirin Other (See Comments)    Does not take because of hep c   Follow-up Information    Follow up with BERNHARDT, LINDA, NP In 1 week.   Specialty:  Family Medicine   Contact information:   [place]. [place] [address] [phone]        The results of significant diagnostics from this  hospitalization (including imaging, microbiology, ancillary and laboratory) are listed below for reference.    Significant Diagnostic Studies: Dg Knee Complete 4 Views Right  03/13/2016  CLINICAL DATA:  Right medial knee pain.  No known injury. EXAM: RIGHT KNEE - COMPLETE 4+ VIEW COMPARISON:  None. FINDINGS: Minimal posterior patellar spur formation. Small effusion. Minimal medial spur formation. Arterial calcifications. IMPRESSION: Minimal degenerative changes, small effusion and atheromatous arterial calcifications. Electronically Signed   By: Claudie Revering M.D.   On: 03/13/2016 10:13   Ct Extrem Lower W Cm Bil  03/15/2016  CLINICAL DATA:  Worsening right medial knee wound, with sanguinous drainage. May have been bitten in anterior right lower leg, with erythema. Right ankle edema. Initial encounter. EXAM: CT OF THE LOWER BILATERAL EXTREMITY WITH CONTRAST TECHNIQUE: Multidetector CT imaging of the bilateral lower extremities was performed according to the standard protocol following intravenous contrast administration. COMPARISON:  Right knee radiographs performed 03/13/2016 CONTRAST:  136m ISOVUE-300 IOPAMIDOL (ISOVUE-300) INJECTION 61% FINDINGS: Right lower extremity: There is an mildly loculated relatively superficial fluid collection just superior to the focal masslike protrusion at the anterior right lower leg, measuring approximately 3.8 x 3.6 x 0.7 cm, with minimal peripheral enhancement, likely reflecting evolving abscess. Associated diffuse soft tissue edema is seen tracking about the medial aspect of the right lower thigh and knee, with mild more diffuse edema tracking along the anterior, medial and lateral aspects of the right lower leg. The underlying vasculature appears grossly intact. Scattered calcification is noted along the superficial femoral artery and popliteal artery, and along the branches of the popliteal artery. Runoff to the right ankle is not well assessed given the phase of  contrast enhancement. There is no evidence of osseous erosion. A small knee joint effusion is noted. Soft tissue edema extends to the level of the fascia, but the underlying musculature appears grossly intact. Right inguinal nodes remain normal in size. Left lower extremity: Scattered calcification is seen along the left superficial femoral artery and profunda femoris artery, and along the left popliteal artery and its branches. Runoff is not well assessed given the phase of contrast enhancement. The left lower extremity is otherwise unremarkable. The musculature is unremarkable in appearance. No significant knee joint effusion is seen. No acute osseous abnormalities are identified. Left inguinal nodes remain normal in size. The bladder is mildly distended and grossly unremarkable. The prostate remains normal in size, with minimal calcification. IMPRESSION: 1. Mildly loculated relatively superficial fluid collection just superior to the focal masslike protrusion at the anterior right lower leg, measuring approximately 3.8 x 3.6 x 0.7 cm, with minimal peripheral enhancement, likely reflecting evolving abscess. 2. Associated diffuse soft tissue edema tracks along the medial aspect of the right lower thigh and knee, with mild more diffuse edema tracking along the anterior, medial and lateral aspects of the right lower leg. The underlying musculature appears grossly intact. 3. No evidence of osseous erosion. 4. Small right knee joint effusion noted.  5. Scattered calcification along the superficial femoral arteries bilaterally, along the popliteal arteries and along their branches. Electronically Signed   By: Garald Balding M.D.   On: 03/15/2016 19:12    Microbiology: Recent Results (from the past 240 hour(s))  Aerobic Culture (superficial specimen) (NOT AT [facility])     Status: None (Preliminary result)   Collection Time: 03/15/16  4:15 PM  Result Value Ref Range Status   Specimen Description WOUND RIGHT KNEE   Final   Special Requests NONE  Final   Gram Stain   Final    RARE WBC PRESENT,BOTH PMN AND MONONUCLEAR FEW GRAM POSITIVE COCCI IN CLUSTERS IN PAIRS Gram Stain Report Called to,Read Back By and Verified WithArnold Long RN [phone] 03/15/16 A BROWNING Performed at [facility]    Culture PENDING  Incomplete   Report Status PENDING  Incomplete  Blood Culture (routine x 2)     Status: None (Preliminary result)   Collection Time: 03/15/16  5:00 PM  Result Value Ref Range Status   Specimen Description BLOOD LEFT FOREARM  Final   Special Requests BOTTLES DRAWN AEROBIC AND ANAEROBIC 7 CC  Final   Culture PENDING  Incomplete   Report Status PENDING  Incomplete     Labs: Basic Metabolic Panel:  Recent Labs Lab 03/15/16 1703  NA 137  K 3.4*  CL 105  CO2 25  GLUCOSE 89  BUN 11  CREATININE 0.77  CALCIUM 8.0*   Liver Function Tests:  Recent Labs Lab 03/15/16 1703  AST 94*  ALT 41  ALKPHOS 105  BILITOT 2.5*  PROT 7.9  ALBUMIN 3.0*   No results for input(s): LIPASE, AMYLASE in the last 168 hours. No results for input(s): AMMONIA in the last 168 hours. CBC:  Recent Labs Lab 03/15/16 1703  WBC 5.5  NEUTROABS 2.7  HGB 12.8*  HCT 36.2*  MCV 97.1  PLT 85*   Cardiac Enzymes: No results for input(s): CKTOTAL, CKMB, CKMBINDEX, TROPONINI in the last 168 hours. BNP: BNP (last 3 results) No results for input(s): BNP in the last 8760 hours.  ProBNP (last 3 results) No results for input(s): PROBNP in the last 8760 hours.  CBG: No results for input(s): GLUCAP in the last 168 hours.     Signed:  Birdie Hopes MD.  [facility] 03/16/2016, 10:19 AM

## 2016-03-17 LAB — URINE CULTURE: CULTURE: NO GROWTH

## 2016-03-17 LAB — AEROBIC CULTURE  (SUPERFICIAL SPECIMEN)

## 2016-03-17 LAB — AEROBIC CULTURE W GRAM STAIN (SUPERFICIAL SPECIMEN)

## 2016-03-18 ENCOUNTER — Encounter: Payer: Self-pay | Admitting: Family Medicine

## 2016-03-18 ENCOUNTER — Encounter: Payer: Self-pay | Admitting: Radiology

## 2016-03-18 ENCOUNTER — Other Ambulatory Visit: Payer: Self-pay | Admitting: *Deleted

## 2016-03-20 LAB — CULTURE, BLOOD (ROUTINE X 2)
CULTURE: NO GROWTH
CULTURE: NO GROWTH

## 2016-03-30 ENCOUNTER — Telehealth: Payer: Self-pay | Admitting: Hematology

## 2016-03-30 ENCOUNTER — Encounter: Payer: Medicaid Other | Admitting: Hematology

## 2016-03-30 ENCOUNTER — Other Ambulatory Visit: Payer: Medicaid Other

## 2016-03-30 ENCOUNTER — Encounter: Payer: Self-pay | Admitting: Hematology

## 2016-03-30 NOTE — Progress Notes (Signed)
No show today   This encounter was created in error - please disregard.

## 2016-03-30 NOTE — Telephone Encounter (Signed)
cld pt and left a message to call and r/s appt only if will to come per Dr Burr Medico

## 2016-04-13 ENCOUNTER — Ambulatory Visit (HOSPITAL_COMMUNITY): Admission: RE | Admit: 2016-04-13 | Payer: MEDICAID | Source: Ambulatory Visit

## 2016-04-13 ENCOUNTER — Inpatient Hospital Stay: Admission: RE | Admit: 2016-04-13 | Payer: Self-pay | Source: Ambulatory Visit

## 2016-04-22 ENCOUNTER — Encounter: Payer: Self-pay | Admitting: Radiology

## 2016-04-22 ENCOUNTER — Other Ambulatory Visit: Payer: Self-pay | Admitting: Radiology

## 2016-04-22 DIAGNOSIS — C220 Liver cell carcinoma: Secondary | ICD-10-CM

## 2016-05-12 LAB — COMPREHENSIVE METABOLIC PANEL
ALBUMIN: 3.1 g/dL — AB (ref 3.6–5.1)
ALK PHOS: 109 U/L (ref 40–115)
ALT: 48 U/L — AB (ref 9–46)
AST: 123 U/L — ABNORMAL HIGH (ref 10–35)
BILIRUBIN TOTAL: 4.1 mg/dL — AB (ref 0.2–1.2)
BUN: 12 mg/dL (ref 7–25)
CHLORIDE: 105 mmol/L (ref 98–110)
CO2: 24 mmol/L (ref 20–31)
CREATININE: 0.86 mg/dL (ref 0.70–1.25)
Calcium: 8.1 mg/dL — ABNORMAL LOW (ref 8.6–10.3)
Glucose, Bld: 84 mg/dL (ref 65–99)
Potassium: 3.4 mmol/L — ABNORMAL LOW (ref 3.5–5.3)
SODIUM: 139 mmol/L (ref 135–146)
TOTAL PROTEIN: 7.7 g/dL (ref 6.1–8.1)

## 2016-05-12 LAB — AFP TUMOR MARKER: AFP-Tumor Marker: 5.3 ng/mL

## 2016-05-27 ENCOUNTER — Ambulatory Visit (HOSPITAL_COMMUNITY): Admission: RE | Admit: 2016-05-27 | Payer: Medicare Other | Source: Ambulatory Visit

## 2016-05-27 ENCOUNTER — Inpatient Hospital Stay: Admission: RE | Admit: 2016-05-27 | Payer: Self-pay | Source: Ambulatory Visit

## 2016-05-31 ENCOUNTER — Telehealth: Payer: Self-pay | Admitting: *Deleted

## 2016-05-31 NOTE — Telephone Encounter (Signed)
I have spoken to Peter Nelson (as requested by patient) to advise of appointment with Dr [name] scheduled for 08/10/16 @ 3 pm. Peter Nelson verbalizes understanding and I have also sent a note to home address with appointment information at [place] request.

## 2016-05-31 NOTE — Telephone Encounter (Signed)
-----   Message from Jerene Bears, MD sent at 05/31/2016  9:19 AM EDT ----- Next available  ----- Message ----- From: Larina Bras, CMA Sent: 05/31/2016   9:09 AM To: Jerene Bears, MD  Next available or work in with extender? ----- Message ----- From: Jerene Bears, MD Sent: 05/31/2016   8:32 AM To: Larina Bras, CMA  This guy needs OV for followup Has been no show after IR treatment of liver lesion We may not be successful, but need to try JMP  ----- Message ----- From: Truitt Merle, MD Sent: 05/27/2016   4:39 PM To: Marcelyn Bruins, Sandi Mariscal, MD, #  Thanks for letting me know.   Manuela Schwartz, please keep him on your follow up list to see if he is willing to come back to see [place].   Thanks,  Krista Blue  ----- Message ----- From: Sandi Mariscal, MD Sent: 05/27/2016   2:57 PM To: Marcelyn Bruins, Jerene Bears, MD, #  Drs. Fen and Pyrtle,  Just wanted to let you know that after a technically successful microwave ablation of this pt's liver mass on 12/24/15, he has not followed up with me for either labs or post procedural imaging.  He has missed at least 2 follow-up appts and MRI imaging slots and per discussion with his wife, the pt continues to drink heavily all day, everyday.  As such, I am no longer going to actively try to follow-up with the pt but please feel free to send him my way in the future if he ever re-surfaces.  Cathren Harsh

## 2016-06-11 ENCOUNTER — Telehealth: Payer: Self-pay | Admitting: *Deleted

## 2016-06-11 NOTE — Telephone Encounter (Signed)
Oncology Nurse Navigator Documentation  Oncology Nurse Navigator Flowsheets 06/11/2016  Navigator Location [facility]Med [facility]  Navigator Encounter Type Telephone  Telephone Outgoing Call;Appt Confirmation/Clarification  Abnormal Finding Date -  Barriers/Navigation Needs -Has been lost to follow up with medical oncology and IR.  Education -  Interventions Other--appointment made with his significant other, Peter Nelson and mailed confirmation to their home as requested.  Acuity -  Time Spent with Patient 15  Called home and and spoke with [place]. She reports she is also concerned about him. She can bring him to an appointment but it must be on a Monday or Tuesday in late afternoon (near 4 pm) for her job. She has already missed too much work with him. She agreed to 9/26 at 3:30 pm.

## 2016-07-13 ENCOUNTER — Ambulatory Visit (HOSPITAL_BASED_OUTPATIENT_CLINIC_OR_DEPARTMENT_OTHER): Payer: Medicare Other

## 2016-07-13 ENCOUNTER — Encounter: Payer: Self-pay | Admitting: *Deleted

## 2016-07-13 ENCOUNTER — Ambulatory Visit (HOSPITAL_BASED_OUTPATIENT_CLINIC_OR_DEPARTMENT_OTHER): Payer: Medicare Other | Admitting: Hematology

## 2016-07-13 VITALS — BP 148/61 | HR 75 | Temp 98.8°F | Resp 18 | Ht 66.0 in | Wt 132.8 lb

## 2016-07-13 DIAGNOSIS — C220 Liver cell carcinoma: Secondary | ICD-10-CM

## 2016-07-13 DIAGNOSIS — Z720 Tobacco use: Secondary | ICD-10-CM

## 2016-07-13 DIAGNOSIS — K7460 Unspecified cirrhosis of liver: Secondary | ICD-10-CM

## 2016-07-13 DIAGNOSIS — B1920 Unspecified viral hepatitis C without hepatic coma: Secondary | ICD-10-CM

## 2016-07-13 DIAGNOSIS — F1010 Alcohol abuse, uncomplicated: Secondary | ICD-10-CM

## 2016-07-13 LAB — COMPREHENSIVE METABOLIC PANEL
ALT: 61 U/L — AB (ref 0–55)
ANION GAP: 10 meq/L (ref 3–11)
AST: 152 U/L — ABNORMAL HIGH (ref 5–34)
Albumin: 2.5 g/dL — ABNORMAL LOW (ref 3.5–5.0)
Alkaline Phosphatase: 132 U/L (ref 40–150)
BUN: 10.7 mg/dL (ref 7.0–26.0)
CHLORIDE: 110 meq/L — AB (ref 98–109)
CO2: 23 meq/L (ref 22–29)
CREATININE: 1 mg/dL (ref 0.7–1.3)
Calcium: 8.2 mg/dL — ABNORMAL LOW (ref 8.4–10.4)
EGFR: >90 mL/min/{1.73_m2} (ref >90)
GLUCOSE: 111 mg/dL (ref 70–140)
Potassium: 3.4 mEq/L — ABNORMAL LOW (ref 3.5–5.1)
SODIUM: 143 meq/L (ref 136–145)
TOTAL PROTEIN: 7.7 g/dL (ref 6.4–8.3)
Total Bilirubin: 2.92 mg/dL — ABNORMAL HIGH (ref 0.20–1.20)

## 2016-07-13 LAB — CBC WITH DIFFERENTIAL/PLATELET
BASO%: 1.3 % (ref 0.0–2.0)
Basophils Absolute: 0.1 10*3/uL (ref 0.0–0.1)
EOS ABS: 0.5 10*3/uL (ref 0.0–0.5)
EOS%: 10.8 % — AB (ref 0.0–7.0)
HCT: 38 % — ABNORMAL LOW (ref 38.4–49.9)
HGB: 12.5 g/dL — ABNORMAL LOW (ref 13.0–17.1)
LYMPH#: 1.9 10*3/uL (ref 0.9–3.3)
LYMPH%: 42.3 % (ref 14.0–49.0)
MCH: 34.3 pg — ABNORMAL HIGH (ref 27.2–33.4)
MCHC: 32.8 g/dL (ref 32.0–36.0)
MCV: 104.3 fL — ABNORMAL HIGH (ref 79.3–98.0)
MONO#: 0.5 10*3/uL (ref 0.1–0.9)
MONO%: 10.5 % (ref 0.0–14.0)
NEUT%: 35.1 % — ABNORMAL LOW (ref 39.0–75.0)
NEUTROS ABS: 1.6 10*3/uL (ref 1.5–6.5)
PLATELETS: 80 10*3/uL — AB (ref 140–400)
RBC: 3.64 10*6/uL — AB (ref 4.20–5.82)
RDW: 13.9 % (ref 11.0–14.6)
WBC: 4.5 10*3/uL (ref 4.0–10.3)

## 2016-07-13 LAB — PROTIME-INR
INR: 1.3 — AB (ref 2.00–3.50)
Protime: 15.6 Seconds — ABNORMAL HIGH (ref 10.6–13.4)

## 2016-07-13 NOTE — Progress Notes (Signed)
[place]  [phone]) [phone] [phone]  Clinic Follow Up Note   Patient Care Team: Micheline Chapman, NP as PCP - General (Family Medicine) Thayer Headings, MD as Consulting Physician (Infectious Diseases) Truitt Merle, MD as Consulting Physician (Hematology) 07/13/2016   CHIEF COMPLAINTS:  Follow up [facility]   Oncology History   Hepatocellular carcinoma ([place])   Staging form: Liver (Excluding Intrahepatic Bile Ducts), AJCC 7th Edition     Clinical stage from 11/26/2015: Stage I (T1, N0, M0) - Signed by Truitt Merle, MD on 12/02/2015       Hepatocellular carcinoma ([place])   11/21/2015 Imaging    liver MRI (done at [facility]) showed a fat containing mass in the right lobe of liver, measuring 2.4 x 2.3 cm      11/26/2015 Initial Diagnosis    Hepatocellular carcinoma ([place])      11/26/2015 Initial Biopsy    Right lobe liver mass biopsy showed [facility]      12/24/2015 Procedure    microwave ablation of solitary approximately 2.8 cm lesion within the cranial aspect of the anterior segment of the right lobe of the liver, by Dr. Pascal Lux       HISTORY OF PRESENTING ILLNESS (11/13/2015):  Peter Nelson 65 y.o. male with past medical history of appetite C, liver cirrhosis, is being referred by Dr.Comer for a newly discovered liver mass.    He was found to have Hep C about 3-4 years ago when he was in jail, he believes he got hepatitis C from tattoo. His hepatitis C has not been treated.   He had episode of chest pain 3-4 time since 05/2015 and  and unbalnace gates and was seen in the ED.  He had an episode of bloody diarrhea once with melano a few weeks weeks ago. He was referred to see primary care physician Dr. Smith Robert after ED visit. He was subsequently referred to infection disease specialist, for untreated hepatitis C. Passively and ultrasound of abdomen was obtained on 10/19/2015, which showed a 2.7 cm right hepatic lobe region, suspicious for hepatocellular carcinoma. He  subsequently underwent a liver MRI at a [facility] last week.  He feels well overall, has good appetie and eats small meals, no reently weight loss. He denies significant abdominal discomfort, bloating, nausea, or other symptoms. His bowel movement is normal, he denies history of GI bleeding, melena or hematochezia. He has a mild fatigue, but able to tolerate routine activities.  He has history of alcohol abuse, still drinks half pint a day, and also smoke half pack a day.  CURRENT THERAPY: Surveillance  INTERIM HISTORY: Peter Nelson returns for follow-up. He is accompanied by his wife today. He underwent microwave ablation of his liver cancer on 12/24/2015. He lost follow-up afterwards with IR Dr. Pascal Lux and [place] despite multiple phone calls. Our GI navigator recently called his wife, and she agreed to bring him back to my clinic. He is to drink alcohol heavily. He feels well overall, denies any significant pain, abdominal discomfort, nausea, hematochezia, or other symptoms. He was seen in ED in 02/2016 for leg pain and abscess.   MEDICAL HISTORY:  Past Medical History:  Diagnosis Date  . Alcohol abuse   . Allergy   . Arthritis   . Asthma   . Cancer ([place])    liver  . Hepatitis C   . Shortness of breath dyspnea    with activity and anxiety  . Wears glasses     SURGICAL HISTORY:  Past Surgical History:  Procedure Laterality Date  . SKIN GRAFT  1991   Left Hand    SOCIAL HISTORY: Social History   Social History  . Marital status: Married    Spouse name: [name]  . Number of children: 4  . Years of education: [name]   Occupational History  . Not on file.   Social History Main Topics  . Smoking status: Current Every Day Smoker    Packs/day: 0.50    Years: 55.00    Types: Cigarettes  . Smokeless tobacco: Never Used     Comment: cutting back  . Alcohol use 0.0 oz/week     Comment: "a 1/2 pint a day" for more than 30 years, average 1 pint a day, cut back lately   . Drug use: No  .  Sexual activity: Not on file   Other Topics Concern  . Not on file   Social History Narrative   Married Dec 22nd, 2016 to [place]   Has #9 children    FAMILY HISTORY: Family History  Problem Relation Age of Onset  . Hypertension Mother   . Diabetes Mother   . Heart disease Mother   . Alzheimer's disease Mother   . Diabetes Sister   . Diabetes Father   . Cancer Brother     Liver  . Colon cancer Neg Hx   . Esophageal cancer Neg Hx   . Stomach cancer Neg Hx   . Rectal cancer Neg Hx     ALLERGIES:  is allergic to aspirin.  MEDICATIONS:  Current Outpatient Prescriptions  Medication Sig Dispense Refill  . cephALEXin (KEFLEX) 500 MG capsule Take 1 capsule (500 mg total) by mouth 2 (two) times daily. 21 capsule 0  . doxycycline (VIBRA-TABS) 100 MG tablet Take 1 tablet (100 mg total) by mouth 2 (two) times daily. 14 tablet 0  . hydrocortisone 1 % ointment Apply 1 application topically 2 (two) times daily. (Patient not taking: Reported on 03/15/2016) 1 g 1  . ibuprofen (ADVIL,MOTRIN) 200 MG tablet Take 200 mg by mouth every 4 (four) hours as needed for moderate pain.     No current facility-administered medications for this visit.     REVIEW OF SYSTEMS:   Constitutional: Denies fevers, chills or abnormal night sweats Eyes: Denies blurriness of vision, double vision or watery eyes Ears, nose, mouth, throat, and face: Denies mucositis or sore throat Respiratory: Denies cough, dyspnea or wheezes Cardiovascular: Denies palpitation, chest discomfort or lower extremity swelling Gastrointestinal:  Denies nausea, heartburn or change in bowel habits Skin: Denies abnormal skin rashes Lymphatics: Denies new lymphadenopathy or easy bruising Neurological:Denies numbness, tingling or new weaknesses Behavioral/Psych: Mood is stable, no new changes  All other systems were reviewed with the patient and are negative.  PHYSICAL EXAMINATION: ECOG PERFORMANCE STATUS: 1 - Symptomatic but  completely ambulatory  Vitals:   07/13/16 1526  BP: (!) 148/61  Nelson: 75  Resp: 18  Temp: 98.8 F (37.1 C)   Filed Weights   07/13/16 1526  Weight: 132 lb 12.8 oz (60.2 kg)    GENERAL:alert, no distress and comfortable SKIN: skin color, texture, turgor are normal, no rashes or significant lesions EYES: normal, conjunctiva are pink and non-injected, sclera clear OROPHARYNX:no exudate, no erythema and lips, buccal mucosa, and tongue normal  NECK: supple, thyroid normal size, non-tender, without nodularity LYMPH:  no palpable lymphadenopathy in the cervical, axillary or inguinal LUNGS: clear to auscultation and percussion with normal breathing effort HEART: regular rate & rhythm  and no murmurs and no lower extremity edema ABDOMEN:abdomen soft, non-tender and normal bowel sounds, no organomegaly. Musculoskeletal:no cyanosis of digits and no clubbing  PSYCH: alert & oriented x 3 with fluent speech NEURO: no focal motor/sensory deficits  LABORATORY DATA:  I have reviewed the data as listed CBC Latest Ref Rng & Units 07/13/2016 03/15/2016 02/19/2016  WBC 4.0 - 10.3 10e3/uL 4.5 5.5 3.7(L)  Hemoglobin 13.0 - 17.1 g/dL 12.5(L) 12.8(L) 14.1  Hematocrit 38.4 - 49.9 % 38.0(L) 36.2(L) 42.1  Platelets 140 - 400 10e3/uL 80(L) 85(L) 74(L)    Recent Labs  12/26/15 0325 01/22/16 0841 03/15/16 1703 05/11/16 1058 07/13/16 1602  NA 132* 136 137 139 143  K 3.8 3.5 3.4* 3.4* 3.4*  CL 101 104 105 105  --   CO2 25 21 25 24 23   GLUCOSE 109* 92 89 84 111  BUN 21* 13 11 12  10.7  CREATININE 1.19 0.90 0.77 0.86 1.0  CALCIUM 7.6* 8.5* 8.0* 8.1* 8.2*  GFRNONAA >60 >89 >60  --   --   GFRAA >60 >89 >60  --   --   PROT  --  7.7 7.9 7.7 7.7  ALBUMIN  --  3.1* 3.0* 3.1* 2.5*  AST  --  70* 94* 123* 152*  ALT  --  33 41 48* 61*  ALKPHOS  --  92 105 109 132  BILITOT  --  2.7* 2.5* 4.1* 2.92*   Results for NANCY, MANUELE (MRN [id number]) as of 07/15/2016 20:15  Ref. Range 10/30/2015 10:32 11/13/2015  16:10 05/11/2016 10:58 07/13/2016 16:02  AFP Tumor Marker Latest Ref Range: <6.1 ng/mL 95.2 (H)  5.3   AFP, Serum, Tumor Marker Latest Ref Range: 0.0 - 8.3 ng/mL  101.7 (H)  4.7    RADIOGRAPHIC STUDIES: I have personally reviewed the radiological images as listed and agreed with the findings in the report. No results found.   Abdomen MRI w and wo contrast 11/06/2015 [facility] images) Impression: Fat containing mass in the liver, measuring 2.4 x 2.3 cm in the central of right lobe. Both hepatocellular carcinoma and adenomas can containing fat.   ASSESSMENT & PLAN:  65 year old African-American male, with past medical history of untreated hepatitis C, drug and alcohol abuse, liver cirrhosis, presented with a image discovered liver mass.  1. Hepatocellular carcinoma, cT1N0M0, stage I -His initial outside abdominal MRI images were reviewed in our GI . The imaging features  Are most consistent with hepatocellular carcinoma, except the lack of arterial phase enhancement. -His AFP is significantly elevated, which is also suspicious for HCC. -i discussed his liver mass biopsy results with patient and his wife which confirmed [place]. -he underwent microwave ablation by Dr. Pascal Lux in 12/2015, but lost follow-up afterwards. -I'll repeat abdominal MRI to develop his response to ablation, and ruled out recurrence or metastasis. He agrees. -We again emphasized the importance of follow-up and surveillance, he agrees to follow  2. hepatitis C, liver cirrhosis, Child Pugh class B (9) -He will continue follow-up with Dr.Comer for hepatitis C treatment -I strongly encouraged him to stop alcohol drinking  3. Substance abuse (alcohol and smoking) -he has started drinking alcohol again. I strongly encourage him to quit alcohol and smoking. He voiced good understanding, and is willing to try.  Plan  -Repeat lab today -Abdominal MRI with and without contrast in the next few weeks -Return to clinic in 4 weeks for  follow-up  All questions were answered. The patient knows to call the clinic with any problems,  questions or concerns. I spent 25 minutes counseling the patient face to face. The total time spent in the appointment was 30 minutes and more than 50% was on counseling.     Truitt Merle, MD 07/13/2016

## 2016-07-14 LAB — AFP TUMOR MARKER: AFP, Serum, Tumor Marker: 4.7 ng/mL (ref 0.0–8.3)

## 2016-07-15 ENCOUNTER — Encounter: Payer: Self-pay | Admitting: Hematology

## 2016-07-27 ENCOUNTER — Ambulatory Visit (HOSPITAL_COMMUNITY)
Admission: RE | Admit: 2016-07-27 | Discharge: 2016-07-27 | Disposition: A | Discharge status: Home / Self Care | Payer: Medicare Other | Source: Ambulatory Visit | Attending: Hematology | Admitting: Hematology

## 2016-07-27 ENCOUNTER — Ambulatory Visit (HOSPITAL_COMMUNITY): Admission: RE | Admit: 2016-07-27 | Payer: Medicare Other | Source: Ambulatory Visit | Admitting: Hematology

## 2016-07-27 DIAGNOSIS — C220 Liver cell carcinoma: Secondary | ICD-10-CM | POA: Diagnosis not present

## 2016-07-27 DIAGNOSIS — K7460 Unspecified cirrhosis of liver: Secondary | ICD-10-CM | POA: Diagnosis not present

## 2016-07-27 DIAGNOSIS — R188 Other ascites: Secondary | ICD-10-CM | POA: Insufficient documentation

## 2016-07-27 MED ORDER — GADOXETATE DISODIUM 0.25 MMOL/ML IV SOLN
6.0000 mL | Freq: Once | INTRAVENOUS | Status: AC | PRN
  Administered 2016-07-27: 6 mL via INTRAVENOUS

## 2016-08-06 ENCOUNTER — Ambulatory Visit: Payer: Self-pay | Admitting: Family Medicine

## 2016-08-09 ENCOUNTER — Ambulatory Visit: Payer: Self-pay | Admitting: Family Medicine

## 2016-08-10 ENCOUNTER — Ambulatory Visit: Payer: Medicare Other | Admitting: Internal Medicine

## 2016-08-10 ENCOUNTER — Encounter: Payer: Self-pay | Admitting: Internal Medicine

## 2016-08-10 ENCOUNTER — Other Ambulatory Visit (INDEPENDENT_AMBULATORY_CARE_PROVIDER_SITE_OTHER): Payer: Medicare Other

## 2016-08-10 ENCOUNTER — Ambulatory Visit (HOSPITAL_BASED_OUTPATIENT_CLINIC_OR_DEPARTMENT_OTHER): Payer: Medicare Other | Admitting: Hematology

## 2016-08-10 ENCOUNTER — Ambulatory Visit (INDEPENDENT_AMBULATORY_CARE_PROVIDER_SITE_OTHER): Payer: Medicare Other | Admitting: Internal Medicine

## 2016-08-10 ENCOUNTER — Encounter: Payer: Self-pay | Admitting: Hematology

## 2016-08-10 VITALS — BP 140/78 | HR 72 | Ht 66.0 in | Wt 143.0 lb

## 2016-08-10 VITALS — BP 153/79 | HR 87 | Temp 98.5°F | Resp 17 | Ht 66.0 in | Wt 143.3 lb

## 2016-08-10 DIAGNOSIS — F1010 Alcohol abuse, uncomplicated: Secondary | ICD-10-CM

## 2016-08-10 DIAGNOSIS — Z720 Tobacco use: Secondary | ICD-10-CM

## 2016-08-10 DIAGNOSIS — K7290 Hepatic failure, unspecified without coma: Secondary | ICD-10-CM | POA: Diagnosis not present

## 2016-08-10 DIAGNOSIS — K7031 Alcoholic cirrhosis of liver with ascites: Secondary | ICD-10-CM

## 2016-08-10 DIAGNOSIS — C220 Liver cell carcinoma: Secondary | ICD-10-CM

## 2016-08-10 DIAGNOSIS — R600 Localized edema: Secondary | ICD-10-CM

## 2016-08-10 DIAGNOSIS — R188 Other ascites: Secondary | ICD-10-CM

## 2016-08-10 DIAGNOSIS — B1920 Unspecified viral hepatitis C without hepatic coma: Secondary | ICD-10-CM | POA: Diagnosis not present

## 2016-08-10 DIAGNOSIS — K7460 Unspecified cirrhosis of liver: Secondary | ICD-10-CM

## 2016-08-10 LAB — CBC WITH DIFFERENTIAL/PLATELET
BASOS ABS: 0.1 10*3/uL (ref 0.0–0.1)
Basophils Relative: 1.6 % (ref 0.0–3.0)
Eosinophils Absolute: 0.2 10*3/uL (ref 0.0–0.7)
Eosinophils Relative: 5 % (ref 0.0–5.0)
HCT: 37.5 % — ABNORMAL LOW (ref 39.0–52.0)
Hemoglobin: 13 g/dL (ref 13.0–17.0)
LYMPHS ABS: 2 10*3/uL (ref 0.7–4.0)
Lymphocytes Relative: 44 % (ref 12.0–46.0)
MCHC: 34.7 g/dL (ref 30.0–36.0)
MCV: 100.8 fl — AB (ref 78.0–100.0)
MONO ABS: 0.6 10*3/uL (ref 0.1–1.0)
MONOS PCT: 13.8 % — AB (ref 3.0–12.0)
NEUTROS PCT: 35.6 % — AB (ref 43.0–77.0)
Neutro Abs: 1.6 10*3/uL (ref 1.4–7.7)
Platelets: 78 10*3/uL — ABNORMAL LOW (ref 150.0–400.0)
RBC: 3.72 Mil/uL — AB (ref 4.22–5.81)
RDW: 14.6 % (ref 11.5–15.5)
WBC: 4.4 10*3/uL (ref 4.0–10.5)

## 2016-08-10 LAB — COMPREHENSIVE METABOLIC PANEL
ALK PHOS: 107 U/L (ref 39–117)
ALT: 41 U/L (ref 0–53)
AST: 106 U/L — ABNORMAL HIGH (ref 0–37)
Albumin: 3 g/dL — ABNORMAL LOW (ref 3.5–5.2)
BILIRUBIN TOTAL: 3.1 mg/dL — AB (ref 0.2–1.2)
BUN: 10 mg/dL (ref 6–23)
CO2: 28 mEq/L (ref 19–32)
CREATININE: 0.91 mg/dL (ref 0.40–1.50)
Calcium: 8.3 mg/dL — ABNORMAL LOW (ref 8.4–10.5)
Chloride: 106 mEq/L (ref 96–112)
GFR: 107.43 mL/min (ref >60.00)
GLUCOSE: 77 mg/dL (ref 70–99)
Potassium: 3.4 mEq/L — ABNORMAL LOW (ref 3.5–5.1)
SODIUM: 140 meq/L (ref 135–145)
TOTAL PROTEIN: 7.8 g/dL (ref 6.0–8.3)

## 2016-08-10 LAB — PROTIME-INR
INR: 1.3 ratio — ABNORMAL HIGH (ref 0.8–1.0)
Prothrombin Time: 14.2 s — ABNORMAL HIGH (ref 9.6–13.1)

## 2016-08-10 MED ORDER — SPIRONOLACTONE 100 MG PO TABS: 100.0000 mg | ORAL_TABLET | Freq: Every day | ORAL | 1 refills | Status: DC

## 2016-08-10 MED ORDER — FUROSEMIDE 40 MG PO TABS: 40.0000 mg | ORAL_TABLET | Freq: Every day | ORAL | 1 refills | Status: DC

## 2016-08-10 MED FILL — spIRONOLACTONE 100 MG TAB: 100 | 30 days supply | Qty: 30 | Fill #0

## 2016-08-10 MED FILL — FUROSEMIDE 40 MG TABLET: 40 | 30 days supply | Qty: 30 | Fill #0

## 2016-08-10 NOTE — Progress Notes (Signed)
Subjective:    Patient ID: Peter Nelson, male    DOB: Dec 21, 1950, 65 y.o.   MRN: [id number]  HPI Peter Nelson is a 65 year old male with a past medical history of alcohol and hepatitis C cirrhosis complicated by hepatocellular carcinoma who is here for follow-up. He was initially seen in January by Nicoletta Ba, PA-C and came for upper endoscopy for variceal screening along with colonoscopy. He was then lost to follow-up. He did have microwave ablation of a 2.8 cm HCC are formed by Dr. Pascal Lux with IR in March 2017. He has very recently reconnected with oncology and followed up with Dr. Burr Medico today.  Recently he has struggled with increasing abdominal swelling and lower extremity swelling. His lower extremities are painful due to the swelling. He estimates he's gained about 15 pounds. He reports he has been drinking alcohol about a half pint of gin daily. He also reports he does not follow any particular type of diet. He has felt fatigue recently. He denies seeing blood in his stool or melena. Denies nausea and vomiting. Denies dysphagia. Reports bowel movements is regular. He also continues to smoke tobacco daily.  He is following with Dr. Linus Salmons with ID for Hep C.   Review of Systems As per history of present illness, otherwise negative  Current Medications, Allergies, Past Medical History, Past Surgical History, Family History and Social History were reviewed in [employer] record.     Objective:   Physical Exam BP 140/78   Pulse 72   Ht 5' 6"  (1.676 m)   Wt 143 lb (64.9 kg)   BMI 23.08 kg/m  Constitutional: Well-developed and Chronically ill-appearing male. No distress. HEENT: Normocephalic and atraumatic. Oropharynx is clear and moist. No oropharyngeal exudate. Conjunctivae are normal.  Sclera are muddy Neck: Neck supple. Trachea midline. Cardiovascular: Normal rate, regular rhythm and intact distal pulses. No M/R/G Pulmonary/chest: Effort normal and breath  sounds normal. No wheezing, rales or rhonchi. Abdominal: Soft, nontender, some distention but not tense with small umbilical hernia which is nontender. Bowel sounds active throughout.  Extremities: no clubbing, cyanosis, 2-3+ lower extremity edema which is tender to touch Lymphadenopathy: No cervical adenopathy noted. Neurological: Alert and oriented to person place and time. Skin: Skin is warm and dry. No rashes noted. No asterixis Psychiatric: Normal mood and affect. Behavior is normal.   CLINICAL DATA:  65 year old male with history of hepatitis-C status post microwave ablation of a solitary lesion in the right lobe of the liver. Followup study.   EXAM: MRI ABDOMEN WITHOUT AND WITH CONTRAST   TECHNIQUE: Multiplanar multisequence MR imaging of the abdomen was performed both before and after the administration of intravenous contrast.   CONTRAST:  6 mL of Eovist.   COMPARISON:  CT the abdomen and pelvis 12/08/2015. Outside abdominal MRI 11/06/2015.   FINDINGS: Lower chest: Unremarkable.   Hepatobiliary: In the superior aspect of the right lobe of the liver, predominantly in segment 8, there is a well-defined area measuring 3.8 x 2.7 x 2.9 cm (axial image 28 of series 503 and coronal image 52 of series 6) which corresponds to the treated lesion. The area of treatment is heterogeneously T1 hyperintense, predominantly T2 hypointense, and demonstrates no significant internal enhancement on post gadolinium imaging to suggest residual/recurrent disease. No other new hepatic lesions are identified. The liver has a nodular contour, compatible with underlying cirrhosis. No intra or extrahepatic biliary ductal dilatation. Gallbladder is normal in appearance.   Pancreas: No pancreatic mass. No pancreatic  ductal dilatation. No focal pancreatic or peripancreatic fluid collections.   Spleen:  Unremarkable.   Adrenals/Urinary Tract: Bilateral adrenal glands and bilateral kidneys are  normal in appearance. There is no hydroureteronephrosis in the visualized abdomen.   Stomach/Bowel: Visualized portions are unremarkable.   Vascular/Lymphatic: No aneurysm identified in the visualized abdominal vasculature. Portal vein is mildly dilated measuring 15 mm in the porta hepatis. No lymphadenopathy noted in the abdomen.   Other:  Large volume of ascites.   Musculoskeletal: No aggressive osseous lesions are noted in the visualized portions of the skeleton.   IMPRESSION: 1. Post treatment related changes from prior microwave ablation to lesion in the superior aspect of the right lobe of the liver (predominantly in segment 8). No evidence to suggest residual/recurrent disease, or definite metastatic disease on today's examination. 2. Interval development of large volume of ascites. 3. Cirrhosis.     Electronically Signed   By: Vinnie Langton M.D.   On: 07/27/2016 11:27    CBC    Component Value Date/Time   WBC 4.4 08/10/2016 1610   RBC 3.72 (L) 08/10/2016 1610   HGB 13.0 08/10/2016 1610   HGB 12.5 (L) 07/13/2016 1602   HCT 37.5 (L) 08/10/2016 1610   HCT 38.0 (L) 07/13/2016 1602   PLT 78.0 (L) 08/10/2016 1610   PLT 80 (L) 07/13/2016 1602   MCV 100.8 (H) 08/10/2016 1610   MCV 104.3 (H) 07/13/2016 1602   MCH 34.3 (H) 07/13/2016 1602   MCH 34.3 (H) 03/15/2016 1703   MCHC 34.7 08/10/2016 1610   RDW 14.6 08/10/2016 1610   RDW 13.9 07/13/2016 1602   LYMPHSABS 2.0 08/10/2016 1610   LYMPHSABS 1.9 07/13/2016 1602   MONOABS 0.6 08/10/2016 1610   MONOABS 0.5 07/13/2016 1602   EOSABS 0.2 08/10/2016 1610   EOSABS 0.5 07/13/2016 1602   BASOSABS 0.1 08/10/2016 1610   BASOSABS 0.1 07/13/2016 1602   CMP     Component Value Date/Time   NA 140 08/10/2016 1610   NA 143 07/13/2016 1602   K 3.4 (L) 08/10/2016 1610   K 3.4 (L) 07/13/2016 1602   CL 106 08/10/2016 1610   CO2 28 08/10/2016 1610   CO2 23 07/13/2016 1602   GLUCOSE 77 08/10/2016 1610   GLUCOSE 111  07/13/2016 1602   BUN 10 08/10/2016 1610   BUN 10.7 07/13/2016 1602   CREATININE 0.91 08/10/2016 1610   CREATININE 1.0 07/13/2016 1602   CALCIUM 8.3 (L) 08/10/2016 1610   CALCIUM 8.2 (L) 07/13/2016 1602   PROT 7.8 08/10/2016 1610   PROT 7.7 07/13/2016 1602   ALBUMIN 3.0 (L) 08/10/2016 1610   ALBUMIN 2.5 (L) 07/13/2016 1602   AST 106 (H) 08/10/2016 1610   AST 152 (H) 07/13/2016 1602   ALT 41 08/10/2016 1610   ALT 61 (H) 07/13/2016 1602   ALKPHOS 107 08/10/2016 1610   ALKPHOS 132 07/13/2016 1602   BILITOT 3.1 (H) 08/10/2016 1610   BILITOT 2.92 (H) 07/13/2016 1602   GFRNONAA >60 03/15/2016 1703   GFRNONAA >89 01/22/2016 0841   GFRAA >60 03/15/2016 1703   GFRAA >89 01/22/2016 0841   Lab Results  Component Value Date   INR 1.3 (H) 08/10/2016   INR 1.30 (L) 07/13/2016   INR 1.35 12/23/2015   PROTIME 15.6 (H) 07/13/2016   PROTIME 13.2 11/13/2015       Assessment & Plan:  65 year old male with a past medical history of alcohol and hepatitis C cirrhosis complicated by hepatocellular carcinoma who is here for  follow-up  1. Decompensated Hep C + ETOH cirrhosis (CTP- B) -- He has developed ascites with lower extremity edema and unfortunately continues to drink alcohol. We had a frank discussion today regarding the severity of his liver disease and implications if he continues to drink. We discussed his short life expectancy if he continues to drink alcohol. We had a discussion today about the importance of a low sodium diet. He was given handouts on cirrhosis, hepatitis C, and low sodium diet. --CBC, CMP and INR today --Low sodium diet --Begin Lasix 40 mg daily, spironolactone 100 mg daily. Repeat basic metabolic panel to check kidney function and 7 days --We discussed the importance of alcohol avoidance today at length. He seems willing to try to stop alcohol in light of his recent decompensation --HCC -- history of status post microwave ablation. Recent MRI reviewed which shows good  response. He hopefully will follow closely with Dr. Burr Medico. She is following AFP and plans to repeat MRI liver in 6 months --Return in 4 weeks for a visit with advanced practitioner --Variceal screening no varices as of earlier this year. If he remains decompensated I recommend consideration of repeat EGD  2. Colorectal cancer screening -- hyperplastic sigmoid polyp removed February 2017. Repeat in 10 years  25 minutes spent with the patient today. Greater than 50% was spent in counseling and coordination of care with the patient

## 2016-08-10 NOTE — Patient Instructions (Addendum)
You have been scheduled to see Nicoletta Ba, PA-C in follow up on Monday, 09/13/16 @ 9 am.  Your physician has requested that you go to the basement for the following lab work before leaving today: CBC, CMP, INR  Your physician has requested that you go to the basement for the following lab work on 08/17/16 or 08/18/16: BMP  We have sent the following medications to your pharmacy for you to pick up at your convenience: Aldactone 100 mg daily Lasix 40 mg daily  Abstain from all alcohol.  Follow a strict low sodium diet (less than 2 grams of sodium daily)   Low-Sodium Eating Plan Sodium raises blood pressure and causes water to be held in the body. Getting less sodium from food will help lower your blood pressure, reduce any swelling, and protect your heart, liver, and kidneys. We get sodium by adding salt (sodium chloride) to food. Most of our sodium comes from canned, boxed, and frozen foods. Restaurant foods, fast foods, and pizza are also very high in sodium. Even if you take medicine to lower your blood pressure or to reduce fluid in your body, getting less sodium from your food is important. WHAT IS MY PLAN? Most people should limit their sodium intake to 2,300 mg a day. Your health care provider recommends that you limit your sodium intake to __________ a day.  WHAT DO I NEED TO KNOW ABOUT THIS EATING PLAN? For the low-sodium eating plan, you will follow these general guidelines:  Choose foods with a % Daily Value for sodium of less than 5% (as listed on the food label).   Use salt-free seasonings or herbs instead of table salt or sea salt.   Check with your health care provider or pharmacist before using salt substitutes.   Eat fresh foods.  Eat more vegetables and fruits.  Limit canned vegetables. If you do use them, rinse them well to decrease the sodium.   Limit cheese to 1 oz (28 g) per day.   Eat lower-sodium products, often labeled as "lower sodium" or "no salt  added."  Avoid foods that contain monosodium glutamate (MSG). MSG is sometimes added to [place] food and some canned foods.  Check food labels (Nutrition Facts labels) on foods to learn how much sodium is in one serving.  Eat more home-cooked food and less restaurant, buffet, and fast food.  When eating at a restaurant, ask that your food be prepared with less salt, or no salt if possible.  HOW DO I READ FOOD LABELS FOR SODIUM INFORMATION? The Nutrition Facts label lists the amount of sodium in one serving of the food. If you eat more than one serving, you must multiply the listed amount of sodium by the number of servings. Food labels may also identify foods as:  Sodium free--Less than 5 mg in a serving.  Very low sodium--35 mg or less in a serving.  Low sodium--140 mg or less in a serving.  Light in sodium--50% less sodium in a serving. For example, if a food that usually has 300 mg of sodium is changed to become light in sodium, it will have 150 mg of sodium.  Reduced sodium--25% less sodium in a serving. For example, if a food that usually has 400 mg of sodium is changed to reduced sodium, it will have 300 mg of sodium. WHAT FOODS CAN I EAT? Grains Low-sodium cereals, including oats, puffed wheat and rice, and shredded wheat cereals. Low-sodium crackers. Unsalted rice and pasta. Lower-sodium bread.  Vegetables Frozen or fresh vegetables. Low-sodium or reduced-sodium canned vegetables. Low-sodium or reduced-sodium tomato sauce and paste. Low-sodium or reduced-sodium tomato and vegetable juices.  Fruits Fresh, frozen, and canned fruit. Fruit juice.  Meat and Other Protein Products Low-sodium canned tuna and salmon. Fresh or frozen meat, poultry, seafood, and fish. Lamb. Unsalted nuts. Dried beans, peas, and lentils without added salt. Unsalted canned beans. Homemade soups without salt. Eggs.  Dairy Milk. Soy milk. Ricotta cheese. Low-sodium or reduced-sodium cheeses.  Yogurt.  Condiments Fresh and dried herbs and spices. Salt-free seasonings. Onion and garlic powders. Low-sodium varieties of mustard and ketchup. Fresh or refrigerated horseradish. Lemon juice.  Fats and Oils Reduced-sodium salad dressings. Unsalted butter.  Other Unsalted popcorn and pretzels.  The items listed above may not be a complete list of recommended foods or beverages. Contact your dietitian for more options. WHAT FOODS ARE NOT RECOMMENDED? Grains Instant hot cereals. Bread stuffing, pancake, and biscuit mixes. Croutons. Seasoned rice or pasta mixes. Noodle soup cups. Boxed or frozen macaroni and cheese. Self-rising flour. Regular salted crackers. Vegetables Regular canned vegetables. Regular canned tomato sauce and paste. Regular tomato and vegetable juices. Frozen vegetables in sauces. Salted [place] fries. Olives. Angie Fava. Relishes. Sauerkraut. Salsa. Meat and Other Protein Products Salted, canned, smoked, spiced, or pickled meats, seafood, or fish. Bacon, ham, sausage, hot dogs, corned beef, chipped beef, and packaged luncheon meats. Salt pork. Jerky. Pickled herring. Anchovies, regular canned tuna, and sardines. Salted nuts. Dairy Processed cheese and cheese spreads. Cheese curds. Blue cheese and cottage cheese. Buttermilk.  Condiments Onion and garlic salt, seasoned salt, table salt, and sea salt. Canned and packaged gravies. Worcestershire sauce. Tartar sauce. Barbecue sauce. Teriyaki sauce. Soy sauce, including reduced sodium. Steak sauce. Fish sauce. Oyster sauce. Cocktail sauce. Horseradish that you find on the shelf. Regular ketchup and mustard. Meat flavorings and tenderizers. Bouillon cubes. Hot sauce. Tabasco sauce. Marinades. Taco seasonings. Relishes. Fats and Oils Regular salad dressings. Salted butter. Margarine. Ghee. Bacon fat.  Other Potato and tortilla chips. Corn chips and puffs. Salted popcorn and pretzels. Canned or dried soups. Pizza. Frozen  entrees and pot pies.  The items listed above may not be a complete list of foods and beverages to avoid. Contact your dietitian for more information.   This information is not intended to replace advice given to you by your health care provider. Make sure you discuss any questions you have with your health care provider.   Document Released: 03/26/2002 Document Revised: 10/25/2014 Document Reviewed: 08/08/2013 Elsevier Interactive Patient Education [employer].

## 2016-08-10 NOTE — Progress Notes (Signed)
[facility] Nelson  Telephone:([phone]) [phone] Fax:([phone]  Clinic Follow Up Note   Patient Care Team: Micheline Chapman, NP as PCP - General (Family Medicine) Thayer Headings, MD as Consulting Physician (Infectious Diseases) Truitt Merle, MD as Consulting Physician (Hematology) 08/10/2016   CHIEF COMPLAINTS:  Follow up [facility]   Oncology History   Hepatocellular carcinoma ([place])   Staging form: Liver (Excluding Intrahepatic Bile Ducts), AJCC 7th Edition     Clinical stage from 11/26/2015: Stage I (T1, N0, M0) - Signed by Truitt Merle, MD on 12/02/2015       Hepatocellular carcinoma ([place])   11/21/2015 Imaging    liver MRI (done at [facility]) showed a fat containing mass in the right lobe of liver, measuring 2.4 x 2.3 cm      11/26/2015 Initial Diagnosis    Hepatocellular carcinoma ([place])      11/26/2015 Initial Biopsy    Right lobe liver mass biopsy showed [facility]      12/24/2015 Procedure    microwave ablation of solitary approximately 2.8 cm lesion within the cranial aspect of the anterior segment of the right lobe of the liver, by Dr. Pascal Lux       HISTORY OF PRESENTING ILLNESS (11/13/2015):  Alcario Tinkey Bauder 65 y.o. male with past medical history of appetite C, liver cirrhosis, is being referred by Dr.Comer for a newly discovered liver mass.    He was found to have Hep C about 3-4 years ago when he was in jail, he believes he got hepatitis C from tattoo. His hepatitis C has not been treated.   He had episode of chest pain 3-4 time since 05/2015 and  and unbalnace gates and was seen in the ED.  He had an episode of bloody diarrhea once with melano a few weeks weeks ago. He was referred to see primary care physician Dr. Smith Robert after ED visit. He was subsequently referred to infection disease specialist, for untreated hepatitis C. Passively and ultrasound of abdomen was obtained on 10/19/2015, which showed a 2.7 cm right hepatic lobe region, suspicious for hepatocellular carcinoma. He  subsequently underwent a liver MRI at a [facility] last week.  He feels well overall, has good appetie and eats small meals, no reently weight loss. He denies significant abdominal discomfort, bloating, nausea, or other symptoms. His bowel movement is normal, he denies history of GI bleeding, melena or hematochezia. He has a mild fatigue, but able to tolerate routine activities.  He has history of alcohol abuse, still drinks half pint a day, and also smoke half pack a day.  CURRENT THERAPY: Surveillance  INTERIM HISTORY: [name] returns for follow-up and to review his restaging scan. He has developed significant bilateral leg swelling in the past 2-3 weeks, with mild to moderate pain when he walks around, and mild abdominal distention, no fever or chills, no abdominal pain, nausea, or change of his bowel habits. No other new complaints.  MEDICAL HISTORY:  Past Medical History:  Diagnosis Date  . Alcohol abuse   . Allergy   . Arthritis   . Asthma   . Cancer ([place])    liver  . Hepatitis C   . Hepatocellular carcinoma ([place])   . Hyperplastic colon polyp   . Internal hemorrhoids   . Shortness of breath dyspnea    with activity and anxiety  . Wears glasses     SURGICAL HISTORY: Past Surgical History:  Procedure Laterality Date  . SKIN GRAFT  1991   Left  Hand    SOCIAL HISTORY: Social History   Social History  . Marital status: Married    Spouse name: [name]  . Number of children: 90  . Years of education: [name]   Occupational History  . Not on file.   Social History Main Topics  . Smoking status: Current Every Day Smoker    Packs/day: 0.50    Years: 55.00    Types: Cigarettes  . Smokeless tobacco: Never Used     Comment: cutting back  . Alcohol use 0.0 oz/week     Comment: "a 1/2 pint a day" for more than 30 years, average 1 pint a day, cut back lately   . Drug use: No  . Sexual activity: Not on file   Other Topics Concern  . Not on file   Social History Narrative    Married Dec 22nd, 2016 to [place]   Has #9 children    FAMILY HISTORY: Family History  Problem Relation Age of Onset  . Hypertension Mother   . Diabetes Mother   . Heart disease Mother   . Alzheimer's disease Mother   . Diabetes Sister   . Diabetes Father   . Cancer Brother     Liver  . Colon cancer Neg Hx   . Esophageal cancer Neg Hx   . Stomach cancer Neg Hx   . Rectal cancer Neg Hx     ALLERGIES:  is allergic to aspirin.  MEDICATIONS:  Current Outpatient Prescriptions  Medication Sig Dispense Refill  . hydrocortisone 1 % ointment Apply 1 application topically 2 (two) times daily. (Patient not taking: Reported on 08/10/2016) 1 g 1  . ibuprofen (ADVIL,MOTRIN) 200 MG tablet Take 200 mg by mouth every 4 (four) hours as needed for moderate pain.     No current facility-administered medications for this visit.     REVIEW OF SYSTEMS:   Constitutional: Denies fevers, chills or abnormal night sweats Eyes: Denies blurriness of vision, double vision or watery eyes Ears, nose, mouth, throat, and face: Denies mucositis or sore throat Respiratory: Denies cough, dyspnea or wheezes Cardiovascular: Denies palpitation, chest discomfort or lower extremity swelling Gastrointestinal:  Denies nausea, heartburn or change in bowel habits Skin: Denies abnormal skin rashes Lymphatics: Denies new lymphadenopathy or easy bruising Neurological:Denies numbness, tingling or new weaknesses Behavioral/Psych: Mood is stable, no new changes  All other systems were reviewed with the patient and are negative.  PHYSICAL EXAMINATION: ECOG PERFORMANCE STATUS: 2  Vitals:   08/10/16 1337  BP: (!) 153/79  Pulse: 87  Resp: 17  Temp: 98.5 F (36.9 C)   Filed Weights   08/10/16 1337  Weight: 143 lb 4.8 oz (65 kg)    GENERAL:alert, no distress and comfortable SKIN: skin color, texture, turgor are normal, no rashes or significant lesions EYES: normal, conjunctiva are pink and non-injected, sclera  clear OROPHARYNX:no exudate, no erythema and lips, buccal mucosa, and tongue normal  NECK: supple, thyroid normal size, non-tender, without nodularity LYMPH:  no palpable lymphadenopathy in the cervical, axillary or inguinal LUNGS: clear to auscultation and percussion with normal breathing effort HEART: regular rate & rhythm and no murmurs and no lower extremity edema ABDOMEN:abdomen soft, non-tender and normal bowel sounds, no organomegaly. Musculoskeletal:no cyanosis of digits and no clubbing  PSYCH: alert & oriented x 3 with fluent speech NEURO: no focal motor/sensory deficits EXT: Bilateral lower extremities 2+ pitting edema up to knee, with skin pigmentation  LABORATORY DATA:  I have reviewed the data as listed CBC Latest  Ref Rng & Units 07/13/2016 03/15/2016 02/19/2016  WBC 4.0 - 10.3 10e3/uL 4.5 5.5 3.7(L)  Hemoglobin 13.0 - 17.1 g/dL 12.5(L) 12.8(L) 14.1  Hematocrit 38.4 - 49.9 % 38.0(L) 36.2(L) 42.1  Platelets 140 - 400 10e3/uL 80(L) 85(L) 74(L)    Recent Labs  12/26/15 0325 01/22/16 0841 03/15/16 1703 05/11/16 1058 07/13/16 1602  NA 132* 136 137 139 143  K 3.8 3.5 3.4* 3.4* 3.4*  CL 101 104 105 105  --   CO2 25 21 25 24 23   GLUCOSE 109* 92 89 84 111  BUN 21* 13 11 12  10.7  CREATININE 1.19 0.90 0.77 0.86 1.0  CALCIUM 7.6* 8.5* 8.0* 8.1* 8.2*  GFRNONAA >60 >89 >60  --   --   GFRAA >60 >89 >60  --   --   PROT  --  7.7 7.9 7.7 7.7  ALBUMIN  --  3.1* 3.0* 3.1* 2.5*  AST  --  70* 94* 123* 152*  ALT  --  33 41 48* 61*  ALKPHOS  --  92 105 109 132  BILITOT  --  2.7* 2.5* 4.1* 2.92*   Results for MEAGAN, SPEASE (MRN [id number]) as of 07/15/2016 20:15  Ref. Range 10/30/2015 10:32 11/13/2015 16:10 05/11/2016 10:58 07/13/2016 16:02  AFP Tumor Marker Latest Ref Range: <6.1 ng/mL 95.2 (H)  5.3   AFP, Serum, Tumor Marker Latest Ref Range: 0.0 - 8.3 ng/mL  101.7 (H)  4.7    RADIOGRAPHIC STUDIES: I have personally reviewed the radiological images as listed and agreed with the  findings in the report. Mr Abdomen W Wo Contrast  Result Date: 07/27/2016 CLINICAL DATA:  65 year old male with history of hepatitis-C status post microwave ablation of a solitary lesion in the right lobe of the liver. Followup study. EXAM: MRI ABDOMEN WITHOUT AND WITH CONTRAST TECHNIQUE: Multiplanar multisequence MR imaging of the abdomen was performed both before and after the administration of intravenous contrast. CONTRAST:  6 mL of Eovist. COMPARISON:  CT the abdomen and pelvis 12/08/2015. Outside abdominal MRI 11/06/2015. FINDINGS: Lower chest: Unremarkable. Hepatobiliary: In the superior aspect of the right lobe of the liver, predominantly in segment 8, there is a well-defined area measuring 3.8 x 2.7 x 2.9 cm (axial image 28 of series 503 and coronal image 52 of series 6) which corresponds to the treated lesion. The area of treatment is heterogeneously T1 hyperintense, predominantly T2 hypointense, and demonstrates no significant internal enhancement on post gadolinium imaging to suggest residual/recurrent disease. No other new hepatic lesions are identified. The liver has a nodular contour, compatible with underlying cirrhosis. No intra or extrahepatic biliary ductal dilatation. Gallbladder is normal in appearance. Pancreas: No pancreatic mass. No pancreatic ductal dilatation. No focal pancreatic or peripancreatic fluid collections. Spleen:  Unremarkable. Adrenals/Urinary Tract: Bilateral adrenal glands and bilateral kidneys are normal in appearance. There is no hydroureteronephrosis in the visualized abdomen. Stomach/Bowel: Visualized portions are unremarkable. Vascular/Lymphatic: No aneurysm identified in the visualized abdominal vasculature. Portal vein is mildly dilated measuring 15 mm in the porta hepatis. No lymphadenopathy noted in the abdomen. Other:  Large volume of ascites. Musculoskeletal: No aggressive osseous lesions are noted in the visualized portions of the skeleton. IMPRESSION: 1. Post  treatment related changes from prior microwave ablation to lesion in the superior aspect of the right lobe of the liver (predominantly in segment 8). No evidence to suggest residual/recurrent disease, or definite metastatic disease on today's examination. 2. Interval development of large volume of ascites. 3. Cirrhosis. Electronically Signed  By: Vinnie Langton M.D.   On: 07/27/2016 11:27     Abdomen MRI w and wo contrast 11/06/2015 [facility] images) Impression: Fat containing mass in the liver, measuring 2.4 x 2.3 cm in the central of right lobe. Both hepatocellular carcinoma and adenomas can containing fat.   ASSESSMENT & PLAN:  65 year old African-American male, with past medical history of untreated hepatitis C, drug and alcohol abuse, liver cirrhosis, presented with a image discovered liver mass.  1. Hepatocellular carcinoma, cT1N0M0, stage I -He had a biopsy confirmed hepatocellular carcinoma, stage I, status post microwave ablation by Dr. Pascal Lux in 12/2015, but lost follow-up afterwards. -I reviewed his restaging liver MRI, which showed treatment effect, no significant residual or recurrent disease. -His AFP from last month was normal, which also indicating good response to ablation. -We again emphasized the importance of follow-up and surveillance, he agrees to follow -I plan to see him back in 3 months with repeated lab including AFP, if AFP normal, I'll plan to repeat his liver MRI in 6 months.  2. 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 Tonye Royalty today, he would need diuretics for his ascites and intact swelling.  3. Substance abuse (alcohol and smoking) -he has started drinking alcohol again. I strongly encourage  him to quit alcohol and smoking. He voiced good understanding, and is willing to try.   Plan -Follow up in 3 months with lab of 2 days before -I encouraged him to follow-up with Dr. Tonye Royalty for his ascites and leg edema.   All questions were answered. The patient knows to call the clinic with any problems, questions or concerns. I spent 25 minutes counseling the patient face to face. The total time spent in the appointment was 30 minutes and more than 50% was on counseling.     Truitt Merle, MD 08/10/2016

## 2016-08-18 ENCOUNTER — Other Ambulatory Visit: Payer: Self-pay

## 2016-08-18 ENCOUNTER — Other Ambulatory Visit (INDEPENDENT_AMBULATORY_CARE_PROVIDER_SITE_OTHER): Payer: Commercial Managed Care - HMO

## 2016-08-18 DIAGNOSIS — K7030 Alcoholic cirrhosis of liver without ascites: Secondary | ICD-10-CM

## 2016-08-18 LAB — BASIC METABOLIC PANEL
BUN: 14 mg/dL (ref 6–23)
CALCIUM: 8.7 mg/dL (ref 8.4–10.5)
CO2: 27 mEq/L (ref 19–32)
Chloride: 104 mEq/L (ref 96–112)
Creatinine, Ser: 0.83 mg/dL (ref 0.40–1.50)
GFR: 119.46 mL/min (ref >60.00)
Glucose, Bld: 147 mg/dL — ABNORMAL HIGH (ref 70–99)
Potassium: 3.5 mEq/L (ref 3.5–5.1)
SODIUM: 140 meq/L (ref 135–145)

## 2016-08-25 ENCOUNTER — Telehealth: Payer: Self-pay | Admitting: General Practice

## 2016-08-25 NOTE — Telephone Encounter (Signed)
Spoke with Mrs Cowell confirmed January 2018 appointment.

## 2016-09-13 ENCOUNTER — Ambulatory Visit: Payer: Medicare Other | Admitting: Physician Assistant

## 2016-09-16 ENCOUNTER — Encounter: Payer: Self-pay | Admitting: Family Medicine

## 2016-09-16 ENCOUNTER — Ambulatory Visit (INDEPENDENT_AMBULATORY_CARE_PROVIDER_SITE_OTHER): Payer: Commercial Managed Care - HMO | Admitting: Family Medicine

## 2016-09-16 VITALS — BP 137/75 | HR 88 | Temp 98.1°F | Resp 14 | Ht 66.0 in | Wt 135.0 lb

## 2016-09-16 DIAGNOSIS — Z0000 Encounter for general adult medical examination without abnormal findings: Secondary | ICD-10-CM

## 2016-09-16 DIAGNOSIS — Z23 Encounter for immunization: Secondary | ICD-10-CM | POA: Diagnosis not present

## 2016-09-16 LAB — LIPID PANEL
CHOL/HDL RATIO: 2.7 ratio (ref <5.0)
CHOLESTEROL: 138 mg/dL (ref <200)
HDL: 51 mg/dL (ref >40)
LDL CALC: 70 mg/dL (ref <100)
Triglycerides: 84 mg/dL (ref <150)
VLDL: 17 mg/dL (ref <30)

## 2016-09-16 MED ORDER — PNEUMOCOCCAL 13-VAL CONJ VACC IM SUSP
0.5000 mL | INTRAMUSCULAR | Status: AC | PRN
  Administered 2016-09-16: 0.5 mL via INTRAMUSCULAR

## 2016-09-16 MED ORDER — AZITHROMYCIN 250 MG PO TABS: ORAL_TABLET | ORAL | 0 refills | Status: DC

## 2016-09-16 NOTE — Patient Instructions (Signed)
Am prescribing antibiotic for possible bronchitis. Drink lots of liquids. Follow-up if needed

## 2016-09-16 NOTE — Progress Notes (Signed)
Peter Nelson, is a 65 y.o. male  HTD:[id number]  BWI:[id number]  DOB - [birth year]  CC:  Chief Complaint  Patient presents with  . Follow-up  . Eczema    eczema flare up on back   . Cough    coughing yellow mucus x 2 weeks.        HPI: [name] is a 65 y.o. male here for follow-up. He has serious liver issues and is followed by GI and oncology. His complaints today are of an eczema flare on his back and legs that is not helped significantly by cortisone cream. He also reports a 2 weeks history of cough that seems to be worsening. He reports smoking 1/2 pack of cigarettes daily and drink 1/2 pint of alcohol daily. He will receive Prevnar today.    Allergies  Allergen Reactions  . Aspirin Other (See Comments)    Does not take because of hep c   Past Medical History:  Diagnosis Date  . Alcohol abuse   . Allergy   . Arthritis   . Asthma   . Cancer ([place])    liver  . Hepatitis C   . Hepatocellular carcinoma ([place])   . Hyperplastic colon polyp   . Internal hemorrhoids   . Shortness of breath dyspnea    with activity and anxiety  . Ulcerative colitis ([place])   . Wears glasses    Current Outpatient Prescriptions on File Prior to Visit  Medication Sig Dispense Refill  . furosemide (LASIX) 40 MG tablet Take 1 tablet (40 mg total) by mouth daily. (Patient not taking: Reported on 09/16/2016) 30 tablet 1  . spironolactone (ALDACTONE) 100 MG tablet Take 1 tablet (100 mg total) by mouth daily. (Patient not taking: Reported on 09/16/2016) 30 tablet 1   No current facility-administered medications on file prior to visit.    Family History  Problem Relation Age of Onset  . Hypertension Mother   . Diabetes Mother   . Heart disease Mother   . Alzheimer's disease Mother   . Diabetes Sister   . Diabetes Father   . Cancer Brother     Liver  . Colon cancer Neg Hx   . Esophageal cancer Neg Hx   . Stomach cancer Neg Hx   . Rectal cancer Neg Hx    Social History   Social  History  . Marital status: Married    Spouse name: [name]  . Number of children: 72  . Years of education: [name]   Occupational History  . Not on file.   Social History Main Topics  . Smoking status: Current Every Day Smoker    Packs/day: 0.50    Years: 55.00    Types: Cigarettes  . Smokeless tobacco: Never Used     Comment: cutting back  . Alcohol use 0.0 oz/week     Comment: "a 1/2 pint a day" for more than 30 years, average 1 pint a day, cut back lately   . Drug use: No  . Sexual activity: Not on file   Other Topics Concern  . Not on file   Social History Narrative   Married Dec 22nd, 2016 to [place]   Has #9 children    Review of Systems: Constitutional: Negative Skin: + for rash and itching HENT: + for ST Eyes: Negative  Neck: Negative Respiratory: Cardiovascular: Negative+ for cough Gastrointestinal: Negative Genitourinary: Negative  Musculoskeletal: Negative   Neurological: Negative for Hematological: Negative  Psychiatric/Behavioral: Negative    Objective:   Vitals:  09/16/16 1115  BP: 137/75  Pulse: 88  Resp: 14  Temp: 98.1 F (36.7 C)    Physical Exam: Constitutional: Patient appears well-developed and well-nourished. No distress. HENT: Normocephalic, atraumatic, External right and left ear normal. Oropharynx is clear and moist.  Eyes: Conjunctivae and EOM are normal. PERRLA, no scleral icterus. Neck: Normal ROM. Neck supple. No lymphadenopathy, No thyromegaly. CVS: RRR, S1/S2 +, no murmurs, no gallops, no rubs Pulmonary: Effort and breath sounds normal, no stridor, rhonchi, wheezes, rales.  Abdominal: Soft. Normoactive BS,, no distension, tenderness, rebound or guarding.  Musculoskeletal: Normal range of motion. No edema and no tenderness.  Neuro: Alert.Normal muscle tone coordination. Non-focal Skin: Skin is warm and dry.There are area of dry skin and a slightly dry rash on his back and legs. Psychiatric: Normal mood and affect. Behavior,  judgment, thought content normal.  Lab Results  Component Value Date   WBC 4.4 08/10/2016   HGB 13.0 08/10/2016   HCT 37.5 (L) 08/10/2016   MCV 100.8 (H) 08/10/2016   PLT 78.0 (L) 08/10/2016   Lab Results  Component Value Date   CREATININE 0.83 08/18/2016   BUN 14 08/18/2016   NA 140 08/18/2016   K 3.5 08/18/2016   CL 104 08/18/2016   CO2 27 08/18/2016    No results found for: HGBA1C Lipid Panel     Component Value Date/Time   CHOL 177 07/28/2015 1012   TRIG 122 07/28/2015 1012   HDL 59 07/28/2015 1012   CHOLHDL 3.0 07/28/2015 1012   VLDL 24 07/28/2015 1012   LDLCALC 94 07/28/2015 1012        Assessment and plan:   1. Healthcare maintenance  - Hemoglobin A1c - Lipid panel  2. Need for prophylactic vaccination against Streptococcus pneumoniae (pneumococcus)  - pneumococcal 13-valent conjugate vaccine (PREVNAR 13) injection 0.5 mL; Inject 0.5 mLs into the muscle Prior to discharge for immunization.  3. Cough -Zithromax Zpak, #1 peer directions   Return in about 6 months (around 03/16/2017).  The patient was given clear instructions to go to ER or return to medical center if symptoms don't improve, worsen or new problems develop. The patient verbalized understanding.    Micheline Chapman FNP  09/16/2016, 1:02 PM

## 2016-09-17 LAB — HEMOGLOBIN A1C
Hgb A1c MFr Bld: 4.4 % (ref <5.7)
Mean Plasma Glucose: 80 mg/dL

## 2016-10-01 ENCOUNTER — Ambulatory Visit (INDEPENDENT_AMBULATORY_CARE_PROVIDER_SITE_OTHER): Payer: Commercial Managed Care - HMO | Admitting: Family Medicine

## 2016-10-01 ENCOUNTER — Encounter: Payer: Self-pay | Admitting: Family Medicine

## 2016-10-01 VITALS — BP 143/75 | HR 72 | Temp 97.6°F | Resp 18 | Ht 66.5 in | Wt 138.0 lb

## 2016-10-01 DIAGNOSIS — F1010 Alcohol abuse, uncomplicated: Secondary | ICD-10-CM

## 2016-10-01 DIAGNOSIS — C220 Liver cell carcinoma: Secondary | ICD-10-CM

## 2016-10-01 DIAGNOSIS — N529 Male erectile dysfunction, unspecified: Secondary | ICD-10-CM | POA: Insufficient documentation

## 2016-10-01 DIAGNOSIS — N400 Enlarged prostate without lower urinary tract symptoms: Secondary | ICD-10-CM | POA: Diagnosis not present

## 2016-10-01 DIAGNOSIS — F17200 Nicotine dependence, unspecified, uncomplicated: Secondary | ICD-10-CM

## 2016-10-01 LAB — POCT URINALYSIS DIP (DEVICE)
Glucose, UA: 100 mg/dL — AB
KETONES UR: NEGATIVE mg/dL
Leukocytes, UA: NEGATIVE
Nitrite: NEGATIVE
PH: 5.5 (ref 5.0–8.0)
PROTEIN: 100 mg/dL — AB
Urobilinogen, UA: 4 mg/dL — ABNORMAL HIGH (ref 0.0–1.0)

## 2016-10-01 MED ORDER — SILDENAFIL CITRATE 25 MG PO TABS: 25.0000 mg | ORAL_TABLET | Freq: Every day | ORAL | 0 refills | Status: DC | PRN

## 2016-10-01 NOTE — Progress Notes (Signed)
Patient is here for FU  Patient denies pain at this time.  Patient declined flu vaccine today.  Patient has taken medication today. Patient has not eaten today.

## 2016-10-01 NOTE — Patient Instructions (Addendum)
Erectile Dysfunction Erectile dysfunction is the inability to get or sustain a good enough erection to have sexual intercourse. Erectile dysfunction may involve:  Inability to get an erection.  Lack of enough hardness to allow penetration.  Loss of the erection before sex is finished.  Premature ejaculation. CAUSES  Certain drugs, such as:  Pain relievers.  Antihistamines.  Antidepressants.  Blood pressure medicines.  Water pills (diuretics).  Ulcer medicines.  Muscle relaxants.  Illegal drugs.  Excessive drinking.  Psychological causes, such as:  Anxiety.  Depression.  Sadness.  Exhaustion.  Performance fear.  Stress.  Physical causes, such as:  Artery problems. This may include diabetes, smoking, liver disease, or atherosclerosis.  High blood pressure.  Hormonal problems, such as low testosterone.  Obesity.  Nerve problems. This may include back or pelvic injuries, diabetes mellitus, multiple sclerosis, or Parkinson disease. SYMPTOMS  Inability to get an erection.  Lack of enough hardness to allow penetration.  Loss of the erection before sex is finished.  Premature ejaculation.  Normal erections at some times, but with frequent unsatisfactory episodes.  Orgasms that are not satisfactory in sensation or frequency.  Low sexual satisfaction in either partner because of erection problems.  A curved penis occurring with erection. The curve may cause pain or may be too curved to allow for intercourse.  Never having nighttime erections. DIAGNOSIS Your caregiver can often diagnose this condition by:  Performing a physical exam to find other diseases or specific problems with the penis.  Asking you detailed questions about the problem.  Performing blood tests to check for diabetes mellitus or to measure hormone levels.  Performing urine tests to find other underlying health conditions.  Performing an ultrasound exam to check for  scarring.  Performing a test to check blood flow to the penis.  Doing a sleep study at home to measure nighttime erections. TREATMENT   You may be prescribed medicines by mouth.  You may be given medicine injections into the penis.  You may be prescribed a vacuum pump with a ring.  Penile implant surgery may be performed. You may receive:  An inflatable implant.  A semirigid implant.  Blood vessel surgery may be performed. HOME CARE INSTRUCTIONS  If you are prescribed oral medicine, you should take the medicine as prescribed. Do not increase the dosage without first discussing it with your physician.  If you are using self-injections, be careful to avoid any veins that are on the surface of the penis. Apply pressure to the injection site for 5 minutes.  If you are using a vacuum pump, make sure you have read the instructions before using it. Discuss any questions with your physician before taking the pump home. SEEK MEDICAL CARE IF:  You experience pain that is not responsive to the pain medicine you have been prescribed.  You experience nausea or vomiting. SEEK IMMEDIATE MEDICAL CARE IF:   When taking oral or injectable medications, you experience an erection that lasts longer than 4 hours. If your physician is unavailable, go to the nearest emergency room for evaluation. An erection that lasts much longer than 4 hours can result in permanent damage to your penis.  You have pain that is severe.  You develop redness, severe pain, or severe swelling of your penis.  You have redness spreading up into your groin or lower abdomen.  You are unable to pass your urine. This information is not intended to replace advice given to you by your health care provider. Make sure you  discuss any questions you have with your health care provider. Document Released: 10/01/2000 Document Revised: 06/06/2013 Document Reviewed: 03/08/2013 Elsevier Interactive Patient Education  2017 [employer].

## 2016-10-01 NOTE — Progress Notes (Signed)
Subjective:    Patient ID: Peter Nelson, male    DOB: 09/13/1951, 65 y.o.   MRN: [id number]  HPI   Mr. Peter Nelson, a 65 year old male, accompanied by his wife,  with a history of alcohol abuse, hepatocellular carcinoma, and tobacco use disorder presents complaining of erectile dysfunction. He has been having difficulty getting and maintaining an erection over the past 5-6 months. He says that erection is never full. Patient has not had a prostate exam in 2 years. He denies fever, headache, myalgias, abdominal pain, constipation, and dysuria.  Past Medical History:  Diagnosis Date  . Alcohol abuse   . Allergy   . Arthritis   . Asthma   . Cancer ([place])    liver  . Hepatitis C   . Hepatocellular carcinoma ([place])   . Hyperplastic colon polyp   . Internal hemorrhoids   . Shortness of breath dyspnea    with activity and anxiety  . Ulcerative colitis ([place])   . Wears glasses    Social History   Social History  . Marital status: Married    Spouse name: [name]  . Number of children: 15  . Years of education: [name]   Occupational History  . Not on file.   Social History Main Topics  . Smoking status: Current Every Day Smoker    Packs/day: 0.50    Years: 55.00    Types: Cigarettes  . Smokeless tobacco: Never Used     Comment: cutting back  . Alcohol use 0.0 oz/week     Comment: "a 1/2 pint a day" for more than 30 years, average 1 pint a day, cut back lately   . Drug use: No  . Sexual activity: Not on file   Other Topics Concern  . Not on file   Social History Narrative   Married Dec 22nd, 2016 to Peter Nelson   Has #9 children    Immunization History  Administered Date(s) Administered  . Hepatitis B, adult 09/17/2015, 10/30/2015  . Pneumococcal Conjugate-13 09/16/2016  . Pneumococcal Polysaccharide-23 09/17/2015   Immunization History  Administered Date(s) Administered  . Hepatitis B, adult 09/17/2015, 10/30/2015  . Pneumococcal Conjugate-13 09/16/2016  . Pneumococcal  Polysaccharide-23 09/17/2015   Review of Systems  Constitutional: Positive for unexpected weight change (weight loss). Negative for fatigue and fever.  HENT: Negative.   Eyes: Negative.  Negative for photophobia and visual disturbance.  Respiratory: Negative.   Cardiovascular: Negative.  Negative for chest pain, palpitations and leg swelling.  Gastrointestinal: Negative.   Endocrine: Negative.  Negative for polydipsia, polyphagia and polyuria.  Genitourinary: Negative.  Negative for decreased urine volume, discharge, dysuria, flank pain, frequency, hematuria, penile pain, penile swelling, scrotal swelling, testicular pain and urgency.  Musculoskeletal: Negative.   Skin: Negative.   Allergic/Immunologic: Positive for immunocompromised state.  Neurological: Negative.   Hematological: Negative.   Psychiatric/Behavioral: Negative.        Objective:   Physical Exam  Constitutional: He is oriented to person, place, and time. He appears well-developed and well-nourished.  HENT:  Head: Normocephalic and atraumatic.  Right Ear: External ear normal.  Left Ear: External ear normal.  Nose: Nose normal.  Mouth/Throat: Oropharynx is clear and moist.  Eyes: Conjunctivae and EOM are normal. Pupils are equal, round, and reactive to light.  Neck: Normal range of motion. Neck supple.  Cardiovascular: Normal rate, regular rhythm, normal heart sounds and intact distal pulses.   Pulmonary/Chest: Effort normal and breath sounds normal.  Abdominal: Soft. Bowel sounds  are normal.  Genitourinary: Rectal exam shows no external hemorrhoid, no internal hemorrhoid, no fissure, no mass, no tenderness and anal tone normal. Prostate is enlarged and tender.  Musculoskeletal: Normal range of motion.  Neurological: He is alert and oriented to person, place, and time. He has normal reflexes.  Skin: Skin is warm and dry.  Psychiatric: He has a normal mood and affect. His behavior is normal. Judgment and thought  content normal.       Assessment & Plan:  1. Erectile dysfunction, unspecified erectile dysfunction type - sildenafil (VIAGRA) 25 MG tablet; Take 1 tablet (25 mg total) by mouth daily as needed for erectile dysfunction.  Dispense: 10 tablet; Refill: 0  2. Enlarged prostate on rectal examination - PSA - Urinalysis, Complete  3. Alcohol abuse Patient has recently cut down on alcohol use. He primarily drinks vodka daily.   4. Hepatocellular carcinoma ([place])  Patient to follow up with oncology as previously scheduled.  5. Tobacco use disorder Smoking cessation instruction/counseling given:  counseled patient on the dangers of tobacco use, advised patient to stop smoking, and reviewed strategies to maximize success    RTC: Will follow up by phone with laboratory results [name], FNP

## 2016-10-02 LAB — URINALYSIS, COMPLETE
Bacteria, UA: NONE SEEN [HPF]
Bilirubin Urine: NEGATIVE
Casts: NONE SEEN [LPF]
Glucose, UA: NEGATIVE
KETONES UR: NEGATIVE
Nitrite: NEGATIVE
SPECIFIC GRAVITY, URINE: 1.025 (ref 1.001–1.035)
Squamous Epithelial / LPF: NONE SEEN [HPF] (ref <=5)
Yeast: NONE SEEN [HPF]
pH: 5.5 (ref 5.0–8.0)

## 2016-10-02 LAB — PSA: PSA: 0.4 ng/mL (ref <=4.0)

## 2016-10-25 ENCOUNTER — Inpatient Hospital Stay (HOSPITAL_COMMUNITY)
Admission: EM | Admit: 2016-10-25 | Discharge: 2016-10-30 | DRG: 432 | Disposition: A | Discharge status: Home / Self Care | Payer: Medicare HMO | Attending: Internal Medicine | Admitting: Internal Medicine

## 2016-10-25 ENCOUNTER — Encounter (HOSPITAL_COMMUNITY): Payer: Self-pay

## 2016-10-25 ENCOUNTER — Emergency Department (HOSPITAL_COMMUNITY): Payer: Medicare HMO

## 2016-10-25 DIAGNOSIS — F17210 Nicotine dependence, cigarettes, uncomplicated: Secondary | ICD-10-CM | POA: Diagnosis present

## 2016-10-25 DIAGNOSIS — K7030 Alcoholic cirrhosis of liver without ascites: Secondary | ICD-10-CM

## 2016-10-25 DIAGNOSIS — R609 Edema, unspecified: Secondary | ICD-10-CM | POA: Diagnosis not present

## 2016-10-25 DIAGNOSIS — K449 Diaphragmatic hernia without obstruction or gangrene: Secondary | ICD-10-CM | POA: Diagnosis not present

## 2016-10-25 DIAGNOSIS — R0602 Shortness of breath: Secondary | ICD-10-CM | POA: Diagnosis not present

## 2016-10-25 DIAGNOSIS — C220 Liver cell carcinoma: Secondary | ICD-10-CM | POA: Diagnosis present

## 2016-10-25 DIAGNOSIS — K209 Esophagitis, unspecified: Secondary | ICD-10-CM | POA: Diagnosis not present

## 2016-10-25 DIAGNOSIS — K7460 Unspecified cirrhosis of liver: Secondary | ICD-10-CM | POA: Diagnosis present

## 2016-10-25 DIAGNOSIS — I8511 Secondary esophageal varices with bleeding: Secondary | ICD-10-CM | POA: Diagnosis not present

## 2016-10-25 DIAGNOSIS — B182 Chronic viral hepatitis C: Secondary | ICD-10-CM | POA: Diagnosis present

## 2016-10-25 DIAGNOSIS — D62 Acute posthemorrhagic anemia: Secondary | ICD-10-CM | POA: Diagnosis present

## 2016-10-25 DIAGNOSIS — F1010 Alcohol abuse, uncomplicated: Secondary | ICD-10-CM | POA: Diagnosis present

## 2016-10-25 DIAGNOSIS — K648 Other hemorrhoids: Secondary | ICD-10-CM | POA: Diagnosis present

## 2016-10-25 DIAGNOSIS — K922 Gastrointestinal hemorrhage, unspecified: Secondary | ICD-10-CM

## 2016-10-25 DIAGNOSIS — I8500 Esophageal varices without bleeding: Secondary | ICD-10-CM | POA: Diagnosis not present

## 2016-10-25 DIAGNOSIS — Z8505 Personal history of malignant neoplasm of liver: Secondary | ICD-10-CM

## 2016-10-25 DIAGNOSIS — B1920 Unspecified viral hepatitis C without hepatic coma: Secondary | ICD-10-CM | POA: Diagnosis not present

## 2016-10-25 DIAGNOSIS — Z79899 Other long term (current) drug therapy: Secondary | ICD-10-CM

## 2016-10-25 DIAGNOSIS — K7031 Alcoholic cirrhosis of liver with ascites: Principal | ICD-10-CM | POA: Diagnosis present

## 2016-10-25 DIAGNOSIS — D689 Coagulation defect, unspecified: Secondary | ICD-10-CM | POA: Diagnosis not present

## 2016-10-25 DIAGNOSIS — K920 Hematemesis: Secondary | ICD-10-CM | POA: Diagnosis not present

## 2016-10-25 DIAGNOSIS — D6959 Other secondary thrombocytopenia: Secondary | ICD-10-CM | POA: Diagnosis present

## 2016-10-25 DIAGNOSIS — D684 Acquired coagulation factor deficiency: Secondary | ICD-10-CM | POA: Diagnosis present

## 2016-10-25 DIAGNOSIS — R079 Chest pain, unspecified: Secondary | ICD-10-CM | POA: Diagnosis not present

## 2016-10-25 DIAGNOSIS — J45909 Unspecified asthma, uncomplicated: Secondary | ICD-10-CM | POA: Diagnosis present

## 2016-10-25 DIAGNOSIS — K649 Unspecified hemorrhoids: Secondary | ICD-10-CM | POA: Diagnosis not present

## 2016-10-25 DIAGNOSIS — D500 Iron deficiency anemia secondary to blood loss (chronic): Secondary | ICD-10-CM | POA: Diagnosis not present

## 2016-10-25 LAB — COMPREHENSIVE METABOLIC PANEL
ALT: 45 U/L (ref 17–63)
AST: 127 U/L — ABNORMAL HIGH (ref 15–41)
Albumin: 2.4 g/dL — ABNORMAL LOW (ref 3.5–5.0)
Alkaline Phosphatase: 81 U/L (ref 38–126)
Anion gap: 7 (ref 5–15)
BILIRUBIN TOTAL: 3 mg/dL — AB (ref 0.3–1.2)
BUN: 34 mg/dL — ABNORMAL HIGH (ref 6–20)
CHLORIDE: 107 mmol/L (ref 101–111)
CO2: 25 mmol/L (ref 22–32)
CREATININE: 0.95 mg/dL (ref 0.61–1.24)
Calcium: 7.9 mg/dL — ABNORMAL LOW (ref 8.9–10.3)
GFR calc non Af Amer: >60 mL/min (ref >60)
Glucose, Bld: 105 mg/dL — ABNORMAL HIGH (ref 65–99)
POTASSIUM: 3.5 mmol/L (ref 3.5–5.1)
Sodium: 139 mmol/L (ref 135–145)
TOTAL PROTEIN: 6.4 g/dL — AB (ref 6.5–8.1)

## 2016-10-25 LAB — I-STAT CG4 LACTIC ACID, ED: LACTIC ACID, VENOUS: 2.28 mmol/L — AB (ref 0.5–1.9)

## 2016-10-25 LAB — CBC
HEMATOCRIT: 23.4 % — AB (ref 39.0–52.0)
Hemoglobin: 7.9 g/dL — ABNORMAL LOW (ref 13.0–17.0)
MCH: 35.1 pg — ABNORMAL HIGH (ref 26.0–34.0)
MCHC: 33.8 g/dL (ref 30.0–36.0)
MCV: 104 fL — AB (ref 78.0–100.0)
PLATELETS: 94 10*3/uL — AB (ref 150–400)
RBC: 2.25 MIL/uL — AB (ref 4.22–5.81)
RDW: 15.1 % (ref 11.5–15.5)
WBC: 6.2 10*3/uL (ref 4.0–10.5)

## 2016-10-25 LAB — PROTIME-INR
INR: 1.61
Prothrombin Time: 19.3 seconds — ABNORMAL HIGH (ref 11.4–15.2)

## 2016-10-25 LAB — PREPARE RBC (CROSSMATCH)

## 2016-10-25 MED ORDER — LORAZEPAM 2 MG/ML IJ SOLN: 0.0000 mg | Freq: Two times a day (BID) | INTRAMUSCULAR | Status: AC

## 2016-10-25 MED ORDER — LORAZEPAM 2 MG/ML IJ SOLN: 1.0000 mg | Freq: Four times a day (QID) | INTRAMUSCULAR | Status: AC | PRN

## 2016-10-25 MED ORDER — ONDANSETRON HCL 4 MG/2ML IJ SOLN
4.0000 mg | Freq: Four times a day (QID) | INTRAMUSCULAR | Status: DC | PRN
  Administered 2016-10-28: 4 mg via INTRAVENOUS
  Filled 2016-10-25: qty 2

## 2016-10-25 MED ORDER — THIAMINE HCL 100 MG/ML IJ SOLN
100.0000 mg | Freq: Every day | INTRAMUSCULAR | Status: DC
  Administered 2016-10-26: 100 mg via INTRAVENOUS
  Filled 2016-10-25: qty 2

## 2016-10-25 MED ORDER — SODIUM CHLORIDE 0.9 % IV SOLN
INTRAVENOUS | Status: DC
  Administered 2016-10-25: 23:00:00 via INTRAVENOUS

## 2016-10-25 MED ORDER — ADULT MULTIVITAMIN W/MINERALS CH
1.0000 | ORAL_TABLET | Freq: Every day | ORAL | Status: DC
  Administered 2016-10-27 – 2016-10-30 (×4): 1 via ORAL
  Filled 2016-10-25 (×4): qty 1

## 2016-10-25 MED ORDER — LORAZEPAM 2 MG/ML IJ SOLN: 0.0000 mg | Freq: Four times a day (QID) | INTRAMUSCULAR | Status: DC

## 2016-10-25 MED ORDER — THIAMINE HCL 100 MG/ML IJ SOLN: 100.0000 mg | Freq: Every day | INTRAMUSCULAR | Status: DC

## 2016-10-25 MED ORDER — SODIUM CHLORIDE 0.9 % IV SOLN: Freq: Once | INTRAVENOUS | Status: DC

## 2016-10-25 MED ORDER — DEXTROSE 5 % IV SOLN
1.0000 g | Freq: Once | INTRAVENOUS | Status: DC
  Filled 2016-10-25: qty 10

## 2016-10-25 MED ORDER — LORAZEPAM 1 MG PO TABS: 1.0000 mg | ORAL_TABLET | Freq: Four times a day (QID) | ORAL | Status: AC | PRN

## 2016-10-25 MED ORDER — SODIUM CHLORIDE 0.9 % IV SOLN
50.0000 ug/h | INTRAVENOUS | Status: DC
  Administered 2016-10-25 – 2016-10-28 (×5): 50 ug/h via INTRAVENOUS
  Filled 2016-10-25 (×9): qty 1

## 2016-10-25 MED ORDER — PANTOPRAZOLE SODIUM 40 MG IV SOLR
40.0000 mg | Freq: Once | INTRAVENOUS | Status: AC
  Administered 2016-10-25: 40 mg via INTRAVENOUS
  Filled 2016-10-25: qty 40

## 2016-10-25 MED ORDER — SODIUM CHLORIDE 0.9 % IV SOLN
80.0000 mg | Freq: Once | INTRAVENOUS | Status: AC
  Administered 2016-10-26: 03:00:00 80 mg via INTRAVENOUS
  Filled 2016-10-25: qty 80

## 2016-10-25 MED ORDER — PANTOPRAZOLE SODIUM 40 MG IV SOLR
40.0000 mg | Freq: Two times a day (BID) | INTRAVENOUS | Status: DC
  Administered 2016-10-29 – 2016-10-30 (×3): 40 mg via INTRAVENOUS
  Filled 2016-10-25 (×2): qty 40

## 2016-10-25 MED ORDER — VITAMIN B-1 100 MG PO TABS: 100.0000 mg | ORAL_TABLET | Freq: Every day | ORAL | Status: DC

## 2016-10-25 MED ORDER — DEXTROSE 5 % IV SOLN
10.0000 mg | Freq: Once | INTRAVENOUS | Status: AC
  Administered 2016-10-25: 10 mg via INTRAVENOUS
  Filled 2016-10-25: qty 1

## 2016-10-25 MED ORDER — OCTREOTIDE LOAD VIA INFUSION
50.0000 ug | Freq: Once | INTRAVENOUS | Status: AC
  Administered 2016-10-25: 50 ug via INTRAVENOUS
  Filled 2016-10-25: qty 25

## 2016-10-25 MED ORDER — LORAZEPAM 2 MG/ML IJ SOLN: 0.0000 mg | Freq: Four times a day (QID) | INTRAMUSCULAR | Status: AC

## 2016-10-25 MED ORDER — ACETAMINOPHEN 325 MG PO TABS: 650.0000 mg | ORAL_TABLET | Freq: Four times a day (QID) | ORAL | Status: DC | PRN

## 2016-10-25 MED ORDER — LORAZEPAM 2 MG/ML IJ SOLN: 0.0000 mg | Freq: Two times a day (BID) | INTRAMUSCULAR | Status: DC

## 2016-10-25 MED ORDER — ACETAMINOPHEN 650 MG RE SUPP: 650.0000 mg | Freq: Four times a day (QID) | RECTAL | Status: DC | PRN

## 2016-10-25 MED ORDER — SODIUM CHLORIDE 0.9 % IV SOLN
INTRAVENOUS | Status: AC
  Administered 2016-10-26: 07:00:00 via INTRAVENOUS

## 2016-10-25 MED ORDER — FOLIC ACID 1 MG PO TABS
1.0000 mg | ORAL_TABLET | Freq: Every day | ORAL | Status: DC
  Administered 2016-10-27 – 2016-10-30 (×4): 1 mg via ORAL
  Filled 2016-10-25 (×4): qty 1

## 2016-10-25 MED ORDER — VITAMIN B-1 100 MG PO TABS
100.0000 mg | ORAL_TABLET | Freq: Every day | ORAL | Status: DC
  Administered 2016-10-27 – 2016-10-30 (×4): 100 mg via ORAL
  Filled 2016-10-25 (×4): qty 1

## 2016-10-25 MED ORDER — SODIUM CHLORIDE 0.9 % IV SOLN
8.0000 mg/h | INTRAVENOUS | Status: DC
  Administered 2016-10-26 – 2016-10-28 (×6): 8 mg/h via INTRAVENOUS
  Filled 2016-10-25 (×9): qty 80

## 2016-10-25 MED ORDER — SODIUM CHLORIDE 0.9 % IV SOLN: 10.0000 mL/h | Freq: Once | INTRAVENOUS | Status: DC

## 2016-10-25 MED ORDER — ONDANSETRON HCL 4 MG PO TABS: 4.0000 mg | ORAL_TABLET | Freq: Four times a day (QID) | ORAL | Status: DC | PRN

## 2016-10-25 NOTE — ED Triage Notes (Signed)
PT C/O ABDOMINAL PAIN WITH BLACK, WATERY STOOL SINCE YESTERDAY. PT STS TODAY HE VOMITED BLOOD. PT DENIES BLOOD THINNER USE, BUT STS HE HAS HX OF LIVER CIRRHOSIS.

## 2016-10-25 NOTE — H&P (Signed)
History and Physical    Peter Nelson VHQ:[id number] DOB: November 15, 1950 DOA: 10/25/2016  PCP: Sharon Seller, NP  Patient coming from: Home.  Chief Complaint: Hematemesis.  HPI: Peter Nelson is a 66 y.o. male with history of hepatocellular carcinoma status post microwave ablation in March 2017, hepatitis C, cirrhosis of the liver and alcohol abuse presents because of hematemesis. Patient states over the last 2 hours before presenting to the ER patient had 3 episodes of frank hematemesis. Patient initially also has some abdominal discomfort, which has resolved. Denies any chest pain or shortness of breath. Patient has been noticing 2 days of melanotic stool. Denies using any NSAIDs and aspirin but continues to drink alcohol. Hemoglobin the ER has dropped at least by 5 g from October 2017. Today it is around 7. On-call gastroenterologist Dr. Pincus Sanes has been consulted. Patient is placed on Protonix and octreotide and antibiotics. Patient will be admitted for acute GI bleed.   ED Course: Patient was started on Protonix and octreotide infusion. Type and screen ordered. Gastroenterologist Dr. Pincus Sanes consulted.  Review of Systems: As per HPI, rest all negative.   Past Medical History:  Diagnosis Date  . Alcohol abuse   . Allergy   . Arthritis   . Asthma   . Cancer ([place])    liver  . Hepatitis C   . Hepatocellular carcinoma ([place])   . Hyperplastic colon polyp   . Internal hemorrhoids   . Shortness of breath dyspnea    with activity and anxiety  . Ulcerative colitis ([place])   . Wears glasses     Past Surgical History:  Procedure Laterality Date  . SKIN GRAFT  1991   Left Hand     reports that he has been smoking Cigarettes.  He has a 27.50 pack-year smoking history. He has never used smokeless tobacco. He reports that he drinks alcohol. He reports that he does not use drugs.  Allergies  Allergen Reactions  . Aspirin Other (See Comments)    Does not take because of hep c     Family History  Problem Relation Age of Onset  . Hypertension Mother   . Diabetes Mother   . Heart disease Mother   . Alzheimer's disease Mother   . Diabetes Sister   . Diabetes Father   . Cancer Brother     Liver  . Colon cancer Neg Hx   . Esophageal cancer Neg Hx   . Stomach cancer Neg Hx   . Rectal cancer Neg Hx     Prior to Admission medications   Medication Sig Start Date End Date Taking? Authorizing Provider  vitamin B-12 (CYANOCOBALAMIN) 1000 MCG tablet Take 1,000 mcg by mouth daily.   Yes Historical Provider, MD  vitamin C (ASCORBIC ACID) 500 MG tablet Take 500 mg by mouth daily.   Yes Historical Provider, MD  furosemide (LASIX) 40 MG tablet Take 1 tablet (40 mg total) by mouth daily. Patient not taking: Reported on 10/25/2016 08/10/16   Jerene Bears, MD  sildenafil (VIAGRA) 25 MG tablet Take 1 tablet (25 mg total) by mouth daily as needed for erectile dysfunction. Patient not taking: Reported on 10/25/2016 10/01/16   [name], FNP  spironolactone (ALDACTONE) 100 MG tablet Take 1 tablet (100 mg total) by mouth daily. Patient not taking: Reported on 10/25/2016 08/10/16   Jerene Bears, MD    Physical Exam: Vitals:   10/25/16 1920 10/25/16 2325 10/25/16 2330  BP: 123/70 138/78   Pulse:  106 82 87  Resp: 16 17 13   Temp: 98.3 F (36.8 C)    TempSrc: Oral    SpO2: 100% 100% 100%  Weight: 60.8 kg (134 lb)    Height: 5' 6"  (1.676 m)        Constitutional: Moderately built and nourished. Vitals:   10/25/16 1920 10/25/16 2325 10/25/16 2330  BP: 123/70 138/78   Pulse: 106 82 87  Resp: 16 17 13   Temp: 98.3 F (36.8 C)    TempSrc: Oral    SpO2: 100% 100% 100%  Weight: 60.8 kg (134 lb)    Height: 5' 6"  (1.676 m)     Eyes: Anicteric no pallor. ENMT: No discharge from the ears eyes nose or mouth. Neck: No mass felt. No JVD appreciated. Respiratory: No rhonchi or crepitations. Cardiovascular: S1-S2 regular no murmurs appreciated. Abdomen: Soft nontender  bowel sounds present. Musculoskeletal: No edema. No joint effusion. Skin: No rash. Skin appears warm. Neurologic: Alert awake oriented to time place and person. Moves all extremities. Psychiatric: Appears normal. Normal affect.   Labs on Admission: I have personally reviewed following labs and imaging studies  CBC:  Recent Labs Lab 10/25/16 1939  WBC 6.2  HGB 7.9*  HCT 23.4*  MCV 104.0*  PLT 94*   Basic Metabolic Panel:  Recent Labs Lab 10/25/16 1939  NA 139  K 3.5  CL 107  CO2 25  GLUCOSE 105*  BUN 34*  CREATININE 0.95  CALCIUM 7.9*   GFR: Estimated Creatinine Clearance: 66.7 mL/min (by C-G formula based on SCr of 0.95 mg/dL). Liver Function Tests:  Recent Labs Lab 10/25/16 1939  AST 127*  ALT 45  ALKPHOS 81  BILITOT 3.0*  PROT 6.4*  ALBUMIN 2.4*   No results for input(s): LIPASE, AMYLASE in the last 168 hours. No results for input(s): AMMONIA in the last 168 hours. Coagulation Profile:  Recent Labs Lab 10/25/16 1939  INR 1.61   Cardiac Enzymes: No results for input(s): CKTOTAL, CKMB, CKMBINDEX, TROPONINI in the last 168 hours. BNP (last 3 results) No results for input(s): PROBNP in the last 8760 hours. HbA1C: No results for input(s): HGBA1C in the last 72 hours. CBG: No results for input(s): GLUCAP in the last 168 hours. Lipid Profile: No results for input(s): CHOL, HDL, LDLCALC, TRIG, CHOLHDL, LDLDIRECT in the last 72 hours. Thyroid Function Tests: No results for input(s): TSH, T4TOTAL, FREET4, T3FREE, THYROIDAB in the last 72 hours. Anemia Panel: No results for input(s): VITAMINB12, FOLATE, FERRITIN, TIBC, IRON, RETICCTPCT in the last 72 hours. Urine analysis:    Component Value Date/Time   COLORURINE AMBER (A) 10/01/2016 1001   APPEARANCEUR CLEAR 10/01/2016 1001   LABSPEC >=1.030 10/01/2016 1006   PHURINE 5.5 10/01/2016 1006   GLUCOSEU 100 (A) 10/01/2016 1006   HGBUR LARGE (A) 10/01/2016 1006   BILIRUBINUR MODERATE (A) 10/01/2016  1006   KETONESUR NEGATIVE 10/01/2016 1006   PROTEINUR 100 (A) 10/01/2016 1006   UROBILINOGEN 4.0 (H) 10/01/2016 1006   NITRITE NEGATIVE 10/01/2016 1006   LEUKOCYTESUR NEGATIVE 10/01/2016 1006   Sepsis Labs: @LABRCNTIP (procalcitonin:4,lacticidven:4) )No results found for this or any previous visit (from the past 240 hour(s)).   Radiological Exams on Admission: Dg Chest Portable 1 View  Result Date: 10/25/2016 CLINICAL DATA:  Acute onset of hematemesis.  Initial encounter. EXAM: PORTABLE CHEST 1 VIEW COMPARISON:  Chest radiograph from 12/26/2015 FINDINGS: The lungs are well-aerated and clear. There is no evidence of focal opacification, pleural effusion or pneumothorax. The cardiomediastinal silhouette is within normal  limits. No acute osseous abnormalities are seen. IMPRESSION: No acute cardiopulmonary process seen. Electronically Signed   By: Garald Balding M.D.   On: 10/25/2016 22:10     Assessment/Plan Principal Problem:   Acute GI bleeding Active Problems:   Alcohol abuse   Chronic hepatitis C without hepatic coma (HCC)   Hepatic cirrhosis (HCC)   Hepatocellular carcinoma (HCC)   Acute blood loss anemia    1. Acute GI bleeding - patient has had EGD and colonoscopy in February 2017 which showed Schatzki's ring and sessile polyp and hemorrhoids. At this time patient has been placed on Protonix octreotide and ceftriaxone. Follow CBC. Patient will be kept nothing by mouth in anticipation of EGD. Gastroenterologist Dr. Pincus Sanes has been consulted. 2. Acute blood loss anemia - follow CBC. Any further decline in hemoglobin will transfuse. 3. Alcohol abuse - patient is placed on CIWA protocol. 4. Thrombocytopenia probably secondary to cirrhosis and alcohol abuse - follow CBC. 5. Cirrhosis with mild coagulopathy - patient did receive vitamin K in the ER. The patient on ceftriaxone for GI bleed. 6. Hepatocellular carcinoma being followed by Dr. Burr Medico oncologist. 7. History of hepatitis C  being followed by infectious disease.   DVT prophylaxis: SCDs. Code Status: Full code.  Family Communication: Discussed with patient.  Disposition Plan: Home.  Consults called: Copywriter, advertising.  Admission status: Inpatient.    Rise Patience MD [facility] Pager [phone].  If 7PM-7AM, please contact night-coverage [email or web address] Password [facility]  10/25/2016, 11:47 PM

## 2016-10-25 NOTE — ED Notes (Signed)
This RN stuck pt for IV unsuccessfully

## 2016-10-25 NOTE — ED Provider Notes (Addendum)
[place] DEPT Provider Note   CSN: [id number] Arrival date & time: 10/25/16  [facility]     History   Chief Complaint Chief Complaint  Patient presents with  . Hematemesis  . Blood In Stools    HPI Peter Nelson is a 66 y.o. male.  HPI 66 y.o. male with medical history significant of HCV, hepatocellular carcinoma stage 1 s/p ablation, cirrhosis of the liver due to h/o EtOH abuse and HCV comes in with cc of bloody stools and emesis. PT reports that he started having dark tarry stools 2 days ago, and today he had 5 + episodes of bloody emesis. PT is having no chest pain, dib, dizziness. Increased fatigue. PT admits to daily alcohol drinking, 1 pint. Last use was earlier today. No hx of detox.   ROS 10 Systems reviewed and are negative for acute change except as noted in the HPI.      Past Medical History:  Diagnosis Date  . Alcohol abuse   . Allergy   . Arthritis   . Asthma   . Cancer ([place])    liver  . Hepatitis C   . Hepatocellular carcinoma ([place])   . Hyperplastic colon polyp   . Internal hemorrhoids   . Shortness of breath dyspnea    with activity and anxiety  . Ulcerative colitis ([place])   . Wears glasses     Patient Active Problem List   Diagnosis Date Noted  . Enlarged prostate on rectal examination 10/01/2016  . Erectile dysfunction 10/01/2016  . Abscess of right lower leg 03/15/2016  . Eczema 01/26/2016  . Hepatocellular carcinoma ([place]) 11/13/2015  . Memory loss 11/10/2015  . Alcoholic cirrhosis of liver without ascites ([place]) 11/10/2015  . Hepatitis, viral 11/10/2015  . BRBPR (bright red blood per rectum) 10/30/2015  . Hepatic cirrhosis ([place]) 10/30/2015  . Chronic hepatitis C without hepatic coma ([place]) 09/17/2015  . Stress at home 08/26/2015  . Alcohol abuse 07/28/2015  . Tobacco use disorder 07/28/2015  . Syncope 05/08/2015    Past Surgical History:  Procedure Laterality Date  . SKIN GRAFT  1991   Left Hand       Home Medications     Prior to Admission medications   Medication Sig Start Date End Date Taking? Authorizing Provider  vitamin B-12 (CYANOCOBALAMIN) 1000 MCG tablet Take 1,000 mcg by mouth daily.   Yes Historical Provider, MD  vitamin C (ASCORBIC ACID) 500 MG tablet Take 500 mg by mouth daily.   Yes Historical Provider, MD  furosemide (LASIX) 40 MG tablet Take 1 tablet (40 mg total) by mouth daily. Patient not taking: Reported on 10/25/2016 08/10/16   Jerene Bears, MD  sildenafil (VIAGRA) 25 MG tablet Take 1 tablet (25 mg total) by mouth daily as needed for erectile dysfunction. Patient not taking: Reported on 10/25/2016 10/01/16   [name], FNP  spironolactone (ALDACTONE) 100 MG tablet Take 1 tablet (100 mg total) by mouth daily. Patient not taking: Reported on 10/25/2016 08/10/16   Jerene Bears, MD    Family History Family History  Problem Relation Age of Onset  . Hypertension Mother   . Diabetes Mother   . Heart disease Mother   . Alzheimer's disease Mother   . Diabetes Sister   . Diabetes Father   . Cancer Brother     Liver  . Colon cancer Neg Hx   . Esophageal cancer Neg Hx   . Stomach cancer Neg Hx   . Rectal cancer Neg  Hx     Social History Social History  Substance Use Topics  . Smoking status: Current Every Day Smoker    Packs/day: 0.50    Years: 55.00    Types: Cigarettes  . Smokeless tobacco: Never Used     Comment: cutting back  . Alcohol use 0.0 oz/week     Comment: "a 1/2 pint a day" for more than 30 years, average 1 pint a day, cut back lately      Allergies   Aspirin   Review of Systems Review of Systems  ROS 10 Systems reviewed and are negative for acute change except as noted in the HPI.     Physical Exam Updated Vital Signs BP 123/70 (BP Location: Left Arm)   Pulse 106   Temp 98.3 F (36.8 C) (Oral)   Resp 16   Ht 5' 6"  (1.676 m)   Wt 134 lb (60.8 kg)   SpO2 100%   BMI 21.63 kg/m   Physical Exam  Constitutional: He is oriented to person,  place, and time. He appears well-developed.  HENT:  Head: Normocephalic and atraumatic.  Eyes: Conjunctivae and EOM are normal. Pupils are equal, round, and reactive to light. Scleral icterus is present.  Neck: Normal range of motion. Neck supple.  Cardiovascular: Normal rate and regular rhythm.   Pulmonary/Chest: Effort normal and breath sounds normal.  Abdominal: Soft. Bowel sounds are normal. He exhibits no distension. There is no tenderness. There is no rebound and no guarding.  Neurological: He is alert and oriented to person, place, and time.  Skin: Skin is warm.  Nursing note and vitals reviewed.    ED Treatments / Results  Labs (all labs ordered are listed, but only abnormal results are displayed) Labs Reviewed  COMPREHENSIVE METABOLIC PANEL - Abnormal; Notable for the following:       Result Value   Glucose, Bld 105 (*)    BUN 34 (*)    Calcium 7.9 (*)    Total Protein 6.4 (*)    Albumin 2.4 (*)    AST 127 (*)    Total Bilirubin 3.0 (*)    All other components within normal limits  CBC - Abnormal; Notable for the following:    RBC 2.25 (*)    Hemoglobin 7.9 (*)    HCT 23.4 (*)    MCV 104.0 (*)    MCH 35.1 (*)    Platelets 94 (*)    All other components within normal limits  PROTIME-INR - Abnormal; Notable for the following:    Prothrombin Time 19.3 (*)    All other components within normal limits  I-STAT CG4 LACTIC ACID, ED - Abnormal; Notable for the following:    Lactic Acid, Venous 2.28 (*)    All other components within normal limits  CBC  CBC  CBC  POC OCCULT BLOOD, ED  TYPE AND SCREEN  PREPARE FRESH FROZEN PLASMA  PREPARE RBC (CROSSMATCH)    EKG  EKG Interpretation None       Radiology Dg Chest Portable 1 View  Result Date: 10/25/2016 CLINICAL DATA:  Acute onset of hematemesis.  Initial encounter. EXAM: PORTABLE CHEST 1 VIEW COMPARISON:  Chest radiograph from 12/26/2015 FINDINGS: The lungs are well-aerated and clear. There is no evidence of  focal opacification, pleural effusion or pneumothorax. The cardiomediastinal silhouette is within normal limits. No acute osseous abnormalities are seen. IMPRESSION: No acute cardiopulmonary process seen. Electronically Signed   By: Garald Balding M.D.   On: 10/25/2016 22:10  Procedures Procedures (including critical care time)  CRITICAL CARE Performed by: Varney Biles   Total critical care time: 45 minutes  Critical care time was exclusive of separately billable procedures and treating other patients.  Critical care was necessary to treat or prevent imminent or life-threatening deterioration.  Critical care was time spent personally by me on the following activities: development of treatment plan with patient and/or surrogate as well as nursing, discussions with consultants, evaluation of patient's response to treatment, examination of patient, obtaining history from patient or surrogate, ordering and performing treatments and interventions, ordering and review of laboratory studies, ordering and review of radiographic studies, pulse oximetry and re-evaluation of patient's condition.    Medications Ordered in ED Medications  octreotide (SANDOSTATIN) 2 mcg/mL load via infusion 50 mcg (50 mcg Intravenous Bolus from Bag 10/25/16 2255)    And  octreotide (SANDOSTATIN) 500 mcg in sodium chloride 0.9 % 250 mL (2 mcg/mL) infusion (50 mcg/hr Intravenous New Bag/Given 10/25/16 2254)  0.9 %  sodium chloride infusion ( Intravenous New Bag/Given 10/25/16 2259)  cefTRIAXone (ROCEPHIN) 1 g in dextrose 5 % 50 mL IVPB (not administered)  phytonadione (VITAMIN K) 10 mg in dextrose 5 % 50 mL IVPB (not administered)  0.9 %  sodium chloride infusion (not administered)  0.9 %  sodium chloride infusion (not administered)  LORazepam (ATIVAN) injection 0-4 mg (not administered)    Followed by  LORazepam (ATIVAN) injection 0-4 mg (not administered)  thiamine (VITAMIN B-1) tablet 100 mg (not administered)     Or  thiamine (B-1) injection 100 mg (not administered)  pantoprazole (PROTONIX) injection 40 mg (40 mg Intravenous Given 10/25/16 2300)     Initial Impression / Assessment and Plan / ED Course  I have reviewed the triage vital signs and the nursing notes.  Pertinent labs & imaging results that were available during my care of the patient were reviewed by me and considered in my medical decision making (see chart for details).  Clinical Course as of Oct 25 2306  Mon Oct 25, 2016  2249 Critical care called. Pt is hemodynamically stable, not actively bleeding  - so they recommend step down bed for now.  Spoke with [facility]. They recommend Vit K , 2 units of FFP. We will give 1 units of PRBC. They will be contacted if pt decompensates. Hospitalist to admit.  Results from the ER workup discussed with the patient face to face and all questions answered to the best of my ability.   [AN]    Clinical Course User Index [AN] Varney Biles, MD    I personally performed the services described in this documentation, which was scribed in my presence. The recorded information has been reviewed and is accurate.  DDx includes: Esophagitis Mallory Weiss tear [place]  Variceal bleeding PUD/Gastritis/ulcers Diverticular bleed Colon cancer Rectal bleed Internal hemorrhoids External hemorrhoids   Pt comes in with cc of GI bleed. Pt's upper and lower endoscopy from 2017 reviewed - at that time no source of UGIB was uncovered. PT had internal hemorrhoids. Pt has large Hb drop and elevated BUN. We are concerned for UGIB and we will start octreotide and protonix, the former as a drip. WE will give antibiotics. Pt typed and screened. Pt will need icu admission.  Final Clinical Impressions(s) / ED Diagnoses   Final diagnoses:  Upper GI bleeding  Acute blood loss anemia    New Prescriptions New Prescriptions   No medications on file     Varney Biles, MD  10/25/16 [place], MD 10/25/16 2308

## 2016-10-25 NOTE — ED Notes (Signed)
Dr. Kathrynn Humble states to increase Acuity Level to 2

## 2016-10-26 ENCOUNTER — Inpatient Hospital Stay (HOSPITAL_COMMUNITY): Payer: Medicare HMO | Admitting: Registered Nurse

## 2016-10-26 ENCOUNTER — Inpatient Hospital Stay (HOSPITAL_COMMUNITY): Payer: Medicare HMO

## 2016-10-26 ENCOUNTER — Encounter (HOSPITAL_COMMUNITY)
Admission: EM | Disposition: A | Discharge status: Home / Self Care | Payer: Self-pay | Source: Home / Self Care | Attending: Internal Medicine

## 2016-10-26 ENCOUNTER — Encounter (HOSPITAL_COMMUNITY): Payer: Self-pay | Admitting: *Deleted

## 2016-10-26 DIAGNOSIS — K920 Hematemesis: Secondary | ICD-10-CM

## 2016-10-26 DIAGNOSIS — I8500 Esophageal varices without bleeding: Secondary | ICD-10-CM

## 2016-10-26 DIAGNOSIS — K922 Gastrointestinal hemorrhage, unspecified: Secondary | ICD-10-CM

## 2016-10-26 DIAGNOSIS — K7031 Alcoholic cirrhosis of liver with ascites: Principal | ICD-10-CM

## 2016-10-26 DIAGNOSIS — F1010 Alcohol abuse, uncomplicated: Secondary | ICD-10-CM

## 2016-10-26 DIAGNOSIS — D62 Acute posthemorrhagic anemia: Secondary | ICD-10-CM

## 2016-10-26 HISTORY — PX: ESOPHAGOGASTRODUODENOSCOPY (EGD) WITH PROPOFOL: SHX5813

## 2016-10-26 LAB — GLUCOSE, CAPILLARY: Glucose-Capillary: 95 mg/dL (ref 65–99)

## 2016-10-26 LAB — HEMOGLOBIN AND HEMATOCRIT, BLOOD
HCT: 23.8 % — ABNORMAL LOW (ref 39.0–52.0)
HCT: 23.5 % — ABNORMAL LOW (ref 39.0–52.0)
HEMATOCRIT: 23.6 % — AB (ref 39.0–52.0)
HEMOGLOBIN: 8.1 g/dL — AB (ref 13.0–17.0)
Hemoglobin: 8.3 g/dL — ABNORMAL LOW (ref 13.0–17.0)
Hemoglobin: 8.1 g/dL — ABNORMAL LOW (ref 13.0–17.0)

## 2016-10-26 LAB — BASIC METABOLIC PANEL
ANION GAP: 7 (ref 5–15)
BUN: 38 mg/dL — ABNORMAL HIGH (ref 6–20)
CALCIUM: 7.5 mg/dL — AB (ref 8.9–10.3)
CO2: 24 mmol/L (ref 22–32)
CREATININE: 0.96 mg/dL (ref 0.61–1.24)
Chloride: 109 mmol/L (ref 101–111)
GFR calc Af Amer: >60 mL/min (ref >60)
Glucose, Bld: 102 mg/dL — ABNORMAL HIGH (ref 65–99)
Potassium: 3.9 mmol/L (ref 3.5–5.1)
Sodium: 140 mmol/L (ref 135–145)

## 2016-10-26 LAB — CBG MONITORING, ED
GLUCOSE-CAPILLARY: 77 mg/dL (ref 65–99)
GLUCOSE-CAPILLARY: 76 mg/dL (ref 65–99)
GLUCOSE-CAPILLARY: 85 mg/dL (ref 65–99)

## 2016-10-26 LAB — CBC
HCT: 21.2 % — ABNORMAL LOW (ref 39.0–52.0)
HCT: 22 % — ABNORMAL LOW (ref 39.0–52.0)
Hemoglobin: 7.3 g/dL — ABNORMAL LOW (ref 13.0–17.0)
Hemoglobin: 7.7 g/dL — ABNORMAL LOW (ref 13.0–17.0)
MCH: 35.3 pg — ABNORMAL HIGH (ref 26.0–34.0)
MCH: 35 pg — ABNORMAL HIGH (ref 26.0–34.0)
MCHC: 34.4 g/dL (ref 30.0–36.0)
MCHC: 35 g/dL (ref 30.0–36.0)
MCV: 102.4 fL — ABNORMAL HIGH (ref 78.0–100.0)
MCV: 100 fL (ref 78.0–100.0)
PLATELETS: 61 10*3/uL — AB (ref 150–400)
Platelets: 67 10*3/uL — ABNORMAL LOW (ref 150–400)
RBC: 2.07 MIL/uL — ABNORMAL LOW (ref 4.22–5.81)
RBC: 2.2 MIL/uL — ABNORMAL LOW (ref 4.22–5.81)
RDW: 16.8 % — AB (ref 11.5–15.5)
RDW: 18.7 % — ABNORMAL HIGH (ref 11.5–15.5)
WBC: 6 10*3/uL (ref 4.0–10.5)
WBC: 5.6 10*3/uL (ref 4.0–10.5)

## 2016-10-26 LAB — HEPATIC FUNCTION PANEL
ALT: 38 U/L (ref 17–63)
AST: 106 U/L — ABNORMAL HIGH (ref 15–41)
Albumin: 2.1 g/dL — ABNORMAL LOW (ref 3.5–5.0)
Alkaline Phosphatase: 66 U/L (ref 38–126)
BILIRUBIN DIRECT: 1.1 mg/dL — AB (ref 0.1–0.5)
BILIRUBIN INDIRECT: 1.5 mg/dL — AB (ref 0.3–0.9)
Total Bilirubin: 2.6 mg/dL — ABNORMAL HIGH (ref 0.3–1.2)
Total Protein: 5.6 g/dL — ABNORMAL LOW (ref 6.5–8.1)

## 2016-10-26 LAB — PREPARE RBC (CROSSMATCH)

## 2016-10-26 LAB — MRSA PCR SCREENING: MRSA by PCR: NEGATIVE

## 2016-10-26 SURGERY — ESOPHAGOGASTRODUODENOSCOPY (EGD) WITH PROPOFOL
Anesthesia: Monitor Anesthesia Care

## 2016-10-26 MED ORDER — LIDOCAINE 2% (20 MG/ML) 5 ML SYRINGE
INTRAMUSCULAR | Status: DC | PRN
  Administered 2016-10-26: 100 mg via INTRAVENOUS

## 2016-10-26 MED ORDER — LIDOCAINE 2% (20 MG/ML) 5 ML SYRINGE
INTRAMUSCULAR | Status: AC
  Filled 2016-10-26: qty 5

## 2016-10-26 MED ORDER — PROPOFOL 10 MG/ML IV BOLUS
INTRAVENOUS | Status: AC
  Filled 2016-10-26: qty 40

## 2016-10-26 MED ORDER — PROPOFOL 500 MG/50ML IV EMUL
INTRAVENOUS | Status: DC | PRN
  Administered 2016-10-26: 140 ug/kg/min via INTRAVENOUS

## 2016-10-26 MED ORDER — SODIUM CHLORIDE 0.9 % IV SOLN: Freq: Once | INTRAVENOUS | Status: DC

## 2016-10-26 MED ORDER — MORPHINE SULFATE (PF) 2 MG/ML IV SOLN
1.0000 mg | INTRAVENOUS | Status: DC | PRN
  Administered 2016-10-26 – 2016-10-29 (×5): 1 mg via INTRAVENOUS
  Filled 2016-10-26 (×5): qty 1

## 2016-10-26 MED ORDER — PROPOFOL 10 MG/ML IV BOLUS
INTRAVENOUS | Status: DC | PRN
Start: 2016-10-26 — End: 2016-10-26
  Administered 2016-10-26 (×3): 30 mg via INTRAVENOUS

## 2016-10-26 MED ORDER — PROPOFOL 10 MG/ML IV BOLUS
INTRAVENOUS | Status: AC
  Filled 2016-10-26: qty 20

## 2016-10-26 MED ORDER — DEXTROSE 5 % IV SOLN
2.0000 g | Freq: Every day | INTRAVENOUS | Status: DC
  Administered 2016-10-26 – 2016-10-29 (×5): 2 g via INTRAVENOUS
  Filled 2016-10-26 (×6): qty 2

## 2016-10-26 MED ORDER — LACTATED RINGERS IV SOLN
INTRAVENOUS | Status: DC
  Administered 2016-10-26: 1000 mL via INTRAVENOUS

## 2016-10-26 SURGICAL SUPPLY — 15 items

## 2016-10-26 NOTE — Op Note (Addendum)
[facility] Patient Name: Cormick Sheffield Procedure Date: 10/26/2016 MRN: [id number] Attending MD: Willaim Rayas. Rukaya Kleinschmidt MD, MD Date of Birth: 07/12/1951 CSN: [id number] Age: 66 Admit Type: Outpatient Procedure:                Upper GI endoscopy Indications:              Hematemesis, history of cirrhosis, history of HCC,                            active alcohol abuse Providers:                Viviann Spare P. Nancye Grumbine MD, MD, Omelia Blackwater RN,                            RN, Kandice Robinsons, Technician Referring MD:              Medicines:                Monitored Anesthesia Care Complications:            No immediate complications. Estimated blood loss:                            Minimal. Estimated Blood Loss:     Estimated blood loss was minimal. Procedure:                Pre-Anesthesia Assessment:                           - Prior to the procedure, a History and Physical                            was performed, and patient medications and                            allergies were reviewed. The patient's tolerance of                            previous anesthesia was also reviewed. The risks                            and benefits of the procedure and the sedation                            options and risks were discussed with the patient.                            All questions were answered, and informed consent                            was obtained. Prior Anticoagulants: The patient has                            taken no previous anticoagulant or antiplatelet  agents. ASA Grade Assessment: III - A patient with                            severe systemic disease. After reviewing the risks                            and benefits, the patient was deemed in                            satisfactory condition to undergo the procedure.                           After obtaining informed consent, the endoscope was                            passed  under direct vision. Throughout the                            procedure, the patient's blood pressure, pulse, and                            oxygen saturations were monitored continuously. The                            Endoscope was introduced through the mouth, and                            advanced to the second part of duodenum. The upper                            GI endoscopy was accomplished without difficulty.                            The patient tolerated the procedure well. Scope In: Scope Out: Findings:      Esophagogastric landmarks were identified: the Z-line was found at 36       cm, the gastroesophageal junction was found at 36 cm and the upper       extent of the gastric folds was found at 38 cm from the incisors.      A 2 cm hiatal hernia was present.      LA Grade B (one or more mucosal breaks greater than 5 mm, not extending       between the tops of two mucosal folds) esophagitis with no bleeding was       found at the GEJ and lower esophagus.      Two columns of modetrately sized esophageal varices were found in the       middle third of the esophagus and in the lower third of the esophagus.       On initial intubation of the esophagus there was no active bleeding,       however during the procedure active bleeding was witnessed from the base       of one of the varix at 36cm from the incisors, with a punctum noted. One       band was placed at this  particular site to achieve hemostasis, and then       3 additional bands were placed to deflate the varices. Four bands were       successfully placed in total. There was no bleeding at the end of the       procedure.      The entire examined stomach was normal. No gastric varices appreciated.      The duodenal bulb and second portion of the duodenum were normal. Impression:               - Esophagogastric landmarks identified.                           - 2 cm hiatal hernia.                           - LA Grade B  esophagitis.                           - Esophageal varices with active bleeding and I                            suspect the most likely cause of his symptoms.                            Banded x 4.                           - Normal stomach.                           - Normal duodenal bulb and second portion of the                            duodenum. Moderate Sedation:      No moderate sedation, case performed with MAC Recommendation:           - Return patient to step down unit for ongoing care.                           - NPO for now                           - Continue present medications (IV protonix and IV                            octreotide infusion)                           - Continue ceftriaxone                           - Serial Hgb, monitor for recurrent bleeding.                            Please contact us with symptoms concerning for  recurrent bleeding                           - Monitor for alcohol withdrawal                           - GI service will continue to follow Procedure Code(s):        --- Professional ---                           [phone], Esophagogastroduodenoscopy, flexible,                            transoral; with band ligation of esophageal/gastric                            varices Diagnosis Code(s):        --- Professional ---                           K44.9, Diaphragmatic hernia without obstruction or                            gangrene                           K20.9, Esophagitis, unspecified                           I85.00, Esophageal varices without bleeding                           K92.0, Hematemesis CPT copyright 2016 [employer]. All rights reserved. The codes documented in this report are preliminary and upon coder review may  be revised to meet current compliance requirements. Viviann Spare P. Janan Bogie MD, MD 10/26/2016 2:59:33 PM This report has been signed electronically. Number of Addenda: 0

## 2016-10-26 NOTE — ED Notes (Signed)
WILL TRANSPORT PT TO 2W 1237-1. AAOX4. PT IN NO APPARENT DISTRESS. IVF INFUSING W/O PAIN OR SWELLING.THE OPPORTUNITY TO ASK QUESTIONS WAS PROVIDED.

## 2016-10-26 NOTE — ED Notes (Signed)
Endoscopy called and states that will be coming to get patient around 1230-1245.  Will need to hold ED room for patient to return to once they finish due to patient not having bed assignment yet.

## 2016-10-26 NOTE — ED Notes (Signed)
Bed: OI51 Expected date:  Expected time:  Means of arrival:  Comments: ENDO patient

## 2016-10-26 NOTE — Transfer of Care (Signed)
Immediate Anesthesia Transfer of Care Note  Patient: Peter Nelson  Procedure(s) Performed: Procedure(s): ESOPHAGOGASTRODUODENOSCOPY (EGD) WITH PROPOFOL ([name])  Patient Location: PACU and Endoscopy Unit  Anesthesia Type:MAC  Level of Consciousness: awake, alert , oriented and patient cooperative  Airway & Oxygen Therapy: Patient Spontanous Breathing and Patient connected to nasal cannula oxygen  Post-op Assessment: Report given to RN, Post -op Vital signs reviewed and stable and Patient moving all extremities  Post vital signs: Reviewed and stable  Last Vitals:  Vitals:   10/26/16 1252 10/26/16 1429  BP: 127/67 (!) 163/90  Pulse: 82 100  Resp: 16 (!) 23  Temp: 36.8 C     Last Pain:  Vitals:   10/26/16 1429  TempSrc: Oral  PainSc:          Complications: No apparent anesthesia complications

## 2016-10-26 NOTE — Interval H&P Note (Signed)
History and Physical Interval Note:  10/26/2016 1:39 PM  Peter Nelson  has presented today for surgery, with the diagnosis of GI bleed  The various methods of treatment have been discussed with the patient and family. After consideration of risks, benefits and other options for treatment, the patient has consented to  Procedure(s): ESOPHAGOGASTRODUODENOSCOPY (EGD) WITH PROPOFOL ([name]) as a surgical intervention .  The patient's history has been reviewed, patient examined, no change in status, stable for surgery.  I have reviewed the patient's chart and labs.  Questions were answered to the patient's satisfaction.     [name]

## 2016-10-26 NOTE — Progress Notes (Signed)
PHARMACY NOTE -  ceftriaxone  Pharmacy has been assisting with dosing of cefriaxone for intra-abdominal infection. Dosage remains stable at 2gm IV q24h and need for further dosage adjustment appears unlikely at present.    Will sign off at this time.  Please reconsult if a change in clinical status warrants re-evaluation of dosage.  Dia Sitter, PharmD, BCPS 10/26/2016 10:01 AM

## 2016-10-26 NOTE — Progress Notes (Signed)
PROGRESS NOTE    Peter Nelson  DQQ:[id number] DOB: 08-31-1951 DOA: 10/25/2016 PCP: Sharon Seller, NP    Brief Narrative:  66 y.o. male with history of hepatocellular carcinoma status post microwave ablation in March 2017, hepatitis C, cirrhosis of the liver and alcohol abuse presents because of hematemesis. Patient states over the last 2 hours before presenting to the ER patient had 3 episodes of frank hematemesis. Patient initially also has some abdominal discomfort, which has resolved. Denies any chest pain or shortness of breath. Patient has been noticing 2 days of melanotic stool. Denies using any NSAIDs and aspirin but continues to drink alcohol. Hemoglobin the ER has dropped at least by 5 g from October 2017. Today it is around 7. On-call gastroenterologist Dr. Pincus Sanes has been consulted. Patient is placed on Protonix and octreotide and antibiotics. Patient will be admitted for acute GI bleed.   Assessment & Plan:   Principal Problem:   Acute GI bleeding Active Problems:   Alcohol abuse   Chronic hepatitis C without hepatic coma (HCC)   Hepatic cirrhosis (HCC)   Hepatocellular carcinoma (HCC)   Acute blood loss anemia   1. Acute GI bleeding secondary to variceal bleed 1. Pt is now s/p EGD on 1/9 which found esophageal varices which were acitvely bleeding and banded x 4.  2. GI recommends continued PPI gtt and octreotide infusion 3. Will repeat CBC in AM 4. GI to continue to folow 5. Pt stable at present 2. Acute blood loss anemia 1. Secondary to above 2. Will follow CBC in AM 3. Stable 3. Alcohol abuse -  1. patient is continued on CIWA protocol. 2. Stable at present 4. Thrombocytopenia  1. Likely secondary to etoh cirrhosis with etoh abuse 2. Repeat cBC in AM. 5. Cirrhosis with mild coagulopathy -  1. patient did receive vitamin K in the ER.  2. Continued on ceftriaxone for GI bleed. 6. Hepatocellular carcinoma  1. Patient followed by Dr. Burr Medico  oncologist. 2. Stable at present 7. History of hepatitis C  1. Continue as per ID as outpt 2. stable.  DVT prophylaxis: SCD's Code Status: Full Family Communication: Pt in room, family at bedside Disposition Plan: Uncertain at this time  Consultants:   GI  Procedures:   EGD 1/9  Antimicrobials: Anti-infectives    Start     Dose/Rate Route Frequency Ordered Stop   10/26/16 0015  cefTRIAXone (ROCEPHIN) 2 g in dextrose 5 % 50 mL IVPB     2 g 100 mL/hr over 30 Minutes Intravenous Daily at bedtime 10/26/16 0001     10/25/16 2245  cefTRIAXone (ROCEPHIN) 1 g in dextrose 5 % 50 mL IVPB  Status:  Discontinued     1 g 100 mL/hr over 30 Minutes Intravenous  Once 10/25/16 2231 10/26/16 0001       Subjective: No complaints  Objective: Vitals:   10/26/16 1700 10/26/16 1800 10/26/16 1954 10/26/16 2000  BP: (!) 172/84 (!) 157/87  (!) 160/71  Pulse: 94 91 (!) 107 99  Resp: 16 14 17 15   Temp:   98.3 F (36.8 C)   TempSrc:   Oral   SpO2: 95% 96% 97% 96%  Weight:      Height:        Intake/Output Summary (Last 24 hours) at 10/26/16 2050 Last data filed at 10/26/16 2000  Gross per 24 hour  Intake             2740 ml  Output  275 ml  Net             2465 ml   Filed Weights   10/25/16 1920 10/26/16 1645 10/26/16 1654  Weight: 60.8 kg (134 lb) 62.6 kg (138 lb 0.1 oz) 62.6 kg (138 lb 0.1 oz)    Examination:  General exam: Appears calm and comfortable  Respiratory system: Clear to auscultation. Respiratory effort normal. Cardiovascular system: S1 & S2 heard, RRR Gastrointestinal system: Abdomen is nondistended, soft and nontender. No organomegaly or masses felt. Normal bowel sounds heard. Central nervous system: Alert and oriented. No focal neurological deficits. Extremities: Symmetric 5 x 5 power. Skin: No rashes, lesions  Psychiatry: Judgement and insight appear normal. Mood & affect appropriate.   Data Reviewed: I have personally reviewed following labs  and imaging studies  CBC:  Recent Labs Lab 10/25/16 1939 10/26/16 0331 10/26/16 0647 10/26/16 1118 10/26/16 1622  WBC 6.2 6.0 5.6  --   --   HGB 7.9* 7.3* 7.7* 8.3* 8.1*  HCT 23.4* 21.2* 22.0* 23.8* 23.5*  MCV 104.0* 102.4* 100.0  --   --   PLT 94* 67* 61*  --   --    Basic Metabolic Panel:  Recent Labs Lab 10/25/16 1939 10/26/16 0339  NA 139 140  K 3.5 3.9  CL 107 109  CO2 25 24  GLUCOSE 105* 102*  BUN 34* 38*  CREATININE 0.95 0.96  CALCIUM 7.9* 7.5*   GFR: Estimated Creatinine Clearance: 67.9 mL/min (by C-G formula based on SCr of 0.96 mg/dL). Liver Function Tests:  Recent Labs Lab 10/25/16 1939 10/26/16 0339  AST 127* 106*  ALT 45 38  ALKPHOS 81 66  BILITOT 3.0* 2.6*  PROT 6.4* 5.6*  ALBUMIN 2.4* 2.1*   No results for input(s): LIPASE, AMYLASE in the last 168 hours. No results for input(s): AMMONIA in the last 168 hours. Coagulation Profile:  Recent Labs Lab 10/25/16 1939  INR 1.61   Cardiac Enzymes: No results for input(s): CKTOTAL, CKMB, CKMBINDEX, TROPONINI in the last 168 hours. BNP (last 3 results) No results for input(s): PROBNP in the last 8760 hours. HbA1C: No results for input(s): HGBA1C in the last 72 hours. CBG:  Recent Labs Lab 10/26/16 0851 10/26/16 1218 10/26/16 1632 10/26/16 1952  GLUCAP 77 76 85 95   Lipid Profile: No results for input(s): CHOL, HDL, LDLCALC, TRIG, CHOLHDL, LDLDIRECT in the last 72 hours. Thyroid Function Tests: No results for input(s): TSH, T4TOTAL, FREET4, T3FREE, THYROIDAB in the last 72 hours. Anemia Panel: No results for input(s): VITAMINB12, FOLATE, FERRITIN, TIBC, IRON, RETICCTPCT in the last 72 hours. Sepsis Labs:  Recent Labs Lab 10/25/16 2238  LATICACIDVEN 2.28*    Recent Results (from the past 240 hour(s))  MRSA PCR Screening     Status: None   Collection Time: 10/26/16  4:53 PM  Result Value Ref Range Status   MRSA by PCR NEGATIVE NEGATIVE Final    Comment:        The GeneXpert  MRSA Assay (FDA approved for NASAL specimens only), is one component of a comprehensive MRSA colonization surveillance program. It is not intended to diagnose MRSA infection nor to guide or monitor treatment for MRSA infections.      Radiology Studies: Dg Chest Port 1 View  Result Date: 10/26/2016 CLINICAL DATA:  66 y/o  M; sudden onset upper anterior chest pain. EXAM: PORTABLE CHEST 1 VIEW COMPARISON:  10/25/2016 chest radiograph FINDINGS: Low lung volumes. Hazy opacities of the lung bases may represent atelectasis or  possibly aspiration given recent EGD. Bones are unremarkable. No pneumothorax or effusion. IMPRESSION: Low lung volumes and hazy opacities at lung bases which may represent atelectasis or possibly aspiration. Electronically Signed   By: Kristine Garbe M.D.   On: 10/26/2016 20:45   Dg Chest Portable 1 View  Result Date: 10/25/2016 CLINICAL DATA:  Acute onset of hematemesis.  Initial encounter. EXAM: PORTABLE CHEST 1 VIEW COMPARISON:  Chest radiograph from 12/26/2015 FINDINGS: The lungs are well-aerated and clear. There is no evidence of focal opacification, pleural effusion or pneumothorax. The cardiomediastinal silhouette is within normal limits. No acute osseous abnormalities are seen. IMPRESSION: No acute cardiopulmonary process seen. Electronically Signed   By: Garald Balding M.D.   On: 10/25/2016 22:10    Scheduled Meds: . sodium chloride   Intravenous Once  . cefTRIAXone (ROCEPHIN)  IV  2 g Intravenous QHS  . folic acid  1 mg Oral Daily  . LORazepam  0-4 mg Intravenous Q6H   Followed by  . [START ON 10/28/2016] LORazepam  0-4 mg Intravenous Q12H  . multivitamin with minerals  1 tablet Oral Daily  . [START ON 10/29/2016] pantoprazole  40 mg Intravenous Q12H  . thiamine  100 mg Oral Daily   Or  . thiamine  100 mg Intravenous Daily   Continuous Infusions: . sodium chloride 50 mL/hr at 10/26/16 1800  . octreotide  (SANDOSTATIN)    IV infusion 50 mcg/hr  (10/26/16 2044)  . pantoprozole (PROTONIX) infusion 8 mg/hr (10/26/16 1800)     LOS: 1 day   CHIU, Orpah Melter, MD [facility] Pager [phone]  If 7PM-7AM, please contact night-coverage [email or web address] Password [email or web address] 10/26/2016, 8:50 PM

## 2016-10-26 NOTE — Anesthesia Preprocedure Evaluation (Addendum)
Anesthesia Evaluation  Patient identified by MRN, date of birth, ID band Patient awake    Reviewed: Allergy & Precautions, H&P , NPO status , Patient's Chart, lab work & pertinent test results  Airway Mallampati: II   Neck ROM: full    Dental   Pulmonary shortness of breath, asthma , Current Smoker,    breath sounds clear to auscultation       Cardiovascular negative cardio ROS   Rhythm:regular Rate:Normal     Neuro/Psych    GI/Hepatic PUD, (+) Cirrhosis     substance abuse  alcohol use, Hepatitis -GI bleed.   Endo/Other    Renal/GU      Musculoskeletal  (+) Arthritis ,   Abdominal   Peds  Hematology  (+) anemia ,   Anesthesia Other Findings   Reproductive/Obstetrics                             Anesthesia Physical Anesthesia Plan  ASA: III  Anesthesia Plan: MAC   Post-op Pain Management:    Induction: Intravenous  Airway Management Planned: Nasal Cannula  Additional Equipment:   Intra-op Plan:   Post-operative Plan:   Informed Consent: I have reviewed the patients History and Physical, chart, labs and discussed the procedure including the risks, benefits and alternatives for the proposed anesthesia with the patient or authorized representative who has indicated his/her understanding and acceptance.     Plan Discussed with: CRNA, Anesthesiologist and Surgeon  Anesthesia Plan Comments:         Anesthesia Quick Evaluation

## 2016-10-26 NOTE — H&P (View-Only) (Signed)
Consultation  Referring Provider: Karenann Cai Primary Care Physician:  Sharon Seller, NP Primary Gastroenterologist:  Dr.Pyrtle  Reason for Consultation:  Acute GI bleed with hematemesis  HPI: Peter Nelson is a 66 y.o. 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 He also has Hepatocellular CA for which he underwent microwave ablation per Dr Pascal Lux 12/2015. LOV 07/2016 at which time he was still actively drinking half a pint of gin per day. PT had EGD in 11/2015 for screening and did not have any esophageal or gastric varices or portal gastropathy noted at that time . Colonoscopy was also done at that same time with removal of one small sessile polyp which was hyperplastic and noted internal hemorrhoids. Patient presents to the emergency room last night after onset on Sunday, 10/24/2016 with black stools. He says he has several episodes of black stools and then at some point within 24 hours also started vomiting. He says initially when he vomited it was clear-looking and then became very dark. He did not not see any obvious red blood or clots but says that it was enough to scare him. Denies any chest pain heartburn or indigestion. He denies any abdominal pain though notes that his abdomen has been larger over the past couple of months. He also feels that he is been retaining some fluid in his legs. He does admit that he is continuing to drink gin but says that he's done as of today. On arrival to the emergency room, he was hemodynamically stable. Labs show a significant drop in hemoglobin from baseline of 13 down to 7.9 last night on arrival. He has been transfused 1 unit of blood. Last hemoglobin at 7 AM this morning was 7.7. Other labs reviewed showing platelets of 61,000, INR 1.6, BUN 34, creatinine 0.95, T bili of 3.0 AST 127 and albumin 2.4  MRI of abdomen done October 2017 showed posttreatment related changes to the prior microwave ablation to the lesion in the right  lobe of the liver no evidence to suggest residual/recurrent disease or definite metastatic disease, did note a large volume of ascites and cirrhosis.    Past Medical History:  Diagnosis Date  . Alcohol abuse   . Allergy   . Arthritis   . Asthma   . Cancer ([place])    liver  . Hepatitis C   . Hepatocellular carcinoma ([place])   . Hyperplastic colon polyp   . Internal hemorrhoids   . Shortness of breath dyspnea    with activity and anxiety  . Ulcerative colitis ([place])   . Wears glasses     Past Surgical History:  Procedure Laterality Date  . SKIN GRAFT  1991   Left Hand    Prior to Admission medications   Medication Sig Start Date End Date Taking? Authorizing Provider  vitamin B-12 (CYANOCOBALAMIN) 1000 MCG tablet Take 1,000 mcg by mouth daily.   Yes Historical Provider, MD  vitamin C (ASCORBIC ACID) 500 MG tablet Take 500 mg by mouth daily.   Yes Historical Provider, MD  furosemide (LASIX) 40 MG tablet Take 1 tablet (40 mg total) by mouth daily. Patient not taking: Reported on 10/25/2016 08/10/16   Jerene Bears, MD  sildenafil (VIAGRA) 25 MG tablet Take 1 tablet (25 mg total) by mouth daily as needed for erectile dysfunction. Patient not taking: Reported on 10/25/2016 10/01/16   [name], FNP  spironolactone (ALDACTONE) 100 MG tablet Take 1 tablet (100 mg total) by mouth  daily. Patient not taking: Reported on 10/25/2016 08/10/16   Jerene Bears, MD    Current Facility-Administered Medications  Medication Dose Route Frequency Provider Last Rate Last Dose  . 0.9 %  sodium chloride infusion   Intravenous Continuous Rise Patience, MD 50 mL/hr at 10/26/16 [phone]    . 0.9 %  sodium chloride infusion   Intravenous Once Nelida Meuse III, MD      . acetaminophen (TYLENOL) tablet 650 mg  650 mg Oral Q6H PRN Rise Patience, MD       Or  . acetaminophen (TYLENOL) suppository 650 mg  650 mg Rectal Q6H PRN Rise Patience, MD      . cefTRIAXone (ROCEPHIN) 2 g in dextrose 5 %  50 mL IVPB  2 g Intravenous QHS Rise Patience, MD 100 mL/hr at 10/26/16 0009 2 g at 10/26/16 0009  . folic acid (FOLVITE) tablet 1 mg  1 mg Oral Daily Rise Patience, MD      . LORazepam (ATIVAN) injection 0-4 mg  0-4 mg Intravenous Q6H Rise Patience, MD       Followed by  . [START ON 10/28/2016] LORazepam (ATIVAN) injection 0-4 mg  0-4 mg Intravenous Q12H Rise Patience, MD      . LORazepam (ATIVAN) tablet 1 mg  1 mg Oral Q6H PRN Rise Patience, MD       Or  . LORazepam (ATIVAN) injection 1 mg  1 mg Intravenous Q6H PRN Rise Patience, MD      . multivitamin with minerals tablet 1 tablet  1 tablet Oral Daily Rise Patience, MD      . octreotide (SANDOSTATIN) 500 mcg in sodium chloride 0.9 % 250 mL (2 mcg/mL) infusion  50 mcg/hr Intravenous Continuous Varney Biles, MD 25 mL/hr at 10/25/16 2254 50 mcg/hr at 10/25/16 2254  . ondansetron (ZOFRAN) tablet 4 mg  4 mg Oral Q6H PRN Rise Patience, MD       Or  . ondansetron [facility]) injection 4 mg  4 mg Intravenous Q6H PRN Rise Patience, MD      . pantoprazole (PROTONIX) 80 mg in sodium chloride 0.9 % 250 mL (0.32 mg/mL) infusion  8 mg/hr Intravenous Continuous Rise Patience, MD 25 mL/hr at 10/26/16 0656 8 mg/hr at 10/26/16 0656  . [START ON 10/29/2016] pantoprazole (PROTONIX) injection 40 mg  40 mg Intravenous Q12H Rise Patience, MD      . thiamine (VITAMIN B-1) tablet 100 mg  100 mg Oral Daily Rise Patience, MD       Or  . thiamine (B-1) injection 100 mg  100 mg Intravenous Daily Rise Patience, MD       Current Outpatient Prescriptions  Medication Sig Dispense Refill  . vitamin B-12 (CYANOCOBALAMIN) 1000 MCG tablet Take 1,000 mcg by mouth daily.    . vitamin C (ASCORBIC ACID) 500 MG tablet Take 500 mg by mouth daily.    . furosemide (LASIX) 40 MG tablet Take 1 tablet (40 mg total) by mouth daily. (Patient not taking: Reported on 10/25/2016) 30 tablet 1  . sildenafil (VIAGRA) 25 MG  tablet Take 1 tablet (25 mg total) by mouth daily as needed for erectile dysfunction. (Patient not taking: Reported on 10/25/2016) 10 tablet 0  . spironolactone (ALDACTONE) 100 MG tablet Take 1 tablet (100 mg total) by mouth daily. (Patient not taking: Reported on 10/25/2016) 30 tablet 1    Allergies as of 10/25/2016 - Review  Complete 10/25/2016  Allergen Reaction Noted  . Aspirin Other (See Comments) 07/18/2014    Family History  Problem Relation Age of Onset  . Hypertension Mother   . Diabetes Mother   . Heart disease Mother   . Alzheimer's disease Mother   . Diabetes Sister   . Diabetes Father   . Cancer Brother     Liver  . Colon cancer Neg Hx   . Esophageal cancer Neg Hx   . Stomach cancer Neg Hx   . Rectal cancer Neg Hx     Social History   Social History  . Marital status: Married    Spouse name: [name]  . Number of children: 36  . Years of education: [name]   Occupational History  . Not on file.   Social History Main Topics  . Smoking status: Current Every Day Smoker    Packs/day: 0.50    Years: 55.00    Types: Cigarettes  . Smokeless tobacco: Never Used     Comment: cutting back  . Alcohol use 0.0 oz/week     Comment: "a 1/2 pint a day" for more than 30 years, average 1 pint a day, cut back lately   . Drug use: No  . Sexual activity: Not on file   Other Topics Concern  . Not on file   Social History Narrative   Married Dec 22nd, 2016 to [place]   Has #9 children    Review of Systems: Pertinent positive and negative review of systems were noted in the above HPI section.  All other review of systems was otherwise negative.  Physical Exam: Vital signs in last 24 hours: Temp:  [98 F (36.7 C)-98.3 F (36.8 C)] 98.1 F (36.7 C) (01/09 0517) Pulse Rate:  [76-106] 88 (01/09 0830) Resp:  [12-19] 17 (01/09 0830) BP: (113-153)/(62-92) 129/71 (01/09 0830) SpO2:  [95 %-100 %] 100 % (01/09 0830) Weight:  [134 lb (60.8 kg)] 134 lb (60.8 kg) (01/08 1920)     General:   Alert,  Well-developed, well-nourished,AA male  pleasant and cooperative in NAD Head:  Normocephalic and atraumatic. Eyes:  Sclera early  icterus.   Conjunctiva pink. Ears:  Normal auditory acuity. Nose:  No deformity, discharge,  or lesions. Mouth:  No deformity or lesions.   Neck:  Supple; no masses or thyromegaly. Lungs:  Clear throughout to auscultation.   No wheezes, crackles, or rhonchi. Heart:  Regular rate and rhythm; no murmurs, clicks, rubs,  or gallops. Abdomen:  Soft,nontender, BS active, no palpable mass or hepatosplenomegaly he does have non-tense ascites.   Rectal:  Deferred  Msk:  Symmetrical without gross deformities. . Pulses:  Normal pulses noted. Extremities:  Without clubbing 2+ edema to the shins Neurologic:  Alert and  oriented x4;  grossly normal neurologically. Skin:  Intact without significant lesions or rashes.. Psych:  Alert and cooperative. Normal mood and affect.  Intake/Output from previous day: 01/08 0701 - 01/09 0700 In: 475 [I.V.:425; IV Piggyback:50] Out: -  Intake/Output this shift: No intake/output data recorded.  Lab Results:  Recent Labs  10/25/16 1939 10/26/16 0331 10/26/16 0647  WBC 6.2 6.0 5.6  HGB 7.9* 7.3* 7.7*  HCT 23.4* 21.2* 22.0*  PLT 94* 67* 61*   BMET  Recent Labs  10/25/16 1939 10/26/16 0339  NA 139 140  K 3.5 3.9  CL 107 109  CO2 25 24  GLUCOSE 105* 102*  BUN 34* 38*  CREATININE 0.95 0.96  CALCIUM 7.9* 7.5*   LFT  Recent Labs  10/26/16 0339  PROT 5.6*  ALBUMIN 2.1*  AST 106*  ALT 38  ALKPHOS 66  BILITOT 2.6*  BILIDIR 1.1*  IBILI 1.5*   PT/INR  Recent Labs  10/25/16 1939  LABPROT 19.3*  INR 1.61   Hepatitis Panel No results for input(s): HEPBSAG, HCVAB, HEPAIGM, HEPBIGM in the last 72 hours.    IMPRESSION:  #92  66 year old African-American male with decompensated cirrhosis secondary to EtOH and hepatitis C which has been complicated by hepatocellular carcinoma for which she  underwent microwave ablation March 2017 with good results. Patient has had increase in ascites over the past few months, and now presents with an acute upper GI bleed. He did not have evidence of varices at the time of last EGD February 2017 however cannot rule out variceal hemorrhage, bleed secondary to portal gastropathy or less likely peptic ulcer disease or Mallory-Weiss tear. #2 coagulopathy progressive #3 thrombocytopenia #4 hypoalbuminemia #5 continued EtOH abuse  Plan; Patient has been admitted, will go to step down. Continue octreotide Continue IV PPI infusion until post EGD Serial hemoglobins and transfuse for hemoglobin less than 7 Nothing by mouth Have scheduled for EGD with Dr. Havery Moros early this afternoon. Further plans pending results of EGD.  High risk for EtOH withdrawal Will need follow-up ultrasound prior to discharge Discussion with patient this morning regarding his ongoing alcohol abuse and need for complete abstinence to slow any further decompensation of his severe liver disease.      Kaymen Adrian  10/26/2016, 9:13 AM

## 2016-10-26 NOTE — Progress Notes (Signed)
Pharmacy Antibiotic Note  Peter Nelson is a 66 y.o. male admitted on 10/25/2016 with Intra-abdominal infection .  Pharmacy has been consulted for Ceftriaxone dosing.  Plan: Ceftriaxone 2gm iv q24hr   Height: 5' 6"  (167.6 cm) Weight: 134 lb (60.8 kg) IBW/kg (Calculated) : 63.8  Temp (24hrs), Avg:98.1 F (36.7 C), Min:98 F (36.7 C), Max:98.3 F (36.8 C)   Recent Labs Lab 10/25/16 1939 10/25/16 2238 10/26/16 0331 10/26/16 0339  WBC 6.2  --  6.0  --   CREATININE 0.95  --   --  0.96  LATICACIDVEN  --  2.28*  --   --     Estimated Creatinine Clearance: 66 mL/min (by C-G formula based on SCr of 0.96 mg/dL).    Allergies  Allergen Reactions  . Aspirin Other (See Comments)    Does not take because of hep c    Antimicrobials this admission: Ceftriaxone 1/8 >>  Dose adjustments this admission: -  Microbiology results: pending  Thank you for allowing pharmacy to be a part of this patient's care.  Peter Nelson 10/26/2016 5:44 AM

## 2016-10-26 NOTE — Progress Notes (Signed)
Per GI Consult Note and pt's attending MD, holding off on second blood transfusion. Most recent Hgb 8.1.   Will continue to monitor.

## 2016-10-26 NOTE — Consult Note (Signed)
Consultation  Referring Provider: Karenann Cai Primary Care Physician:  Sharon Seller, NP Primary Gastroenterologist:  Dr.Pyrtle  Reason for Consultation:  Acute GI bleed with hematemesis  HPI: Peter Nelson is a 66 y.o. 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 He also has Hepatocellular CA for which he underwent microwave ablation per Dr Pascal Lux 12/2015. LOV 07/2016 at which time he was still actively drinking half a pint of gin per day. PT had EGD in 11/2015 for screening and did not have any esophageal or gastric varices or portal gastropathy noted at that time . Colonoscopy was also done at that same time with removal of one small sessile polyp which was hyperplastic and noted internal hemorrhoids. Patient presents to the emergency room last night after onset on Sunday, 10/24/2016 with black stools. He says he has several episodes of black stools and then at some point within 24 hours also started vomiting. He says initially when he vomited it was clear-looking and then became very dark. He did not not see any obvious red blood or clots but says that it was enough to scare him. Denies any chest pain heartburn or indigestion. He denies any abdominal pain though notes that his abdomen has been larger over the past couple of months. He also feels that he is been retaining some fluid in his legs. He does admit that he is continuing to drink gin but says that he's done as of today. On arrival to the emergency room, he was hemodynamically stable. Labs show a significant drop in hemoglobin from baseline of 13 down to 7.9 last night on arrival. He has been transfused 1 unit of blood. Last hemoglobin at 7 AM this morning was 7.7. Other labs reviewed showing platelets of 61,000, INR 1.6, BUN 34, creatinine 0.95, T bili of 3.0 AST 127 and albumin 2.4  MRI of abdomen done October 2017 showed posttreatment related changes to the prior microwave ablation to the lesion in the right  lobe of the liver no evidence to suggest residual/recurrent disease or definite metastatic disease, did note a large volume of ascites and cirrhosis.    Past Medical History:  Diagnosis Date  . Alcohol abuse   . Allergy   . Arthritis   . Asthma   . Cancer ([place])    liver  . Hepatitis C   . Hepatocellular carcinoma ([place])   . Hyperplastic colon polyp   . Internal hemorrhoids   . Shortness of breath dyspnea    with activity and anxiety  . Ulcerative colitis ([place])   . Wears glasses     Past Surgical History:  Procedure Laterality Date  . SKIN GRAFT  1991   Left Hand    Prior to Admission medications   Medication Sig Start Date End Date Taking? Authorizing Provider  vitamin B-12 (CYANOCOBALAMIN) 1000 MCG tablet Take 1,000 mcg by mouth daily.   Yes Historical Provider, MD  vitamin C (ASCORBIC ACID) 500 MG tablet Take 500 mg by mouth daily.   Yes Historical Provider, MD  furosemide (LASIX) 40 MG tablet Take 1 tablet (40 mg total) by mouth daily. Patient not taking: Reported on 10/25/2016 08/10/16   Jerene Bears, MD  sildenafil (VIAGRA) 25 MG tablet Take 1 tablet (25 mg total) by mouth daily as needed for erectile dysfunction. Patient not taking: Reported on 10/25/2016 10/01/16   [name], FNP  spironolactone (ALDACTONE) 100 MG tablet Take 1 tablet (100 mg total) by mouth  daily. Patient not taking: Reported on 10/25/2016 08/10/16   Jerene Bears, MD    Current Facility-Administered Medications  Medication Dose Route Frequency Provider Last Rate Last Dose  . 0.9 %  sodium chloride infusion   Intravenous Continuous Rise Patience, MD 50 mL/hr at 10/26/16 [phone]    . 0.9 %  sodium chloride infusion   Intravenous Once Nelida Meuse III, MD      . acetaminophen (TYLENOL) tablet 650 mg  650 mg Oral Q6H PRN Rise Patience, MD       Or  . acetaminophen (TYLENOL) suppository 650 mg  650 mg Rectal Q6H PRN Rise Patience, MD      . cefTRIAXone (ROCEPHIN) 2 g in dextrose 5 %  50 mL IVPB  2 g Intravenous QHS Rise Patience, MD 100 mL/hr at 10/26/16 0009 2 g at 10/26/16 0009  . folic acid (FOLVITE) tablet 1 mg  1 mg Oral Daily Rise Patience, MD      . LORazepam (ATIVAN) injection 0-4 mg  0-4 mg Intravenous Q6H Rise Patience, MD       Followed by  . [START ON 10/28/2016] LORazepam (ATIVAN) injection 0-4 mg  0-4 mg Intravenous Q12H Rise Patience, MD      . LORazepam (ATIVAN) tablet 1 mg  1 mg Oral Q6H PRN Rise Patience, MD       Or  . LORazepam (ATIVAN) injection 1 mg  1 mg Intravenous Q6H PRN Rise Patience, MD      . multivitamin with minerals tablet 1 tablet  1 tablet Oral Daily Rise Patience, MD      . octreotide (SANDOSTATIN) 500 mcg in sodium chloride 0.9 % 250 mL (2 mcg/mL) infusion  50 mcg/hr Intravenous Continuous Varney Biles, MD 25 mL/hr at 10/25/16 2254 50 mcg/hr at 10/25/16 2254  . ondansetron (ZOFRAN) tablet 4 mg  4 mg Oral Q6H PRN Rise Patience, MD       Or  . ondansetron [facility]) injection 4 mg  4 mg Intravenous Q6H PRN Rise Patience, MD      . pantoprazole (PROTONIX) 80 mg in sodium chloride 0.9 % 250 mL (0.32 mg/mL) infusion  8 mg/hr Intravenous Continuous Rise Patience, MD 25 mL/hr at 10/26/16 0656 8 mg/hr at 10/26/16 0656  . [START ON 10/29/2016] pantoprazole (PROTONIX) injection 40 mg  40 mg Intravenous Q12H Rise Patience, MD      . thiamine (VITAMIN B-1) tablet 100 mg  100 mg Oral Daily Rise Patience, MD       Or  . thiamine (B-1) injection 100 mg  100 mg Intravenous Daily Rise Patience, MD       Current Outpatient Prescriptions  Medication Sig Dispense Refill  . vitamin B-12 (CYANOCOBALAMIN) 1000 MCG tablet Take 1,000 mcg by mouth daily.    . vitamin C (ASCORBIC ACID) 500 MG tablet Take 500 mg by mouth daily.    . furosemide (LASIX) 40 MG tablet Take 1 tablet (40 mg total) by mouth daily. (Patient not taking: Reported on 10/25/2016) 30 tablet 1  . sildenafil (VIAGRA) 25 MG  tablet Take 1 tablet (25 mg total) by mouth daily as needed for erectile dysfunction. (Patient not taking: Reported on 10/25/2016) 10 tablet 0  . spironolactone (ALDACTONE) 100 MG tablet Take 1 tablet (100 mg total) by mouth daily. (Patient not taking: Reported on 10/25/2016) 30 tablet 1    Allergies as of 10/25/2016 - Review  Complete 10/25/2016  Allergen Reaction Noted  . Aspirin Other (See Comments) 07/18/2014    Family History  Problem Relation Age of Onset  . Hypertension Mother   . Diabetes Mother   . Heart disease Mother   . Alzheimer's disease Mother   . Diabetes Sister   . Diabetes Father   . Cancer Brother     Liver  . Colon cancer Neg Hx   . Esophageal cancer Neg Hx   . Stomach cancer Neg Hx   . Rectal cancer Neg Hx     Social History   Social History  . Marital status: Married    Spouse name: [name]  . Number of children: 12  . Years of education: [name]   Occupational History  . Not on file.   Social History Main Topics  . Smoking status: Current Every Day Smoker    Packs/day: 0.50    Years: 55.00    Types: Cigarettes  . Smokeless tobacco: Never Used     Comment: cutting back  . Alcohol use 0.0 oz/week     Comment: "a 1/2 pint a day" for more than 30 years, average 1 pint a day, cut back lately   . Drug use: No  . Sexual activity: Not on file   Other Topics Concern  . Not on file   Social History Narrative   Married Dec 22nd, 2016 to [place]   Has #9 children    Review of Systems: Pertinent positive and negative review of systems were noted in the above HPI section.  All other review of systems was otherwise negative.  Physical Exam: Vital signs in last 24 hours: Temp:  [98 F (36.7 C)-98.3 F (36.8 C)] 98.1 F (36.7 C) (01/09 0517) Pulse Rate:  [76-106] 88 (01/09 0830) Resp:  [12-19] 17 (01/09 0830) BP: (113-153)/(62-92) 129/71 (01/09 0830) SpO2:  [95 %-100 %] 100 % (01/09 0830) Weight:  [134 lb (60.8 kg)] 134 lb (60.8 kg) (01/08 1920)     General:   Alert,  Well-developed, well-nourished,AA male  pleasant and cooperative in NAD Head:  Normocephalic and atraumatic. Eyes:  Sclera early  icterus.   Conjunctiva pink. Ears:  Normal auditory acuity. Nose:  No deformity, discharge,  or lesions. Mouth:  No deformity or lesions.   Neck:  Supple; no masses or thyromegaly. Lungs:  Clear throughout to auscultation.   No wheezes, crackles, or rhonchi. Heart:  Regular rate and rhythm; no murmurs, clicks, rubs,  or gallops. Abdomen:  Soft,nontender, BS active, no palpable mass or hepatosplenomegaly he does have non-tense ascites.   Rectal:  Deferred  Msk:  Symmetrical without gross deformities. . Pulses:  Normal pulses noted. Extremities:  Without clubbing 2+ edema to the shins Neurologic:  Alert and  oriented x4;  grossly normal neurologically. Skin:  Intact without significant lesions or rashes.. Psych:  Alert and cooperative. Normal mood and affect.  Intake/Output from previous day: 01/08 0701 - 01/09 0700 In: 475 [I.V.:425; IV Piggyback:50] Out: -  Intake/Output this shift: No intake/output data recorded.  Lab Results:  Recent Labs  10/25/16 1939 10/26/16 0331 10/26/16 0647  WBC 6.2 6.0 5.6  HGB 7.9* 7.3* 7.7*  HCT 23.4* 21.2* 22.0*  PLT 94* 67* 61*   BMET  Recent Labs  10/25/16 1939 10/26/16 0339  NA 139 140  K 3.5 3.9  CL 107 109  CO2 25 24  GLUCOSE 105* 102*  BUN 34* 38*  CREATININE 0.95 0.96  CALCIUM 7.9* 7.5*   LFT  Recent Labs  10/26/16 0339  PROT 5.6*  ALBUMIN 2.1*  AST 106*  ALT 38  ALKPHOS 66  BILITOT 2.6*  BILIDIR 1.1*  IBILI 1.5*   PT/INR  Recent Labs  10/25/16 1939  LABPROT 19.3*  INR 1.61   Hepatitis Panel No results for input(s): HEPBSAG, HCVAB, HEPAIGM, HEPBIGM in the last 72 hours.    IMPRESSION:  #37  66 year old African-American male with decompensated cirrhosis secondary to EtOH and hepatitis C which has been complicated by hepatocellular carcinoma for which she  underwent microwave ablation March 2017 with good results. Patient has had increase in ascites over the past few months, and now presents with an acute upper GI bleed. He did not have evidence of varices at the time of last EGD February 2017 however cannot rule out variceal hemorrhage, bleed secondary to portal gastropathy or less likely peptic ulcer disease or Mallory-Weiss tear. #2 coagulopathy progressive #3 thrombocytopenia #4 hypoalbuminemia #5 continued EtOH abuse  Plan; Patient has been admitted, will go to step down. Continue octreotide Continue IV PPI infusion until post EGD Serial hemoglobins and transfuse for hemoglobin less than 7 Nothing by mouth Have scheduled for EGD with Dr. Havery Moros early this afternoon. Further plans pending results of EGD.  High risk for EtOH withdrawal Will need follow-up ultrasound prior to discharge Discussion with patient this morning regarding his ongoing alcohol abuse and need for complete abstinence to slow any further decompensation of his severe liver disease.      Amy Esterwood  10/26/2016, 9:13 AM

## 2016-10-26 NOTE — ED Notes (Addendum)
Per GI MD, hold off on administering any blood transfusions at this time.

## 2016-10-27 DIAGNOSIS — B182 Chronic viral hepatitis C: Secondary | ICD-10-CM

## 2016-10-27 DIAGNOSIS — K7030 Alcoholic cirrhosis of liver without ascites: Secondary | ICD-10-CM

## 2016-10-27 DIAGNOSIS — I8511 Secondary esophageal varices with bleeding: Secondary | ICD-10-CM

## 2016-10-27 DIAGNOSIS — C220 Liver cell carcinoma: Secondary | ICD-10-CM

## 2016-10-27 DIAGNOSIS — K922 Gastrointestinal hemorrhage, unspecified: Secondary | ICD-10-CM

## 2016-10-27 LAB — COMPREHENSIVE METABOLIC PANEL
ALT: 34 U/L (ref 17–63)
AST: 87 U/L — ABNORMAL HIGH (ref 15–41)
Albumin: 2.1 g/dL — ABNORMAL LOW (ref 3.5–5.0)
Alkaline Phosphatase: 67 U/L (ref 38–126)
Anion gap: 5 (ref 5–15)
BILIRUBIN TOTAL: 2.5 mg/dL — AB (ref 0.3–1.2)
BUN: 34 mg/dL — ABNORMAL HIGH (ref 6–20)
CHLORIDE: 113 mmol/L — AB (ref 101–111)
CO2: 22 mmol/L (ref 22–32)
Calcium: 7.5 mg/dL — ABNORMAL LOW (ref 8.9–10.3)
Creatinine, Ser: 1.09 mg/dL (ref 0.61–1.24)
GFR calc Af Amer: >60 mL/min (ref >60)
Glucose, Bld: 96 mg/dL (ref 65–99)
POTASSIUM: 3.6 mmol/L (ref 3.5–5.1)
Sodium: 140 mmol/L (ref 135–145)
TOTAL PROTEIN: 5.8 g/dL — AB (ref 6.5–8.1)

## 2016-10-27 LAB — GLUCOSE, CAPILLARY
GLUCOSE-CAPILLARY: 160 mg/dL — AB (ref 65–99)
GLUCOSE-CAPILLARY: 96 mg/dL (ref 65–99)
Glucose-Capillary: 163 mg/dL — ABNORMAL HIGH (ref 65–99)
Glucose-Capillary: 84 mg/dL (ref 65–99)
Glucose-Capillary: 88 mg/dL (ref 65–99)
Glucose-Capillary: 98 mg/dL (ref 65–99)

## 2016-10-27 LAB — HEMOGLOBIN AND HEMATOCRIT, BLOOD
HCT: 24.5 % — ABNORMAL LOW (ref 39.0–52.0)
HEMATOCRIT: 23.9 % — AB (ref 39.0–52.0)
HEMATOCRIT: 25 % — AB (ref 39.0–52.0)
HEMATOCRIT: 27.2 % — AB (ref 39.0–52.0)
HEMOGLOBIN: 8.2 g/dL — AB (ref 13.0–17.0)
HEMOGLOBIN: 8.7 g/dL — AB (ref 13.0–17.0)
HEMOGLOBIN: 9.2 g/dL — AB (ref 13.0–17.0)
Hemoglobin: 8.3 g/dL — ABNORMAL LOW (ref 13.0–17.0)

## 2016-10-27 MED ORDER — SODIUM CHLORIDE 0.9 % IV SOLN
INTRAVENOUS | Status: AC
  Administered 2016-10-27 – 2016-10-28 (×2): via INTRAVENOUS

## 2016-10-27 MED ORDER — VITAMIN K1 10 MG/ML IJ SOLN
5.0000 mg | Freq: Once | INTRAVENOUS | Status: AC
  Administered 2016-10-27: 5 mg via INTRAVENOUS
  Filled 2016-10-27: qty 0.5

## 2016-10-27 NOTE — Progress Notes (Signed)
PROGRESS NOTE    Peter Nelson  UJW:[id number] DOB: December 13, 1950 DOA: 10/25/2016 PCP: Peter Living, NP   Chief Complaint  Patient presents with  . Hematemesis  . Blood In Stools    Brief Narrative:  HPI on 10/22/2016 by Dr. Midge Minium Peter Nelson is a 66 y.o. male with history of hepatocellular carcinoma status post microwave ablation in March 2017, hepatitis C, cirrhosis of the liver and alcohol abuse presents because of hematemesis. Patient states over the last 2 hours before presenting to the ER patient had 3 episodes of frank hematemesis. Patient initially also has some abdominal discomfort, which has resolved. Denies any chest pain or shortness of breath. Patient has been noticing 2 days of melanotic stool. Denies using any NSAIDs and aspirin but continues to drink alcohol. Hemoglobin the ER has dropped at least by 5 g from October 2017. Today it is around 7. On-call gastroenterologist Dr. Caren Macadam has been consulted. Patient is placed on Protonix and octreotide and antibiotics. Patient will be admitted for acute GI bleed.  Assessment & Plan   Acute GI bleeding secondary to variceal bleed/ Acute blood loss anemia -Gastroenterology consulted and appreciated -S/p EGD on 10/27/2015, found esophageal varices, actively bleeding and banded x4 -Hemoglobin had dropped to 7.3, currently 8.7 -Placed on PPI GTT and octreotide infusion, GI changed PPI to twice daily, continue octreotide infusion for 48 hours -Opinion monitor CBC -Diet may be advanced slowly, started on full liquid diet -Patient on need repeat EGD with banding protocol in 2-4 weeks  Alcohol abuse -Continue CIWA protocol -Discussed cessation with patient -no active withdrawal  Thrombocytopenia -Likely secondary to alcoholic cirrhosis and abuse -Continue to monitor CBC  Cirrhosis with mild coagulopathy -Patient did receive vitamin K in the emergency department -Was placed on ceftriaxone for GI bleed -Given Vit K  today by GI  Hepatocellular carcinoma -Stable, followed by Dr.Feng, oncologist  Hepatitis C -Continue outpatient ID follow-up  DVT Prophylaxis  SCDs  Code Status: Full  Family Communication: None at bedside  Disposition Plan: Currently admitted. Continue to monitor in stepdown  Consultants Gastroenterology  Procedures  EGD  Antibiotics   Anti-infectives    Start     Dose/Rate Route Frequency Ordered Stop   10/26/16 0015  cefTRIAXone (ROCEPHIN) 2 g in dextrose 5 % 50 mL IVPB     2 g 100 mL/hr over 30 Minutes Intravenous Daily at bedtime 10/26/16 0001     10/25/16 2245  cefTRIAXone (ROCEPHIN) 1 g in dextrose 5 % 50 mL IVPB  Status:  Discontinued     1 g 100 mL/hr over 30 Minutes Intravenous  Once 10/25/16 2231 10/26/16 0001      Subjective:   Peter Nelson seen and examined today.  Patient feels very hungry today. Would like eat. Denies any dizziness,  abdominal pain, nausea or vomiting, denies any melena or hematochezia. Denies chest pain or shortness of breath.  Objective:   Vitals:   10/27/16 0500 10/27/16 0600 10/27/16 0800 10/27/16 1000  BP: (!) 180/90 (!) 128/55 (!) 152/73 (!) 151/86  Pulse: 89 81 86 87  Resp: 14 12 16 17   Temp:   97.5 F (36.4 C)   TempSrc:   Oral   SpO2: 98% 98% 97% 98%  Weight:      Height:        Intake/Output Summary (Last 24 hours) at 10/27/16 1052 Last data filed at 10/27/16 5621  Gross per 24 hour  Intake  2549.17 ml  Output              550 ml  Net          1999.17 ml   Filed Weights   10/26/16 1645 10/26/16 1654 10/27/16 0416  Weight: 62.6 kg (138 lb 0.1 oz) 62.6 kg (138 lb 0.1 oz) 62 kg (136 lb 11 oz)    Exam  General: Well developed, well nourished, NAD, appears stated age  HEENT: NCAT, mucous membranes moist.   Cardiovascular: S1 S2 auscultated, no rubs, murmurs or gallops. Regular rate and rhythm.  Respiratory: Clear to auscultation bilaterally with equal chest rise  Abdomen: Soft, nontender,  nondistended, + bowel sounds  Extremities: warm dry without cyanosis clubbing or edema  Neuro: AAOx3, nonfocal  Skin: Without rashes exudates or nodules  Psych: Normal affect and demeanor with intact judgement and insight   Data Reviewed: I have personally reviewed following labs and imaging studies  CBC:  Recent Labs Lab 10/25/16 1939 10/26/16 0331 10/26/16 0647 10/26/16 1118 10/26/16 1622 10/26/16 2220 10/27/16 0407 10/27/16 1014  WBC 6.2 6.0 5.6  --   --   --   --   --   HGB 7.9* 7.3* 7.7* 8.3* 8.1* 8.1* 8.2* 8.7*  HCT 23.4* 21.2* 22.0* 23.8* 23.5* 23.6* 23.9* 25.0*  MCV 104.0* 102.4* 100.0  --   --   --   --   --   PLT 94* 67* 61*  --   --   --   --   --    Basic Metabolic Panel:  Recent Labs Lab 10/25/16 1939 10/26/16 0339 10/27/16 0407  NA 139 140 140  K 3.5 3.9 3.6  CL 107 109 113*  CO2 25 24 22   GLUCOSE 105* 102* 96  BUN 34* 38* 34*  CREATININE 0.95 0.96 1.09  CALCIUM 7.9* 7.5* 7.5*   GFR: Estimated Creatinine Clearance: 59.3 mL/min (by C-G formula based on SCr of 1.09 mg/dL). Liver Function Tests:  Recent Labs Lab 10/25/16 1939 10/26/16 0339 10/27/16 0407  AST 127* 106* 87*  ALT 45 38 34  ALKPHOS 81 66 67  BILITOT 3.0* 2.6* 2.5*  PROT 6.4* 5.6* 5.8*  ALBUMIN 2.4* 2.1* 2.1*   No results for input(s): LIPASE, AMYLASE in the last 168 hours. No results for input(s): AMMONIA in the last 168 hours. Coagulation Profile:  Recent Labs Lab 10/25/16 1939  INR 1.61   Cardiac Enzymes: No results for input(s): CKTOTAL, CKMB, CKMBINDEX, TROPONINI in the last 168 hours. BNP (last 3 results) No results for input(s): PROBNP in the last 8760 hours. HbA1C: No results for input(s): HGBA1C in the last 72 hours. CBG:  Recent Labs Lab 10/26/16 1632 10/26/16 1952 10/27/16 0025 10/27/16 0303 10/27/16 0758  GLUCAP 85 95 98 88 84   Lipid Profile: No results for input(s): CHOL, HDL, LDLCALC, TRIG, CHOLHDL, LDLDIRECT in the last 72 hours. Thyroid  Function Tests: No results for input(s): TSH, T4TOTAL, FREET4, T3FREE, THYROIDAB in the last 72 hours. Anemia Panel: No results for input(s): VITAMINB12, FOLATE, FERRITIN, TIBC, IRON, RETICCTPCT in the last 72 hours. Urine analysis:    Component Value Date/Time   COLORURINE AMBER (A) 10/01/2016 1001   APPEARANCEUR CLEAR 10/01/2016 1001   LABSPEC >=1.030 10/01/2016 1006   PHURINE 5.5 10/01/2016 1006   GLUCOSEU 100 (A) 10/01/2016 1006   HGBUR LARGE (A) 10/01/2016 1006   BILIRUBINUR MODERATE (A) 10/01/2016 1006   KETONESUR NEGATIVE 10/01/2016 1006   PROTEINUR 100 (A) 10/01/2016 1006  UROBILINOGEN 4.0 (H) 10/01/2016 1006   NITRITE NEGATIVE 10/01/2016 1006   LEUKOCYTESUR NEGATIVE 10/01/2016 1006   Sepsis Labs: @LABRCNTIP (procalcitonin:4,lacticidven:4)  ) Recent Results (from the past 240 hour(s))  MRSA PCR Screening     Status: None   Collection Time: 10/26/16  4:53 PM  Result Value Ref Range Status   MRSA by PCR NEGATIVE NEGATIVE Final    Comment:        The GeneXpert MRSA Assay (FDA approved for NASAL specimens only), is one component of a comprehensive MRSA colonization surveillance program. It is not intended to diagnose MRSA infection nor to guide or monitor treatment for MRSA infections.       Radiology Studies: Dg Chest Port 1 View  Result Date: 10/26/2016 CLINICAL DATA:  66 y/o  M; sudden onset upper anterior chest pain. EXAM: PORTABLE CHEST 1 VIEW COMPARISON:  10/25/2016 chest radiograph FINDINGS: Low lung volumes. Hazy opacities of the lung bases may represent atelectasis or possibly aspiration given recent EGD. Bones are unremarkable. No pneumothorax or effusion. IMPRESSION: Low lung volumes and hazy opacities at lung bases which may represent atelectasis or possibly aspiration. Electronically Signed   By: [name] M.D.   On: 10/26/2016 20:45   Dg Chest Portable 1 View  Result Date: 10/25/2016 CLINICAL DATA:  Acute onset of hematemesis.  Initial  encounter. EXAM: PORTABLE CHEST 1 VIEW COMPARISON:  Chest radiograph from 12/26/2015 FINDINGS: The lungs are well-aerated and clear. There is no evidence of focal opacification, pleural effusion or pneumothorax. The cardiomediastinal silhouette is within normal limits. No acute osseous abnormalities are seen. IMPRESSION: No acute cardiopulmonary process seen. Electronically Signed   By: Roanna Raider M.D.   On: 10/25/2016 22:10     Scheduled Meds: . sodium chloride   Intravenous Once  . cefTRIAXone (ROCEPHIN)  IV  2 g Intravenous QHS  . folic acid  1 mg Oral Daily  . LORazepam  0-4 mg Intravenous Q6H   Followed by  . [START ON 10/28/2016] LORazepam  0-4 mg Intravenous Q12H  . multivitamin with minerals  1 tablet Oral Daily  . [START ON 10/29/2016] pantoprazole  40 mg Intravenous Q12H  . phytonadione (VITAMIN K) IV  5 mg Intravenous Once  . thiamine  100 mg Oral Daily   Or  . thiamine  100 mg Intravenous Daily   Continuous Infusions: . sodium chloride 50 mL/hr at 10/27/16 0437  . octreotide  (SANDOSTATIN)    IV infusion 50 mcg/hr (10/26/16 2044)  . pantoprozole (PROTONIX) infusion 8 mg/hr (10/27/16 0344)     LOS: 2 days   Time Spent in minutes   30 minutes  Jaxan Michel D.O. on 10/27/2016 at 10:52 AM  Between 7am to 7pm - Pager - [phone]  After 7pm go to [email or web address] - password [email or web address]  And look for the night coverage person covering for me after hours  [facility] Office  [phone]

## 2016-10-27 NOTE — Progress Notes (Signed)
Patient ID: Peter Nelson, male   DOB: 1951-08-30, 66 y.o.   MRN: [id number]    Progress Note   Subjective   Feels ok, denies  any chest pain,nausea, abd pain. No further bleeding. Hungry hgb 8.2 stable On Octreotide   Objective   Vital signs in last 24 hours: Temp:  [97.5 F (36.4 C)-98.4 F (36.9 C)] 97.5 F (36.4 C) (01/10 0800) Pulse Rate:  [81-107] 87 (01/10 1000) Resp:  [2-23] 17 (01/10 1000) BP: (127-180)/(55-93) 151/86 (01/10 1000) SpO2:  [95 %-100 %] 98 % (01/10 1000) Weight:  [136 lb 11 oz (62 kg)-138 lb 0.1 oz (62.6 kg)] 136 lb 11 oz (62 kg) (01/10 0416)   General:    AA male  in NAD, wife at bedside Heart:  Regular rate and rhythm; no murmurs Lungs: Respirations even and unlabored, lungs CTA bilaterally Abdomen:  Soft, nontender and nondistended. Normal bowel sounds. Extremities:  Without edema. Neurologic:  Alert and oriented,  grossly normal neurologically. Psych:  Cooperative. Normal mood and affect.  Intake/Output from previous day: 01/09 0701 - 01/10 0700 In: 2549.2 [I.V.:2449.2; IV Piggyback:100] Out: 550 [Urine:550] Intake/Output this shift: No intake/output data recorded.  Lab Results:  Recent Labs  10/25/16 1939 10/26/16 0331 10/26/16 0647  10/26/16 1622 10/26/16 2220 10/27/16 0407  WBC 6.2 6.0 5.6  --   --   --   --   HGB 7.9* 7.3* 7.7*  < > 8.1* 8.1* 8.2*  HCT 23.4* 21.2* 22.0*  < > 23.5* 23.6* 23.9*  PLT 94* 67* 61*  --   --   --   --   < > = values in this interval not displayed. BMET  Recent Labs  10/25/16 1939 10/26/16 0339 10/27/16 0407  NA 139 140 140  K 3.5 3.9 3.6  CL 107 109 113*  CO2 25 24 22   GLUCOSE 105* 102* 96  BUN 34* 38* 34*  CREATININE 0.95 0.96 1.09  CALCIUM 7.9* 7.5* 7.5*   LFT  Recent Labs  10/26/16 0339 10/27/16 0407  PROT 5.6* 5.8*  ALBUMIN 2.1* 2.1*  AST 106* 87*  ALT 38 34  ALKPHOS 66 67  BILITOT 2.6* 2.5*  BILIDIR 1.1*  --   IBILI 1.5*  --    PT/INR  Recent Labs  10/25/16 1939    LABPROT 19.3*  INR 1.61    Studies/Results: Dg Chest Port 1 View  Result Date: 10/26/2016 CLINICAL DATA:  66 y/o  M; sudden onset upper anterior chest pain. EXAM: PORTABLE CHEST 1 VIEW COMPARISON:  10/25/2016 chest radiograph FINDINGS: Low lung volumes. Hazy opacities of the lung bases may represent atelectasis or possibly aspiration given recent EGD. Bones are unremarkable. No pneumothorax or effusion. IMPRESSION: Low lung volumes and hazy opacities at lung bases which may represent atelectasis or possibly aspiration. Electronically Signed   By: Kristine Garbe M.D.   On: 10/26/2016 20:45   Dg Chest Portable 1 View  Result Date: 10/25/2016 CLINICAL DATA:  Acute onset of hematemesis.  Initial encounter. EXAM: PORTABLE CHEST 1 VIEW COMPARISON:  Chest radiograph from 12/26/2015 FINDINGS: The lungs are well-aerated and clear. There is no evidence of focal opacification, pleural effusion or pneumothorax. The cardiomediastinal silhouette is within normal limits. No acute osseous abnormalities are seen. IMPRESSION: No acute cardiopulmonary process seen. Electronically Signed   By: Garald Balding M.D.   On: 10/25/2016 22:10       Assessment / Plan:    #1 66 yo AA male alcoholic with HEPC, hx  hepatoma, and decompensated Cirrhosis with acute variceal bleed- Grade 4 varices with active bleeding at time of EGD yesterday - he is s/p banding x 4 Stable overnight, no active bleeding #2 anemia -secondary to acute blood loss #3 coagulopathy #4 active ETOH abuse- no overt withdrawal  Plan;Start full liquids Continue Octreotide x 48 hours Serial hgb's  Vit K x one today Will need repeat banding in 3-4 weeks   Principal Problem:   Acute GI bleeding Active Problems:   Alcohol abuse   Chronic hepatitis C without hepatic coma (HCC)   Hepatic cirrhosis (HCC)   Hepatocellular carcinoma (HCC)   Acute blood loss anemia     LOS: 2 days   Amy Esterwood  10/27/2016, 10:15 AM

## 2016-10-27 NOTE — Anesthesia Postprocedure Evaluation (Signed)
Anesthesia Post Note  Patient: Peter Nelson  Procedure(s) Performed: Procedure(s) (LRB): ESOPHAGOGASTRODUODENOSCOPY (EGD) WITH PROPOFOL ([name])  Patient location during evaluation: PACU Anesthesia Type: MAC Level of consciousness: awake and alert Pain management: pain level controlled Vital Signs Assessment: post-procedure vital signs reviewed and stable Respiratory status: spontaneous breathing, nonlabored ventilation, respiratory function stable and patient connected to nasal cannula oxygen Cardiovascular status: stable and blood pressure returned to baseline Anesthetic complications: no       Last Vitals:  Vitals:   10/27/16 0600 10/27/16 0800  BP: (!) 128/55 (!) 152/73  Pulse: 81 86  Resp: 12 16  Temp:  36.4 C    Last Pain:  Vitals:   10/27/16 0800  TempSrc: Oral  PainSc:                  [employer]

## 2016-10-28 LAB — GLUCOSE, CAPILLARY
GLUCOSE-CAPILLARY: 128 mg/dL — AB (ref 65–99)
GLUCOSE-CAPILLARY: 122 mg/dL — AB (ref 65–99)
GLUCOSE-CAPILLARY: 120 mg/dL — AB (ref 65–99)
Glucose-Capillary: 113 mg/dL — ABNORMAL HIGH (ref 65–99)
Glucose-Capillary: 89 mg/dL (ref 65–99)
Glucose-Capillary: 124 mg/dL — ABNORMAL HIGH (ref 65–99)

## 2016-10-28 LAB — HEMOGLOBIN AND HEMATOCRIT, BLOOD
HCT: 23.4 % — ABNORMAL LOW (ref 39.0–52.0)
HEMATOCRIT: 25.1 % — AB (ref 39.0–52.0)
HEMATOCRIT: 25.4 % — AB (ref 39.0–52.0)
HEMOGLOBIN: 8.4 g/dL — AB (ref 13.0–17.0)
HEMOGLOBIN: 7.9 g/dL — AB (ref 13.0–17.0)
Hemoglobin: 8.2 g/dL — ABNORMAL LOW (ref 13.0–17.0)

## 2016-10-28 LAB — COMPREHENSIVE METABOLIC PANEL
ALBUMIN: 2.1 g/dL — AB (ref 3.5–5.0)
ALT: 33 U/L (ref 17–63)
AST: 85 U/L — AB (ref 15–41)
Alkaline Phosphatase: 66 U/L (ref 38–126)
Anion gap: 4 — ABNORMAL LOW (ref 5–15)
BUN: 25 mg/dL — AB (ref 6–20)
CHLORIDE: 112 mmol/L — AB (ref 101–111)
CO2: 23 mmol/L (ref 22–32)
Calcium: 7.4 mg/dL — ABNORMAL LOW (ref 8.9–10.3)
Creatinine, Ser: 0.99 mg/dL (ref 0.61–1.24)
GFR calc Af Amer: >60 mL/min (ref >60)
GFR calc non Af Amer: >60 mL/min (ref >60)
GLUCOSE: 98 mg/dL (ref 65–99)
POTASSIUM: 3.4 mmol/L — AB (ref 3.5–5.1)
SODIUM: 139 mmol/L (ref 135–145)
Total Bilirubin: 2 mg/dL — ABNORMAL HIGH (ref 0.3–1.2)
Total Protein: 5.9 g/dL — ABNORMAL LOW (ref 6.5–8.1)

## 2016-10-28 LAB — PREPARE FRESH FROZEN PLASMA
UNIT DIVISION: 0
Unit division: 0

## 2016-10-28 LAB — CBC
HCT: 24.8 % — ABNORMAL LOW (ref 39.0–52.0)
Hemoglobin: 8.4 g/dL — ABNORMAL LOW (ref 13.0–17.0)
MCH: 34.9 pg — ABNORMAL HIGH (ref 26.0–34.0)
MCHC: 33.9 g/dL (ref 30.0–36.0)
MCV: 102.9 fL — ABNORMAL HIGH (ref 78.0–100.0)
PLATELETS: 86 10*3/uL — AB (ref 150–400)
RBC: 2.41 MIL/uL — AB (ref 4.22–5.81)
RDW: 19 % — AB (ref 11.5–15.5)
WBC: 6.1 10*3/uL (ref 4.0–10.5)

## 2016-10-28 LAB — PROTIME-INR
INR: 1.68
Prothrombin Time: 20 seconds — ABNORMAL HIGH (ref 11.4–15.2)

## 2016-10-28 MED ORDER — NADOLOL 20 MG PO TABS
20.0000 mg | ORAL_TABLET | Freq: Every day | ORAL | Status: DC
  Administered 2016-10-28 – 2016-10-30 (×3): 20 mg via ORAL
  Filled 2016-10-28 (×3): qty 1

## 2016-10-28 MED ORDER — GI COCKTAIL ~~LOC~~
30.0000 mL | Freq: Four times a day (QID) | ORAL | Status: DC | PRN
  Administered 2016-10-28: 30 mL via ORAL
  Filled 2016-10-28: qty 30

## 2016-10-28 MED ORDER — SUCRALFATE 1 GM/10ML PO SUSP
1.0000 g | Freq: Four times a day (QID) | ORAL | Status: DC
  Administered 2016-10-28 – 2016-10-30 (×9): 1 g via ORAL
  Filled 2016-10-28 (×9): qty 10

## 2016-10-28 MED ORDER — POTASSIUM CHLORIDE 20 MEQ/15ML (10%) PO SOLN
40.0000 meq | Freq: Once | ORAL | Status: AC
  Administered 2016-10-28: 40 meq via ORAL
  Filled 2016-10-28: qty 30

## 2016-10-28 MED ORDER — POTASSIUM CHLORIDE CRYS ER 20 MEQ PO TBCR
40.0000 meq | EXTENDED_RELEASE_TABLET | Freq: Once | ORAL | Status: DC
  Filled 2016-10-28: qty 2

## 2016-10-28 NOTE — Progress Notes (Signed)
Patient ID: Peter Nelson, male   DOB: 04-19-1951, 66 y.o.   MRN: [id number]    Progress Note   Subjective   C/o vomiting x one this am- clear, says stools are brown Also c/o mild subxyphoid pain after eating    Objective   Vital signs in last 24 hours: Temp:  [98 F (36.7 C)-98.4 F (36.9 C)] 98 F (36.7 C) (01/11 0800) Pulse Rate:  [64-84] 80 (01/11 0800) Resp:  [10-19] 18 (01/11 0800) BP: (123-170)/(61-95) 148/79 (01/11 0800) SpO2:  [96 %-99 %] 98 % (01/11 0800) Weight:  [154 lb 8.7 oz (70.1 kg)] 154 lb 8.7 oz (70.1 kg) (01/11 0500) Last BM Date: 10/28/16 General:    AA male  in NAD Heart:  Regular rate and rhythm; no murmurs Lungs: Respirations even and unlabored, lungs CTA bilaterally Abdomen:  Soft, minimally tender epigastrium and mildly distended. Normal bowel sounds. Extremities:  Without edema. Neurologic:  Alert and oriented,  grossly normal neurologically. Psych:  Cooperative. Normal mood and affect.  Intake/Output from previous day: 01/10 0701 - 01/11 0700 In: 1950 [I.V.:1950] Out: -  Intake/Output this shift: Total I/O In: 50 [I.V.:50] Out: -   Lab Results:  Recent Labs  10/26/16 0331 10/26/16 0647  10/27/16 1648 10/27/16 2234 10/28/16 0345  WBC 6.0 5.6  --   --   --  6.1  HGB 7.3* 7.7*  < > 9.2* 8.3* 8.4*  HCT 21.2* 22.0*  < > 27.2* 24.5* 24.8*  PLT 67* 61*  --   --   --  86*  < > = values in this interval not displayed. BMET  Recent Labs  10/26/16 0339 10/27/16 0407 10/28/16 0345  NA 140 140 139  K 3.9 3.6 3.4*  CL 109 113* 112*  CO2 24 22 23   GLUCOSE 102* 96 98  BUN 38* 34* 25*  CREATININE 0.96 1.09 0.99  CALCIUM 7.5* 7.5* 7.4*   LFT  Recent Labs  10/26/16 0339  10/28/16 0345  PROT 5.6*  < > 5.9*  ALBUMIN 2.1*  < > 2.1*  AST 106*  < > 85*  ALT 38  < > 33  ALKPHOS 66  < > 66  BILITOT 2.6*  < > 2.0*  BILIDIR 1.1*  --   --   IBILI 1.5*  --   --   < > = values in this interval not displayed. PT/INR  Recent Labs  10/25/16 1939 10/28/16 0345  LABPROT 19.3* 20.0*  INR 1.61 1.68    Studies/Results: Dg Chest Port 1 View  Result Date: 10/26/2016 CLINICAL DATA:  66 y/o  M; sudden onset upper anterior chest pain. EXAM: PORTABLE CHEST 1 VIEW COMPARISON:  10/25/2016 chest radiograph FINDINGS: Low lung volumes. Hazy opacities of the lung bases may represent atelectasis or possibly aspiration given recent EGD. Bones are unremarkable. No pneumothorax or effusion. IMPRESSION: Low lung volumes and hazy opacities at lung bases which may represent atelectasis or possibly aspiration. Electronically Signed   By: [name] M.D.   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 who has continued to drink- admitted with acute variceal bleed  Stable s/p EGD and banding of large varices  10/26/2016 Some subxyphoid discomfort today - likelysecondary to banding sites  #2 anemia - secondary to blood loss #3 hx HCC - s/p ablation   Plan; Ok to transfer out of unit Full liquid  diet today Will add carafate liquid QID Continue BID PPI Can stop Octreotide , add Corgard 20 mg po today(increase if tolerates) Will need repeat EGD with Dr Rhea Belton in 3-4 weeks will arrange at discharge  Principal Problem:   Acute GI bleeding Active Problems:   Alcohol abuse   Chronic hepatitis C without hepatic coma (HCC)   Hepatic cirrhosis (HCC)   Hepatocellular carcinoma (HCC)   Acute blood loss anemia   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

## 2016-10-28 NOTE — Progress Notes (Signed)
PROGRESS NOTE    Peter Nelson  ZDG:[id number] DOB: 05-May-1951 DOA: 10/25/2016 PCP: Concepcion Living, NP   Chief Complaint  Patient presents with  . Hematemesis  . Blood In Stools    Brief Narrative:  HPI on 10/22/2016 by Dr. Midge Minium Peter Nelson is a 66 y.o. male with history of hepatocellular carcinoma status post microwave ablation in March 2017, hepatitis C, cirrhosis of the liver and alcohol abuse presents because of hematemesis. Patient states over the last 2 hours before presenting to the ER patient had 3 episodes of frank hematemesis. Patient initially also has some abdominal discomfort, which has resolved. Denies any chest pain or shortness of breath. Patient has been noticing 2 days of melanotic stool. Denies using any NSAIDs and aspirin but continues to drink alcohol. Hemoglobin the ER has dropped at least by 5 g from October 2017. Today it is around 7. On-call gastroenterologist Dr. Caren Macadam has been consulted. Patient is placed on Protonix and octreotide and antibiotics. Patient will be admitted for acute GI bleed.  Assessment & Plan   Acute GI bleeding secondary to variceal bleed/ Acute blood loss anemia -Gastroenterology consulted and appreciated -S/p EGD on 10/27/2015, found esophageal varices, actively bleeding and banded x4 -Hemoglobin had dropped to 7.3, currently 8.4 -Placed on PPI GTT and octreotide infusion, GI changed PPI to twice daily, continue octreotide infusion for 48 hours -Continue to monitor CBC -Patient on need repeat EGD with banding protocol in 2-4 weeks -Diet may be advanced slowly, started on full liquid diet. Patient felt some nausea and burning pain this morning after eating grits and ice cream. -Will order GI cocktail and continue antiemetics PRN  Alcohol abuse -Continue CIWA protocol -Discussed cessation with patient -no active signs of withdrawal  Thrombocytopenia -Likely secondary to alcoholic cirrhosis and abuse -Continue to monitor  CBC  Cirrhosis with mild coagulopathy -Patient did receive vitamin K in the emergency department -Was placed on ceftriaxone for GI bleed -Given Vit K today by GI  Hepatocellular carcinoma -Stable, followed by Dr.Feng, oncologist  Hepatitis C -Continue outpatient ID follow-up  DVT Prophylaxis  SCDs  Code Status: Full  Family Communication: None at bedside  Disposition Plan: Currently admitted. Will transfer to tele.  Continue to monitor hemoglobin and abdominal pain. Possible discharge with 24-48 hours  Consultants Gastroenterology  Procedures  EGD  Antibiotics   Anti-infectives    Start     Dose/Rate Route Frequency Ordered Stop   10/26/16 0015  cefTRIAXone (ROCEPHIN) 2 g in dextrose 5 % 50 mL IVPB     2 g 100 mL/hr over 30 Minutes Intravenous Daily at bedtime 10/26/16 0001     10/25/16 2245  cefTRIAXone (ROCEPHIN) 1 g in dextrose 5 % 50 mL IVPB  Status:  Discontinued     1 g 100 mL/hr over 30 Minutes Intravenous  Once 10/25/16 2231 10/26/16 0001      Subjective:   Peter Nelson seen and examined today.  Patient feels nauseated this morning after eating. Feels pain in his abdominal pain. Denies any dizziness, denies any melena or hematochezia. Denies chest pain or shortness of breath.  Objective:   Vitals:   10/28/16 0500 10/28/16 0600 10/28/16 0700 10/28/16 0800  BP: (!) 160/78 (!) 149/95 (!) 149/91 (!) 148/79  Pulse: 67 82 74 80  Resp: 13 18 13 18   Temp:    98 F (36.7 C)  TempSrc:    Oral  SpO2: 99% 98% 98% 98%  Weight: 70.1 kg (154 lb 8.7  oz)     Height:        Intake/Output Summary (Last 24 hours) at 10/28/16 1052 Last data filed at 10/28/16 0800  Gross per 24 hour  Intake             2000 ml  Output                0 ml  Net             2000 ml   Filed Weights   10/26/16 1654 10/27/16 0416 10/28/16 0500  Weight: 62.6 kg (138 lb 0.1 oz) 62 kg (136 lb 11 oz) 70.1 kg (154 lb 8.7 oz)    Exam  General: Well developed, well nourished, NAD,  appears stated age  HEENT: NCAT, mucous membranes moist.   Cardiovascular: S1 S2 auscultated, no rubs, murmurs or gallops. Regular rate and rhythm.  Respiratory: Clear to auscultation bilaterally with equal chest rise  Abdomen: Soft, Epigastric TTP, nondistended, + bowel sounds  Extremities: warm dry without cyanosis clubbing or edema  Neuro: AAOx3, nonfocal  Psych: Normal affect and demeanor with intact judgement and insight   Data Reviewed: I have personally reviewed following labs and imaging studies  CBC:  Recent Labs Lab 10/25/16 1939 10/26/16 0331 10/26/16 0647  10/27/16 0407 10/27/16 1014 10/27/16 1648 10/27/16 2234 10/28/16 0345  WBC 6.2 6.0 5.6  --   --   --   --   --  6.1  HGB 7.9* 7.3* 7.7*  < > 8.2* 8.7* 9.2* 8.3* 8.4*  HCT 23.4* 21.2* 22.0*  < > 23.9* 25.0* 27.2* 24.5* 24.8*  MCV 104.0* 102.4* 100.0  --   --   --   --   --  102.9*  PLT 94* 67* 61*  --   --   --   --   --  86*  < > = values in this interval not displayed. Basic Metabolic Panel:  Recent Labs Lab 10/25/16 1939 10/26/16 0339 10/27/16 0407 10/28/16 0345  NA 139 140 140 139  K 3.5 3.9 3.6 3.4*  CL 107 109 113* 112*  CO2 25 24 22 23   GLUCOSE 105* 102* 96 98  BUN 34* 38* 34* 25*  CREATININE 0.95 0.96 1.09 0.99  CALCIUM 7.9* 7.5* 7.5* 7.4*   GFR: Estimated Creatinine Clearance: 68.6 mL/min (by C-G formula based on SCr of 0.99 mg/dL). Liver Function Tests:  Recent Labs Lab 10/25/16 1939 10/26/16 0339 10/27/16 0407 10/28/16 0345  AST 127* 106* 87* 85*  ALT 45 38 34 33  ALKPHOS 81 66 67 66  BILITOT 3.0* 2.6* 2.5* 2.0*  PROT 6.4* 5.6* 5.8* 5.9*  ALBUMIN 2.4* 2.1* 2.1* 2.1*   No results for input(s): LIPASE, AMYLASE in the last 168 hours. No results for input(s): AMMONIA in the last 168 hours. Coagulation Profile:  Recent Labs Lab 10/25/16 1939 10/28/16 0345  INR 1.61 1.68   Cardiac Enzymes: No results for input(s): CKTOTAL, CKMB, CKMBINDEX, TROPONINI in the last 168  hours. BNP (last 3 results) No results for input(s): PROBNP in the last 8760 hours. HbA1C: No results for input(s): HGBA1C in the last 72 hours. CBG:  Recent Labs Lab 10/27/16 1541 10/27/16 2016 10/28/16 0015 10/28/16 0407 10/28/16 0804  GLUCAP 160* 96 113* 89 128*   Lipid Profile: No results for input(s): CHOL, HDL, LDLCALC, TRIG, CHOLHDL, LDLDIRECT in the last 72 hours. Thyroid Function Tests: No results for input(s): TSH, T4TOTAL, FREET4, T3FREE, THYROIDAB in the last 72 hours. Anemia Panel:  No results for input(s): VITAMINB12, FOLATE, FERRITIN, TIBC, IRON, RETICCTPCT in the last 72 hours. Urine analysis:    Component Value Date/Time   COLORURINE AMBER (A) 10/01/2016 1001   APPEARANCEUR CLEAR 10/01/2016 1001   LABSPEC >=1.030 10/01/2016 1006   PHURINE 5.5 10/01/2016 1006   GLUCOSEU 100 (A) 10/01/2016 1006   HGBUR LARGE (A) 10/01/2016 1006   BILIRUBINUR MODERATE (A) 10/01/2016 1006   KETONESUR NEGATIVE 10/01/2016 1006   PROTEINUR 100 (A) 10/01/2016 1006   UROBILINOGEN 4.0 (H) 10/01/2016 1006   NITRITE NEGATIVE 10/01/2016 1006   LEUKOCYTESUR NEGATIVE 10/01/2016 1006   Sepsis Labs: @LABRCNTIP (procalcitonin:4,lacticidven:4)  ) Recent Results (from the past 240 hour(s))  MRSA PCR Screening     Status: None   Collection Time: 10/26/16  4:53 PM  Result Value Ref Range Status   MRSA by PCR NEGATIVE NEGATIVE Final    Comment:        The GeneXpert MRSA Assay (FDA approved for NASAL specimens only), is one component of a comprehensive MRSA colonization surveillance program. It is not intended to diagnose MRSA infection nor to guide or monitor treatment for MRSA infections.       Radiology Studies: Dg Chest Port 1 View  Result Date: 10/26/2016 CLINICAL DATA:  66 y/o  M; sudden onset upper anterior chest pain. EXAM: PORTABLE CHEST 1 VIEW COMPARISON:  10/25/2016 chest radiograph FINDINGS: Low lung volumes. Hazy opacities of the lung bases may represent atelectasis  or possibly aspiration given recent EGD. Bones are unremarkable. No pneumothorax or effusion. IMPRESSION: Low lung volumes and hazy opacities at lung bases which may represent atelectasis or possibly aspiration. Electronically Signed   By: [name] M.D.   On: 10/26/2016 20:45     Scheduled Meds: . sodium chloride   Intravenous Once  . cefTRIAXone (ROCEPHIN)  IV  2 g Intravenous QHS  . folic acid  1 mg Oral Daily  . LORazepam  0-4 mg Intravenous Q12H  . multivitamin with minerals  1 tablet Oral Daily  . nadolol  20 mg Oral Daily  . [START ON 10/29/2016] pantoprazole  40 mg Intravenous Q12H  . potassium chloride  40 mEq Oral Once  . sucralfate  1 g Oral Q6H  . thiamine  100 mg Oral Daily   Or  . thiamine  100 mg Intravenous Daily   Continuous Infusions: . pantoprozole (PROTONIX) infusion 8 mg/hr (10/28/16 0800)     LOS: 3 days   Time Spent in minutes   30 minutes  Kathlynn Swofford D.O. on 10/28/2016 at 10:52 AM  Between 7am to 7pm - Pager - [phone]  After 7pm go to [email or web address] - password [email or web address]  And look for the night coverage person covering for me after hours  [facility] Office  [phone]

## 2016-10-29 ENCOUNTER — Encounter (HOSPITAL_COMMUNITY): Payer: Self-pay | Admitting: Gastroenterology

## 2016-10-29 ENCOUNTER — Telehealth: Payer: Self-pay | Admitting: *Deleted

## 2016-10-29 LAB — BASIC METABOLIC PANEL
ANION GAP: 3 — AB (ref 5–15)
BUN: 19 mg/dL (ref 6–20)
CHLORIDE: 112 mmol/L — AB (ref 101–111)
CO2: 23 mmol/L (ref 22–32)
Calcium: 7.2 mg/dL — ABNORMAL LOW (ref 8.9–10.3)
Creatinine, Ser: 0.91 mg/dL (ref 0.61–1.24)
GFR calc non Af Amer: >60 mL/min (ref >60)
GLUCOSE: 114 mg/dL — AB (ref 65–99)
Potassium: 3.7 mmol/L (ref 3.5–5.1)
Sodium: 138 mmol/L (ref 135–145)

## 2016-10-29 LAB — GLUCOSE, CAPILLARY
GLUCOSE-CAPILLARY: 110 mg/dL — AB (ref 65–99)
GLUCOSE-CAPILLARY: 130 mg/dL — AB (ref 65–99)
Glucose-Capillary: 107 mg/dL — ABNORMAL HIGH (ref 65–99)
Glucose-Capillary: 107 mg/dL — ABNORMAL HIGH (ref 65–99)
Glucose-Capillary: 100 mg/dL — ABNORMAL HIGH (ref 65–99)
Glucose-Capillary: 111 mg/dL — ABNORMAL HIGH (ref 65–99)

## 2016-10-29 LAB — CBC
HCT: 24.2 % — ABNORMAL LOW (ref 39.0–52.0)
HEMOGLOBIN: 8.1 g/dL — AB (ref 13.0–17.0)
MCH: 34.6 pg — ABNORMAL HIGH (ref 26.0–34.0)
MCHC: 33.5 g/dL (ref 30.0–36.0)
MCV: 103.4 fL — AB (ref 78.0–100.0)
Platelets: 97 10*3/uL — ABNORMAL LOW (ref 150–400)
RBC: 2.34 MIL/uL — ABNORMAL LOW (ref 4.22–5.81)
RDW: 18.6 % — AB (ref 11.5–15.5)
WBC: 6.1 10*3/uL (ref 4.0–10.5)

## 2016-10-29 LAB — TYPE AND SCREEN
ABO/RH(D): O NEG
Antibody Screen: NEGATIVE
UNIT DIVISION: 0
Unit division: 0

## 2016-10-29 LAB — HEMOGLOBIN AND HEMATOCRIT, BLOOD
HEMATOCRIT: 25 % — AB (ref 39.0–52.0)
HEMATOCRIT: 26.1 % — AB (ref 39.0–52.0)
HEMATOCRIT: 24.7 % — AB (ref 39.0–52.0)
HEMOGLOBIN: 8.6 g/dL — AB (ref 13.0–17.0)
HEMOGLOBIN: 8.4 g/dL — AB (ref 13.0–17.0)
Hemoglobin: 8.5 g/dL — ABNORMAL LOW (ref 13.0–17.0)

## 2016-10-29 LAB — ETHANOL: Alcohol, Ethyl (B): <5 mg/dL (ref <5)

## 2016-10-29 NOTE — Progress Notes (Signed)
Upon transferring patient, an empty bottle of Dos Equis was found in the corner of the room. Patient was asked if he has been drinking or if visitors have brought him anything, also if visitors have been drinking. Pt denies and states "that's not mine." receiving nurse was made aware and MD was notified. Full report was given to nurse and patient was transferred via wheelchair. MD to order a blood alcohol level.

## 2016-10-29 NOTE — Progress Notes (Signed)
Patient ID: Peter Nelson, male   DOB: 10-30-1950, 66 y.o.   MRN: [id number]    Progress Note   Subjective  Feels better, less discomfort in esophagus- wants to try soft food Does not feel ready to go home today, has been up to BR with help only Stool brown, no abdominal pain HGB 8.1   Objective   Vital signs in last 24 hours: Temp:  [98.2 F (36.8 C)-98.7 F (37.1 C)] 98.6 F (37 C) (01/12 0800) Pulse Rate:  [63-94] 71 (01/12 0700) Resp:  [10-22] 15 (01/12 0700) BP: (103-138)/(51-78) 120/67 (01/12 0400) SpO2:  [95 %-100 %] 95 % (01/12 0700) Weight:  [154 lb 8.7 oz (70.1 kg)] 154 lb 8.7 oz (70.1 kg) (01/12 0329) Last BM Date: 10/28/16 General:    AA male  in NAD Heart:  Regular rate and rhythm; no murmurs Lungs: Respirations even and unlabored, lungs CTA bilaterally Abdomen:  Soft, nontender distended with ascites. Normal bowel sounds. Extremities:  Without edema. Neurologic:  Alert and oriented,  grossly normal neurologically. Psych:  Cooperative. Normal mood and affect.  Intake/Output from previous day: 01/11 0701 - 01/12 0700 In: 649.8 [I.V.:599.8; IV Piggyback:50] Out: -  Intake/Output this shift: No intake/output data recorded.  Lab Results:  Recent Labs  10/28/16 0345  10/28/16 1646 10/28/16 2230 10/29/16 0344  WBC 6.1  --   --   --  6.1  HGB 8.4*  < > 8.4* 7.9* 8.1*  HCT 24.8*  < > 25.4* 23.4* 24.2*  PLT 86*  --   --   --  97*  < > = values in this interval not displayed. BMET  Recent Labs  10/27/16 0407 10/28/16 0345 10/29/16 0344  NA 140 139 138  K 3.6 3.4* 3.7  CL 113* 112* 112*  CO2 22 23 23   GLUCOSE 96 98 114*  BUN 34* 25* 19  CREATININE 1.09 0.99 0.91  CALCIUM 7.5* 7.4* 7.2*   LFT  Recent Labs  10/28/16 0345  PROT 5.9*  ALBUMIN 2.1*  AST 85*  ALT 33  ALKPHOS 66  BILITOT 2.0*   PT/INR  Recent Labs  10/28/16 0345  LABPROT 20.0*  INR 1.68    Studies/Results: No results found.     Assessment / Plan:    #1 66 yo AA  male with acute variceal bleed - stable s/p banding on 1/9 No bleeding since procedure. Some esophageal pain yesterday - inproving #2 anemia- secondary to acute blood loss #3 hx HCC- s/p ablation #4ETOH abuse - active until admit- no overt withdrawal sxs  PLAN; OOB, ambulate today with help Advance to 2gm sodium diet /soft Started Corgard yesterday - will increase to 40 mg daily- titrate to HR 60 Anticipate discharge tomorrow Our office will arrange follow up EGD with Dr [name] in 3-4 weeks , and will need follow up labs next week    Principal Problem:   Acute GI bleeding Active Problems:   Alcohol abuse   Chronic hepatitis C without hepatic coma (HCC)   Hepatic cirrhosis ([place])   Hepatocellular carcinoma ([place])   Acute blood loss anemia   Hemorrhage of esophageal varices in alcoholic cirrhosis ([place])   Upper GI bleeding     LOS: 4 days   Amy Esterwood  10/29/2016, 9:26 AM

## 2016-10-29 NOTE — Telephone Encounter (Signed)
Patient has been scheduled for previsit on 11/10/16 @ 10:30 am and is scheduled for endoscopy with banding at [facility] on 11/16/16 @ 11:00 am with 9:45 am arrival. Patient is currently hospitalized so I will contact him next week to advise of these appointment date/times and locations.

## 2016-10-29 NOTE — Telephone Encounter (Signed)
-----   Message from Jerene Bears, MD sent at 10/29/2016  2:15 PM EST ----- Regarding: Needs EGD EGD at [facility] for banding protocol In about 2-4 weeks, next available Tues am or Fri am outpatient scope day Thanks [facility]

## 2016-10-29 NOTE — Progress Notes (Signed)
PROGRESS NOTE    Peter Nelson  ZOX:[id number] DOB: 07-02-1951 DOA: 10/25/2016 PCP: Concepcion Living, NP   Chief Complaint  Patient presents with  . Hematemesis  . Blood In Stools    Brief Narrative:  HPI on 10/22/2016 by Dr. Midge Minium Peter Nelson is a 66 y.o. male with history of hepatocellular carcinoma status post microwave ablation in March 2017, hepatitis C, cirrhosis of the liver and alcohol abuse presents because of hematemesis. Patient states over the last 2 hours before presenting to the ER patient had 3 episodes of frank hematemesis. Patient initially also has some abdominal discomfort, which has resolved. Denies any chest pain or shortness of breath. Patient has been noticing 2 days of melanotic stool. Denies using any NSAIDs and aspirin but continues to drink alcohol. Hemoglobin the ER has dropped at least by 5 g from October 2017. Today it is around 7. On-call gastroenterologist Dr. Caren Macadam has been consulted. Patient is placed on Protonix and octreotide and antibiotics. Patient will be admitted for acute GI bleed.  Assessment & Plan   Acute GI bleeding secondary to variceal bleed/ Acute blood loss anemia -Gastroenterology consulted and appreciated -S/p EGD on 10/27/2015, found esophageal varices, actively bleeding and banded x4 -Hemoglobin had dropped to 7.3, currently 8.1 -Placed on PPI GTT and octreotide infusion, transitioned to PPI BID, Corgard -Continue to monitor CBC -Patient on need repeat EGD with banding protocol in 2-4 weeks -Diet may be advanced slowly, started on full liquid diet.  -Patient continues to complain of epigastric burning.  Possibly related to his "tomato-based" soup.  Will  Advanced patient's diet to soft.  Maybe mashed potatoes will help.  -Plan for Follow up with Dr. Rhea Belton in 3-4 weeks -Continue antiemetics PRN  Alcohol abuse -Continue CIWA protocol -Discussed cessation with patient -no active signs of  withdrawal  Thrombocytopenia -Likely secondary to alcoholic cirrhosis and abuse -Platelets 97 today -Continue to monitor CBC  Cirrhosis with mild coagulopathy -Patient did receive vitamin K in the emergency department -Was placed on ceftriaxone for GI bleed -Given Vit K today by GI  Hepatocellular carcinoma -Stable, followed by Dr.Feng, oncologist  Hepatitis C -Continue outpatient ID follow-up  DVT Prophylaxis  SCDs  Code Status: Full  Family Communication: None at bedside  Disposition Plan: Currently admitted. Transferred  to tele.  Continue to monitor hemoglobin and abdominal pain. Possible discharge with 24 hours. Advance diet to soft.  Consultants Gastroenterology  Procedures  EGD  Antibiotics   Anti-infectives    Start     Dose/Rate Route Frequency Ordered Stop   10/26/16 0015  cefTRIAXone (ROCEPHIN) 2 g in dextrose 5 % 50 mL IVPB     2 g 100 mL/hr over 30 Minutes Intravenous Daily at bedtime 10/26/16 0001     10/25/16 2245  cefTRIAXone (ROCEPHIN) 1 g in dextrose 5 % 50 mL IVPB  Status:  Discontinued     1 g 100 mL/hr over 30 Minutes Intravenous  Once 10/25/16 2231 10/26/16 0001      Subjective:   Peter Nelson seen and examined today.  Patient continues to have burning epigastric feeling.  States it occurred after eating this morning.  Continues to have nausea, but no vomiting.  Has been having bowel movements. Denies any dizziness, denies any melena or hematochezia. Denies chest pain or shortness of breath.  Objective:   Vitals:   10/29/16 0500 10/29/16 0600 10/29/16 0700 10/29/16 0800  BP:      Pulse: 63 64 71   Resp:  12 10 15    Temp:    98.6 F (37 C)  TempSrc:    Oral  SpO2: 95% 95% 95%   Weight:      Height:        Intake/Output Summary (Last 24 hours) at 10/29/16 1031 Last data filed at 10/29/16 0400  Gross per 24 hour  Intake           599.84 ml  Output                0 ml  Net           599.84 ml   Filed Weights   10/27/16 0416  10/28/16 0500 10/29/16 0329  Weight: 62 kg (136 lb 11 oz) 70.1 kg (154 lb 8.7 oz) 70.1 kg (154 lb 8.7 oz)    Exam  General: Well developed, well nourished, NAD, appears stated age  HEENT: NCAT, mucous membranes moist.   Cardiovascular: S1 S2 auscultated, RRR, no murmus  Respiratory: Clear to auscultation bilaterally with equal chest rise  Abdomen: Epigastric TTP, distended, + bowel sounds  Extremities: warm dry without cyanosis clubbing or edema  Neuro: AAOx3, nonfocal  Psych: Appropriate mood and affect   Data Reviewed: I have personally reviewed following labs and imaging studies  CBC:  Recent Labs Lab 10/25/16 1939 10/26/16 0331 10/26/16 0647  10/28/16 0345 10/28/16 1012 10/28/16 1646 10/28/16 2230 10/29/16 0344  WBC 6.2 6.0 5.6  --  6.1  --   --   --  6.1  HGB 7.9* 7.3* 7.7*  < > 8.4* 8.2* 8.4* 7.9* 8.1*  HCT 23.4* 21.2* 22.0*  < > 24.8* 25.1* 25.4* 23.4* 24.2*  MCV 104.0* 102.4* 100.0  --  102.9*  --   --   --  103.4*  PLT 94* 67* 61*  --  86*  --   --   --  97*  < > = values in this interval not displayed. Basic Metabolic Panel:  Recent Labs Lab 10/25/16 1939 10/26/16 0339 10/27/16 0407 10/28/16 0345 10/29/16 0344  NA 139 140 140 139 138  K 3.5 3.9 3.6 3.4* 3.7  CL 107 109 113* 112* 112*  CO2 25 24 22 23 23   GLUCOSE 105* 102* 96 98 114*  BUN 34* 38* 34* 25* 19  CREATININE 0.95 0.96 1.09 0.99 0.91  CALCIUM 7.9* 7.5* 7.5* 7.4* 7.2*   GFR: Estimated Creatinine Clearance: 74.6 mL/min (by C-G formula based on SCr of 0.91 mg/dL). Liver Function Tests:  Recent Labs Lab 10/25/16 1939 10/26/16 0339 10/27/16 0407 10/28/16 0345  AST 127* 106* 87* 85*  ALT 45 38 34 33  ALKPHOS 81 66 67 66  BILITOT 3.0* 2.6* 2.5* 2.0*  PROT 6.4* 5.6* 5.8* 5.9*  ALBUMIN 2.4* 2.1* 2.1* 2.1*   No results for input(s): LIPASE, AMYLASE in the last 168 hours. No results for input(s): AMMONIA in the last 168 hours. Coagulation Profile:  Recent Labs Lab  10/25/16 1939 10/28/16 0345  INR 1.61 1.68   Cardiac Enzymes: No results for input(s): CKTOTAL, CKMB, CKMBINDEX, TROPONINI in the last 168 hours. BNP (last 3 results) No results for input(s): PROBNP in the last 8760 hours. HbA1C: No results for input(s): HGBA1C in the last 72 hours. CBG:  Recent Labs Lab 10/28/16 1607 10/28/16 2001 10/29/16 0013 10/29/16 0401 10/29/16 0739  GLUCAP 122* 120* 130* 107* 107*   Lipid Profile: No results for input(s): CHOL, HDL, LDLCALC, TRIG, CHOLHDL, LDLDIRECT in the last 72 hours. Thyroid Function Tests: No  results for input(s): TSH, T4TOTAL, FREET4, T3FREE, THYROIDAB in the last 72 hours. Anemia Panel: No results for input(s): VITAMINB12, FOLATE, FERRITIN, TIBC, IRON, RETICCTPCT in the last 72 hours. Urine analysis:    Component Value Date/Time   COLORURINE AMBER (A) 10/01/2016 1001   APPEARANCEUR CLEAR 10/01/2016 1001   LABSPEC >=1.030 10/01/2016 1006   PHURINE 5.5 10/01/2016 1006   GLUCOSEU 100 (A) 10/01/2016 1006   HGBUR LARGE (A) 10/01/2016 1006   BILIRUBINUR MODERATE (A) 10/01/2016 1006   KETONESUR NEGATIVE 10/01/2016 1006   PROTEINUR 100 (A) 10/01/2016 1006   UROBILINOGEN 4.0 (H) 10/01/2016 1006   NITRITE NEGATIVE 10/01/2016 1006   LEUKOCYTESUR NEGATIVE 10/01/2016 1006   Sepsis Labs: @LABRCNTIP (procalcitonin:4,lacticidven:4)  ) Recent Results (from the past 240 hour(s))  MRSA PCR Screening     Status: None   Collection Time: 10/26/16  4:53 PM  Result Value Ref Range Status   MRSA by PCR NEGATIVE NEGATIVE Final    Comment:        The GeneXpert MRSA Assay (FDA approved for NASAL specimens only), is one component of a comprehensive MRSA colonization surveillance program. It is not intended to diagnose MRSA infection nor to guide or monitor treatment for MRSA infections.       Radiology Studies: No results found.   Scheduled Meds: . sodium chloride   Intravenous Once  . cefTRIAXone (ROCEPHIN)  IV  2 g  Intravenous QHS  . folic acid  1 mg Oral Daily  . LORazepam  0-4 mg Intravenous Q12H  . multivitamin with minerals  1 tablet Oral Daily  . nadolol  20 mg Oral Daily  . pantoprazole  40 mg Intravenous Q12H  . sucralfate  1 g Oral Q6H  . thiamine  100 mg Oral Daily   Or  . thiamine  100 mg Intravenous Daily   Continuous Infusions:    LOS: 4 days   Time Spent in minutes   30 minutes  Derian Dimalanta D.O. on 10/29/2016 at 10:31 AM  Between 7am to 7pm - Pager - [phone]  After 7pm go to [email or web address] - password [email or web address]  And look for the night coverage person covering for me after hours  [facility] Office  [phone]

## 2016-10-29 NOTE — Progress Notes (Signed)
Called by wife of patient. Patient's wife apologized and stated that she "had a wine cooler last night to help me relax". MD and patients floor nurse to be notified. Patient's wife made aware that she cannot bring alcohol into the hospital.

## 2016-10-30 ENCOUNTER — Inpatient Hospital Stay (HOSPITAL_COMMUNITY): Payer: Medicare HMO

## 2016-10-30 DIAGNOSIS — R609 Edema, unspecified: Secondary | ICD-10-CM

## 2016-10-30 LAB — BASIC METABOLIC PANEL
Anion gap: 3 — ABNORMAL LOW (ref 5–15)
BUN: 18 mg/dL (ref 6–20)
CO2: 24 mmol/L (ref 22–32)
Calcium: 7.2 mg/dL — ABNORMAL LOW (ref 8.9–10.3)
Chloride: 109 mmol/L (ref 101–111)
Creatinine, Ser: 0.97 mg/dL (ref 0.61–1.24)
GFR calc Af Amer: >60 mL/min (ref >60)
GFR calc non Af Amer: >60 mL/min (ref >60)
GLUCOSE: 102 mg/dL — AB (ref 65–99)
POTASSIUM: 3.6 mmol/L (ref 3.5–5.1)
Sodium: 136 mmol/L (ref 135–145)

## 2016-10-30 LAB — CBC
HEMATOCRIT: 25.3 % — AB (ref 39.0–52.0)
HEMATOCRIT: 26.9 % — AB (ref 39.0–52.0)
Hemoglobin: 8.7 g/dL — ABNORMAL LOW (ref 13.0–17.0)
Hemoglobin: 9.1 g/dL — ABNORMAL LOW (ref 13.0–17.0)
MCH: 35.4 pg — ABNORMAL HIGH (ref 26.0–34.0)
MCH: 35.7 pg — ABNORMAL HIGH (ref 26.0–34.0)
MCHC: 34.4 g/dL (ref 30.0–36.0)
MCHC: 33.8 g/dL (ref 30.0–36.0)
MCV: 102.8 fL — ABNORMAL HIGH (ref 78.0–100.0)
MCV: 105.5 fL — ABNORMAL HIGH (ref 78.0–100.0)
Platelets: 105 10*3/uL — ABNORMAL LOW (ref 150–400)
Platelets: 112 10*3/uL — ABNORMAL LOW (ref 150–400)
RBC: 2.46 MIL/uL — ABNORMAL LOW (ref 4.22–5.81)
RBC: 2.55 MIL/uL — ABNORMAL LOW (ref 4.22–5.81)
RDW: 17.6 % — AB (ref 11.5–15.5)
RDW: 17.5 % — AB (ref 11.5–15.5)
WBC: 6.1 10*3/uL (ref 4.0–10.5)
WBC: 6.5 10*3/uL (ref 4.0–10.5)

## 2016-10-30 LAB — GLUCOSE, CAPILLARY
Glucose-Capillary: 106 mg/dL — ABNORMAL HIGH (ref 65–99)
Glucose-Capillary: 78 mg/dL (ref 65–99)
Glucose-Capillary: 112 mg/dL — ABNORMAL HIGH (ref 65–99)

## 2016-10-30 LAB — OCCULT BLOOD X 1 CARD TO LAB, STOOL: Fecal Occult Bld: POSITIVE — AB

## 2016-10-30 MED ORDER — FUROSEMIDE 40 MG PO TABS
40.0000 mg | ORAL_TABLET | Freq: Every day | ORAL | Status: DC
  Administered 2016-10-30: 40 mg via ORAL
  Filled 2016-10-30: qty 1

## 2016-10-30 MED ORDER — SPIRONOLACTONE 25 MG PO TABS
100.0000 mg | ORAL_TABLET | Freq: Every day | ORAL | Status: DC
  Administered 2016-10-30: 100 mg via ORAL
  Filled 2016-10-30: qty 4

## 2016-10-30 MED ORDER — THIAMINE HCL 100 MG PO TABS: 100.0000 mg | ORAL_TABLET | Freq: Every day | ORAL | 0 refills | Status: DC

## 2016-10-30 MED ORDER — FUROSEMIDE 40 MG PO TABS: 40.0000 mg | ORAL_TABLET | Freq: Every day | ORAL | 0 refills | Status: DC

## 2016-10-30 MED ORDER — SPIRONOLACTONE 50 MG PO TABS: 50.0000 mg | ORAL_TABLET | Freq: Every day | ORAL | 0 refills | Status: DC

## 2016-10-30 MED ORDER — ONDANSETRON HCL 4 MG PO TABS: 4.0000 mg | ORAL_TABLET | Freq: Four times a day (QID) | ORAL | 0 refills | Status: DC | PRN

## 2016-10-30 MED ORDER — NADOLOL 20 MG PO TABS: 20.0000 mg | ORAL_TABLET | Freq: Every day | ORAL | 0 refills | Status: DC

## 2016-10-30 MED ORDER — FOLIC ACID 1 MG PO TABS: 1.0000 mg | ORAL_TABLET | Freq: Every day | ORAL | 0 refills | Status: DC

## 2016-10-30 MED ORDER — PANTOPRAZOLE SODIUM 40 MG PO TBEC: 40.0000 mg | DELAYED_RELEASE_TABLET | Freq: Two times a day (BID) | ORAL | 0 refills | Status: DC

## 2016-10-30 MED ORDER — SUCRALFATE 1 GM/10ML PO SUSP: 1.0000 g | Freq: Four times a day (QID) | ORAL | 0 refills | Status: DC

## 2016-10-30 MED ORDER — ADULT MULTIVITAMIN W/MINERALS CH: 1.0000 | ORAL_TABLET | Freq: Every day | ORAL | 0 refills | Status: DC

## 2016-10-30 NOTE — Progress Notes (Signed)
Pt noted to have +2 edema to BLE foot to hip. Denies pain, reports only tightness. Dr Ree Kida aware.

## 2016-10-30 NOTE — Progress Notes (Signed)
**  Preliminary report by tech**  Bilateral lower extremity venous duplex completed. There is no evidence of deep or superficial vein thrombosis involving the right and left lower extremities. All visualized vessels appear patent and compressible. There is no evidence of Baker's cysts bilaterally. Results were given to the patient's nurse, Gretchen.  10/30/16 1:30 PM Peter Nelson RVT

## 2016-10-30 NOTE — Discharge Instructions (Signed)
Upper Gastrointestinal Bleeding Upper gastrointestinal (GI) bleeding is bleeding from the swallowing tube (esophagus), stomach, or the first part of the small intestine (duodenum). If you have upper GI bleeding, you may vomit blood or have bloody or black stools. Bleeding can range from mild to serious or even life-threatening. If there is a lot of bleeding, you may need to stay in the hospital. What are the causes? This condition may be caused by:  Ulcer disease of the stomach (peptic ulcer) or duodenum. This is the most common cause of GI bleeding.  Inflammation, irritation, or swelling of the esophagus (esophagitis).  A tear in the esophagus.  Cancer of the esophagus, stomach, or duodenum.  An abnormal or weakened blood vessel in one of the upper GI structures.  A bleeding disorder that impairs the formation of blood clots and causes easy bleeding (coagulopathy). What increases the risk? The following factors may make you more likely to develop this condition:  Being older than 66 years of age.  Being male.  Having another long-term disease, especially liver or kidney disease.  Having a stomach infection caused by Helicobacter pylori bacteria.  Having frequent or severe vomiting.  Abusing alcohol.  Taking certain medicines for a long time, such as:  NSAIDs.  Anticoagulants. What are the signs or symptoms? Symptoms of this condition include:  Vomiting blood.  Black or maroon-colored stools.  Bloody stools.  Weakness or dizziness.  Heartburn.  Abdominal pain.  Difficulty swallowing.  Weight loss.  Yellow eyes or skin (jaundice).  Racing heartbeat. How is this diagnosed? This condition may be diagnosed based on:  Your symptoms and medical history.  A physical exam. During the exam, your health care provider will check for signs of blood loss, such as low blood pressure and a rapid pulse.  Tests, such as:  Blood tests to measure your blood cell count  and to check for other signs of blood loss and clotting ability.  Blood tests to check your liver and kidney function.  A chest X-ray to look for a tear in the esophagus.  Endoscopy. In this procedure, a flexible scope is put down your esophagus and into your stomach or duodenum to look for the source of bleeding.  Angiogram. This may be done if the source of bleeding is not found during endoscopy. For an angiogram, X-rays are taken after a dye is injected into your bloodstream.  Nasogastric tube insertion. This is a tube passed through your nose and down into your stomach. It may be connected to a source of gentle suction to see if any blood comes out. How is this treated? Treatment for this condition depends on the cause of the bleeding. Active bleeding is treated at the hospital. Treatment may include:  Getting fluids through an IV tube inserted into one of your veins.  Getting blood through an IV tube (blood transfusion).  Getting high doses of medicine through the IV to lower stomach acid. This may be done to treat ulcer disease.  Having endoscopy to treat an area of bleeding with high heat (coagulation), injections, or surgical clips.  Having a procedure that involves first doing an angiogram and then blocking blood flow to the bleeding site (embolization).  Stopping or changing some of your regular medicines for a certain amount of time.  Having other surgical procedures if initial treatments do not control bleeding. Follow these instructions at home:  Take over-the-counter and prescription medicines only as told by your health care provider. You may need to  avoid NSAIDs or other medicines that increase bleeding.  Do not drink alcohol.  Drink enough fluid to keep your urine clear or pale yellow.  Follow instructions from your health care provider about eating or drinking restrictions.  Return to your normal activities as told by your health care provider. Ask your health  care provider what activities are safe for you.  Do not use any tobacco products, such as cigarettes, chewing tobacco, and e-cigarettes. If you need help quitting, ask your health care provider.  Keep all follow-up visits as told by your health care provider. This is important. Contact a health care provider if:  You have abdominal pain or heartburn.  You have unexplained weight loss.  You have trouble swallowing.  You have frequent vomiting.  You develop jaundice.  You feel weak or dizzy.  You need help to stop smoking or drinking alcohol. Get help right away if:  You have vomiting with blood.  You have blood in your stools.  You have severe cramps in your back or abdomen.  Your symptoms of upper GI bleeding come back after treatment. This information is not intended to replace advice given to you by your health care provider. Make sure you discuss any questions you have with your health care provider. Document Released: 02/19/2016 Document Revised: 06/06/2016 Document Reviewed: 02/19/2016 Elsevier Interactive Patient Education  2017 [employer].

## 2016-10-30 NOTE — Progress Notes (Signed)
D/C instructions reviewed w/ pt and pt sister. Both verbalize understanding and all questions answered. Pt d/c in stable condition to sister's car by NT in w/c. Pt in possession of d/c instructions and all personal belongings. Pt aware of need to p/u scripts on way home.

## 2016-10-30 NOTE — Discharge Summary (Signed)
Physician Discharge Summary  Peter Nelson OZD:[id number] DOB: 1951/04/16 DOA: 10/25/2016  PCP: Concepcion Living, NP  Admit date: 10/25/2016 Discharge date: 10/30/2016  Time spent: 45 minutes  Recommendations for Outpatient Follow-up:  Patient will be discharged to home.  Patient will need to follow up with primary care provider within one week of discharge, repeat CBC and BMP. Follow up with Dr. Rhea Belton on 11/10/16 at 10:30am and endoscopy on 11/16/16 at 9:45am. Patient should continue medications as prescribed.  Patient should follow a soft diet.  Abstain from alcohol use.   Discharge Diagnoses:  Acute GI bleeding secondary to variceal bleed/ Acute blood loss anemia Alcohol abuse Thrombocytopenia Cirrhosis with mild coagulopathy Hepatocellular carcinoma Hepatitis C Rectal bleeding Lower extremity edema  Discharge Condition: stable  Diet recommendation: soft  Filed Weights   10/28/16 0500 10/29/16 0329 10/30/16 0500  Weight: 70.1 kg (154 lb 8.7 oz) 70.1 kg (154 lb 8.7 oz) 70.1 kg (154 lb 8.7 oz)    History of present illness:  on 10/22/2016 by Dr. Ileana Roup Pennixis a 66 y.o.malewith history of hepatocellular carcinoma status post microwave ablation in March 2017, hepatitis C, cirrhosis of the liver and alcohol abuse presents because of hematemesis. Patient states over the last 2 hours before presenting to the ER patient had 3 episodes of frank hematemesis. Patient initially also has some abdominal discomfort, which has resolved. Denies any chest pain or shortness of breath. Patient has been noticing 2 days of melanotic stool. Denies using any NSAIDs and aspirin but continues to drink alcohol. Hemoglobin the ER has dropped at least by 5 g from October 2017. Today it is around 7. On-call gastroenterologist Dr. Woodfin Ganja been consulted. Patient is placed on Protonix and octreotide and antibiotics. Patient will be admitted for acute GI bleed.  Hospital Course:  Acute  GI bleeding secondary to variceal bleed/ Acute blood loss anemia -Gastroenterology consulted and appreciated -S/p EGD on 10/27/2015, found esophageal varices, actively bleeding and banded x4 -Hemoglobin had dropped to 7.3, currently 9.1 -Placed on PPI GTT and octreotide infusion, transitioned to PPI BID, Corgard -Patient on need repeat EGD with banding protocol in 2-4 weeks -Transitioned to soft diet, tolerated well. -Plan for Follow up with Dr. Rhea Belton in 3-4 weeks- appointments above -Continue antiemetics PRN  Rectal bleeding -Likely hemorrhoidal  -Patient states he had BRBPR last night, noted blood in the toilet -Doubt this is brisk bleeding as patient's hemoglobin is better today and his vitals are stable. Patient denied any nausea or vomiting and was able to eat breakfast. -patient has history of internal hemorrhoids on colonoscopy -Spoke with GI, Dr. Rhea Belton, agrees that this is likely hemorrhoidal. Recommended repeat CBC. -Repeat hemoglobin 9.1  Lower extremity edema -likely secondary to liver cirrhosis -Currently no shortness of breath -Lower extremity doppler: negative for DVT -restarted lasix and spironolactone -Repeat BMP in one week.   Alcohol abuse -Continue CIWA protocol -Discussed cessation with patient -no active signs of withdrawal  Thrombocytopenia -Likely secondary to alcoholic cirrhosis and abuse -Platelets 105 today -Repeat CBC in one wek  Cirrhosis with mild coagulopathy -Patient did receive vitamin K in the emergency department -Was placed on ceftriaxone for GI bleed -Given Vit K today by GI  Hepatocellular carcinoma -Stable, followed by Dr.Feng, oncologist  Hepatitis C -Continue outpatient ID follow-up  Consultants Gastroenterology  Procedures  EGD  Discharge Exam: Vitals:   10/30/16 0930 10/30/16 1510  BP: 116/61 116/66  Pulse: 71 68  Resp:  16  Temp:  99 F (37.2  C)   Complained of rectal bleeding, noted in toilet bowl last  night. Denies any dizziness, denies any melena or hematochezia. Denies chest pain or shortness of breath  Exam  General: Well developed, well nourished, NAD, appears stated age  HEENT: NCAT, mucous membranes moist.   Cardiovascular: S1 S2 auscultated, RRR, no murmus  Respiratory: Clear to auscultation bilaterally with equal chest rise  Abdomen: Nontender, distended, + bowel sounds  Extremities: warm dry without cyanosis clubbing, +lower extremity edema  Neuro: AAOx3, nonfocal  Psych: Appropriate mood and affect  Discharge Instructions Discharge Instructions    Discharge instructions    Complete by:  As directed    Patient will be discharged to home.  Patient will need to follow up with primary care provider within one week of discharge, repeat CBC and BMP. Follow up with Dr. Rhea Belton on 11/10/16 at 10:30am and endoscopy on 11/16/16 at 9:45am. Patient should continue medications as prescribed.  Patient should follow a soft diet.  Abstain from alcohol use.     Current Discharge Medication List    START taking these medications   Details  folic acid (FOLVITE) 1 MG tablet Take 1 tablet (1 mg total) by mouth daily. Qty: 30 tablet, Refills: 0    Multiple Vitamin (MULTIVITAMIN WITH MINERALS) TABS tablet Take 1 tablet by mouth daily. Qty: 30 tablet, Refills: 0    nadolol (CORGARD) 20 MG tablet Take 1 tablet (20 mg total) by mouth daily. Qty: 30 tablet, Refills: 0    ondansetron (ZOFRAN) 4 MG tablet Take 1 tablet (4 mg total) by mouth every 6 (six) hours as needed for nausea. Qty: 20 tablet, Refills: 0    pantoprazole (PROTONIX) 40 MG tablet Take 1 tablet (40 mg total) by mouth 2 (two) times daily. Qty: 60 tablet, Refills: 0    sucralfate (CARAFATE) 1 GM/10ML suspension Take 10 mLs (1 g total) by mouth every 6 (six) hours. Qty: 420 mL, Refills: 0    thiamine 100 MG tablet Take 1 tablet (100 mg total) by mouth daily. Qty: 30 tablet, Refills: 0      CONTINUE these medications  which have CHANGED   Details  furosemide (LASIX) 40 MG tablet Take 1 tablet (40 mg total) by mouth daily. Qty: 30 tablet, Refills: 0    spironolactone (ALDACTONE) 50 MG tablet Take 1 tablet (50 mg total) by mouth daily. Qty: 30 tablet, Refills: 0      CONTINUE these medications which have NOT CHANGED   Details  vitamin B-12 (CYANOCOBALAMIN) 1000 MCG tablet Take 1,000 mcg by mouth daily.    vitamin C (ASCORBIC ACID) 500 MG tablet Take 500 mg by mouth daily.      STOP taking these medications     sildenafil (VIAGRA) 25 MG tablet        Allergies  Allergen Reactions  . Aspirin Other (See Comments)    Does not take because of hep c   Follow-up Information    BERNHARDT, LINDA, NP. Schedule an appointment as soon as possible for a visit in 1 week(s).   Specialty:  Family Medicine Why:  Hopsital follow up Contact information: [address] [place] [address] [phone]        Beverley Fiedler, MD. Go on 11/10/2016.   Specialty:  Gastroenterology Why:  At 10:30am Contact information: [address] [place] [address] [phone]            The results of significant diagnostics from this hospitalization (including imaging, microbiology,  ancillary and laboratory) are listed below for reference.    Significant Diagnostic Studies: Dg Chest Port 1 View  Result Date: 10/26/2016 CLINICAL DATA:  66 y/o  M; sudden onset upper anterior chest pain. EXAM: PORTABLE CHEST 1 VIEW COMPARISON:  10/25/2016 chest radiograph FINDINGS: Low lung volumes. Hazy opacities of the lung bases may represent atelectasis or possibly aspiration given recent EGD. Bones are unremarkable. No pneumothorax or effusion. IMPRESSION: Low lung volumes and hazy opacities at lung bases which may represent atelectasis or possibly aspiration. Electronically Signed   By: [name] M.D.   On: 10/26/2016 20:45   Dg Chest Portable 1 View  Result Date: 10/25/2016 CLINICAL DATA:  Acute  onset of hematemesis.  Initial encounter. EXAM: PORTABLE CHEST 1 VIEW COMPARISON:  Chest radiograph from 12/26/2015 FINDINGS: The lungs are well-aerated and clear. There is no evidence of focal opacification, pleural effusion or pneumothorax. The cardiomediastinal silhouette is within normal limits. No acute osseous abnormalities are seen. IMPRESSION: No acute cardiopulmonary process seen. Electronically Signed   By: Roanna Raider M.D.   On: 10/25/2016 22:10    Microbiology: Recent Results (from the past 240 hour(s))  MRSA PCR Screening     Status: None   Collection Time: 10/26/16  4:53 PM  Result Value Ref Range Status   MRSA by PCR NEGATIVE NEGATIVE Final    Comment:        The GeneXpert MRSA Assay (FDA approved for NASAL specimens only), is one component of a comprehensive MRSA colonization surveillance program. It is not intended to diagnose MRSA infection nor to guide or monitor treatment for MRSA infections.      Labs: Basic Metabolic Panel:  Recent Labs Lab 10/26/16 0339 10/27/16 0407 10/28/16 0345 10/29/16 0344 10/30/16 0509  NA 140 140 139 138 136  K 3.9 3.6 3.4* 3.7 3.6  CL 109 113* 112* 112* 109  CO2 24 22 23 23 24   GLUCOSE 102* 96 98 114* 102*  BUN 38* 34* 25* 19 18  CREATININE 0.96 1.09 0.99 0.91 0.97  CALCIUM 7.5* 7.5* 7.4* 7.2* 7.2*   Liver Function Tests:  Recent Labs Lab 10/25/16 1939 10/26/16 0339 10/27/16 0407 10/28/16 0345  AST 127* 106* 87* 85*  ALT 45 38 34 33  ALKPHOS 81 66 67 66  BILITOT 3.0* 2.6* 2.5* 2.0*  PROT 6.4* 5.6* 5.8* 5.9*  ALBUMIN 2.4* 2.1* 2.1* 2.1*   No results for input(s): LIPASE, AMYLASE in the last 168 hours. No results for input(s): AMMONIA in the last 168 hours. CBC:  Recent Labs Lab 10/26/16 0647  10/28/16 0345  10/29/16 0344 10/29/16 1105 10/29/16 1614 10/29/16 2248 10/30/16 0509 10/30/16 1236  WBC 5.6  --  6.1  --  6.1  --   --   --  6.1 6.5  HGB 7.7*  < > 8.4*  < > 8.1* 8.5* 8.6* 8.4* 8.7* 9.1*    HCT 22.0*  < > 24.8*  < > 24.2* 25.0* 26.1* 24.7* 25.3* 26.9*  MCV 100.0  --  102.9*  --  103.4*  --   --   --  102.8* 105.5*  PLT 61*  --  86*  --  97*  --   --   --  105* 112*  < > = values in this interval not displayed. Cardiac Enzymes: No results for input(s): CKTOTAL, CKMB, CKMBINDEX, TROPONINI in the last 168 hours. BNP: BNP (last 3 results) No results for input(s): BNP in the last 8760 hours.  ProBNP (last 3  results) No results for input(s): PROBNP in the last 8760 hours.  CBG:  Recent Labs Lab 10/29/16 1630 10/29/16 2040 10/30/16 0536 10/30/16 0727 10/30/16 1128  GLUCAP 110* 111* 106* 78 112*       Signed:  Dayvion Sans  [facility] 10/30/2016, 3:31 PM

## 2016-11-01 ENCOUNTER — Other Ambulatory Visit: Payer: Self-pay | Admitting: Internal Medicine

## 2016-11-01 DIAGNOSIS — K922 Gastrointestinal hemorrhage, unspecified: Secondary | ICD-10-CM

## 2016-11-01 LAB — GLUCOSE, CAPILLARY: Glucose-Capillary: 153 mg/dL — ABNORMAL HIGH (ref 65–99)

## 2016-11-01 NOTE — Telephone Encounter (Signed)
I have spoken to patient's girlfriend/caregiver (ok per ROI) to advise that patient has been scheduled for endoscopy at [facility] on 11/16/16 @ 11:00 am with 9:30 am arrival and is also scheduled for a previsit on 11/10/16. She states that she would like to have a later appointment time for previsit so she can be present with patient. I have changed time to 4:30 pm. She also requests that I send a letter with the dates/times/locations of appointments as well. I have sent this via [employer].

## 2016-11-05 ENCOUNTER — Emergency Department (HOSPITAL_COMMUNITY): Payer: Medicare HMO

## 2016-11-05 ENCOUNTER — Encounter (HOSPITAL_COMMUNITY): Payer: Self-pay

## 2016-11-05 ENCOUNTER — Other Ambulatory Visit: Payer: Medicare Other

## 2016-11-05 DIAGNOSIS — J90 Pleural effusion, not elsewhere classified: Secondary | ICD-10-CM | POA: Diagnosis not present

## 2016-11-05 DIAGNOSIS — R748 Abnormal levels of other serum enzymes: Secondary | ICD-10-CM | POA: Insufficient documentation

## 2016-11-05 DIAGNOSIS — D539 Nutritional anemia, unspecified: Secondary | ICD-10-CM | POA: Diagnosis not present

## 2016-11-05 DIAGNOSIS — K7031 Alcoholic cirrhosis of liver with ascites: Secondary | ICD-10-CM | POA: Insufficient documentation

## 2016-11-05 DIAGNOSIS — R600 Localized edema: Secondary | ICD-10-CM | POA: Insufficient documentation

## 2016-11-05 DIAGNOSIS — Z79899 Other long term (current) drug therapy: Secondary | ICD-10-CM | POA: Diagnosis not present

## 2016-11-05 DIAGNOSIS — F17210 Nicotine dependence, cigarettes, uncomplicated: Secondary | ICD-10-CM | POA: Insufficient documentation

## 2016-11-05 DIAGNOSIS — Z8505 Personal history of malignant neoplasm of liver: Secondary | ICD-10-CM | POA: Diagnosis not present

## 2016-11-05 DIAGNOSIS — E8809 Other disorders of plasma-protein metabolism, not elsewhere classified: Secondary | ICD-10-CM | POA: Insufficient documentation

## 2016-11-05 DIAGNOSIS — M7989 Other specified soft tissue disorders: Secondary | ICD-10-CM | POA: Diagnosis present

## 2016-11-05 LAB — BASIC METABOLIC PANEL
Anion gap: 5 (ref 5–15)
BUN: 17 mg/dL (ref 6–20)
CHLORIDE: 107 mmol/L (ref 101–111)
CO2: 25 mmol/L (ref 22–32)
Calcium: 7.8 mg/dL — ABNORMAL LOW (ref 8.9–10.3)
Creatinine, Ser: 1.26 mg/dL — ABNORMAL HIGH (ref 0.61–1.24)
GFR, EST NON AFRICAN AMERICAN: 58 mL/min — AB (ref >60)
Glucose, Bld: 117 mg/dL — ABNORMAL HIGH (ref 65–99)
POTASSIUM: 3.2 mmol/L — AB (ref 3.5–5.1)
SODIUM: 137 mmol/L (ref 135–145)

## 2016-11-05 LAB — CBC
HEMATOCRIT: 27.3 % — AB (ref 39.0–52.0)
Hemoglobin: 9 g/dL — ABNORMAL LOW (ref 13.0–17.0)
MCH: 34 pg (ref 26.0–34.0)
MCHC: 33 g/dL (ref 30.0–36.0)
MCV: 103 fL — AB (ref 78.0–100.0)
Platelets: 148 10*3/uL — ABNORMAL LOW (ref 150–400)
RBC: 2.65 MIL/uL — AB (ref 4.22–5.81)
RDW: 15.7 % — AB (ref 11.5–15.5)
WBC: 6.9 10*3/uL (ref 4.0–10.5)

## 2016-11-05 LAB — I-STAT TROPONIN, ED: Troponin i, poc: 0 ng/mL (ref 0.00–0.08)

## 2016-11-05 NOTE — ED Triage Notes (Signed)
Pt c/o bilateral leg swelling and swelling in his scrotum. Also complaining of epigastric pain. Pt was released from the hospital on last Saturday. A&Ox4.

## 2016-11-06 ENCOUNTER — Emergency Department (HOSPITAL_COMMUNITY)
Admission: EM | Admit: 2016-11-06 | Discharge: 2016-11-06 | Disposition: A | Discharge status: Home / Self Care | Payer: Medicare HMO | Attending: Emergency Medicine | Admitting: Emergency Medicine

## 2016-11-06 DIAGNOSIS — D539 Nutritional anemia, unspecified: Secondary | ICD-10-CM

## 2016-11-06 DIAGNOSIS — R600 Localized edema: Secondary | ICD-10-CM

## 2016-11-06 DIAGNOSIS — R609 Edema, unspecified: Secondary | ICD-10-CM

## 2016-11-06 DIAGNOSIS — R748 Abnormal levels of other serum enzymes: Secondary | ICD-10-CM

## 2016-11-06 DIAGNOSIS — K7031 Alcoholic cirrhosis of liver with ascites: Secondary | ICD-10-CM

## 2016-11-06 DIAGNOSIS — E8809 Other disorders of plasma-protein metabolism, not elsewhere classified: Secondary | ICD-10-CM

## 2016-11-06 LAB — HEPATIC FUNCTION PANEL
ALK PHOS: 117 U/L (ref 38–126)
ALT: 36 U/L (ref 17–63)
AST: 78 U/L — ABNORMAL HIGH (ref 15–41)
Albumin: 2.3 g/dL — ABNORMAL LOW (ref 3.5–5.0)
BILIRUBIN INDIRECT: 1.2 mg/dL — AB (ref 0.3–0.9)
Bilirubin, Direct: 1.1 mg/dL — ABNORMAL HIGH (ref 0.1–0.5)
TOTAL PROTEIN: 6.4 g/dL — AB (ref 6.5–8.1)
Total Bilirubin: 2.3 mg/dL — ABNORMAL HIGH (ref 0.3–1.2)

## 2016-11-06 NOTE — ED Provider Notes (Signed)
[facility] DEPT Provider Note   CSN: [id number] Arrival date & time: 11/05/16  2021    By signing my name below, I, Macon Large, attest that this documentation has been prepared under the direction and in the presence of Delora Fuel, MD. Electronically Signed: Macon Large, ED Scribe. 11/06/16. 2:12 AM.  History   Chief Complaint Chief Complaint  Patient presents with  . Leg Swelling   The history is provided by the patient and a relative. No language interpreter was used.   HPI Comments: Peter Nelson is a 66 y.o. male with PMHx of arthritis, cancer, hepatocellular carcinoma, hyperplastic colon polyp, who presents to the Emergency Department complaining of moderate, constant, bilateral leg swelling onset a week ago. Pt reports associated pain in his lower extremities and swelling in his groin area. Pt's relative notes pt is able to ambulate with minimal difficulty. He also notes having some mild upper abdominal pain. No alleviating factors noted. Denies fever, nausea and vomiting. Pt's relative notes pt has an upcoming appointment on 01/23 at the [facility]. Pt denies consuming any alcoholic beverages.   Past Medical History:  Diagnosis Date  . Alcohol abuse   . Allergy   . Arthritis   . Asthma   . Cancer ([place])    liver  . Hepatitis C   . Hepatocellular carcinoma ([place])   . Hyperplastic colon polyp   . Internal hemorrhoids   . Shortness of breath dyspnea    with activity and anxiety  . Ulcerative colitis ([place])   . Wears glasses     Patient Active Problem List   Diagnosis Date Noted  . Hemorrhage of esophageal varices in alcoholic cirrhosis ([place])   . Upper GI bleeding   . Acute GI bleeding 10/25/2016  . Acute blood loss anemia 10/25/2016  . Enlarged prostate on rectal examination 10/01/2016  . Erectile dysfunction 10/01/2016  . Abscess of right lower leg 03/15/2016  . Eczema 01/26/2016  . Hepatocellular carcinoma ([place]) 11/13/2015  . Memory loss  11/10/2015  . Alcoholic cirrhosis of liver without ascites ([place]) 11/10/2015  . Hepatitis, viral 11/10/2015  . BRBPR (bright red blood per rectum) 10/30/2015  . Hepatic cirrhosis ([place]) 10/30/2015  . Chronic hepatitis C without hepatic coma ([place]) 09/17/2015  . Stress at home 08/26/2015  . Alcohol abuse 07/28/2015  . Tobacco use disorder 07/28/2015  . Syncope 05/08/2015    Past Surgical History:  Procedure Laterality Date  . ESOPHAGOGASTRODUODENOSCOPY (EGD) WITH PROPOFOL [name] 10/26/2016   Procedure: ESOPHAGOGASTRODUODENOSCOPY (EGD) WITH PROPOFOL;  Surgeon: Manus Gunning, MD;  Location: [facility] ENDOSCOPY;  Service: Gastroenterology;  Laterality: [name];  . SKIN GRAFT  1991   Left Hand       Home Medications    Prior to Admission medications   Medication Sig Start Date End Date Taking? Authorizing Provider  folic acid (FOLVITE) 1 MG tablet Take 1 tablet (1 mg total) by mouth daily. 10/31/16  Yes Maryann Mikhail, DO  furosemide (LASIX) 40 MG tablet Take 1 tablet (40 mg total) by mouth daily. 10/30/16  Yes Maryann Mikhail, DO  nadolol (CORGARD) 20 MG tablet Take 1 tablet (20 mg total) by mouth daily. 10/31/16  Yes Maryann Mikhail, DO  ondansetron (ZOFRAN) 4 MG tablet Take 1 tablet (4 mg total) by mouth every 6 (six) hours as needed for nausea. 10/30/16  Yes Maryann Mikhail, DO  pantoprazole (PROTONIX) 40 MG tablet Take 1 tablet (40 mg total) by mouth 2 (two) times daily. 10/30/16  Yes Cristal Ford,  DO  spironolactone (ALDACTONE) 50 MG tablet Take 1 tablet (50 mg total) by mouth daily. 10/30/16  Yes Maryann Mikhail, DO  sucralfate (CARAFATE) 1 GM/10ML suspension Take 10 mLs (1 g total) by mouth every 6 (six) hours. 10/30/16  Yes Maryann Mikhail, DO  vitamin B-12 (CYANOCOBALAMIN) 1000 MCG tablet Take 1,000 mcg by mouth daily.   Yes Historical Provider, MD  Multiple Vitamin (MULTIVITAMIN WITH MINERALS) TABS tablet Take 1 tablet by mouth daily. Patient not taking: Reported on 11/06/2016 10/31/16    Velta Addison Mikhail, DO  thiamine 100 MG tablet Take 1 tablet (100 mg total) by mouth daily. Patient not taking: Reported on 11/06/2016 10/31/16   Cristal Ford, DO    Family History Family History  Problem Relation Age of Onset  . Hypertension Mother   . Diabetes Mother   . Heart disease Mother   . Alzheimer's disease Mother   . Diabetes Sister   . Diabetes Father   . Cancer Brother     Liver  . Colon cancer Neg Hx   . Esophageal cancer Neg Hx   . Stomach cancer Neg Hx   . Rectal cancer Neg Hx     Social History Social History  Substance Use Topics  . Smoking status: Current Every Day Smoker    Packs/day: 0.50    Years: 55.00    Types: Cigarettes  . Smokeless tobacco: Never Used     Comment: cutting back  . Alcohol use 0.0 oz/week     Comment: "a 1/2 pint a day" for more than 30 years, average 1 pint a day, cut back lately      Allergies   Aspirin   Review of Systems Review of Systems  Cardiovascular: Positive for leg swelling.  Gastrointestinal: Positive for abdominal pain. Negative for nausea and vomiting.  Genitourinary: Positive for penile swelling.  Musculoskeletal: Positive for myalgias.  All other systems reviewed and are negative.    Physical Exam Updated Vital Signs BP 129/73 (BP Location: Right Arm)   Pulse 77   Temp 98.3 F (36.8 C) (Oral)   Resp 16   SpO2 100%   Physical Exam  Constitutional: He is oriented to person, place, and time. He appears well-developed and well-nourished.  HENT:  Head: Normocephalic and atraumatic.  Eyes: EOM are normal. Pupils are equal, round, and reactive to light.  Neck: Normal range of motion. Neck supple. No JVD present.  Cardiovascular: Normal rate, regular rhythm and normal heart sounds.   No murmur heard. Pulmonary/Chest: Effort normal and breath sounds normal. He has no wheezes. He has no rales. He exhibits no tenderness.  Abdominal: Soft. Bowel sounds are normal. He exhibits no distension and no mass.  There is no tenderness.  Moderate ascites with positive fluid wave.   Genitourinary:  Genitourinary Comments: Mild penile edema, uncircumcised, mild scrotal edema.   Musculoskeletal: Normal range of motion. He exhibits edema.  3+ pedal and pretibial edema. 2+ presacral edema.  Lymphadenopathy:    He has no cervical adenopathy.  Neurological: He is alert and oriented to person, place, and time. No cranial nerve deficit. He exhibits normal muscle tone. Coordination normal.  Skin: Skin is warm and dry. No rash noted.  Psychiatric: He has a normal mood and affect. His behavior is normal. Judgment and thought content normal.  Nursing note and vitals reviewed.    ED Treatments / Results   DIAGNOSTIC STUDIES: Oxygen Saturation is 100% on RA, normal by my interpretation.    COORDINATION OF CARE: 2:05  AM Discussed treatment plan with pt at bedside which includes labs, chest imaging and EKG and pt agreed to plan.   Labs (all labs ordered are listed, but only abnormal results are displayed) Labs Reviewed  BASIC METABOLIC PANEL - Abnormal; Notable for the following:       Result Value   Potassium 3.2 (*)    Glucose, Bld 117 (*)    Creatinine, Ser 1.26 (*)    Calcium 7.8 (*)    GFR calc non Af Amer 58 (*)    All other components within normal limits  CBC - Abnormal; Notable for the following:    RBC 2.65 (*)    Hemoglobin 9.0 (*)    HCT 27.3 (*)    MCV 103.0 (*)    RDW 15.7 (*)    Platelets 148 (*)    All other components within normal limits  HEPATIC FUNCTION PANEL - Abnormal; Notable for the following:    Total Protein 6.4 (*)    Albumin 2.3 (*)    AST 78 (*)    Total Bilirubin 2.3 (*)    Bilirubin, Direct 1.1 (*)    Indirect Bilirubin 1.2 (*)    All other components within normal limits  Randolm Idol, ED     Radiology Dg Chest 2 View  Result Date: 11/05/2016 CLINICAL DATA:  Swelling in the bilateral legs and scrotum EXAM: CHEST  2 VIEW COMPARISON:  10/26/2016  FINDINGS: Slight elevation of left diaphragm. Tiny pleural effusions on the lateral view. Mild atelectasis left base. No consolidation. Heart size upper normal. Vascularity within normal limits. No pneumothorax. IMPRESSION: Tiny bilateral pleural effusions.  Mild atelectasis left lung base. Electronically Signed   By: Donavan Foil M.D.   On: 11/05/2016 21:46    Procedures Procedures (including critical care time)  Medications Ordered in ED Medications - No data to display   Initial Impression / Assessment and Plan / ED Course  I have reviewed the triage vital signs and the nursing notes.  Pertinent labs & imaging results that were available during my care of the patient were reviewed by me and considered in my medical decision making (see chart for details).  Ascites and peripheral edema consistent with cirrhosis. Old records are reviewed confirming recent hospitalization for upper GI bleed due to esophageal varices. Albumin during that visit was 2.1. He was discharged on spironolactone 50 mg once a day and furosemide 40 mg once a day. Laboratory workup is repeated showing stable hemoglobin, and mild increase in creatinine which is not felt to be clinically significant. He has a follow-up appointment with her condition neurology in the next several days. Is advised to increase his spironolactone to twice a day and double his furosemide. Furosemide increases to be resected to 5 days. Further management of his diuretics will be from his PCP and gastroenterologist. He is advised and a low-salt diet.  Final Clinical Impressions(s) / ED Diagnoses   Final diagnoses:  Peripheral edema  Ascites due to alcoholic cirrhosis (HCC)  Elevated liver enzymes  Hypoalbuminemia  Macrocytic anemia    New Prescriptions New Prescriptions   No medications on file   I personally performed the services described in this documentation, which was scribed in my presence. The recorded information has been  reviewed and is accurate.       Delora Fuel, MD 82/95/62 [id number]

## 2016-11-06 NOTE — ED Triage Notes (Signed)
Attempted  [phone] and [phone] to update med changes as requested by Hulen Luster FNP. No answer or voice mail on either phone. IF they call back  Increase Spironolactone to 2 x a day and Double Furosemide, follow up with PCP.

## 2016-11-06 NOTE — Discharge Instructions (Signed)
Stay on a low-salt diet. Increase your spironolactone to twice a day. Increase yous furosemide to two tablets in the morning for the next five days, then resume one tablet in the morning.

## 2016-11-09 ENCOUNTER — Ambulatory Visit (INDEPENDENT_AMBULATORY_CARE_PROVIDER_SITE_OTHER): Payer: Medicare HMO | Admitting: Neurology

## 2016-11-09 ENCOUNTER — Ambulatory Visit: Payer: Medicaid Other | Admitting: Neurology

## 2016-11-09 ENCOUNTER — Encounter: Payer: Self-pay | Admitting: Neurology

## 2016-11-09 VITALS — BP 142/76 | HR 84 | Ht 66.6 in | Wt 154.2 lb

## 2016-11-09 DIAGNOSIS — K7030 Alcoholic cirrhosis of liver without ascites: Secondary | ICD-10-CM | POA: Diagnosis not present

## 2016-11-09 DIAGNOSIS — R413 Other amnesia: Secondary | ICD-10-CM | POA: Diagnosis not present

## 2016-11-09 NOTE — Progress Notes (Signed)
NEUROLOGY FOLLOW UP OFFICE NOTE  Peter Nelson Self [id number]  HISTORY OF PRESENT ILLNESS: I had the pleasure of seeing Peter Nelson in follow-up in the neurology clinic on 11/09/2016. He is accompanied by his wife who helps supplement the history today.  The patient was last seen a year ago for memory loss. MMSE in January 2017 was 27/30.  Records and images were personally reviewed where available. I personally reviewed CT head with and without contrast which did not show any acute changes, no evidence of mass lesion/enhancing lesion. There was mild diffuse atrophy. TSH and B12 normal. Since his last visit, he feels his memory is good, his wife reports it is on and off. He was diagnosed with hepatocellular carcinoma and underwent microwave ablation last March 2017. He was recently admitted for hematemesis at the beginning of the month due to bleeding varices. He states that he is not drinking alcohol anymore. He reports that he remembers to take his medications. His wife is in charge of bills. He denies getting lost driving. He denies any headaches, dizziness, focal numbness/tingling/weakness. He is concerned about swelling in both legs up to the scrotal region that started after his recent hospital stay. He has an appointment with his doctor tomorrow.   HPI 10/2015: This is a pleasant 66 year old ambidextrous right-hand dominant man with a history of alcohol abuse, hepatitis C, liver cirrhosis, hepatocellular carcinoma s/p microwave ablation, who presented for evaluation of worsening memory. He and his wife started noticing changes around 6 months ago, his wife has noticed that he would misplace things more (they still have not found 2 objects he lost). He would go to a different room and forget what he came to get. He has noticed some word-finding difficulties, he want to express himself but they don't come out the way he wants to. His wife of 13 months has noticed he repeats himself. He has had  personality changes where he would all of a sudden get very moody, no paranoia. He denies getting lost driving, no missed medications. His wife is in charge of bills. He denies any family history of memory loss, no significant head injuries. He drinks 1/2 pint of alcohol daily, reports he has reduced this from 1 pint daily.  He has had headaches for the past few years occurring around twice a month, lasting a minute or so, with no associated nausea, vomiting, photo/phonophobia. He has blurred vision. He reports occasional blood in his stool with diarrhea. He denies any dizziness, diplopia, dysarthria, dysphagia, back pain, focal numbness/tingling/weakness, anosmia. He has arthritis. He has noticed tremors in both hands ("I think they come from alcohol").   PAST MEDICAL HISTORY: Past Medical History:  Diagnosis Date  . Alcohol abuse   . Allergy   . Arthritis   . Asthma   . Cancer ([place])    liver  . Hepatitis C   . Hepatocellular carcinoma ([place])   . Hyperplastic colon polyp   . Internal hemorrhoids   . Shortness of breath dyspnea    with activity and anxiety  . Ulcerative colitis ([place])   . Wears glasses     MEDICATIONS: Current Outpatient Prescriptions on File Prior to Visit  Medication Sig Dispense Refill  . folic acid (FOLVITE) 1 MG tablet Take 1 tablet (1 mg total) by mouth daily. 30 tablet 0  . furosemide (LASIX) 40 MG tablet Take 1 tablet (40 mg total) by mouth daily. 30 tablet 0  . nadolol (CORGARD) 20 MG tablet Take  1 tablet (20 mg total) by mouth daily. 30 tablet 0  . ondansetron (ZOFRAN) 4 MG tablet Take 1 tablet (4 mg total) by mouth every 6 (six) hours as needed for nausea. 20 tablet 0  . pantoprazole (PROTONIX) 40 MG tablet Take 1 tablet (40 mg total) by mouth 2 (two) times daily. 60 tablet 0  . spironolactone (ALDACTONE) 50 MG tablet Take 1 tablet (50 mg total) by mouth daily. 30 tablet 0  . sucralfate (CARAFATE) 1 GM/10ML suspension Take 10 mLs (1 g total) by mouth every  6 (six) hours. 420 mL 0  . vitamin B-12 (CYANOCOBALAMIN) 1000 MCG tablet Take 1,000 mcg by mouth daily.     No current facility-administered medications on file prior to visit.     ALLERGIES: Allergies  Allergen Reactions  . Aspirin Other (See Comments)    Does not take because of hep c    FAMILY HISTORY: Family History  Problem Relation Age of Onset  . Hypertension Mother   . Diabetes Mother   . Heart disease Mother   . Alzheimer's disease Mother   . Diabetes Sister   . Diabetes Father   . Cancer Brother     Liver  . Colon cancer Neg Hx   . Esophageal cancer Neg Hx   . Stomach cancer Neg Hx   . Rectal cancer Neg Hx     SOCIAL HISTORY: Social History   Social History  . Marital status: Married    Spouse name: [name]  . Number of children: 39  . Years of education: [name]   Occupational History  . Not on file.   Social History Main Topics  . Smoking status: Current Every Day Smoker    Packs/day: 0.50    Years: 55.00    Types: Cigarettes  . Smokeless tobacco: Never Used     Comment: cutting back  . Alcohol use 0.0 oz/week     Comment: "a 1/2 pint a day" for more than 30 years, average 1 pint a day, cut back lately   . Drug use: No  . Sexual activity: Not on file   Other Topics Concern  . Not on file   Social History Narrative   Married Dec 22nd, 2016 to [place]   Has #9 children    REVIEW OF SYSTEMS: Constitutional: No fevers, chills, or sweats, no generalized fatigue, change in appetite Eyes: No visual changes, double vision, eye pain Ear, nose and throat: No hearing loss, ear pain, nasal congestion, sore throat Cardiovascular: No chest pain, palpitations Respiratory:  No shortness of breath at rest or with exertion, wheezes GastrointestinaI: No nausea, vomiting, diarrhea, abdominal pain, fecal incontinence Genitourinary:  No dysuria, urinary retention or frequency Musculoskeletal:  No neck pain, back pain Integumentary: No rash, pruritus, skin  lesions Neurological: as above Psychiatric: No depression, insomnia, anxiety Endocrine: No palpitations, fatigue, diaphoresis, mood swings, change in appetite, change in weight, increased thirst Hematologic/Lymphatic:  No anemia, purpura, petechiae. Allergic/Immunologic: no itchy/runny eyes, nasal congestion, recent allergic reactions, rashes  PHYSICAL EXAM: Vitals:   11/09/16 1144  BP: (!) 142/76  Pulse: 84   General: No acute distress Head:  Normocephalic/atraumatic Neck: supple, no paraspinal tenderness, full range of motion Heart:  Regular rate and rhythm Lungs:  Clear to auscultation bilaterally Back: No paraspinal tenderness Skin/Extremities: No rash, no edema Neurological Exam: alert and oriented to person, place, and time. No aphasia or dysarthria. Fund of knowledge is appropriate.  Recent and remote memory are intact.  Attention and concentration  are normal.    Able to name objects and repeat phrases. CDT 4/5  MMSE - Mini Mental State Exam 11/09/2016 11/10/2015  Orientation to time 4 3  Orientation to Place 5 5  Registration 3 3  Attention/ Calculation 5 5  Recall 1 2  Language- name 2 objects 2 2  Language- repeat 1 1  Language- follow 3 step command 3 3  Language- read & follow direction 1 1  Write a sentence 1 1  Copy design 1 1  Total score 27 27   Cranial nerves: Pupils equal, round, reactive to light.  Extraocular movements intact with no nystagmus. Visual fields full. Facial sensation intact. No facial asymmetry. Tongue, uvula, palate midline.  Motor: Bulk and tone normal, muscle strength 5/5 throughout with no pronator drift.  Sensation to light touch intact.  No extinction to double simultaneous stimulation.  Deep tendon reflexes 2+ throughout, toes downgoing.  Finger to nose testing intact.  Gait narrow-based and steady, able to tandem walk adequately.  Romberg negative.  IMPRESSION: This is a pleasant 66 yo RH man with a history of alcohol abuse, hepatitis C,  liver cirrhosis, recently diagnosed hepatocellular carcinoma, with worsening memory. Neurological exam non-focal, MMSE today again 27/30, similar to a year ago. Brain imaging no acute changes. He reports that he stopped alcohol. No indication to start cholinesterase inhibitors such as Aricept at this time. We again discussed the importance of control of vascular risk factors, physical exercise, and brain stimulation exercises for brain health. He will discuss swelling with his PCP/GI specialist. He will follow-up in 1 year or earlier if needed.   Thank you for allowing me to participate in his care.  Please do not hesitate to call for any questions or concerns.  The duration of this appointment visit was 25 minutes of face-to-face time with the patient.  Greater than 50% of this time was spent in counseling, explanation of diagnosis, planning of further management, and coordination of care.   Ellouise Newer, M.D.   CC: Adrian Prince

## 2016-11-09 NOTE — Patient Instructions (Addendum)
We will see you again next year to continue to monitor memory. Physical exercise and brain stimulation exercises are important for brain health. Discuss shortness of breath and swelling with your doctor.

## 2016-11-10 ENCOUNTER — Ambulatory Visit (AMBULATORY_SURGERY_CENTER): Payer: Self-pay | Admitting: *Deleted

## 2016-11-10 VITALS — Ht 66.6 in | Wt 154.0 lb

## 2016-11-10 DIAGNOSIS — I8500 Esophageal varices without bleeding: Secondary | ICD-10-CM

## 2016-11-10 NOTE — Progress Notes (Signed)
Patient denies any allergies to eggs or soy. Patient denies any problems with anesthesia/sedation. Patient denies any oxygen use at home and does not take any diet/weight loss medications. Patient's wife Levada Dy requesting him to have pain medications. Encouraged them to go see his PCP.
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
## 2016-11-12 ENCOUNTER — Ambulatory Visit (HOSPITAL_BASED_OUTPATIENT_CLINIC_OR_DEPARTMENT_OTHER): Payer: Medicare HMO

## 2016-11-12 ENCOUNTER — Encounter: Payer: Self-pay | Admitting: *Deleted

## 2016-11-12 ENCOUNTER — Ambulatory Visit (HOSPITAL_BASED_OUTPATIENT_CLINIC_OR_DEPARTMENT_OTHER): Payer: Medicare HMO | Admitting: Hematology

## 2016-11-12 ENCOUNTER — Telehealth: Payer: Self-pay | Admitting: Hematology

## 2016-11-12 ENCOUNTER — Encounter: Payer: Self-pay | Admitting: Hematology

## 2016-11-12 ENCOUNTER — Telehealth: Payer: Self-pay | Admitting: *Deleted

## 2016-11-12 VITALS — BP 139/71 | HR 60 | Temp 97.5°F | Resp 17 | Ht 66.6 in | Wt 153.3 lb

## 2016-11-12 DIAGNOSIS — K7460 Unspecified cirrhosis of liver: Secondary | ICD-10-CM

## 2016-11-12 DIAGNOSIS — F1010 Alcohol abuse, uncomplicated: Secondary | ICD-10-CM

## 2016-11-12 DIAGNOSIS — Z720 Tobacco use: Secondary | ICD-10-CM | POA: Diagnosis not present

## 2016-11-12 DIAGNOSIS — K7031 Alcoholic cirrhosis of liver with ascites: Secondary | ICD-10-CM

## 2016-11-12 DIAGNOSIS — R188 Other ascites: Secondary | ICD-10-CM

## 2016-11-12 DIAGNOSIS — C220 Liver cell carcinoma: Secondary | ICD-10-CM

## 2016-11-12 DIAGNOSIS — B1920 Unspecified viral hepatitis C without hepatic coma: Secondary | ICD-10-CM

## 2016-11-12 DIAGNOSIS — R609 Edema, unspecified: Secondary | ICD-10-CM | POA: Diagnosis not present

## 2016-11-12 LAB — CBC WITH DIFFERENTIAL/PLATELET
BASO%: 0.9 % (ref 0.0–2.0)
BASOS ABS: 0.1 10*3/uL (ref 0.0–0.1)
EOS%: 4.9 % (ref 0.0–7.0)
Eosinophils Absolute: 0.3 10*3/uL (ref 0.0–0.5)
HEMATOCRIT: 30 % — AB (ref 38.4–49.9)
HEMOGLOBIN: 10.1 g/dL — AB (ref 13.0–17.1)
LYMPH#: 1.7 10*3/uL (ref 0.9–3.3)
LYMPH%: 27.8 % (ref 14.0–49.0)
MCH: 35.7 pg — AB (ref 27.2–33.4)
MCHC: 33.6 g/dL (ref 32.0–36.0)
MCV: 106.1 fL — ABNORMAL HIGH (ref 79.3–98.0)
MONO#: 0.9 10*3/uL (ref 0.1–0.9)
MONO%: 14 % (ref 0.0–14.0)
NEUT#: 3.2 10*3/uL (ref 1.5–6.5)
NEUT%: 52.4 % (ref 39.0–75.0)
Platelets: 165 10*3/uL (ref 140–400)
RBC: 2.82 10*6/uL — ABNORMAL LOW (ref 4.20–5.82)
RDW: 15.6 % — AB (ref 11.0–14.6)
WBC: 6.1 10*3/uL (ref 4.0–10.3)

## 2016-11-12 LAB — PROTIME-INR
INR: 1.3 — ABNORMAL LOW (ref 2.00–3.50)
Protime: 15.6 Seconds — ABNORMAL HIGH (ref 10.6–13.4)

## 2016-11-12 LAB — COMPREHENSIVE METABOLIC PANEL
ALBUMIN: 2.2 g/dL — AB (ref 3.5–5.0)
ALT: 32 U/L (ref 0–55)
AST: 66 U/L — AB (ref 5–34)
Alkaline Phosphatase: 144 U/L (ref 40–150)
Anion Gap: 6 mEq/L (ref 3–11)
BUN: 18.1 mg/dL (ref 7.0–26.0)
CALCIUM: 8.2 mg/dL — AB (ref 8.4–10.4)
CHLORIDE: 108 meq/L (ref 98–109)
CO2: 27 mEq/L (ref 22–29)
CREATININE: 1.2 mg/dL (ref 0.7–1.3)
EGFR: 72 mL/min/{1.73_m2} — ABNORMAL LOW (ref >90)
GLUCOSE: 75 mg/dL (ref 70–140)
POTASSIUM: 3.5 meq/L (ref 3.5–5.1)
SODIUM: 141 meq/L (ref 136–145)
Total Bilirubin: 2.61 mg/dL — ABNORMAL HIGH (ref 0.20–1.20)
Total Protein: 7.3 g/dL (ref 6.4–8.3)

## 2016-11-12 NOTE — Progress Notes (Signed)
[place] Work  Clinical Social Work was referred by Futures trader for assistance with ADRs.  Clinical Education officer, museum met with briefly with patient and family at [facility] to provide ADR packet and discuss process. Pt provided with CSW contact and pt plans to schedule appt to complete next week.     Clinical Social Work interventions: ADR education  Loren Racer, [place] Worker [place]  [place] Phone: [phone] Fax: [phone]

## 2016-11-12 NOTE — Telephone Encounter (Signed)
-----   Message from Jerene Bears, MD sent at 11/12/2016  4:12 PM EST ----- Needs OV with me or APP next week Decompensated cirrhosis and recent hospitalization JMP  ----- Message ----- From: Truitt Merle, MD Sent: 11/12/2016   1:44 PM To: Jerene Bears, MD  Hannah Beat,  I saw him for follow up of his [place]. He is not doing well, with significant leg swelling, was admitted for GI bleeding early this month, went ED last week for leg swelling, on lasix and Aldactone. I'll repeat lab today, and increased his Lasix dose.  Could you see him as soon as possible? He is scheduled to have a EGD with you next week on January 30.  Thanks  Krista Blue

## 2016-11-12 NOTE — Telephone Encounter (Signed)
Lab appointment added for today. Appointments scheduled per 11/12/16 los. Patient was given a copy of the AVS report and appointment schedule per 11/12/16 los.

## 2016-11-12 NOTE — Telephone Encounter (Signed)
I have spoken to patient's wife and patient has been scheduled for 11/17/16 @ 3 pm with Alonza Bogus, PA-C as per Dr Vena Rua recommendations. She verbalizes understanding.

## 2016-11-13 LAB — AFP TUMOR MARKER: AFP, Serum, Tumor Marker: 5.7 ng/mL (ref 0.0–8.3)

## 2016-11-16 ENCOUNTER — Ambulatory Visit (HOSPITAL_COMMUNITY)
Admission: RE | Admit: 2016-11-16 | Discharge: 2016-11-16 | Disposition: A | Discharge status: Home / Self Care | Payer: Medicare HMO | Source: Ambulatory Visit | Attending: Internal Medicine | Admitting: Internal Medicine

## 2016-11-16 ENCOUNTER — Telehealth: Payer: Self-pay | Admitting: *Deleted

## 2016-11-16 ENCOUNTER — Encounter: Payer: Self-pay | Admitting: *Deleted

## 2016-11-16 ENCOUNTER — Ambulatory Visit (HOSPITAL_COMMUNITY): Payer: Medicare HMO | Admitting: Certified Registered Nurse Anesthetist

## 2016-11-16 ENCOUNTER — Encounter (HOSPITAL_COMMUNITY): Payer: Self-pay

## 2016-11-16 ENCOUNTER — Encounter (HOSPITAL_COMMUNITY)
Admission: RE | Disposition: A | Discharge status: Home / Self Care | Payer: Self-pay | Source: Ambulatory Visit | Attending: Internal Medicine

## 2016-11-16 ENCOUNTER — Other Ambulatory Visit: Payer: Self-pay | Admitting: *Deleted

## 2016-11-16 DIAGNOSIS — Z539 Procedure and treatment not carried out, unspecified reason: Secondary | ICD-10-CM | POA: Insufficient documentation

## 2016-11-16 DIAGNOSIS — K922 Gastrointestinal hemorrhage, unspecified: Secondary | ICD-10-CM

## 2016-11-16 SURGERY — CANCELLED PROCEDURE
Anesthesia: Monitor Anesthesia Care

## 2016-11-16 MED ORDER — LIDOCAINE 2% (20 MG/ML) 5 ML SYRINGE
INTRAMUSCULAR | Status: AC
  Filled 2016-11-16: qty 5

## 2016-11-16 MED ORDER — PROPOFOL 10 MG/ML IV BOLUS
INTRAVENOUS | Status: AC
  Filled 2016-11-16: qty 20

## 2016-11-16 MED ORDER — SODIUM CHLORIDE 0.9 % IV SOLN: INTRAVENOUS | Status: DC

## 2016-11-16 SURGICAL SUPPLY — 15 items

## 2016-11-16 NOTE — Telephone Encounter (Signed)
I have rescheduled patient's endoscopy to Wednesday, 12/15/16 @ 11 am. 9:30 am arrival. He will be instructed at his office visit later this week.

## 2016-11-16 NOTE — Anesthesia Preprocedure Evaluation (Deleted)
Anesthesia Evaluation  Patient identified by MRN, date of birth, ID band Patient awake    Reviewed: Allergy & Precautions, NPO status , Patient's Chart, lab work & pertinent test results  Airway Mallampati: II  TM Distance: >3 FB Neck ROM: Full    Dental no notable dental hx.    Pulmonary Current Smoker,    Pulmonary exam normal breath sounds clear to auscultation       Cardiovascular negative cardio ROS Normal cardiovascular exam Rhythm:Regular Rate:Normal     Neuro/Psych negative neurological ROS  negative psych ROS   GI/Hepatic negative GI ROS, (+) Cirrhosis     substance abuse  alcohol use, Hepatitis -, C  Endo/Other  negative endocrine ROS  Renal/GU negative Renal ROS  negative genitourinary   Musculoskeletal negative musculoskeletal ROS (+)   Abdominal   Peds negative pediatric ROS (+)  Hematology  (+) anemia ,   Anesthesia Other Findings   Reproductive/Obstetrics negative OB ROS                             Anesthesia Physical Anesthesia Plan  ASA: III  Anesthesia Plan: MAC   Post-op Pain Management:    Induction: Intravenous  Airway Management Planned: Nasal Cannula  Additional Equipment:   Intra-op Plan:   Post-operative Plan:   Informed Consent: I have reviewed the patients History and Physical, chart, labs and discussed the procedure including the risks, benefits and alternatives for the proposed anesthesia with the patient or authorized representative who has indicated his/her understanding and acceptance.   Dental advisory given  Plan Discussed with: CRNA and Surgeon  Anesthesia Plan Comments:         Anesthesia Quick Evaluation

## 2016-11-16 NOTE — Progress Notes (Signed)
[place] [facility]  Clinical Social Work was referred by patient to review and complete healthcare advance directives.  Clinical Social Worker met with patient and patients spouse  in [place] office.  The patient designated Izaias Krupka as their primary healthcare agent and Anola Gurney as their secondary agent.  Patient also completed healthcare living will.    Clinical Social Worker notarized documents and made copies for patient/family. Clinical Social Worker will send documents to medical records to be scanned into patient's chart. Clinical Social Worker encouraged patient/family to contact with any additional questions or concerns.  Johnnye Lana, MSW, LCSW, OSW-C Clinical Social Worker [facility] [phone]
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
## 2016-11-16 NOTE — Telephone Encounter (Signed)
-----   Message from Jerene Bears, MD sent at 11/16/2016 10:16 AM EST ----- Regarding: EGD He ate egg sandwich this morning and EGD canceled by anesthesia Needs repeat EGD with banding protocol Can use next hospital week, Wed 830 or 9 am with MAC JMP

## 2016-11-16 NOTE — Progress Notes (Signed)
During the admission process it was discovered that the patient ate an egg sandwich and coffee this morning. Dr. Kalman Shan was notified and the procedure was cancelled.

## 2016-11-17 ENCOUNTER — Ambulatory Visit (INDEPENDENT_AMBULATORY_CARE_PROVIDER_SITE_OTHER): Payer: Medicare HMO | Admitting: Gastroenterology

## 2016-11-17 ENCOUNTER — Other Ambulatory Visit (INDEPENDENT_AMBULATORY_CARE_PROVIDER_SITE_OTHER): Payer: Medicare HMO

## 2016-11-17 ENCOUNTER — Telehealth: Payer: Self-pay | Admitting: Pharmacist Clinician (PhC)/ Clinical Pharmacy Specialist

## 2016-11-17 ENCOUNTER — Encounter: Payer: Self-pay | Admitting: Gastroenterology

## 2016-11-17 VITALS — BP 118/82 | HR 67 | Ht 66.0 in | Wt 159.0 lb

## 2016-11-17 DIAGNOSIS — I8500 Esophageal varices without bleeding: Secondary | ICD-10-CM | POA: Diagnosis not present

## 2016-11-17 DIAGNOSIS — K7031 Alcoholic cirrhosis of liver with ascites: Secondary | ICD-10-CM | POA: Diagnosis not present

## 2016-11-17 DIAGNOSIS — R188 Other ascites: Secondary | ICD-10-CM | POA: Insufficient documentation

## 2016-11-17 DIAGNOSIS — K7030 Alcoholic cirrhosis of liver without ascites: Secondary | ICD-10-CM

## 2016-11-17 DIAGNOSIS — I8511 Secondary esophageal varices with bleeding: Secondary | ICD-10-CM | POA: Diagnosis not present

## 2016-11-17 LAB — PROTIME-INR
INR: 1.4 ratio — ABNORMAL HIGH (ref 0.8–1.0)
Prothrombin Time: 14.8 s — ABNORMAL HIGH (ref 9.6–13.1)

## 2016-11-17 LAB — COMPREHENSIVE METABOLIC PANEL
ALBUMIN: 2.5 g/dL — AB (ref 3.5–5.2)
ALK PHOS: 126 U/L — AB (ref 39–117)
ALT: 26 U/L (ref 0–53)
AST: 57 U/L — ABNORMAL HIGH (ref 0–37)
BUN: 13 mg/dL (ref 6–23)
CALCIUM: 8.1 mg/dL — AB (ref 8.4–10.5)
CHLORIDE: 104 meq/L (ref 96–112)
CO2: 28 mEq/L (ref 19–32)
Creatinine, Ser: 0.98 mg/dL (ref 0.40–1.50)
GFR: 98.54 mL/min (ref >60.00)
Glucose, Bld: 101 mg/dL — ABNORMAL HIGH (ref 70–99)
POTASSIUM: 3.8 meq/L (ref 3.5–5.1)
SODIUM: 136 meq/L (ref 135–145)
TOTAL PROTEIN: 7.7 g/dL (ref 6.0–8.3)
Total Bilirubin: 2.3 mg/dL — ABNORMAL HIGH (ref 0.2–1.2)

## 2016-11-17 LAB — CBC WITH DIFFERENTIAL/PLATELET
Basophils Absolute: 0.1 10*3/uL (ref 0.0–0.1)
Basophils Relative: 2 % (ref 0.0–3.0)
EOS PCT: 7.3 % — AB (ref 0.0–5.0)
Eosinophils Absolute: 0.3 10*3/uL (ref 0.0–0.7)
HCT: 32.3 % — ABNORMAL LOW (ref 39.0–52.0)
Lymphocytes Relative: 31.1 % (ref 12.0–46.0)
Lymphs Abs: 1.3 10*3/uL (ref 0.7–4.0)
MCHC: 34.1 g/dL (ref 30.0–36.0)
MCV: 104.8 fl — AB (ref 78.0–100.0)
MONO ABS: 0.4 10*3/uL (ref 0.1–1.0)
MONOS PCT: 9.6 % (ref 3.0–12.0)
Neutro Abs: 2.1 10*3/uL (ref 1.4–7.7)
Neutrophils Relative %: 50 % (ref 43.0–77.0)
Platelets: 157 10*3/uL (ref 150.0–400.0)
RBC: 3.08 Mil/uL — AB (ref 4.22–5.81)
RDW: 14.8 % (ref 11.5–15.5)
WBC: 4.1 10*3/uL (ref 4.0–10.5)

## 2016-11-17 NOTE — Telephone Encounter (Signed)
Wife will try to coordinate her schedule so she can bring him in. She will call back for an appt tomorrow.
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
## 2016-11-17 NOTE — Patient Instructions (Addendum)
Increase Aldactone to 100 mg daily.  Your physician has requested that you go to the basement for lab work in one week.  Low sodium diet >2 grams

## 2016-11-18 ENCOUNTER — Other Ambulatory Visit: Payer: Self-pay | Admitting: Emergency Medicine

## 2016-11-18 ENCOUNTER — Telehealth: Payer: Self-pay | Admitting: Pharmacist Clinician (PhC)/ Clinical Pharmacy Specialist

## 2016-11-18 ENCOUNTER — Ambulatory Visit: Payer: Medicare HMO | Admitting: Physician Assistant

## 2016-11-18 DIAGNOSIS — K7031 Alcoholic cirrhosis of liver with ascites: Secondary | ICD-10-CM

## 2016-11-18 NOTE — Telephone Encounter (Signed)
Talked to Levada Dy today to bring him back on Monday so we can do the [facility] and meet with pharmacy for a short time.

## 2016-11-19 ENCOUNTER — Telehealth: Payer: Self-pay | Admitting: *Deleted

## 2016-11-19 NOTE — Telephone Encounter (Signed)
-----   Message from Truitt Merle, MD sent at 11/13/2016  3:46 PM EST ----- Please let pt know the lab result, AFP normal, no high concern for liver cancer recurrence. Abnormal LFTs are stable, kidney function normal.   Thanks  Truitt Merle

## 2016-11-19 NOTE — Telephone Encounter (Signed)
Called & spoke to wife & gave results of labs per Dr Ernestina Penna request.  Wife reports that pt feels tired all the time & has swelling & she just wonders why.  Informed that hgb low but better than last time but fatigue could be related to that plus liver issues.

## 2016-11-22 ENCOUNTER — Other Ambulatory Visit: Payer: Medicare HMO

## 2016-11-22 ENCOUNTER — Ambulatory Visit (INDEPENDENT_AMBULATORY_CARE_PROVIDER_SITE_OTHER): Payer: Medicare HMO | Admitting: Pharmacist Clinician (PhC)/ Clinical Pharmacy Specialist

## 2016-11-22 DIAGNOSIS — B182 Chronic viral hepatitis C: Secondary | ICD-10-CM

## 2016-11-22 NOTE — Progress Notes (Addendum)
HPI: Peter Nelson is a 66 y.o. male who is finally here to f/u for his SVR12.  Lab Results  Component Value Date   HCVGENOTYPE 1a 08/18/2015    Allergies: Allergies  Allergen Reactions  . Aspirin Other (See Comments)    Does not take because of hep c    Vitals:    Past Medical History: Past Medical History:  Diagnosis Date  . Alcohol abuse   . Allergy   . Arthritis   . Asthma   . Cancer ([place])    liver  . Hepatitis C   . Hepatocellular carcinoma ([place])   . Hyperplastic colon polyp   . Internal hemorrhoids   . Shortness of breath dyspnea    with activity and anxiety  . Ulcerative colitis ([place])   . Wears glasses     Social History: Social History   Social History  . Marital status: Married    Spouse name: [name]  . Number of children: 89  . Years of education: [name]   Social History Main Topics  . Smoking status: Current Every Day Smoker    Packs/day: 0.25    Years: 55.00    Types: Cigarettes  . Smokeless tobacco: Never Used     Comment: cutting back  . Alcohol use No     Comment: "a 1/2 pint a day" for more than 30 years, average 1 pint a day, cut back lately   . Drug use: No  . Sexual activity: Not on file   Other Topics Concern  . Not on file   Social History Narrative   Married Dec 22nd, 2016 to [place]   Has #9 children    Labs: Hepatitis B Surface Ag (no units)  Date Value  08/18/2015 NEGATIVE    Lab Results  Component Value Date   HCVGENOTYPE 1a 08/18/2015    Hepatitis C RNA quantitative Latest Ref Rng & Units 02/19/2016 12/16/2015 08/18/2015  HCV Quantitative <15 IU/mL Not Detected <15 1,579,038(H)  HCV Quantitative Log <1.18 log 10 NOT CALC <1.18 6.20(H)    AST (U/L)  Date Value  04-Feb-202018 57 (H)  11/12/2016 66 (H)  11/05/2016 78 (H)  10/28/2016 85 (H)  07/13/2016 152 (H)  12/02/2015 104 (H)   ALT (U/L)  Date Value  04-Feb-202018 26  11/12/2016 32  11/05/2016 36  10/28/2016 33  07/13/2016 61 (H)  12/02/2015 70 (H)   INR   Date Value  04-Feb-202018 1.4 ratio (H)  11/12/2016 1.30 (L)  10/28/2016 1.68  10/25/2016 1.61  07/13/2016 1.30 (L)  11/13/2015 1.10 (L)    CrCl: Estimated Creatinine Clearance: 67.8 mL/min (by C-G formula based on SCr of 0.98 mg/dL).  Fibrosis Score:  F3/4 as assessed by ARFI  Child-Pugh Score: Class B  Previous Treatment Regimen: Naive  Assessment:  Peter Nelson hasn't f/u with [place] in a while so we brought him back today to do his SVR12. He has a hx of chronic alcoholism. He said that the last time he had a drink was 6 wks ago. He was drinking half a pint a day at that time. Strongly advised him to quit completely. He has a hx of hepatocarcinoma already. Since it was so long ago, he doesn't remember about the missing doses with his hep C meds. He said he took both [place] and riba for 3 months. His first VL was still detectable but less than 15 and the second one was undetectable.   Recommendations:  SVR12 today  Alya Smaltz, [place].D., BCPS, AAHIVP  Clinical Infectious Disease [facility] 11/22/2016, 3:24 PM

## 2016-11-25 ENCOUNTER — Encounter: Payer: Self-pay | Admitting: *Deleted

## 2016-11-25 ENCOUNTER — Other Ambulatory Visit (INDEPENDENT_AMBULATORY_CARE_PROVIDER_SITE_OTHER): Payer: Medicare HMO

## 2016-11-25 DIAGNOSIS — K7031 Alcoholic cirrhosis of liver with ascites: Secondary | ICD-10-CM | POA: Diagnosis not present

## 2016-11-25 LAB — CBC WITH DIFFERENTIAL/PLATELET
BASOS ABS: 0.1 10*3/uL (ref 0.0–0.1)
BASOS PCT: 1.6 % (ref 0.0–3.0)
EOS ABS: 0.3 10*3/uL (ref 0.0–0.7)
Eosinophils Relative: 5.1 % — ABNORMAL HIGH (ref 0.0–5.0)
HEMATOCRIT: 30.1 % — AB (ref 39.0–52.0)
HEMOGLOBIN: 10.1 g/dL — AB (ref 13.0–17.0)
LYMPHS PCT: 38.6 % (ref 12.0–46.0)
Lymphs Abs: 2 10*3/uL (ref 0.7–4.0)
MCHC: 33.7 g/dL (ref 30.0–36.0)
MCV: 104.7 fl — AB (ref 78.0–100.0)
MONOS PCT: 11.9 % (ref 3.0–12.0)
Monocytes Absolute: 0.6 10*3/uL (ref 0.1–1.0)
Neutro Abs: 2.2 10*3/uL (ref 1.4–7.7)
Neutrophils Relative %: 42.8 % — ABNORMAL LOW (ref 43.0–77.0)
Platelets: 134 10*3/uL — ABNORMAL LOW (ref 150.0–400.0)
RBC: 2.87 Mil/uL — ABNORMAL LOW (ref 4.22–5.81)
RDW: 14.8 % (ref 11.5–15.5)
WBC: 5.2 10*3/uL (ref 4.0–10.5)

## 2016-11-25 LAB — COMPREHENSIVE METABOLIC PANEL
ALBUMIN: 2.4 g/dL — AB (ref 3.5–5.2)
ALT: 22 U/L (ref 0–53)
AST: 47 U/L — AB (ref 0–37)
Alkaline Phosphatase: 108 U/L (ref 39–117)
BILIRUBIN TOTAL: 2.1 mg/dL — AB (ref 0.2–1.2)
BUN: 13 mg/dL (ref 6–23)
CHLORIDE: 108 meq/L (ref 96–112)
CO2: 28 mEq/L (ref 19–32)
CREATININE: 0.89 mg/dL (ref 0.40–1.50)
Calcium: 8.3 mg/dL — ABNORMAL LOW (ref 8.4–10.5)
GFR: 110.12 mL/min (ref >60.00)
Glucose, Bld: 114 mg/dL — ABNORMAL HIGH (ref 70–99)
Potassium: 4.2 mEq/L (ref 3.5–5.1)
Sodium: 139 mEq/L (ref 135–145)
Total Protein: 7.5 g/dL (ref 6.0–8.3)

## 2016-11-25 LAB — PROTIME-INR
INR: 1.5 ratio — ABNORMAL HIGH (ref 0.8–1.0)
PROTHROMBIN TIME: 15.4 s — AB (ref 9.6–13.1)

## 2016-11-25 LAB — HEPATITIS C RNA QUANTITATIVE
HCV QUANT: NOT DETECTED [IU]/mL
HCV Quantitative Log: <1.18 Log IU/mL

## 2016-11-25 NOTE — Progress Notes (Signed)
[place] [facility]  Clinical Social Work was referred by pt to re-review and re-complete healthcare advance directives.  Pt requested to change HCPOA on his current paperwork. Clinical Social Worker met with patient in [place] office.  The patient designated Garfield Coiner [phone]) as their primary healthcare agent and Markevion Lattin [phone])  as their secondary agent.  Patient also completed healthcare living will.    Clinical Social Worker notarized documents and made copies for patient/family. Clinical Social Worker will send documents to medical records to be scanned into patient's chart. Clinical Social Worker encouraged patient/family to contact with any additional questions or concerns.  Loren Racer, LCSW, OSW-C Clinical Social Worker [place]  [facility] Phone: [phone] Fax: [phone]

## 2016-12-02 ENCOUNTER — Other Ambulatory Visit: Payer: Medicare Other

## 2016-12-02 ENCOUNTER — Ambulatory Visit: Payer: Medicare HMO | Admitting: Gastroenterology

## 2016-12-15 ENCOUNTER — Ambulatory Visit (HOSPITAL_COMMUNITY): Payer: Medicare HMO | Admitting: Anesthesiology

## 2016-12-15 ENCOUNTER — Encounter (HOSPITAL_COMMUNITY): Payer: Self-pay | Admitting: Anesthesiology

## 2016-12-15 ENCOUNTER — Encounter (HOSPITAL_COMMUNITY)
Admission: RE | Disposition: A | Discharge status: Home / Self Care | Payer: Self-pay | Source: Ambulatory Visit | Attending: Internal Medicine

## 2016-12-15 ENCOUNTER — Ambulatory Visit (HOSPITAL_COMMUNITY)
Admission: RE | Admit: 2016-12-15 | Discharge: 2016-12-15 | Disposition: A | Discharge status: Home / Self Care | Payer: Medicare HMO | Source: Ambulatory Visit | Attending: Internal Medicine | Admitting: Internal Medicine

## 2016-12-15 DIAGNOSIS — K922 Gastrointestinal hemorrhage, unspecified: Secondary | ICD-10-CM

## 2016-12-15 DIAGNOSIS — K7030 Alcoholic cirrhosis of liver without ascites: Secondary | ICD-10-CM

## 2016-12-15 DIAGNOSIS — M1990 Unspecified osteoarthritis, unspecified site: Secondary | ICD-10-CM | POA: Insufficient documentation

## 2016-12-15 DIAGNOSIS — K449 Diaphragmatic hernia without obstruction or gangrene: Secondary | ICD-10-CM | POA: Insufficient documentation

## 2016-12-15 DIAGNOSIS — K3189 Other diseases of stomach and duodenum: Secondary | ICD-10-CM | POA: Diagnosis not present

## 2016-12-15 DIAGNOSIS — F17210 Nicotine dependence, cigarettes, uncomplicated: Secondary | ICD-10-CM | POA: Insufficient documentation

## 2016-12-15 DIAGNOSIS — F17200 Nicotine dependence, unspecified, uncomplicated: Secondary | ICD-10-CM | POA: Diagnosis not present

## 2016-12-15 DIAGNOSIS — K7031 Alcoholic cirrhosis of liver with ascites: Secondary | ICD-10-CM | POA: Insufficient documentation

## 2016-12-15 DIAGNOSIS — Z8505 Personal history of malignant neoplasm of liver: Secondary | ICD-10-CM | POA: Diagnosis not present

## 2016-12-15 DIAGNOSIS — B1920 Unspecified viral hepatitis C without hepatic coma: Secondary | ICD-10-CM | POA: Insufficient documentation

## 2016-12-15 DIAGNOSIS — I8500 Esophageal varices without bleeding: Secondary | ICD-10-CM | POA: Insufficient documentation

## 2016-12-15 DIAGNOSIS — Z08 Encounter for follow-up examination after completed treatment for malignant neoplasm: Secondary | ICD-10-CM | POA: Diagnosis not present

## 2016-12-15 DIAGNOSIS — K766 Portal hypertension: Secondary | ICD-10-CM | POA: Diagnosis not present

## 2016-12-15 DIAGNOSIS — R55 Syncope and collapse: Secondary | ICD-10-CM | POA: Diagnosis not present

## 2016-12-15 DIAGNOSIS — J45909 Unspecified asthma, uncomplicated: Secondary | ICD-10-CM | POA: Diagnosis not present

## 2016-12-15 DIAGNOSIS — I8510 Secondary esophageal varices without bleeding: Secondary | ICD-10-CM

## 2016-12-15 DIAGNOSIS — Z8719 Personal history of other diseases of the digestive system: Secondary | ICD-10-CM

## 2016-12-15 HISTORY — PX: ESOPHAGEAL BANDING: SHX5518

## 2016-12-15 HISTORY — PX: ESOPHAGOGASTRODUODENOSCOPY (EGD) WITH PROPOFOL: SHX5813

## 2016-12-15 SURGERY — ESOPHAGOGASTRODUODENOSCOPY (EGD) WITH PROPOFOL
Anesthesia: Monitor Anesthesia Care

## 2016-12-15 MED ORDER — PROPOFOL 10 MG/ML IV BOLUS
INTRAVENOUS | Status: AC
  Filled 2016-12-15: qty 20

## 2016-12-15 MED ORDER — PROPOFOL 500 MG/50ML IV EMUL
INTRAVENOUS | Status: DC | PRN
  Administered 2016-12-15: 100 ug/kg/min via INTRAVENOUS

## 2016-12-15 MED ORDER — SODIUM CHLORIDE 0.9 % IV SOLN: INTRAVENOUS | Status: DC

## 2016-12-15 MED ORDER — LACTATED RINGERS IV SOLN
INTRAVENOUS | Status: DC
  Administered 2016-12-15: 1000 mL via INTRAVENOUS

## 2016-12-15 MED ORDER — PROPOFOL 10 MG/ML IV BOLUS
INTRAVENOUS | Status: DC | PRN
  Administered 2016-12-15: 20 mg via INTRAVENOUS
  Administered 2016-12-15 (×3): 40 mg via INTRAVENOUS
  Administered 2016-12-15: 20 mg via INTRAVENOUS
  Administered 2016-12-15: 40 mg via INTRAVENOUS
  Administered 2016-12-15 (×2): 20 mg via INTRAVENOUS

## 2016-12-15 SURGICAL SUPPLY — 15 items

## 2016-12-15 NOTE — Anesthesia Postprocedure Evaluation (Signed)
Anesthesia Post Note  Patient: Peter Nelson  Procedure(s) Performed: Procedure(s) (LRB): ESOPHAGOGASTRODUODENOSCOPY (EGD) WITH PROPOFOL ([name]) ESOPHAGEAL BANDING ([name])  Patient location during evaluation: PACU Anesthesia Type: MAC Level of consciousness: awake and alert and oriented Pain management: pain level controlled Vital Signs Assessment: post-procedure vital signs reviewed and stable Respiratory status: spontaneous breathing, nonlabored ventilation and respiratory function stable Cardiovascular status: stable and blood pressure returned to baseline Postop Assessment: no signs of nausea or vomiting Anesthetic complications: no       Last Vitals:  Vitals:   12/15/16 1038 12/15/16 1229  BP: (!) 160/76 (!) 156/105  Pulse: 62 90  Resp: 15 16  Temp: 36.4 C     Last Pain:  Vitals:   12/15/16 1038  TempSrc: Oral                 Stone Spirito A.

## 2016-12-15 NOTE — Op Note (Signed)
[facility] Patient Name: Peter Nelson Procedure Date: 12/15/2016 MRN: [id number] Attending MD: Jerene Bears , MD Date of Birth: 1951-06-08 CSN: [id number] Age: 66 Admit Type: Outpatient Procedure:                Upper GI endoscopy Indications:              Follow-up of esophageal varices, previous EVL Jan                            2018 Providers:                Lajuan Lines. [name], MD, Vista Lawman, RN, Cherylynn Ridges,                            Technician, Anne Fu CRNA, CRNA Referring MD:              Medicines:                Monitored Anesthesia Care Complications:            No immediate complications. Estimated Blood Loss:     Estimated blood loss was minimal. Procedure:                Pre-Anesthesia Assessment:                           - Prior to the procedure, a History and Physical                            was performed, and patient medications and                            allergies were reviewed. The patient's tolerance of                            previous anesthesia was also reviewed. The risks                            and benefits of the procedure and the sedation                            options and risks were discussed with the patient.                            All questions were answered, and informed consent                            was obtained. Prior Anticoagulants: The patient has                            taken no previous anticoagulant or antiplatelet                            agents. ASA Grade Assessment: III - A patient with  severe systemic disease. After reviewing the risks                            and benefits, the patient was deemed in                            satisfactory condition to undergo the procedure.                           After obtaining informed consent, the endoscope was                            passed under direct vision. Throughout the                            procedure,  the patient's blood pressure, pulse, and                            oxygen saturations were monitored continuously. The                            EG-[id number] ([id number]) scope was introduced through the                            mouth, and advanced to the second part of duodenum.                            The upper GI endoscopy was accomplished without                            difficulty. The patient tolerated the procedure                            well. Scope In: Scope Out: Findings:      Non-bleeding grade II varices were found in the lower third of the       esophagus,. No stigmata of recent bleeding were evident and small red       wale signs were present. Evidence of partial eradication was visible.       The varices appeared smaller than they were at prior exam. Four bands       were successfully placed with complete eradication, resulting in       deflation of varices. There was no bleeding at the end of the procedure.      A 3 cm hiatal hernia was present.      Mild portal hypertensive gastropathy was found in the cardia, in the       gastric fundus and in the gastric body.      The examined duodenum was normal. Impression:               - Non-bleeding grade II esophageal varices. Banded.                           - 3 cm hiatal hernia.                           -  Portal hypertensive gastropathy.                           - Normal examined duodenum.                           - No specimens collected. Moderate Sedation:      [name] Recommendation:           - Patient has a contact number available for                            emergencies. The signs and symptoms of potential                            delayed complications were discussed with the                            patient. Return to normal activities tomorrow.                            Written discharge instructions were provided to the                            patient.                           - Low sodium diet  indefinitely.                           - Continue present medications.                           - Repeat upper endoscopy in 4-6 weeks to evaluate                            the response to therapy.                           - No alcohol. Procedure Code(s):        --- Professional ---                           [phone], Esophagogastroduodenoscopy, flexible,                            transoral; with band ligation of esophageal/gastric                            varices Diagnosis Code(s):        --- Professional ---                           I85.00, Esophageal varices without bleeding                           K44.9, Diaphragmatic hernia without obstruction or  gangrene                           K76.6, Portal hypertension                           K31.89, Other diseases of stomach and duodenum CPT copyright 2016 American Medical Association. All rights reserved. The codes documented in this report are preliminary and upon coder review may  be revised to meet current compliance requirements. Jerene Bears, MD 12/15/2016 12:35:48 PM This report has been signed electronically. Number of Addenda: 0

## 2016-12-15 NOTE — Anesthesia Preprocedure Evaluation (Signed)
Anesthesia Evaluation  Patient identified by MRN, date of birth, ID band Patient awake    Reviewed: Allergy & Precautions, H&P , NPO status , Patient's Chart, lab work & pertinent test results  Airway Mallampati: II   Neck ROM: full    Dental  (+) Poor Dentition   Pulmonary shortness of breath and with exertion, asthma , Current Smoker,    Pulmonary exam normal breath sounds clear to auscultation       Cardiovascular negative cardio ROS Normal cardiovascular exam Rhythm:Regular Rate:Normal     Neuro/Psych PSYCHIATRIC DISORDERS negative neurological ROS     GI/Hepatic PUD, (+) Cirrhosis   Esophageal Varices and ascites  substance abuse  alcohol use, Hepatitis -, CHx/o Hepatocellular Ca S/P resection GI bleed   Endo/Other  negative endocrine ROS  Renal/GU negative Renal ROS   ED    Musculoskeletal  (+) Arthritis ,   Abdominal   Peds  Hematology  (+) anemia , Thrombocytopenia   Anesthesia Other Findings   Reproductive/Obstetrics                             Anesthesia Physical  Anesthesia Plan  ASA: III  Anesthesia Plan: MAC   Post-op Pain Management:    Induction: Intravenous  Airway Management Planned: Nasal Cannula and Natural Airway  Additional Equipment:   Intra-op Plan:   Post-operative Plan:   Informed Consent: I have reviewed the patients History and Physical, chart, labs and discussed the procedure including the risks, benefits and alternatives for the proposed anesthesia with the patient or authorized representative who has indicated his/her understanding and acceptance.   Dental advisory given  Plan Discussed with: CRNA, Anesthesiologist and Surgeon  Anesthesia Plan Comments:         Anesthesia Quick Evaluation

## 2016-12-15 NOTE — Discharge Instructions (Signed)

## 2016-12-15 NOTE — Transfer of Care (Signed)
Immediate Anesthesia Transfer of Care Note  Patient: Peter Nelson  Procedure(s) Performed: Procedure(s): ESOPHAGOGASTRODUODENOSCOPY (EGD) WITH PROPOFOL ([name]) ESOPHAGEAL BANDING ([name])  Patient Location: PACU  Anesthesia Type:MAC  Level of Consciousness:  sedated, patient cooperative and responds to stimulation  Airway & Oxygen Therapy:Patient Spontanous Breathing and Patient connected to face mask oxgen  Post-op Assessment:  Report given to PACU RN and Post -op Vital signs reviewed and stable  Post vital signs:  Reviewed and stable  Last Vitals:  Vitals:   12/15/16 1038  BP: (!) 160/76  Pulse: 62  Resp: 15  Temp: 14.2 C    Complications: No apparent anesthesia complications

## 2016-12-15 NOTE — Interval H&P Note (Signed)
History and Physical Interval Note: Patient with a history of cirrhosis with portal hypertension. Recent upper GI bleed due to esophageal varices. Status post band ligation on 10/26/2016. Here for banding protocol and perhaps additional band placement depending on findings at endoscopy He has been dealing with ascites and lower extremity edema. He remains on diuretics He has been on nonselective beta blocker as well. HIGHER THAN BASELINE RISK.The nature of the procedure, as well as the risks, benefits, and alternatives were carefully and thoroughly reviewed with the patient. Ample time for discussion and questions allowed. The patient understood, was satisfied, and agreed to proceed.     CBC Latest Ref Rng & Units 11/25/2016 Mar 10, 202018 11/12/2016  WBC 4.0 - 10.5 K/uL 5.2 4.1 6.1  Hemoglobin 13.0 - 17.0 g/dL 10.1(L) 11.0 Repeated and verified X2.(L) 10.1(L)  Hematocrit 39.0 - 52.0 % 30.1(L) 32.3(L) 30.0(L)  Platelets 150.0 - 400.0 K/uL 134.0(L) 157.0 165    Lab Results  Component Value Date   INR 1.5 (H) 11/25/2016   INR 1.4 (H) 0Mar 10, 202018   INR 1.30 (L) 11/12/2016   PROTIME 15.6 (H) 11/12/2016   PROTIME 15.6 (H) 07/13/2016   PROTIME 13.2 11/13/2015     12/15/2016 11:31 AM  Owens Loffler Ozdemir  has presented today for surgery, with the diagnosis of GI hemorrhage  The various methods of treatment have been discussed with the patient and family. After consideration of risks, benefits and other options for treatment, the patient has consented to  Procedure(s): ESOPHAGOGASTRODUODENOSCOPY (EGD) WITH PROPOFOL ([name]) ESOPHAGEAL BANDING ([name]) as a surgical intervention .  The patient's history has been reviewed, patient examined, no change in status, stable for surgery.  I have reviewed the patient's chart and labs.  Questions were answered to the patient's satisfaction.     PYRTLE, JAY M
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
## 2016-12-16 ENCOUNTER — Ambulatory Visit: Payer: Medicare HMO | Admitting: Gastroenterology

## 2016-12-29 ENCOUNTER — Encounter (HOSPITAL_COMMUNITY): Payer: Self-pay

## 2016-12-29 ENCOUNTER — Encounter (HOSPITAL_COMMUNITY)
Admission: EM | Disposition: A | Discharge status: Home / Self Care | Payer: Self-pay | Source: Home / Self Care | Attending: Internal Medicine

## 2016-12-29 ENCOUNTER — Inpatient Hospital Stay (HOSPITAL_COMMUNITY)
Admission: EM | Admit: 2016-12-29 | Discharge: 2017-01-01 | DRG: 432 | Disposition: A | Discharge status: Home / Self Care | Payer: Medicare HMO | Attending: Internal Medicine | Admitting: Internal Medicine

## 2016-12-29 DIAGNOSIS — I8511 Secondary esophageal varices with bleeding: Secondary | ICD-10-CM | POA: Diagnosis not present

## 2016-12-29 DIAGNOSIS — D696 Thrombocytopenia, unspecified: Secondary | ICD-10-CM | POA: Diagnosis present

## 2016-12-29 DIAGNOSIS — K2211 Ulcer of esophagus with bleeding: Secondary | ICD-10-CM | POA: Diagnosis present

## 2016-12-29 DIAGNOSIS — Z8505 Personal history of malignant neoplasm of liver: Secondary | ICD-10-CM | POA: Diagnosis not present

## 2016-12-29 DIAGNOSIS — K766 Portal hypertension: Secondary | ICD-10-CM | POA: Diagnosis not present

## 2016-12-29 DIAGNOSIS — C220 Liver cell carcinoma: Secondary | ICD-10-CM | POA: Diagnosis not present

## 2016-12-29 DIAGNOSIS — R1111 Vomiting without nausea: Secondary | ICD-10-CM | POA: Diagnosis not present

## 2016-12-29 DIAGNOSIS — D62 Acute posthemorrhagic anemia: Secondary | ICD-10-CM | POA: Diagnosis not present

## 2016-12-29 DIAGNOSIS — R109 Unspecified abdominal pain: Secondary | ICD-10-CM | POA: Diagnosis present

## 2016-12-29 DIAGNOSIS — F1020 Alcohol dependence, uncomplicated: Secondary | ICD-10-CM | POA: Diagnosis present

## 2016-12-29 DIAGNOSIS — K7210 Chronic hepatic failure without coma: Secondary | ICD-10-CM | POA: Diagnosis not present

## 2016-12-29 DIAGNOSIS — Z9114 Patient's other noncompliance with medication regimen: Secondary | ICD-10-CM

## 2016-12-29 DIAGNOSIS — I8510 Secondary esophageal varices without bleeding: Secondary | ICD-10-CM

## 2016-12-29 DIAGNOSIS — K7460 Unspecified cirrhosis of liver: Secondary | ICD-10-CM | POA: Diagnosis present

## 2016-12-29 DIAGNOSIS — K7030 Alcoholic cirrhosis of liver without ascites: Secondary | ICD-10-CM | POA: Diagnosis not present

## 2016-12-29 DIAGNOSIS — B1920 Unspecified viral hepatitis C without hepatic coma: Secondary | ICD-10-CM | POA: Diagnosis present

## 2016-12-29 DIAGNOSIS — Z79899 Other long term (current) drug therapy: Secondary | ICD-10-CM | POA: Diagnosis not present

## 2016-12-29 DIAGNOSIS — K7031 Alcoholic cirrhosis of liver with ascites: Principal | ICD-10-CM | POA: Diagnosis present

## 2016-12-29 DIAGNOSIS — I8501 Esophageal varices with bleeding: Secondary | ICD-10-CM | POA: Diagnosis not present

## 2016-12-29 DIAGNOSIS — F17210 Nicotine dependence, cigarettes, uncomplicated: Secondary | ICD-10-CM | POA: Diagnosis present

## 2016-12-29 DIAGNOSIS — K922 Gastrointestinal hemorrhage, unspecified: Secondary | ICD-10-CM | POA: Diagnosis present

## 2016-12-29 DIAGNOSIS — K2970 Gastritis, unspecified, without bleeding: Secondary | ICD-10-CM | POA: Diagnosis not present

## 2016-12-29 DIAGNOSIS — R188 Other ascites: Secondary | ICD-10-CM

## 2016-12-29 DIAGNOSIS — D500 Iron deficiency anemia secondary to blood loss (chronic): Secondary | ICD-10-CM | POA: Diagnosis not present

## 2016-12-29 HISTORY — PX: ESOPHAGOGASTRODUODENOSCOPY: SHX5428

## 2016-12-29 LAB — PREPARE RBC (CROSSMATCH)

## 2016-12-29 LAB — CBC
HEMATOCRIT: 24.8 % — AB (ref 39.0–52.0)
Hemoglobin: 8.2 g/dL — ABNORMAL LOW (ref 13.0–17.0)
MCH: 31.7 pg (ref 26.0–34.0)
MCHC: 33.1 g/dL (ref 30.0–36.0)
MCV: 95.8 fL (ref 78.0–100.0)
Platelets: 143 10*3/uL — ABNORMAL LOW (ref 150–400)
RBC: 2.59 MIL/uL — ABNORMAL LOW (ref 4.22–5.81)
RDW: 15.3 % (ref 11.5–15.5)
WBC: 6.2 10*3/uL (ref 4.0–10.5)

## 2016-12-29 LAB — DIFFERENTIAL
Basophils Absolute: 0 10*3/uL (ref 0.0–0.1)
Basophils Relative: 1 %
EOS PCT: 0 %
Eosinophils Absolute: 0 10*3/uL (ref 0.0–0.7)
LYMPHS PCT: 27 %
Lymphs Abs: 1.7 10*3/uL (ref 0.7–4.0)
Monocytes Absolute: 0.7 10*3/uL (ref 0.1–1.0)
Monocytes Relative: 11 %
NEUTROS ABS: 3.9 10*3/uL (ref 1.7–7.7)
NEUTROS PCT: 61 %

## 2016-12-29 LAB — COMPREHENSIVE METABOLIC PANEL
ALBUMIN: 2.3 g/dL — AB (ref 3.5–5.0)
ALK PHOS: 94 U/L (ref 38–126)
ALT: 27 U/L (ref 17–63)
AST: 60 U/L — AB (ref 15–41)
Anion gap: 5 (ref 5–15)
BUN: 26 mg/dL — AB (ref 6–20)
CO2: 26 mmol/L (ref 22–32)
Calcium: 8.3 mg/dL — ABNORMAL LOW (ref 8.9–10.3)
Chloride: 111 mmol/L (ref 101–111)
Creatinine, Ser: 1 mg/dL (ref 0.61–1.24)
GFR calc Af Amer: >60 mL/min (ref >60)
GFR calc non Af Amer: >60 mL/min (ref >60)
GLUCOSE: 104 mg/dL — AB (ref 65–99)
POTASSIUM: 3.5 mmol/L (ref 3.5–5.1)
SODIUM: 142 mmol/L (ref 135–145)
TOTAL PROTEIN: 7.1 g/dL (ref 6.5–8.1)
Total Bilirubin: 1.9 mg/dL — ABNORMAL HIGH (ref 0.3–1.2)

## 2016-12-29 LAB — I-STAT CHEM 8, ED
BUN: 26 mg/dL — AB (ref 6–20)
CHLORIDE: 109 mmol/L (ref 101–111)
Calcium, Ion: 1.12 mmol/L — ABNORMAL LOW (ref 1.15–1.40)
Creatinine, Ser: 0.9 mg/dL (ref 0.61–1.24)
Glucose, Bld: 101 mg/dL — ABNORMAL HIGH (ref 65–99)
HEMATOCRIT: 24 % — AB (ref 39.0–52.0)
Hemoglobin: 8.2 g/dL — ABNORMAL LOW (ref 13.0–17.0)
POTASSIUM: 3.7 mmol/L (ref 3.5–5.1)
SODIUM: 147 mmol/L — AB (ref 135–145)
TCO2: 26 mmol/L (ref 0–100)

## 2016-12-29 LAB — LACTIC ACID, PLASMA
LACTIC ACID, VENOUS: 1.6 mmol/L (ref 0.5–1.9)
Lactic Acid, Venous: 1.7 mmol/L (ref 0.5–1.9)

## 2016-12-29 LAB — APTT: aPTT: 36 seconds (ref 24–36)

## 2016-12-29 LAB — I-STAT CG4 LACTIC ACID, ED: Lactic Acid, Venous: 2.3 mmol/L (ref 0.5–1.9)

## 2016-12-29 LAB — PROTIME-INR
INR: 1.5
PROTHROMBIN TIME: 18.3 s — AB (ref 11.4–15.2)

## 2016-12-29 LAB — LIPASE, BLOOD: Lipase: 24 U/L (ref 11–51)

## 2016-12-29 SURGERY — EGD (ESOPHAGOGASTRODUODENOSCOPY)
Anesthesia: Moderate Sedation

## 2016-12-29 MED ORDER — METOCLOPRAMIDE HCL 5 MG/ML IJ SOLN
10.0000 mg | Freq: Once | INTRAMUSCULAR | Status: AC
  Administered 2016-12-29: 10 mg via INTRAVENOUS
  Filled 2016-12-29: qty 2

## 2016-12-29 MED ORDER — ONDANSETRON HCL 4 MG/2ML IJ SOLN
4.0000 mg | Freq: Once | INTRAMUSCULAR | Status: AC
  Administered 2016-12-29: 4 mg via INTRAVENOUS
  Filled 2016-12-29: qty 2

## 2016-12-29 MED ORDER — SODIUM CHLORIDE 0.9 % IV SOLN: INTRAVENOUS | Status: DC

## 2016-12-29 MED ORDER — ACETAMINOPHEN 650 MG RE SUPP: 650.0000 mg | Freq: Four times a day (QID) | RECTAL | Status: DC | PRN

## 2016-12-29 MED ORDER — OCTREOTIDE LOAD VIA INFUSION
50.0000 ug | Freq: Once | INTRAVENOUS | Status: AC
  Administered 2016-12-29: 50 ug via INTRAVENOUS
  Filled 2016-12-29: qty 25

## 2016-12-29 MED ORDER — SODIUM CHLORIDE 0.9 % IV SOLN: Freq: Once | INTRAVENOUS | Status: DC

## 2016-12-29 MED ORDER — FENTANYL CITRATE (PF) 100 MCG/2ML IJ SOLN
INTRAMUSCULAR | Status: AC
  Filled 2016-12-29: qty 4

## 2016-12-29 MED ORDER — DIPHENHYDRAMINE HCL 50 MG/ML IJ SOLN
INTRAMUSCULAR | Status: AC
  Filled 2016-12-29: qty 1

## 2016-12-29 MED ORDER — FOLIC ACID 1 MG PO TABS
1.0000 mg | ORAL_TABLET | Freq: Every day | ORAL | Status: DC
  Administered 2016-12-30 – 2017-01-01 (×3): 1 mg via ORAL
  Filled 2016-12-29 (×3): qty 1

## 2016-12-29 MED ORDER — LIDOCAINE HCL 2 % EX GEL
1.0000 "application " | Freq: Once | CUTANEOUS | Status: DC
  Filled 2016-12-29: qty 11

## 2016-12-29 MED ORDER — PANTOPRAZOLE SODIUM 40 MG IV SOLR: 40.0000 mg | Freq: Two times a day (BID) | INTRAVENOUS | Status: DC

## 2016-12-29 MED ORDER — ACETAMINOPHEN 325 MG PO TABS
650.0000 mg | ORAL_TABLET | Freq: Four times a day (QID) | ORAL | Status: DC | PRN
  Administered 2016-12-30: 650 mg via ORAL
  Filled 2016-12-29: qty 2

## 2016-12-29 MED ORDER — VITAMIN B-12 1000 MCG PO TABS
1000.0000 ug | ORAL_TABLET | Freq: Every day | ORAL | Status: DC
  Administered 2016-12-30 – 2017-01-01 (×3): 1000 ug via ORAL
  Filled 2016-12-29 (×3): qty 1

## 2016-12-29 MED ORDER — SODIUM CHLORIDE 0.9 % IV SOLN
INTRAVENOUS | Status: DC
  Administered 2016-12-30: 06:00:00 via INTRAVENOUS

## 2016-12-29 MED ORDER — FENTANYL CITRATE (PF) 100 MCG/2ML IJ SOLN
INTRAMUSCULAR | Status: DC | PRN
  Administered 2016-12-29 (×2): 25 ug via INTRAVENOUS

## 2016-12-29 MED ORDER — DEXTROSE 5 % IV SOLN
1.0000 g | Freq: Once | INTRAVENOUS | Status: AC
  Administered 2016-12-29: 1 g via INTRAVENOUS
  Filled 2016-12-29: qty 10

## 2016-12-29 MED ORDER — LACTATED RINGERS IV BOLUS (SEPSIS): 1000.0000 mL | Freq: Once | INTRAVENOUS | Status: DC

## 2016-12-29 MED ORDER — SODIUM CHLORIDE 0.9 % IV BOLUS (SEPSIS)
1000.0000 mL | Freq: Once | INTRAVENOUS | Status: AC
  Administered 2016-12-29: 1000 mL via INTRAVENOUS

## 2016-12-29 MED ORDER — DIPHENHYDRAMINE HCL 50 MG/ML IJ SOLN
INTRAMUSCULAR | Status: DC | PRN
  Administered 2016-12-29: 50 mg via INTRAVENOUS

## 2016-12-29 MED ORDER — MIDAZOLAM HCL 5 MG/ML IJ SOLN
INTRAMUSCULAR | Status: AC
  Filled 2016-12-29: qty 3

## 2016-12-29 MED ORDER — SODIUM CHLORIDE 0.9 % IV SOLN
80.0000 mg | Freq: Once | INTRAVENOUS | Status: AC
  Administered 2016-12-29: 80 mg via INTRAVENOUS
  Filled 2016-12-29: qty 80

## 2016-12-29 MED ORDER — NADOLOL 20 MG PO TABS: 20.0000 mg | ORAL_TABLET | Freq: Every day | ORAL | Status: DC

## 2016-12-29 MED ORDER — SODIUM CHLORIDE 0.9% FLUSH
3.0000 mL | Freq: Two times a day (BID) | INTRAVENOUS | Status: DC
  Administered 2016-12-31: 3 mL via INTRAVENOUS

## 2016-12-29 MED ORDER — ONDANSETRON HCL 4 MG PO TABS: 4.0000 mg | ORAL_TABLET | Freq: Four times a day (QID) | ORAL | Status: DC | PRN

## 2016-12-29 MED ORDER — FENTANYL CITRATE (PF) 100 MCG/2ML IJ SOLN: 25.0000 ug | INTRAMUSCULAR | Status: DC | PRN

## 2016-12-29 MED ORDER — DEXTROSE 5 % IV SOLN
1.0000 g | INTRAVENOUS | Status: DC
  Administered 2016-12-30 – 2016-12-31 (×2): 1 g via INTRAVENOUS
  Filled 2016-12-29 (×2): qty 10

## 2016-12-29 MED ORDER — SODIUM CHLORIDE 0.9 % IV SOLN
8.0000 mg/h | INTRAVENOUS | Status: DC
  Administered 2016-12-31: 8 mg/h via INTRAVENOUS
  Filled 2016-12-29 (×7): qty 80

## 2016-12-29 MED ORDER — BUTAMBEN-TETRACAINE-BENZOCAINE 2-2-14 % EX AERO
INHALATION_SPRAY | CUTANEOUS | Status: DC | PRN
  Administered 2016-12-29: 2 via TOPICAL

## 2016-12-29 MED ORDER — MIDAZOLAM HCL 10 MG/2ML IJ SOLN
INTRAMUSCULAR | Status: DC | PRN
  Administered 2016-12-29 (×2): 2 mg via INTRAVENOUS

## 2016-12-29 MED ORDER — ONDANSETRON HCL 4 MG/2ML IJ SOLN
INTRAMUSCULAR | Status: AC
  Administered 2016-12-29: 4 mg
  Filled 2016-12-29: qty 2

## 2016-12-29 MED ORDER — ONDANSETRON HCL 4 MG/2ML IJ SOLN: 4.0000 mg | Freq: Four times a day (QID) | INTRAMUSCULAR | Status: DC | PRN

## 2016-12-29 MED ORDER — SODIUM CHLORIDE 0.9 % IV SOLN: Freq: Once | INTRAVENOUS | Status: AC

## 2016-12-29 MED ORDER — OCTREOTIDE ACETATE 500 MCG/ML IJ SOLN
50.0000 ug/h | INTRAMUSCULAR | Status: DC
  Administered 2016-12-29 – 2016-12-31 (×3): 50 ug/h via INTRAVENOUS
  Filled 2016-12-29 (×6): qty 1

## 2016-12-29 NOTE — ED Notes (Signed)
Call patients wife to update if patient is moved.  Levada Dy Schaffert [phone]
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
## 2016-12-29 NOTE — ED Triage Notes (Signed)
Per EMS, pt from home.  Pt c/o abdominal pain with black stools.  Started last night.  N/V.  Vitals:  182/98, hr 100, resp 18, cbg 158

## 2016-12-29 NOTE — ED Notes (Signed)
attempted blood draw unsuccessful x2.
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
## 2016-12-29 NOTE — Op Note (Signed)
[facility] Patient Name: Peter Nelson Procedure Date: 12/29/2016 MRN: [id number] Attending MD: Gatha Mayer , MD Date of Birth: Jul 10, 1951 CSN: [id number] Age: 66 Admit Type: Inpatient Procedure:                Upper GI endoscopy Indications:              Hematochezia, Melena, Active gastrointestinal                            bleeding Providers:                Gatha Mayer, MD, Burtis Junes, RN, Cherylynn Ridges,                            Technician Referring MD:              Medicines:                Diphenhydramine 50 mg IV, Fentanyl 50 micrograms                            IV, Midazolam 4 mg IV Complications:            No immediate complications. Estimated Blood Loss:     Estimated blood loss: none. Procedure:                Pre-Anesthesia Assessment:                           - Prior to the procedure, a History and Physical                            was performed, and patient medications and                            allergies were reviewed. The patient's tolerance of                            previous anesthesia was also reviewed. The risks                            and benefits of the procedure and the sedation                            options and risks were discussed with the patient.                            All questions were answered, and informed consent                            was obtained. Prior Anticoagulants: The patient                            last took ibuprofen 1 day prior to the procedure.                            ASA  Grade Assessment: IV - A patient with severe                            systemic disease that is a constant threat to life.                            After reviewing the risks and benefits, the patient                            was deemed in satisfactory condition to undergo the                            procedure.                           After obtaining informed consent, the endoscope was                             passed under direct vision. Throughout the                            procedure, the patient's blood pressure, pulse, and                            oxygen saturations were monitored continuously. The                            EG-[id number] ([id number]) scope was introduced through the                            mouth, and advanced to the second part of duodenum.                            The upper GI endoscopy was accomplished without                            difficulty. The patient tolerated the procedure                            well. Scope In: Scope Out: Findings:      Spurting small (< 5 mm) varices were found at the gastroesophageal       junction,. Stigmata of prior treatment were evident. Scarring from prior       treatment was visible. Two bands were successfully placed with complete       eradication, resulting in deflation of varices. Bleeding had stopped at       the end of the procedure. Estimated blood loss: none.      Few superficial esophageal ulcers with bleeding at edge of one of the       ulcers and suepcted varix as described above. were found. The largest       lesion was 7 mm in largest dimension.      Red blood was found in the cardia, in the gastric fundus and in the       gastric body.  No gross lesions were noted in the duodenal bulb and in the second       portion of the duodenum. Impression:               - Bleeding small (< 5 mm) esophageal varices.                            Completely eradicated. Banded. Looks like variceal                            bleeding in vicinity of bading ulcer - I do not                            think it was the actual ulcer bleeding but not                            certain - if so it was on the ulcer/normal tissue                            border.                           - Bleeding at edge of one of the ulcers and                            suepcted varix as described above. esophageal                             ulcers.                           - Red blood in the cardia, in the gastric fundus                            and in the gastric body.                           - No gross lesions in the duodenal bulb and in the                            second portion of the duodenum.                           - No specimens collected. Moderate Sedation:      Moderate (conscious) sedation was administered by the endoscopy nurse       and supervised by the endoscopist. The following parameters were       monitored: oxygen saturation, heart rate, blood pressure, respiratory       rate, EKG, adequacy of pulmonary ventilation, and response to care.       Total physician intraservice time was 15 minutes. Recommendation:           - Admit the patient to ICU/stepdown for ongoing  care. OK to transfer to [facility] if necessary                           - NPO today.                           - Continue present medications. PPI, octreotide Procedure Code(s):        --- Professional ---                           [phone], Esophagogastroduodenoscopy, flexible,                            transoral; with band ligation of esophageal/gastric                            varices                           G0500, Moderate sedation services provided by the                            same physician or other qualified health care                            professional performing a gastrointestinal                            endoscopic service that sedation supports,                            requiring the presence of an independent trained                            observer to assist in the monitoring of the                            patient's level of consciousness and physiological                            status; initial 15 minutes of intra-service time;                            patient age 17 years or older (additional time may                            be reported with [phone], as  appropriate) Diagnosis Code(s):        --- Professional ---                           I85.01, Esophageal varices with bleeding                           K22.10, Ulcer of esophagus without bleeding  K92.2, Gastrointestinal hemorrhage, unspecified                           K92.1, Melena (includes Hematochezia) CPT copyright 2016 [employer] Association. All rights reserved. The codes documented in this report are preliminary and upon coder review may  be revised to meet current compliance requirements. Gatha Mayer, MD 12/29/2016 8:18:16 PM This report has been signed electronically. Number of Addenda: 0

## 2016-12-29 NOTE — ED Notes (Signed)
Multiple attempt to obtain IV access. 1 IV placed with [place] .

## 2016-12-30 ENCOUNTER — Ambulatory Visit: Payer: Medicare HMO | Admitting: Gastroenterology

## 2016-12-30 ENCOUNTER — Inpatient Hospital Stay (HOSPITAL_COMMUNITY): Payer: Medicare HMO

## 2016-12-30 ENCOUNTER — Encounter (HOSPITAL_COMMUNITY): Payer: Self-pay | Admitting: Internal Medicine

## 2016-12-30 DIAGNOSIS — R188 Other ascites: Secondary | ICD-10-CM

## 2016-12-30 LAB — COMPREHENSIVE METABOLIC PANEL
ALK PHOS: 68 U/L (ref 38–126)
ALT: 21 U/L (ref 17–63)
AST: 47 U/L — AB (ref 15–41)
Albumin: 1.8 g/dL — ABNORMAL LOW (ref 3.5–5.0)
Anion gap: 3 — ABNORMAL LOW (ref 5–15)
BILIRUBIN TOTAL: 2.2 mg/dL — AB (ref 0.3–1.2)
BUN: 32 mg/dL — AB (ref 6–20)
CALCIUM: 7.6 mg/dL — AB (ref 8.9–10.3)
CO2: 24 mmol/L (ref 22–32)
Chloride: 119 mmol/L — ABNORMAL HIGH (ref 101–111)
Creatinine, Ser: 1.11 mg/dL (ref 0.61–1.24)
GFR calc Af Amer: >60 mL/min (ref >60)
Glucose, Bld: 95 mg/dL (ref 65–99)
POTASSIUM: 4.1 mmol/L (ref 3.5–5.1)
Sodium: 146 mmol/L — ABNORMAL HIGH (ref 135–145)
TOTAL PROTEIN: 5.4 g/dL — AB (ref 6.5–8.1)

## 2016-12-30 LAB — URINALYSIS, ROUTINE W REFLEX MICROSCOPIC
Bilirubin Urine: NEGATIVE
GLUCOSE, UA: NEGATIVE mg/dL
Hgb urine dipstick: NEGATIVE
Ketones, ur: 5 mg/dL — AB
Leukocytes, UA: NEGATIVE
NITRITE: NEGATIVE
PH: 5 (ref 5.0–8.0)
Protein, ur: NEGATIVE mg/dL
SPECIFIC GRAVITY, URINE: 1.019 (ref 1.005–1.030)

## 2016-12-30 LAB — LACTATE DEHYDROGENASE, PLEURAL OR PERITONEAL FLUID: LD, Fluid: 50 U/L — ABNORMAL HIGH (ref 3–23)

## 2016-12-30 LAB — BODY FLUID CELL COUNT WITH DIFFERENTIAL
Lymphs, Fluid: 24 %
Monocyte-Macrophage-Serous Fluid: 75 % (ref 50–90)
NEUTROPHIL FLUID: 1 % (ref 0–25)
WBC FLUID: 64 uL (ref 0–1000)

## 2016-12-30 LAB — ALBUMIN, PLEURAL OR PERITONEAL FLUID: Albumin, Fluid: <1 g/dL

## 2016-12-30 LAB — GRAM STAIN

## 2016-12-30 LAB — PROTEIN, PLEURAL OR PERITONEAL FLUID

## 2016-12-30 LAB — GLUCOSE, CAPILLARY: Glucose-Capillary: 87 mg/dL (ref 65–99)

## 2016-12-30 LAB — MRSA PCR SCREENING: MRSA by PCR: NEGATIVE

## 2016-12-30 MED ORDER — SODIUM CHLORIDE 0.9 % IV BOLUS (SEPSIS)
250.0000 mL | Freq: Once | INTRAVENOUS | Status: AC
  Administered 2016-12-30: 250 mL via INTRAVENOUS

## 2016-12-30 NOTE — Progress Notes (Signed)
Patient temp before giving blood was 100.4. N.P. Informed and ordered to give tylenol and proceed to the next unit of blood. RN rechecked VS temp 101.4 HR 110-120's. N.P. Informed again and ordered 260m Bolus.  Recheck temp and give the next unit after 2513mbolus and wait for tylenol to work.

## 2016-12-30 NOTE — Progress Notes (Signed)
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 On the initial physical examination patient was found tachycardic at 123 bpm. Positive for ascites. Hb at 8.2. Patient admitted to the step down units, underwent upper endoscopy with esophageal varices ligation.    Assessment & Plan:   Principal Problem:   Upper GI bleeding Active Problems:   Alcoholic cirrhosis of liver (HCC)   Hepatocellular carcinoma (HCC)   Acute blood loss anemia   Hemorrhage of esophageal varices in alcoholic cirrhosis (HCC)   Esophageal varices in alcoholic cirrhosis (HCC)   Acute upper GI bleeding   1. Upper GI bleed, esophageal varices. Patient sp successful banding of varices, no signs of active bleeding, hb at 8,2. Will continue on octreotide and pantoprazole. Follow on cell count in am. Will follow on GI recommendations to advance diet.   2. Portal hypertension with ascites. Will schedule US guided paracentesis, follow fluid cell count, gram stain, culture, protein and albumin. Prophylactive cedtriaxone As needed fentanyl.   3. 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 Positive chronic live failure, noted elevated INR at 1,5. Normal ast and alt with elevated bilirubin.   4. Acute blood loss anemia due to upper GI bleed. Patient had 2 units prbc, positive fever during blood transfusion, treated with acetaminophen.    5. History of hepatocellular carcinoma. Will need follow up as outpatient.    DVT prophylaxis: scd  Code Status: full  Family Communication: I spoke with patient's family at the bedside and all questions were addressed  Disposition Plan: home   Consultants:   GI    Procedures:   Upper endoscopy 03/14.   Antimicrobials:     Ceftriaxone    Subjective: Patient with no chest pain or dyspnea, positive abdominal distention, no abdominal tenderness. Had fever during blood transfusion. No nausea or vomiting.   Objective: Vitals:   12/30/16 0615 12/30/16 0700 12/30/16 0742 12/30/16 0800  BP: 121/64 123/61 108/65   Pulse:      Resp: 13 15 13    Temp: (!) 100.4 F (38 C)   99 F (37.2 C)  TempSrc: Oral   Oral  SpO2: 99% 99% 98%   Weight:      Height:        Intake/Output Summary (Last 24 hours) at 12/30/16 0818 Last data filed at 12/30/16 0626  Gross per 24 hour  Intake          1890.42 ml  Output              275 ml  Net          1615.42 ml   Filed Weights   12/29/16 2255 12/30/16 0418  Weight: 63.2 kg (139 lb 5.3 oz) 66.7 kg (147 lb 0.8 oz)    Examination:  General exam: deconditioned E ENT: mild pallor, no icterus, oral mucosa moist.  Respiratory system: Clear to auscultation. Respiratory effort normal. No wheezing, rales or rhonchi.  Cardiovascular system: S1 & S2 heard, RRR. No JVD, murmurs, rubs, gallops or clicks. Trace pedal edema. Gastrointestinal system: Abdomen is distended but soft and nontender. No organomegaly or masses felt. Normal bowel sounds heard. Positive ascites, umbilical hernia.   Central nervous system: Alert and oriented. No focal neurological deficits. Extremities: Symmetric  5 x 5 power. Skin: No rashes, lesions or ulcers. No telangiectasias.     Data Reviewed: I have personally reviewed following labs and imaging studies  CBC:  Recent Labs Lab 12/29/16 1700 12/29/16 1733 12/29/16 1802  WBC 6.2  --   --   NEUTROABS  --   --  3.9  HGB 8.2* 8.2*  --   HCT 24.8* 24.0*  --   MCV 95.8  --   --   PLT 143*  --   --    Basic Metabolic Panel:  Recent Labs Lab 12/29/16 1700 12/29/16 1733  NA 142 147*  K 3.5 3.7  CL 111 109  CO2 26  --   GLUCOSE 104* 101*  BUN 26* 26*  CREATININE 1.00 0.90  CALCIUM 8.3*  --    GFR: Estimated Creatinine Clearance: 73.8  mL/min (by C-G formula based on SCr of 0.9 mg/dL). Liver Function Tests:  Recent Labs Lab 12/29/16 1700  AST 60*  ALT 27  ALKPHOS 94  BILITOT 1.9*  PROT 7.1  ALBUMIN 2.3*    Recent Labs Lab 12/29/16 1700  LIPASE 24   No results for input(s): AMMONIA in the last 168 hours. Coagulation Profile:  Recent Labs Lab 12/29/16 1802  INR 1.50   Cardiac Enzymes: No results for input(s): CKTOTAL, CKMB, CKMBINDEX, TROPONINI in the last 168 hours. BNP (last 3 results) No results for input(s): PROBNP in the last 8760 hours. HbA1C: No results for input(s): HGBA1C in the last 72 hours. CBG: No results for input(s): GLUCAP in the last 168 hours. Lipid Profile: No results for input(s): CHOL, HDL, LDLCALC, TRIG, CHOLHDL, LDLDIRECT in the last 72 hours. Thyroid Function Tests: No results for input(s): TSH, T4TOTAL, FREET4, T3FREE, THYROIDAB in the last 72 hours. Anemia Panel: No results for input(s): VITAMINB12, FOLATE, FERRITIN, TIBC, IRON, RETICCTPCT in the last 72 hours. Sepsis Labs:  Recent Labs Lab 12/29/16 1734 12/29/16 2031 12/29/16 2257  LATICACIDVEN 2.30* 1.7 1.6    Recent Results (from the past 240 hour(s))  MRSA PCR Screening     Status: None   Collection Time: 12/29/16 10:52 PM  Result Value Ref Range Status   MRSA by PCR NEGATIVE NEGATIVE Final    Comment:        The GeneXpert MRSA Assay (FDA approved for NASAL specimens only), is one component of a comprehensive MRSA colonization surveillance program. It is not intended to diagnose MRSA infection nor to guide or monitor treatment for MRSA infections.          Radiology Studies: No results found.      Scheduled Meds: . cefTRIAXone (ROCEPHIN)  IV  1 g Intravenous Q24H  . folic acid  1 mg Oral Daily  . lactated ringers  1,000 mL Intravenous Once  . lidocaine  1 application Other Once  . [START ON 01/02/2017] pantoprazole  40 mg Intravenous Q12H  . sodium chloride flush  3 mL Intravenous Q12H    . vitamin B-12  1,000 mcg Oral Daily   Continuous Infusions: . octreotide  (SANDOSTATIN)    IV infusion 50 mcg/hr (12/30/16 0418)  . pantoprozole (PROTONIX) infusion 8 mg/hr (12/30/16 0620)     LOS: 1 day        Joell Buerger Annett Gula, MD [facility] Pager [phone]  If 7PM-7AM, please contact night-coverage [email or web address] Password [facility] 12/30/2016, 8:18 AM

## 2016-12-30 NOTE — Progress Notes (Signed)
Attempted report x1. 

## 2016-12-30 NOTE — Procedures (Signed)
PROCEDURE SUMMARY:  Successful US guided paracentesis from right lateral abdomen.  Yielded 4.2L of clear, yellow fluid.  No immediate complications.  Pt tolerated well.   Specimen was sent for labs.  Docia Barrier PA-C 12/30/2016 11:12 AM

## 2016-12-30 NOTE — Progress Notes (Signed)
Progress Note   Subjective  Chief Complaint:upper GI bleed, melena  Patient doing well this morning after EGD yesterday with banding of bleeding varices. Patient has not had a bowel movement this morning. He denies abdominal pain or further hematemesis. He is "hungry".   Objective   Vital signs in last 24 hours: Temp:  [97.7 F (36.5 C)-101.4 F (38.6 C)] 99 F (37.2 C) (03/15 0800) Pulse Rate:  [103-129] 104 (03/14 2230) Resp:  [12-28] 13 (03/15 0742) BP: (97-159)/(52-90) 108/65 (03/15 0742) SpO2:  [93 %-100 %] 98 % (03/15 0742) Weight:  [139 lb 5.3 oz (63.2 kg)-147 lb 0.8 oz (66.7 kg)] 147 lb 0.8 oz (66.7 kg) (03/15 0418) Last BM Date: 12/29/16 General: African American male in NAD Heart:  Regular rate and rhythm; no murmurs Lungs: Respirations even and unlabored, lungs CTA bilaterally Abdomen:  Soft, nontender and nondistended. Normal bowel sounds. Extremities:  Without edema. Neurologic:  Alert and oriented,  grossly normal neurologically. Psych:  Cooperative. Normal mood and affect.  Intake/Output from previous day: 03/14 0701 - 03/15 0700 In: 1890.4 [I.V.:1191.7; Blood:698.8] Out: 275 [Urine:275]  Lab Results:  Recent Labs  12/29/16 1700 12/29/16 1733  WBC 6.2  --   HGB 8.2* 8.2*  HCT 24.8* 24.0*  PLT 143*  --    BMET  Recent Labs  12/29/16 1700 12/29/16 1733 12/30/16 0824  NA 142 147* 146*  K 3.5 3.7 4.1  CL 111 109 119*  CO2 26  --  24  GLUCOSE 104* 101* 95  BUN 26* 26* 32*  CREATININE 1.00 0.90 1.11  CALCIUM 8.3*  --  7.6*   LFT  Recent Labs  12/30/16 0824  PROT 5.4*  ALBUMIN 1.8*  AST 47*  ALT 21  ALKPHOS 68  BILITOT 2.2*   PT/INR  Recent Labs  12/29/16 1802  LABPROT 18.3*  INR 1.50    Studies/Results: EGD 12/29/16-Dr. Carlean Purl Findings:      Spurting small (< 5 mm) varices were found at the gastroesophageal       junction,. Stigmata of prior treatment were evident. Scarring from prior       treatment was visible. Two  bands were successfully placed with complete       eradication, resulting in deflation of varices. Bleeding had stopped at       the end of the procedure. Estimated blood loss: none.      Few superficial esophageal ulcers with bleeding at edge of one of the       ulcers and suepcted varix as described above. were found. The largest       lesion was 7 mm in largest dimension.      Red blood was found in the cardia, in the gastric fundus and in the       gastric body.      No gross lesions were noted in the duodenal bulb and in the second       portion of the duodenum. Impression:               - Bleeding small (< 5 mm) esophageal varices.                            Completely eradicated. Banded. Looks like variceal                            bleeding in vicinity of bading  ulcer - I do not                            think it was the actual ulcer bleeding but not                            certain - if so it was on the ulcer/normal tissue                            border.                           - Bleeding at edge of one of the ulcers and                            suepcted varix as described above. esophageal                            ulcers.                           - Red blood in the cardia, in the gastric fundus                            and in the gastric body.                           - No gross lesions in the duodenal bulb and in the                            second portion of the duodenum.                           - No specimens collected. Moderate Sedation:      Moderate (conscious) sedation was administered by the endoscopy nurse       and supervised by the endoscopist. The following parameters were       monitored: oxygen saturation, heart rate, blood pressure, respiratory       rate, EKG, adequacy of pulmonary ventilation, and response to care.       Total physician intraservice time was 15 minutes. Recommendation:           - Admit the patient to ICU/stepdown for  ongoing                            care. OK to transfer to [facility] if necessary                           - NPO today.                           - Continue present medications. PPI, octreotide   Assessment / Plan:   Assessment: 1.Hematemesis plus melena: No further since time of admission yesterday and banding of bleeding esophageal varix 2. Acute blood loss anemia: Related to above, stable overnight 3. History of esophageal varices and  portal gastropathy with history of bleeding and variceal banding 2 weeks ago 4. 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 Exam consistent with ascites  Plan: 1. Continue PPI and octreotide 2. Patient to remain nothing by mouth 3. Please await any further recommendations from Dr. Carlean Purl  Thank you for your kind consultation, we will continue to follow   LOS: 1 day   Peter Nelson  12/30/2016, 10:08 AM  Pager # [phone]    [facility] Attending   I have taken an interval history, reviewed the chart and examined the patient. I agree with the Advanced Practitioner's note, impression and recommendations.    Definitely better after EGD and paracentesis Diet started Will f/u tomorrow - will probably be able to go tomorrow vs next day depending upon course  Gatha Mayer, MD, [facility] [phone] (pager) [phone] after 5 PM, weekends and holidays  12/30/2016 1:49 PM

## 2016-12-30 NOTE — Progress Notes (Signed)
Patient had no Urine output since he was admitted in the floor. Bladder Scann showed 150-145m, N.P. Informed. No new orders. Will continue to monitor.

## 2016-12-31 LAB — TYPE AND SCREEN
ABO/RH(D): O NEG
Antibody Screen: NEGATIVE
Unit division: 0
Unit division: 0

## 2016-12-31 LAB — CBC WITH DIFFERENTIAL/PLATELET
BASOS ABS: 0 10*3/uL (ref 0.0–0.1)
Basophils Relative: 1 %
Eosinophils Absolute: 0.6 10*3/uL (ref 0.0–0.7)
Eosinophils Relative: 8 %
HEMATOCRIT: 23.8 % — AB (ref 39.0–52.0)
Hemoglobin: 8 g/dL — ABNORMAL LOW (ref 13.0–17.0)
LYMPHS ABS: 2.3 10*3/uL (ref 0.7–4.0)
LYMPHS PCT: 32 %
MCH: 31 pg (ref 26.0–34.0)
MCHC: 33.6 g/dL (ref 30.0–36.0)
MCV: 92.2 fL (ref 78.0–100.0)
MONO ABS: 0.7 10*3/uL (ref 0.1–1.0)
Monocytes Relative: 10 %
NEUTROS ABS: 3.6 10*3/uL (ref 1.7–7.7)
Neutrophils Relative %: 50 %
Platelets: 94 10*3/uL — ABNORMAL LOW (ref 150–400)
RBC: 2.58 MIL/uL — AB (ref 4.22–5.81)
RDW: 17.8 % — ABNORMAL HIGH (ref 11.5–15.5)
WBC: 7.2 10*3/uL (ref 4.0–10.5)

## 2016-12-31 LAB — BPAM RBC
Blood Product Expiration Date: 201803312359
Blood Product Expiration Date: 201804042359
ISSUE DATE / TIME: 201803142305
ISSUE DATE / TIME: 201803150350
UNIT TYPE AND RH: 9500
Unit Type and Rh: 9500

## 2016-12-31 LAB — BASIC METABOLIC PANEL
Anion gap: 3 — ABNORMAL LOW (ref 5–15)
BUN: 27 mg/dL — AB (ref 6–20)
CO2: 23 mmol/L (ref 22–32)
Calcium: 7.3 mg/dL — ABNORMAL LOW (ref 8.9–10.3)
Chloride: 116 mmol/L — ABNORMAL HIGH (ref 101–111)
Creatinine, Ser: 1.17 mg/dL (ref 0.61–1.24)
GFR calc Af Amer: >60 mL/min (ref >60)
GFR calc non Af Amer: >60 mL/min (ref >60)
GLUCOSE: 95 mg/dL (ref 65–99)
POTASSIUM: 3.5 mmol/L (ref 3.5–5.1)
Sodium: 142 mmol/L (ref 135–145)

## 2016-12-31 LAB — GLUCOSE, CAPILLARY: Glucose-Capillary: 100 mg/dL — ABNORMAL HIGH (ref 65–99)

## 2016-12-31 MED ORDER — SPIRONOLACTONE 25 MG PO TABS
100.0000 mg | ORAL_TABLET | Freq: Every day | ORAL | Status: DC
  Administered 2017-01-01: 100 mg via ORAL
  Filled 2016-12-31: qty 4

## 2016-12-31 MED ORDER — NADOLOL 20 MG PO TABS
20.0000 mg | ORAL_TABLET | Freq: Every day | ORAL | Status: DC
  Administered 2017-01-01: 20 mg via ORAL
  Filled 2016-12-31: qty 1

## 2016-12-31 MED ORDER — PANTOPRAZOLE SODIUM 40 MG IV SOLR
40.0000 mg | Freq: Two times a day (BID) | INTRAVENOUS | Status: DC
  Administered 2016-12-31 – 2017-01-01 (×2): 40 mg via INTRAVENOUS
  Filled 2016-12-31 (×2): qty 40

## 2016-12-31 MED ORDER — FUROSEMIDE 40 MG PO TABS
80.0000 mg | ORAL_TABLET | Freq: Every day | ORAL | Status: DC
  Administered 2017-01-01: 80 mg via ORAL
  Filled 2016-12-31: qty 2

## 2016-12-31 NOTE — Progress Notes (Signed)
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 On the initial physical examination patient was found tachycardic at 123 bpm. Positive for ascites. Hb at 8.2. Patient admitted to the step down units, underwent upper endoscopy with esophageal varices ligation. e:     Assessment & Plan:   Principal Problem:   Upper GI bleeding Active Problems:   Alcoholic cirrhosis of liver (HCC)   Hepatocellular carcinoma (HCC)   Acute blood loss anemia   Hemorrhage of esophageal varices in alcoholic cirrhosis (HCC)   Esophageal varices in alcoholic cirrhosis (HCC)   Acute upper GI bleeding    1. Upper GI bleed, esophageal varices. Sp banding. No further bleeding, hb and hct stable. Diet has been advanced, will continue octreotide and will change protonix to bid. Follow with GI recommendations.   2. Portal hypertension with ascites. Peritoneal fluid with 64 wbc, no signs of infection, will continue ceftriaxone for prophylaxis for now, due to recent GI bleed. 4.2 liters were removed.   3. 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 Positive coagulopathy but no signs of encephalopathy. Will continue supportive care,  Hep C viral load not detectable on 02/18.   4. Acute blood loss anemia due to upper GI bleed. SP 2 units prbc. No further signs of bleeding, will follow on GI recommendation before discharge. Check inr in am, platelets down to 94.   5. History of hepatocellular carcinoma. Supportive care for now.  6. Thrombocytopenia. Close follow on platelets, likely related to chronic liver disease.     DVT prophylaxis: scd  Code Status: full  Family Communication: I spoke with patient's family at the bedside and all questions were addressed    Disposition Plan: home   Consultants:   GI    Procedures:   Upper endoscopy 03/14.   Antimicrobials:   Ceftriaxone    Subjective: Patient feeling better, no melena or hematochezia, no nausea or vomiting, no chest pain or dyspnea.   Objective: Vitals:   12/30/16 1145 12/30/16 2218 12/31/16 0551 12/31/16 0758  BP: 125/67 140/80 118/72 122/68  Pulse: 83 91 84 80  Resp: 16 16 16 16   Temp: 99.2 F (37.3 C) 99.2 F (37.3 C) 99.4 F (37.4 C) 98.9 F (37.2 C)  TempSrc: Oral Oral Oral Oral  SpO2: 100% 98% 98% 98%  Weight:   64.2 kg (141 lb 8.6 oz)   Height:        Intake/Output Summary (Last 24 hours) at 12/31/16 1545 Last data filed at 12/31/16 1500  Gross per 24 hour  Intake          2084.16 ml  Output              700 ml  Net          1384.16 ml   Filed Weights   12/29/16 2255 12/30/16 0418 12/31/16 0551  Weight: 63.2 kg (139 lb 5.3 oz) 66.7 kg (147 lb 0.8 oz) 64.2 kg (141 lb 8.6 oz)    Examination:  General exam: not in pain or dyspnea E ENT: no pallor or icterus, oral mucosa moist Respiratory system: Clear to auscultation. Respiratory effort normal. Cardiovascular system: S1 & S2 heard, RRR. No JVD, murmurs, rubs, gallops or clicks. No pedal edema. Gastrointestinal system: Abdomen  is nondistended, soft and nontender. No organomegaly or masses felt. Normal bowel sounds heard. Central nervous system: Alert and oriented. No focal neurological deficits. Extremities: Symmetric 5 x 5 power. Skin: No rashes, lesions or ulcers     Data Reviewed: I have personally reviewed following labs and imaging studies  CBC:  Recent Labs Lab 12/29/16 1700 12/29/16 1733 12/29/16 1802 12/31/16 0443  WBC 6.2  --   --  7.2  NEUTROABS  --   --  3.9 3.6  HGB 8.2* 8.2*  --  8.0*  HCT 24.8* 24.0*  --  23.8*  MCV 95.8  --   --  92.2  PLT 143*  --   --  94*   Basic Metabolic Panel:  Recent Labs Lab 12/29/16 1700 12/29/16 1733 12/30/16 0824 12/31/16 0443  NA  142 147* 146* 142  K 3.5 3.7 4.1 3.5  CL 111 109 119* 116*  CO2 26  --  24 23  GLUCOSE 104* 101* 95 95  BUN 26* 26* 32* 27*  CREATININE 1.00 0.90 1.11 1.17  CALCIUM 8.3*  --  7.6* 7.3*   GFR: Estimated Creatinine Clearance: 56.8 mL/min (by C-G formula based on SCr of 1.17 mg/dL). Liver Function Tests:  Recent Labs Lab 12/29/16 1700 12/30/16 0824  AST 60* 47*  ALT 27 21  ALKPHOS 94 68  BILITOT 1.9* 2.2*  PROT 7.1 5.4*  ALBUMIN 2.3* 1.8*    Recent Labs Lab 12/29/16 1700  LIPASE 24   No results for input(s): AMMONIA in the last 168 hours. Coagulation Profile:  Recent Labs Lab 12/29/16 1802  INR 1.50   Cardiac Enzymes: No results for input(s): CKTOTAL, CKMB, CKMBINDEX, TROPONINI in the last 168 hours. BNP (last 3 results) No results for input(s): PROBNP in the last 8760 hours. HbA1C: No results for input(s): HGBA1C in the last 72 hours. CBG:  Recent Labs Lab 12/30/16 0748 12/31/16 0810  GLUCAP 87 100*   Lipid Profile: No results for input(s): CHOL, HDL, LDLCALC, TRIG, CHOLHDL, LDLDIRECT in the last 72 hours. Thyroid Function Tests: No results for input(s): TSH, T4TOTAL, FREET4, T3FREE, THYROIDAB in the last 72 hours. Anemia Panel: No results for input(s): VITAMINB12, FOLATE, FERRITIN, TIBC, IRON, RETICCTPCT in the last 72 hours. Sepsis Labs:  Recent Labs Lab 12/29/16 1734 12/29/16 2031 12/29/16 2257  LATICACIDVEN 2.30* 1.7 1.6    Recent Results (from the past 240 hour(s))  Blood culture (routine x 2)     Status: None (Preliminary result)   Collection Time: 12/29/16  6:01 PM  Result Value Ref Range Status   Specimen Description BLOOD LEFT HAND  Final   Special Requests BOTTLES DRAWN AEROBIC AND ANAEROBIC 5CC  Final   Culture   Final    NO GROWTH 2 DAYS Performed at [facility], [address] [place], [place] [address]    Report Status PENDING  Incomplete  Blood culture (routine x 2)     Status: None (Preliminary result)   Collection  Time: 12/29/16  6:19 PM  Result Value Ref Range Status   Specimen Description BLOOD RIGHT ARM  Final   Special Requests BOTTLES DRAWN AEROBIC AND ANAEROBIC 5CC  Final   Culture   Final    NO GROWTH 2 DAYS Performed at [facility], [address], [place], [place] [address]    Report Status PENDING  Incomplete  MRSA PCR Screening     Status: None   Collection Time: 12/29/16 10:52 PM  Result Value Ref  Range Status   MRSA by PCR NEGATIVE NEGATIVE Final    Comment:        The GeneXpert MRSA Assay (FDA approved for NASAL specimens only), is one component of a comprehensive MRSA colonization surveillance program. It is not intended to diagnose MRSA infection nor to guide or monitor treatment for MRSA infections.   Culture, body fluid-bottle     Status: None (Preliminary result)   Collection Time: 12/30/16 10:17 AM  Result Value Ref Range Status   Specimen Description PERITONEAL  Final   Special Requests NONE  Final   Culture   Final    NO GROWTH 1 DAY Performed at [facility], [address], [place], [place] [address]    Report Status PENDING  Incomplete  Gram stain     Status: None   Collection Time: 12/30/16 10:17 AM  Result Value Ref Range Status   Specimen Description PERITONEAL  Final   Special Requests NONE  Final   Gram Stain   Final    RARE WBC PRESENT, PREDOMINANTLY PMN NO ORGANISMS SEEN Performed at [facility], [address], [place], [place] [address]    Report Status 12/30/2016 FINAL  Final         Radiology Studies: US Paracentesis  Result Date: 12/30/2016 INDICATION: Patient with history of cirrhosis presents with ascites. Request is made for diagnostic and therapeutic paracentesis. EXAM: ULTRASOUND GUIDED DIAGNOSTIC AND THERAPEUTIC PARACENTESIS MEDICATIONS: 10 mL 1% lidocaine COMPLICATIONS: None immediate. PROCEDURE: Informed written consent was obtained from the patient after a discussion of the risks, benefits and alternatives  to treatment. A timeout was performed prior to the initiation of the procedure. Initial ultrasound scanning demonstrates a large amount of ascites within the right lateral abdomen. The right lateral abdomen was prepped and draped in the usual sterile fashion. 1% lidocaine was used for local anesthesia. Following this, a 19 gauge, 7-cm, Yueh catheter was introduced. An ultrasound image was saved for documentation purposes. The paracentesis was performed. The catheter was removed and a dressing was applied. The patient tolerated the procedure well without immediate post procedural complication. FINDINGS: A total of approximately 4.2 liters of clear, yellow fluid was removed. Samples were sent to the laboratory as requested by the clinical team. IMPRESSION: Successful ultrasound-guided diagnostic and therapeutic paracentesis yielding 4.2 liters of peritoneal fluid. Read by:  Loyce Dys PA-C Electronically Signed   By: Simonne Come M.D.   On: 12/30/2016 11:18        Scheduled Meds: . cefTRIAXone (ROCEPHIN)  IV  1 g Intravenous Q24H  . folic acid  1 mg Oral Daily  . lidocaine  1 application Other Once  . [START ON 01/02/2017] pantoprazole  40 mg Intravenous Q12H  . sodium chloride flush  3 mL Intravenous Q12H  . vitamin B-12  1,000 mcg Oral Daily   Continuous Infusions: . pantoprozole (PROTONIX) infusion 8 mg/hr (12/31/16 0205)     LOS: 2 days    Sirus Labrie Annett Gula, MD [facility] Pager [phone]  If 7PM-7AM, please contact night-coverage [email or web address] Password [facility] 12/31/2016, 3:45 PM

## 2016-12-31 NOTE — Progress Notes (Signed)
    Progress Note   Subjective  Chief Complaint:Upper GI bleed, melena  Patient doing well this morning, had a full breakfast. He denies any further bleeding or abdominal pain. He is ready to "get out of here".   Objective   Vital signs in last 24 hours: Temp:  [98.9 F (37.2 C)-99.4 F (37.4 C)] 98.9 F (37.2 C) (03/16 0758) Pulse Rate:  [80-91] 80 (03/16 0758) Resp:  [16] 16 (03/16 0758) BP: (118-140)/(67-80) 122/68 (03/16 0758) SpO2:  [98 %-100 %] 98 % (03/16 0758) Weight:  [141 lb 8.6 oz (64.2 kg)] 141 lb 8.6 oz (64.2 kg) (03/16 0551) Last BM Date: 12/30/16 General: African American male in NAD Heart:  Regular rate and rhythm; no murmurs Lungs: Respirations even and unlabored, lungs CTA bilaterally Abdomen:  Soft, nontender and mil distension Normal bowel sounds. Extremities:  Without edema. Neurologic:  Alert and oriented,  grossly normal neurologically. Psych:  Cooperative. Normal mood and affect.  Intake/Output from previous day: 03/15 0701 - 03/16 0700 In: 1481.7 [P.O.:200; I.V.:1231.7; IV Piggyback:50] Out: 700 [Urine:700]  Lab Results:  Recent Labs  12/29/16 1700 12/29/16 1733 12/31/16 0443  WBC 6.2  --  7.2  HGB 8.2* 8.2* 8.0*  HCT 24.8* 24.0* 23.8*  PLT 143*  --  94*   BMET  Recent Labs  12/29/16 1700 12/29/16 1733 12/30/16 0824 12/31/16 0443  NA 142 147* 146* 142  K 3.5 3.7 4.1 3.5  CL 111 109 119* 116*  CO2 26  --  24 23  GLUCOSE 104* 101* 95 95  BUN 26* 26* 32* 27*  CREATININE 1.00 0.90 1.11 1.17  CALCIUM 8.3*  --  7.6* 7.3*      Assessment / Plan:   Assessment: 1. Hematemesis plus melena: no further since EGD 12/29/16 and banding of varices 2. Acute blood loss anemia:hgb stable 3. HCV/alcohol cirrhosis and history of hepatocellular carcinoma 4. Ascites:4 L drawn off during paracentesis yesterday, patient better today  Plan: 1. Continue PPI, octreotide discontinued today 2. Patient may remain on his current diet 3. Please await  any further recommendations from Dr. Carlean Purl, patient could possibly be discharged later today  Thank you for your kind consultation   LOS: 2 days   Lavone Nian [facility]  12/31/2016, 10:30 AM  Pager # [phone]    [facility] Attending   I have taken an interval history, reviewed the chart and examined the patient. I agree with the Advanced Practitioner's note, impression and recommendations.   Improved overall.  I have:  Restarted diuretics for AM at increased doses  Restarted nadolol for AM Think he could go home tomorrow   Would also use pantoprazole 40 mg daily  NO NSAIDS  Does not need Abx at DC  I have ordered labs for him to be done Tues March 20 at our office and will get results to Dr. [name] and arrange f/u also  Dr. Benson Norway is covering [place] so call him if ? I am not having him see patient unless you call  Gatha Mayer, MD, [facility] [phone] (pager) [phone] after 5 PM, weekends and holidays  12/31/2016 6:16 PM

## 2017-01-01 LAB — BASIC METABOLIC PANEL
Anion gap: 6 (ref 5–15)
BUN: 20 mg/dL (ref 6–20)
CALCIUM: 7.4 mg/dL — AB (ref 8.9–10.3)
CO2: 21 mmol/L — ABNORMAL LOW (ref 22–32)
Chloride: 112 mmol/L — ABNORMAL HIGH (ref 101–111)
Creatinine, Ser: 1.2 mg/dL (ref 0.61–1.24)
GFR calc Af Amer: >60 mL/min (ref >60)
GLUCOSE: 105 mg/dL — AB (ref 65–99)
POTASSIUM: 3.3 mmol/L — AB (ref 3.5–5.1)
Sodium: 139 mmol/L (ref 135–145)

## 2017-01-01 LAB — CBC WITH DIFFERENTIAL/PLATELET
Basophils Absolute: 0.1 10*3/uL (ref 0.0–0.1)
Basophils Relative: 1 %
EOS PCT: 10 %
Eosinophils Absolute: 0.8 10*3/uL — ABNORMAL HIGH (ref 0.0–0.7)
HCT: 25.2 % — ABNORMAL LOW (ref 39.0–52.0)
Hemoglobin: 8.5 g/dL — ABNORMAL LOW (ref 13.0–17.0)
LYMPHS ABS: 2.5 10*3/uL (ref 0.7–4.0)
LYMPHS PCT: 31 %
MCH: 31.8 pg (ref 26.0–34.0)
MCHC: 33.7 g/dL (ref 30.0–36.0)
MCV: 94.4 fL (ref 78.0–100.0)
MONO ABS: 0.7 10*3/uL (ref 0.1–1.0)
MONOS PCT: 8 %
Neutro Abs: 4 10*3/uL (ref 1.7–7.7)
Neutrophils Relative %: 50 %
PLATELETS: 99 10*3/uL — AB (ref 150–400)
RBC: 2.67 MIL/uL — ABNORMAL LOW (ref 4.22–5.81)
RDW: 17.3 % — AB (ref 11.5–15.5)
WBC: 8 10*3/uL (ref 4.0–10.5)

## 2017-01-01 MED ORDER — NADOLOL 20 MG PO TABS: 20.0000 mg | ORAL_TABLET | Freq: Every day | ORAL | 0 refills | Status: DC

## 2017-01-01 MED ORDER — SPIRONOLACTONE 50 MG PO TABS: 50.0000 mg | ORAL_TABLET | Freq: Every day | ORAL | 0 refills | Status: DC

## 2017-01-01 MED ORDER — FOLIC ACID 1 MG PO TABS: 1.0000 mg | ORAL_TABLET | Freq: Every day | ORAL | 0 refills | Status: DC

## 2017-01-01 MED ORDER — PANTOPRAZOLE SODIUM 40 MG PO TBEC: 40.0000 mg | DELAYED_RELEASE_TABLET | Freq: Two times a day (BID) | ORAL | 0 refills | Status: DC

## 2017-01-01 MED ORDER — FUROSEMIDE 40 MG PO TABS: 40.0000 mg | ORAL_TABLET | Freq: Every day | ORAL | 0 refills | Status: DC

## 2017-01-01 NOTE — Discharge Summary (Signed)
Physician Discharge Summary  Peter Nelson GXQ:[id number] DOB: September 07, 1951 DOA: 12/29/2016  PCP: Sharon Seller, NP  Admit date: 12/29/2016 Discharge date: 01/01/2017  Admitted From:  Home  Disposition: Home   Recommendations for Outpatient Follow-up:  1. Follow up with PCP in 1- week 2. New prescriptions given for diuretics and b blocker 3. Patient has been off diuretics for last 7 days before admission, will keep current dose for now and will recommend follow as outpatient. Medical compliance was reinforced.   Home Health: No  Equipment/Devices: No   Discharge Condition: Stable  CODE STATUS: Full  Diet recommendation: Regular    Brief/Interim Summary: This is a 66 year old male who presented to hospital with the chief complaint abdominal pain, melena and hematemesis. Symptoms were present for last 24 hours prior to hospitalization, the abdominal pain was epigastric, associated with melena. No orthostatic symptoms. On initial physical examination he was found to be tachycardic 123 bpm, blood pressure 126/83, respiratory 13, saturation 99%. He was awake and alert, his mucous membranes were moist, his lungs were clear to auscultation bilaterally, heart S1-S2 present tachycardic, 2+ pretibial edema bilaterally. His abdomen was distended, tender to palpation in the epigastric area, positive for ascites. Sodium 142, potassium 3.5, chloride 111, bicarbonate 26, glucose 104, BUN 26, creatinine 1.0, lipase 24, AST 60, ALT 27, white count 6.2, hemoglobin 8.2, hematocrit 24.8, platelets 143. EKG with sinus tachycardia, positive premature ventricular complex.  The patient was admitted to the hospital working diagnosis of acute upper GI bleed with acute blood loss anemia, complicated by ascites..  1. Upper GI bleed with acute blood loss anemia. Patient was admitted to the stepdown unit, he received 2 units of packed red blood cells. He was placed on octreotide infusion as well as continuous  pantoprazole drip. Prophylactic antibiotic with ceftriaxone intravenously. Gastroenterology was consulted and patient underwent upper endoscopy, finding a bleeding small (less than 5 mm) esophageal varices which was completely eradicated by banding, positive ulcer in the vicinity with normal tissue border. Red blood in the cardia,  the gastric fundus and in the gastric body. His hemoglobin and hematocrit remained stable, his diet was advanced progressively with good toleration. Patient will be discharged on pantoprazole twice daily. Discharge hemoglobin 8.5/ hematocrit 25.2.  2. Portal hypertension with ascites. Patient underwent ultrasound-guided paracentesis, 4.2 L were removed, peritoneal fluid white cell count 64. Patient's symptoms improved after paracentesis. Patient has been off diuretic therapy for last 7 days prior to hospitalization. Patient will resume furosemide and spironolactone, nonselective beta blockade, at his home dose, will hold on up titration of diuretics, patient clearly noncompliant with his current dose, concern for kidney injury at increased dose of diuretics. Close follow-up as an outpatient. Note mild elevation in creatinine up to 1.2.   3. Chronic liver failure due to cirrhosis, alcoholic/ hepatitis C. INR 1.5 and total bilirubin is 1.9. No signs of encephalopathy. Continue supportive medical care, close follow-up as an outpatient. Hep C viral load nondetectable on February 2018.  4. Thrombocytopenia. Suspected to be related to portal hypertension along with chronic liver disease, discharge platelet count 99. No signs of bleeding.   5. History of hepatocellular carcinoma. Follow with gastroenterology as an outpatient.   Discharge Diagnoses:  Principal Problem:   Upper GI bleeding Active Problems:   Alcoholic cirrhosis of liver (HCC)   Hepatocellular carcinoma (HCC)   Acute blood loss anemia   Hemorrhage of esophageal varices in alcoholic cirrhosis ([place])   Esophageal  varices in alcoholic cirrhosis ([place])  Acute upper GI bleeding    Discharge Instructions   Allergies as of 01/01/2017      Reactions   Aspirin Other (See Comments)   Does not take because of hep c      Medication List    STOP taking these medications   ibuprofen 200 MG tablet Commonly known as:  ADVIL,MOTRIN     TAKE these medications   folic acid 1 MG tablet Commonly known as:  FOLVITE Take 1 tablet (1 mg total) by mouth daily.   furosemide 40 MG tablet Commonly known as:  LASIX Take 1 tablet (40 mg total) by mouth daily.   nadolol 20 MG tablet Commonly known as:  CORGARD Take 1 tablet (20 mg total) by mouth daily.   pantoprazole 40 MG tablet Commonly known as:  PROTONIX Take 1 tablet (40 mg total) by mouth 2 (two) times daily.   spironolactone 50 MG tablet Commonly known as:  ALDACTONE Take 1 tablet (50 mg total) by mouth daily.   vitamin B-12 1000 MCG tablet Commonly known as:  CYANOCOBALAMIN Take 1,000 mcg by mouth daily.      Follow-up Information    Jerene Bears, MD Follow up on 01/04/2017.   Specialty:  Gastroenterology Why:  Go to lab at office and have blood drawn Contact information: [address] [address] [phone]          Allergies  Allergen Reactions  . Aspirin Other (See Comments)    Does not take because of hep c    Consultations:  Gastroenterology    Procedures/Studies: US Paracentesis  Result Date: 12/30/2016 INDICATION: Patient with history of cirrhosis presents with ascites. Request is made for diagnostic and therapeutic paracentesis. EXAM: ULTRASOUND GUIDED DIAGNOSTIC AND THERAPEUTIC PARACENTESIS MEDICATIONS: 10 mL 1% lidocaine COMPLICATIONS: None immediate. PROCEDURE: Informed written consent was obtained from the patient after a discussion of the risks, benefits and alternatives to treatment. A timeout was performed prior to the initiation of the procedure. Initial ultrasound scanning demonstrates a  large amount of ascites within the right lateral abdomen. The right lateral abdomen was prepped and draped in the usual sterile fashion. 1% lidocaine was used for local anesthesia. Following this, a 19 gauge, 7-cm, Yueh catheter was introduced. An ultrasound image was saved for documentation purposes. The paracentesis was performed. The catheter was removed and a dressing was applied. The patient tolerated the procedure well without immediate post procedural complication. FINDINGS: A total of approximately 4.2 liters of clear, yellow fluid was removed. Samples were sent to the laboratory as requested by the clinical team. IMPRESSION: Successful ultrasound-guided diagnostic and therapeutic paracentesis yielding 4.2 liters of peritoneal fluid. Read by:  Brynda Greathouse PA-C Electronically Signed   By: Sandi Mariscal M.D.   On: 12/30/2016 11:18        Subjective: Patient feeling well, no nausea or vomiting, tolerating po well.   Discharge Exam: Vitals:   12/31/16 2021 01/01/17 0604  BP: (!) 141/75 132/83  Pulse: 75 79  Resp: 18 18  Temp: 98.5 F (36.9 C) 98.6 F (37 C)   Vitals:   12/31/16 0758 12/31/16 1602 12/31/16 2021 01/01/17 0604  BP: 122/68 124/71 (!) 141/75 132/83  Pulse: 80 73 75 79  Resp: 16 17 18 18   Temp: 98.9 F (37.2 C) 98.3 F (36.8 C) 98.5 F (36.9 C) 98.6 F (37 C)  TempSrc: Oral Oral Oral Oral  SpO2: 98% 100% 100% 99%  Weight:    64.9 kg (143 lb  1.3 oz)  Height:        General: Pt is alert, awake, not in acute distress Cardiovascular: RRR, S1/S2 +, no rubs, no gallops Respiratory: CTA bilaterally, no wheezing, no rhonchi Abdominal: Soft, NT, ND, bowel sounds +. No distention.  Extremities: no edema, no cyanosis    The results of significant diagnostics from this hospitalization (including imaging, microbiology, ancillary and laboratory) are listed below for reference.     Microbiology: Recent Results (from the past 240 hour(s))  Blood culture (routine x 2)      Status: None (Preliminary result)   Collection Time: 12/29/16  6:01 PM  Result Value Ref Range Status   Specimen Description BLOOD LEFT HAND  Final   Special Requests BOTTLES DRAWN AEROBIC AND ANAEROBIC 5CC  Final   Culture   Final    NO GROWTH 2 DAYS Performed at [facility], [address], [place], [place] [address]    Report Status PENDING  Incomplete  Blood culture (routine x 2)     Status: None (Preliminary result)   Collection Time: 12/29/16  6:19 PM  Result Value Ref Range Status   Specimen Description BLOOD RIGHT ARM  Final   Special Requests BOTTLES DRAWN AEROBIC AND ANAEROBIC 5CC  Final   Culture   Final    NO GROWTH 2 DAYS Performed at [facility], [place] [address] [place], [place] [address]    Report Status PENDING  Incomplete  MRSA PCR Screening     Status: None   Collection Time: 12/29/16 10:52 PM  Result Value Ref Range Status   MRSA by PCR NEGATIVE NEGATIVE Final    Comment:        The GeneXpert MRSA Assay (FDA approved for NASAL specimens only), is one component of a comprehensive MRSA colonization surveillance program. It is not intended to diagnose MRSA infection nor to guide or monitor treatment for MRSA infections.   Culture, body fluid-bottle     Status: None (Preliminary result)   Collection Time: 12/30/16 10:17 AM  Result Value Ref Range Status   Specimen Description PERITONEAL  Final   Special Requests NONE  Final   Culture   Final    NO GROWTH 1 DAY Performed at [facility], [place] [address] [place], [place] [address]    Report Status PENDING  Incomplete  Gram stain     Status: None   Collection Time: 12/30/16 10:17 AM  Result Value Ref Range Status   Specimen Description PERITONEAL  Final   Special Requests NONE  Final   Gram Stain   Final    RARE WBC PRESENT, PREDOMINANTLY PMN NO ORGANISMS SEEN Performed at [facility], [place] [address] [place], [place] [address]    Report Status 12/30/2016 FINAL  Final      Labs: BNP (last 3 results) No results for input(s): BNP in the last 8760 hours. Basic Metabolic Panel:  Recent Labs Lab 12/29/16 1700 12/29/16 1733 12/30/16 0824 12/31/16 0443 01/01/17 0545  NA 142 147* 146* 142 139  K 3.5 3.7 4.1 3.5 3.3*  CL 111 109 119* 116* 112*  CO2 26  --  24 23 21*  GLUCOSE 104* 101* 95 95 105*  BUN 26* 26* 32* 27* 20  CREATININE 1.00 0.90 1.11 1.17 1.20  CALCIUM 8.3*  --  7.6* 7.3* 7.4*   Liver Function Tests:  Recent Labs Lab 12/29/16 1700 12/30/16 0824  AST 60* 47*  ALT 27 21  ALKPHOS  94 68  BILITOT 1.9* 2.2*  PROT 7.1 5.4*  ALBUMIN 2.3* 1.8*    Recent Labs Lab 12/29/16 1700  LIPASE 24   No results for input(s): AMMONIA in the last 168 hours. CBC:  Recent Labs Lab 12/29/16 1700 12/29/16 1733 12/29/16 1802 12/31/16 0443 01/01/17 0545  WBC 6.2  --   --  7.2 8.0  NEUTROABS  --   --  3.9 3.6 4.0  HGB 8.2* 8.2*  --  8.0* 8.5*  HCT 24.8* 24.0*  --  23.8* 25.2*  MCV 95.8  --   --  92.2 94.4  PLT 143*  --   --  94* 99*   Cardiac Enzymes: No results for input(s): CKTOTAL, CKMB, CKMBINDEX, TROPONINI in the last 168 hours. BNP: Invalid input(s): POCBNP CBG:  Recent Labs Lab 12/30/16 0748 12/31/16 0810  GLUCAP 87 100*   D-Dimer No results for input(s): DDIMER in the last 72 hours. Hgb A1c No results for input(s): HGBA1C in the last 72 hours. Lipid Profile No results for input(s): CHOL, HDL, LDLCALC, TRIG, CHOLHDL, LDLDIRECT in the last 72 hours. Thyroid function studies No results for input(s): TSH, T4TOTAL, T3FREE, THYROIDAB in the last 72 hours.  Invalid input(s): FREET3 Anemia work up No results for input(s): VITAMINB12, FOLATE, FERRITIN, TIBC, IRON, RETICCTPCT in the last 72 hours. Urinalysis    Component Value Date/Time   COLORURINE YELLOW 12/30/2016 0612   APPEARANCEUR CLEAR 12/30/2016 0612   LABSPEC 1.019 12/30/2016 0612   PHURINE 5.0 12/30/2016 0612   GLUCOSEU NEGATIVE 12/30/2016 0612   HGBUR NEGATIVE  12/30/2016 0612   BILIRUBINUR NEGATIVE 12/30/2016 0612   KETONESUR 5 (A) 12/30/2016 0612   PROTEINUR NEGATIVE 12/30/2016 0612   UROBILINOGEN 4.0 (H) 10/01/2016 1006   NITRITE NEGATIVE 12/30/2016 0612   LEUKOCYTESUR NEGATIVE 12/30/2016 0612   Sepsis Labs Invalid input(s): PROCALCITONIN,  WBC,  LACTICIDVEN Microbiology Recent Results (from the past 240 hour(s))  Blood culture (routine x 2)     Status: None (Preliminary result)   Collection Time: 12/29/16  6:01 PM  Result Value Ref Range Status   Specimen Description BLOOD LEFT HAND  Final   Special Requests BOTTLES DRAWN AEROBIC AND ANAEROBIC 5CC  Final   Culture   Final    NO GROWTH 2 DAYS Performed at [facility], [place] [address], [place], [place] [address]    Report Status PENDING  Incomplete  Blood culture (routine x 2)     Status: None (Preliminary result)   Collection Time: 12/29/16  6:19 PM  Result Value Ref Range Status   Specimen Description BLOOD RIGHT ARM  Final   Special Requests BOTTLES DRAWN AEROBIC AND ANAEROBIC 5CC  Final   Culture   Final    NO GROWTH 2 DAYS Performed at [facility], [place] [address] [place], [place] [address]    Report Status PENDING  Incomplete  MRSA PCR Screening     Status: None   Collection Time: 12/29/16 10:52 PM  Result Value Ref Range Status   MRSA by PCR NEGATIVE NEGATIVE Final    Comment:        The GeneXpert MRSA Assay (FDA approved for NASAL specimens only), is one component of a comprehensive MRSA colonization surveillance program. It is not intended to diagnose MRSA infection nor to guide or monitor treatment for MRSA infections.   Culture, body fluid-bottle     Status: None (Preliminary result)   Collection Time: 12/30/16 10:17 AM  Result Value Ref Range Status  Specimen Description PERITONEAL  Final   Special Requests NONE  Final   Culture   Final    NO GROWTH 1 DAY Performed at [place] [address], [place], [place] [address]    Report  Status PENDING  Incomplete  Gram stain     Status: None   Collection Time: 12/30/16 10:17 AM  Result Value Ref Range Status   Specimen Description PERITONEAL  Final   Special Requests NONE  Final   Gram Stain   Final    RARE WBC PRESENT, PREDOMINANTLY PMN NO ORGANISMS SEEN Performed at [facility], [place] [address], [place], [place] [address]    Report Status 12/30/2016 FINAL  Final     Time coordinating discharge: Over 45 minutes  SIGNED:   Tawni Millers, MD  [facility] 01/01/2017, 9:24 AM Pager   If 7PM-7AM, please contact night-coverage [email or web address] Password [email or web address]

## 2017-01-03 LAB — CULTURE, BLOOD (ROUTINE X 2)
CULTURE: NO GROWTH
CULTURE: NO GROWTH

## 2017-01-04 ENCOUNTER — Other Ambulatory Visit (INDEPENDENT_AMBULATORY_CARE_PROVIDER_SITE_OTHER): Payer: Medicare HMO

## 2017-01-04 ENCOUNTER — Other Ambulatory Visit: Payer: Self-pay

## 2017-01-04 DIAGNOSIS — K921 Melena: Secondary | ICD-10-CM | POA: Diagnosis not present

## 2017-01-04 DIAGNOSIS — K7031 Alcoholic cirrhosis of liver with ascites: Secondary | ICD-10-CM

## 2017-01-04 LAB — COMPREHENSIVE METABOLIC PANEL
ALBUMIN: 2.3 g/dL — AB (ref 3.5–5.2)
ALT: 31 U/L (ref 0–53)
AST: 76 U/L — AB (ref 0–37)
Alkaline Phosphatase: 123 U/L — ABNORMAL HIGH (ref 39–117)
BUN: 16 mg/dL (ref 6–23)
CHLORIDE: 104 meq/L (ref 96–112)
CO2: 25 meq/L (ref 19–32)
Calcium: 7.9 mg/dL — ABNORMAL LOW (ref 8.4–10.5)
Creatinine, Ser: 1.05 mg/dL (ref 0.40–1.50)
GFR: 90.97 mL/min (ref >60.00)
Glucose, Bld: 116 mg/dL — ABNORMAL HIGH (ref 70–99)
POTASSIUM: 3 meq/L — AB (ref 3.5–5.1)
Sodium: 136 mEq/L (ref 135–145)
Total Bilirubin: 1.3 mg/dL — ABNORMAL HIGH (ref 0.2–1.2)
Total Protein: 6.7 g/dL (ref 6.0–8.3)

## 2017-01-04 LAB — CBC WITH DIFFERENTIAL/PLATELET
Basophils Absolute: 0.1 10*3/uL (ref 0.0–0.1)
Basophils Relative: 1.1 % (ref 0.0–3.0)
EOS ABS: 0.4 10*3/uL (ref 0.0–0.7)
EOS PCT: 5.2 % — AB (ref 0.0–5.0)
HCT: 28.8 % — ABNORMAL LOW (ref 39.0–52.0)
HEMOGLOBIN: 10.1 g/dL — AB (ref 13.0–17.0)
LYMPHS ABS: 2 10*3/uL (ref 0.7–4.0)
Lymphocytes Relative: 26.8 % (ref 12.0–46.0)
MCHC: 34.9 g/dL (ref 30.0–36.0)
MCV: 93.7 fl (ref 78.0–100.0)
Monocytes Absolute: 0.7 10*3/uL (ref 0.1–1.0)
Monocytes Relative: 8.8 % (ref 3.0–12.0)
NEUTROS ABS: 4.4 10*3/uL (ref 1.4–7.7)
NEUTROS PCT: 58.1 % (ref 43.0–77.0)
PLATELETS: 152 10*3/uL (ref 150.0–400.0)
RBC: 3.07 Mil/uL — ABNORMAL LOW (ref 4.22–5.81)
RDW: 17.3 % — AB (ref 11.5–15.5)
WBC: 7.5 10*3/uL (ref 4.0–10.5)

## 2017-01-04 LAB — CULTURE, BODY FLUID W GRAM STAIN -BOTTLE: Culture: NO GROWTH

## 2017-01-04 LAB — CULTURE, BODY FLUID-BOTTLE

## 2017-01-08 ENCOUNTER — Telehealth: Payer: Self-pay | Admitting: Internal Medicine

## 2017-01-08 NOTE — Telephone Encounter (Signed)
Called by the patient's sister Peter Nelson with a chief complaint of swelling of the lower extremity and abdomen. Chart reviewed. Hepatic cirrhosis with hepatoma and portal hypertension. Problems with variceal bleed and ascites. Also reviewed laboratory results and comments by Dr. [name] from March 20. Diuretics increased. He was at his sister's house without his medications but it sounds like he is taking Lasix 40 mg twice daily and Aldactone 50 mg 3 times daily which represents a change from his dose prior to March 20. In any event, no shortness of breath, fever or other complaints  No pain, just fullness. I inquired about dietary sodium. He thought that he was told not to consume pepper but salt was okay. I informed them that he needs to have a sodium restricted diet. He will continue on his recently adjusted dose of diuretics and contact the office Monday if he is still having persistent or worsening problems, as he may need paracentesis. They understood and were appreciated. I will forward this note to Dr. [name].

## 2017-01-10 ENCOUNTER — Telehealth: Payer: Self-pay | Admitting: Gastroenterology

## 2017-01-10 NOTE — Telephone Encounter (Signed)
Pts fiance is working on his car and told her he was very uncomfortable and feels like he needs to go to the ER to have a LVP. Pt states he is going to come home for her to take him to the hospital. Instructed her to call us back if they needed anything further. She verbalized understanding.

## 2017-01-10 NOTE — Telephone Encounter (Signed)
Left message for Peter Nelson to call back.

## 2017-01-10 NOTE — Telephone Encounter (Signed)
Thanks to Dr. Henrene Pastor for taking this call and management recommendations I agree Please check with pt and/or sister to see how he is doing May need LVP

## 2017-01-10 NOTE — Telephone Encounter (Signed)
See additional phone note.

## 2017-01-11 ENCOUNTER — Encounter (HOSPITAL_COMMUNITY): Payer: Self-pay | Admitting: Emergency Medicine

## 2017-01-11 ENCOUNTER — Observation Stay (HOSPITAL_COMMUNITY): Payer: Medicare HMO

## 2017-01-11 ENCOUNTER — Observation Stay (HOSPITAL_COMMUNITY)
Admission: EM | Admit: 2017-01-11 | Discharge: 2017-01-12 | Disposition: A | Discharge status: Home / Self Care | Payer: Medicare HMO | Attending: Internal Medicine | Admitting: Internal Medicine

## 2017-01-11 DIAGNOSIS — E876 Hypokalemia: Secondary | ICD-10-CM | POA: Diagnosis not present

## 2017-01-11 DIAGNOSIS — N179 Acute kidney failure, unspecified: Secondary | ICD-10-CM | POA: Diagnosis not present

## 2017-01-11 DIAGNOSIS — F17210 Nicotine dependence, cigarettes, uncomplicated: Secondary | ICD-10-CM | POA: Insufficient documentation

## 2017-01-11 DIAGNOSIS — D649 Anemia, unspecified: Secondary | ICD-10-CM | POA: Diagnosis not present

## 2017-01-11 DIAGNOSIS — Z79899 Other long term (current) drug therapy: Secondary | ICD-10-CM | POA: Insufficient documentation

## 2017-01-11 DIAGNOSIS — R188 Other ascites: Secondary | ICD-10-CM

## 2017-01-11 DIAGNOSIS — B182 Chronic viral hepatitis C: Secondary | ICD-10-CM | POA: Diagnosis not present

## 2017-01-11 DIAGNOSIS — T502X5A Adverse effect of carbonic-anhydrase inhibitors, benzothiadiazides and other diuretics, initial encounter: Secondary | ICD-10-CM | POA: Diagnosis not present

## 2017-01-11 DIAGNOSIS — Z8579 Personal history of other malignant neoplasms of lymphoid, hematopoietic and related tissues: Secondary | ICD-10-CM | POA: Insufficient documentation

## 2017-01-11 DIAGNOSIS — K7030 Alcoholic cirrhosis of liver without ascites: Secondary | ICD-10-CM | POA: Diagnosis present

## 2017-01-11 DIAGNOSIS — Z8719 Personal history of other diseases of the digestive system: Secondary | ICD-10-CM

## 2017-01-11 DIAGNOSIS — K7031 Alcoholic cirrhosis of liver with ascites: Secondary | ICD-10-CM | POA: Diagnosis not present

## 2017-01-11 DIAGNOSIS — F17200 Nicotine dependence, unspecified, uncomplicated: Secondary | ICD-10-CM | POA: Diagnosis present

## 2017-01-11 DIAGNOSIS — R600 Localized edema: Secondary | ICD-10-CM | POA: Diagnosis not present

## 2017-01-11 DIAGNOSIS — R609 Edema, unspecified: Secondary | ICD-10-CM | POA: Diagnosis present

## 2017-01-11 DIAGNOSIS — I8510 Secondary esophageal varices without bleeding: Secondary | ICD-10-CM | POA: Diagnosis present

## 2017-01-11 HISTORY — DX: Unspecified cirrhosis of liver: K74.60

## 2017-01-11 LAB — URINALYSIS, ROUTINE W REFLEX MICROSCOPIC
Bilirubin Urine: NEGATIVE
GLUCOSE, UA: NEGATIVE mg/dL
Hgb urine dipstick: NEGATIVE
KETONES UR: 5 mg/dL — AB
LEUKOCYTES UA: NEGATIVE
Nitrite: NEGATIVE
PH: 5 (ref 5.0–8.0)
Protein, ur: NEGATIVE mg/dL
Specific Gravity, Urine: 1.031 — ABNORMAL HIGH (ref 1.005–1.030)

## 2017-01-11 LAB — COMPREHENSIVE METABOLIC PANEL
ALBUMIN: 2.1 g/dL — AB (ref 3.5–5.0)
ALK PHOS: 108 U/L (ref 38–126)
ALT: 33 U/L (ref 17–63)
AST: 73 U/L — AB (ref 15–41)
Anion gap: 5 (ref 5–15)
BILIRUBIN TOTAL: 1.8 mg/dL — AB (ref 0.3–1.2)
BUN: 16 mg/dL (ref 6–20)
CALCIUM: 7.9 mg/dL — AB (ref 8.9–10.3)
CO2: 28 mmol/L (ref 22–32)
Chloride: 107 mmol/L (ref 101–111)
Creatinine, Ser: 1.27 mg/dL — ABNORMAL HIGH (ref 0.61–1.24)
GFR calc Af Amer: >60 mL/min (ref >60)
GFR calc non Af Amer: 58 mL/min — ABNORMAL LOW (ref >60)
GLUCOSE: 113 mg/dL — AB (ref 65–99)
Potassium: 2.8 mmol/L — ABNORMAL LOW (ref 3.5–5.1)
Sodium: 140 mmol/L (ref 135–145)
TOTAL PROTEIN: 6.9 g/dL (ref 6.5–8.1)

## 2017-01-11 LAB — CBC WITH DIFFERENTIAL/PLATELET
BASOS ABS: 0.1 10*3/uL (ref 0.0–0.1)
BASOS PCT: 1 %
EOS PCT: 8 %
Eosinophils Absolute: 0.5 10*3/uL (ref 0.0–0.7)
HCT: 27.7 % — ABNORMAL LOW (ref 39.0–52.0)
Hemoglobin: 9.5 g/dL — ABNORMAL LOW (ref 13.0–17.0)
LYMPHS PCT: 24 %
Lymphs Abs: 1.7 10*3/uL (ref 0.7–4.0)
MCH: 32 pg (ref 26.0–34.0)
MCHC: 34.3 g/dL (ref 30.0–36.0)
MCV: 93.3 fL (ref 78.0–100.0)
MONO ABS: 0.7 10*3/uL (ref 0.1–1.0)
Monocytes Relative: 10 %
Neutro Abs: 4.1 10*3/uL (ref 1.7–7.7)
Neutrophils Relative %: 57 %
PLATELETS: 179 10*3/uL (ref 150–400)
RBC: 2.97 MIL/uL — ABNORMAL LOW (ref 4.22–5.81)
RDW: 16.9 % — AB (ref 11.5–15.5)
WBC: 7.1 10*3/uL (ref 4.0–10.5)

## 2017-01-11 LAB — PROTIME-INR
INR: 1.46
PROTHROMBIN TIME: 17.9 s — AB (ref 11.4–15.2)

## 2017-01-11 MED ORDER — ALBUMIN HUMAN 25 % IV SOLN
25.0000 g | Freq: Once | INTRAVENOUS | Status: AC
  Administered 2017-01-11: 25 g via INTRAVENOUS
  Filled 2017-01-11: qty 100

## 2017-01-11 MED ORDER — POTASSIUM CHLORIDE 10 MEQ/100ML IV SOLN
10.0000 meq | INTRAVENOUS | Status: AC
  Administered 2017-01-11 (×2): 10 meq via INTRAVENOUS
  Filled 2017-01-11 (×3): qty 100

## 2017-01-11 MED ORDER — SODIUM CHLORIDE 0.9 % IV SOLN: 30.0000 meq | Freq: Once | INTRAVENOUS | Status: DC

## 2017-01-11 MED ORDER — FENTANYL CITRATE (PF) 100 MCG/2ML IJ SOLN
50.0000 ug | Freq: Once | INTRAMUSCULAR | Status: AC
  Administered 2017-01-11: 50 ug via INTRAVENOUS
  Filled 2017-01-11: qty 2

## 2017-01-11 MED ORDER — FOLIC ACID 1 MG PO TABS
1.0000 mg | ORAL_TABLET | Freq: Every day | ORAL | Status: DC
  Administered 2017-01-11 – 2017-01-12 (×2): 1 mg via ORAL
  Filled 2017-01-11 (×2): qty 1

## 2017-01-11 MED ORDER — FUROSEMIDE 40 MG PO TABS
40.0000 mg | ORAL_TABLET | Freq: Every day | ORAL | Status: DC
  Administered 2017-01-12: 40 mg via ORAL
  Filled 2017-01-11: qty 1

## 2017-01-11 MED ORDER — VITAMIN B-12 1000 MCG PO TABS
1000.0000 ug | ORAL_TABLET | Freq: Every day | ORAL | Status: DC
  Administered 2017-01-11 – 2017-01-12 (×2): 1000 ug via ORAL
  Filled 2017-01-11 (×2): qty 1

## 2017-01-11 MED ORDER — POTASSIUM CHLORIDE CRYS ER 20 MEQ PO TBCR
20.0000 meq | EXTENDED_RELEASE_TABLET | Freq: Once | ORAL | Status: AC
  Administered 2017-01-11: 20 meq via ORAL
  Filled 2017-01-11: qty 1

## 2017-01-11 MED ORDER — PANTOPRAZOLE SODIUM 40 MG PO TBEC
40.0000 mg | DELAYED_RELEASE_TABLET | Freq: Two times a day (BID) | ORAL | Status: DC
  Administered 2017-01-11 – 2017-01-12 (×2): 40 mg via ORAL
  Filled 2017-01-11 (×2): qty 1

## 2017-01-11 MED ORDER — SPIRONOLACTONE 25 MG PO TABS: 50.0000 mg | ORAL_TABLET | Freq: Every day | ORAL | Status: DC

## 2017-01-11 MED ORDER — NADOLOL 20 MG PO TABS
20.0000 mg | ORAL_TABLET | Freq: Every day | ORAL | Status: DC
  Administered 2017-01-11 – 2017-01-12 (×2): 20 mg via ORAL
  Filled 2017-01-11 (×2): qty 1

## 2017-01-11 NOTE — ED Triage Notes (Signed)
Pt.reports upper abdominal pain with abdominal distention, and leg swelling for a week. Pt. Was recently discharged on 3/17 for the same complaint after 4.3 L was removed.

## 2017-01-11 NOTE — Procedures (Signed)
Ultrasound-guided therapeutic paracentesis performed yielding 5.7 liters of yellow colored fluid. No immediate complications.  Emoree Sasaki E 4:31 PM 01/11/2017
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
## 2017-01-11 NOTE — ED Notes (Signed)
Made Dr Rex Kras aware patient requesting something for pain

## 2017-01-11 NOTE — ED Notes (Signed)
Attempted to call report. Nurse receiving patient is on lunch and unable to take report at this time.
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
## 2017-01-12 ENCOUNTER — Other Ambulatory Visit: Payer: Self-pay | Admitting: Nurse Practitioner

## 2017-01-12 DIAGNOSIS — I8510 Secondary esophageal varices without bleeding: Secondary | ICD-10-CM

## 2017-01-12 DIAGNOSIS — K7030 Alcoholic cirrhosis of liver without ascites: Secondary | ICD-10-CM

## 2017-01-12 DIAGNOSIS — K7031 Alcoholic cirrhosis of liver with ascites: Secondary | ICD-10-CM | POA: Diagnosis not present

## 2017-01-12 DIAGNOSIS — N179 Acute kidney failure, unspecified: Secondary | ICD-10-CM | POA: Diagnosis not present

## 2017-01-12 DIAGNOSIS — E876 Hypokalemia: Secondary | ICD-10-CM | POA: Diagnosis not present

## 2017-01-12 DIAGNOSIS — B182 Chronic viral hepatitis C: Secondary | ICD-10-CM

## 2017-01-12 DIAGNOSIS — R609 Edema, unspecified: Secondary | ICD-10-CM

## 2017-01-12 DIAGNOSIS — R188 Other ascites: Secondary | ICD-10-CM

## 2017-01-12 LAB — COMPREHENSIVE METABOLIC PANEL
ALBUMIN: 2.2 g/dL — AB (ref 3.5–5.0)
ALT: 27 U/L (ref 17–63)
AST: 58 U/L — AB (ref 15–41)
Alkaline Phosphatase: 95 U/L (ref 38–126)
Anion gap: 5 (ref 5–15)
BILIRUBIN TOTAL: 1.7 mg/dL — AB (ref 0.3–1.2)
BUN: 13 mg/dL (ref 6–20)
CO2: 25 mmol/L (ref 22–32)
Calcium: 7.7 mg/dL — ABNORMAL LOW (ref 8.9–10.3)
Chloride: 109 mmol/L (ref 101–111)
Creatinine, Ser: 1.13 mg/dL (ref 0.61–1.24)
GFR calc Af Amer: >60 mL/min (ref >60)
GFR calc non Af Amer: >60 mL/min (ref >60)
GLUCOSE: 92 mg/dL (ref 65–99)
POTASSIUM: 2.9 mmol/L — AB (ref 3.5–5.1)
Sodium: 139 mmol/L (ref 135–145)
TOTAL PROTEIN: 6.2 g/dL — AB (ref 6.5–8.1)

## 2017-01-12 LAB — CBC
HEMATOCRIT: 24.6 % — AB (ref 39.0–52.0)
Hemoglobin: 8.4 g/dL — ABNORMAL LOW (ref 13.0–17.0)
MCH: 31.8 pg (ref 26.0–34.0)
MCHC: 34.1 g/dL (ref 30.0–36.0)
MCV: 93.2 fL (ref 78.0–100.0)
Platelets: 145 10*3/uL — ABNORMAL LOW (ref 150–400)
RBC: 2.64 MIL/uL — ABNORMAL LOW (ref 4.22–5.81)
RDW: 16.8 % — AB (ref 11.5–15.5)
WBC: 6.9 10*3/uL (ref 4.0–10.5)

## 2017-01-12 LAB — PROTIME-INR
INR: 1.5
Prothrombin Time: 18.2 seconds — ABNORMAL HIGH (ref 11.4–15.2)

## 2017-01-12 LAB — BASIC METABOLIC PANEL
ANION GAP: 6 (ref 5–15)
BUN: 13 mg/dL (ref 6–20)
CHLORIDE: 109 mmol/L (ref 101–111)
CO2: 26 mmol/L (ref 22–32)
Calcium: 7.8 mg/dL — ABNORMAL LOW (ref 8.9–10.3)
Creatinine, Ser: 1.18 mg/dL (ref 0.61–1.24)
GFR calc non Af Amer: >60 mL/min (ref >60)
GLUCOSE: 106 mg/dL — AB (ref 65–99)
POTASSIUM: 3.4 mmol/L — AB (ref 3.5–5.1)
Sodium: 141 mmol/L (ref 135–145)

## 2017-01-12 LAB — MAGNESIUM: Magnesium: 1.8 mg/dL (ref 1.7–2.4)

## 2017-01-12 MED ORDER — POTASSIUM CHLORIDE CRYS ER 20 MEQ PO TBCR
40.0000 meq | EXTENDED_RELEASE_TABLET | Freq: Once | ORAL | Status: AC
  Administered 2017-01-12: 40 meq via ORAL
  Filled 2017-01-12: qty 2

## 2017-01-12 MED ORDER — SPIRONOLACTONE 50 MG PO TABS
50.0000 mg | ORAL_TABLET | Freq: Three times a day (TID) | ORAL | 0 refills | Status: DC
Start: 2017-01-12 — End: 2017-01-21

## 2017-01-12 MED ORDER — FUROSEMIDE 40 MG PO TABS: 40.0000 mg | ORAL_TABLET | Freq: Once | ORAL | Status: DC

## 2017-01-12 MED ORDER — SPIRONOLACTONE 25 MG PO TABS
100.0000 mg | ORAL_TABLET | Freq: Every day | ORAL | Status: DC
  Administered 2017-01-12: 100 mg via ORAL
  Filled 2017-01-12: qty 4

## 2017-01-12 MED ORDER — FUROSEMIDE 40 MG PO TABS: 80.0000 mg | ORAL_TABLET | Freq: Two times a day (BID) | ORAL | 0 refills | Status: DC

## 2017-01-12 MED ORDER — POTASSIUM CHLORIDE CRYS ER 20 MEQ PO TBCR
40.0000 meq | EXTENDED_RELEASE_TABLET | ORAL | Status: AC
  Administered 2017-01-12 (×2): 40 meq via ORAL
  Filled 2017-01-12 (×2): qty 2

## 2017-01-12 MED ORDER — POTASSIUM CHLORIDE CRYS ER 20 MEQ PO TBCR: 40.0000 meq | EXTENDED_RELEASE_TABLET | Freq: Two times a day (BID) | ORAL | 0 refills | Status: DC

## 2017-01-12 NOTE — Discharge Summary (Signed)
Physician Discharge Summary  [name] RCV:[id number] DOB: Oct 03, 1951 DOA: 01/11/2017  PCP: Sharon Seller, NP  Admit date: 01/11/2017 Discharge date: 01/12/2017  Admitted From: home Disposition:  home   Recommendations for Outpatient Follow-up:  1. Has been drinking at least 2.5 L of fluid a day- has been asked to drink < 1 L- follow up as outpt 2. Lasix dose doubled and K+ added 3. Asked Alen Bleacher to arrange appt for GI f/u in 5 days for Bmet, Mg and assessment of fluid status.   Home Health: none Equipment/Devices:  none    Discharge Condition:  stable   CODE STATUS:  Full code   Diet recommendation:  Low sodium, heart healthy, < 1 L fluid a day Consultations:  none    Discharge Diagnoses:  Principal Problem:   Ascites Active Problems:   Acute hypokalemia   Tobacco use disorder   Chronic hepatitis C without hepatic coma (HCC)   Alcoholic cirrhosis of liver (HCC)   Esophageal varices in alcoholic cirrhosis (HCC)    Subjective: Abdomen feels much better. No other complaints other than not wanting to go home.   Brief Summary: Peter Nelson is a 66 y.o. male with medical history significant of end-stage liver disease/hepatic cirrhosis due to alcohol abuse, hepatitis C, presents to emergency room with chief complaint of progressive swelling of abdomen and legs. Severe abdominal distension prompted him to call his GI office and then come to the ER.   He was discharged from the hospital on 3/17 after banding of esophageal varices and a paracentesis. After discharge, he states, his Lasix was increased from 40 mg daily to BID and Aldactone from 50 mg daily to TID. He tells me he was taking meds exactly as prescribed. Upon questioning on fluid intake, he addmited to drinking lots of water and 2 bottles of gator-aid a day. When asked to compare fluid intake to the pitcher at bedside, he states he drinks equivalent to 2.5 of them a day.   Hospital Course:  Ascites with  discomfort - underwent paracentesis of 5.7 L clear fluid yesterday- still appears to have a moderate ascites - increase Lasix from 40 BID to 80 BID- discussed fluid restriction to < 1 L a day (doubt he will actually do this much but might drop to < 2 L) - cont Aldactone at current dose   Hypokalemia - replacing- adding kdur to meds - Mg normal  Needs labs, as mentioned above, in 5 days to check K, Mg and renal function along with assessment of fluid status.   Discharge Instructions   Allergies as of 01/12/2017      Reactions   Aspirin Other (See Comments)   Does not take because of hep c      Medication List    TAKE these medications   folic acid 1 MG tablet Commonly known as:  FOLVITE Take 1 tablet (1 mg total) by mouth daily.   furosemide 40 MG tablet Commonly known as:  LASIX Take 2 tablets (80 mg total) by mouth 2 (two) times daily. What changed:  how much to take  when to take this   nadolol 20 MG tablet Commonly known as:  CORGARD Take 1 tablet (20 mg total) by mouth daily.   pantoprazole 40 MG tablet Commonly known as:  PROTONIX Take 1 tablet (40 mg total) by mouth 2 (two) times daily.   potassium chloride SA 20 MEQ tablet Commonly known as:  K-DUR,KLOR-CON Take 2 tablets (40 mEq  total) by mouth 2 (two) times daily.   spironolactone 50 MG tablet Commonly known as:  ALDACTONE Take 1 tablet (50 mg total) by mouth 3 (three) times daily.   vitamin B-12 1000 MCG tablet Commonly known as:  CYANOCOBALAMIN Take 1,000 mcg by mouth daily.       Allergies  Allergen Reactions  . Aspirin Other (See Comments)    Does not take because of hep c     Procedures/Studies:   US Paracentesis  Result Date: 01/11/2017 INDICATION: Recurrent abdominal ascites secondary to alcoholic cirrhosis. Request is made for therapeutic paracentesis. EXAM: ULTRASOUND GUIDED THERAPEUTIC PARACENTESIS MEDICATIONS: 1% lidocaine COMPLICATIONS: None immediate. PROCEDURE: Informed  written consent was obtained from the patient after a discussion of the risks, benefits and alternatives to treatment. A timeout was performed prior to the initiation of the procedure. Initial ultrasound scanning demonstrates a large amount of ascites within the right mid abdominal quadrant. The right mid abdomen was prepped and draped in the usual sterile fashion. 1% lidocaine was used for local anesthesia. Following this, a 19 gauge, 7-cm, Yueh catheter was introduced. An ultrasound image was saved for documentation purposes. The paracentesis was performed. The catheter was removed and a dressing was applied. The patient tolerated the procedure well without immediate post procedural complication. FINDINGS: A total of approximately 5.7 L of clear yellow fluid was removed. IMPRESSION: Successful ultrasound-guided paracentesis yielding 5.7 liters of peritoneal fluid. Read by: Saverio Danker, PA-C Electronically Signed   By: Jacqulynn Cadet M.D.   On: 01/11/2017 16:33   US Paracentesis  Result Date: 12/30/2016 INDICATION: Patient with history of cirrhosis presents with ascites. Request is made for diagnostic and therapeutic paracentesis. EXAM: ULTRASOUND GUIDED DIAGNOSTIC AND THERAPEUTIC PARACENTESIS MEDICATIONS: 10 mL 1% lidocaine COMPLICATIONS: None immediate. PROCEDURE: Informed written consent was obtained from the patient after a discussion of the risks, benefits and alternatives to treatment. A timeout was performed prior to the initiation of the procedure. Initial ultrasound scanning demonstrates a large amount of ascites within the right lateral abdomen. The right lateral abdomen was prepped and draped in the usual sterile fashion. 1% lidocaine was used for local anesthesia. Following this, a 19 gauge, 7-cm, Yueh catheter was introduced. An ultrasound image was saved for documentation purposes. The paracentesis was performed. The catheter was removed and a dressing was applied. The patient tolerated the  procedure well without immediate post procedural complication. FINDINGS: A total of approximately 4.2 liters of clear, yellow fluid was removed. Samples were sent to the laboratory as requested by the clinical team. IMPRESSION: Successful ultrasound-guided diagnostic and therapeutic paracentesis yielding 4.2 liters of peritoneal fluid. Read by:  Brynda Greathouse PA-C Electronically Signed   By: Sandi Mariscal M.D.   On: 12/30/2016 11:18       Discharge Exam: Vitals:   01/11/17 2113 01/12/17 0454  BP: 140/79 112/66  Pulse: 88 95  Resp: 18 18  Temp: 98.7 F (37.1 C) 98.7 F (37.1 C)   Vitals:   01/11/17 1619 01/11/17 1800 01/11/17 2113 01/12/17 0454  BP: 137/73 140/65 140/79 112/66  Pulse:  82 88 95  Resp:  18 18 18   Temp:  97.3 F (36.3 C) 98.7 F (37.1 C) 98.7 F (37.1 C)  TempSrc:  Oral Oral Oral  SpO2:  97% 100% 98%  Weight:  64.9 kg (143 lb 1.3 oz)    Height:  5' 6"  (1.676 m)      General: Pt is alert, awake, not in acute distress Cardiovascular: RRR, S1/S2 +,  no rubs, no gallops Respiratory: CTA bilaterally, no wheezing, no rhonchi Abdominal: Soft, NT, moderately distended, + fluid wave, tympanic only around umbilicus, rest is dull to percussion, bowel sounds + Extremities: no edema, no cyanosis    The results of significant diagnostics from this hospitalization (including imaging, microbiology, ancillary and laboratory) are listed below for reference.     Microbiology: No results found for this or any previous visit (from the past 240 hour(s)).   Labs: BNP (last 3 results) No results for input(s): BNP in the last 8760 hours. Basic Metabolic Panel:  Recent Labs Lab 01/11/17 0848 01/12/17 0539  NA 140 139  K 2.8* 2.9*  CL 107 109  CO2 28 25  GLUCOSE 113* 92  BUN 16 13  CREATININE 1.27* 1.13  CALCIUM 7.9* 7.7*  MG  --  1.8   Liver Function Tests:  Recent Labs Lab 01/11/17 0848 01/12/17 0539  AST 73* 58*  ALT 33 27  ALKPHOS 108 95  BILITOT 1.8* 1.7*   PROT 6.9 6.2*  ALBUMIN 2.1* 2.2*   No results for input(s): LIPASE, AMYLASE in the last 168 hours. No results for input(s): AMMONIA in the last 168 hours. CBC:  Recent Labs Lab 01/11/17 0848 01/12/17 0539  WBC 7.1 6.9  NEUTROABS 4.1  --   HGB 9.5* 8.4*  HCT 27.7* 24.6*  MCV 93.3 93.2  PLT 179 145*   Cardiac Enzymes: No results for input(s): CKTOTAL, CKMB, CKMBINDEX, TROPONINI in the last 168 hours. BNP: Invalid input(s): POCBNP CBG: No results for input(s): GLUCAP in the last 168 hours. D-Dimer No results for input(s): DDIMER in the last 72 hours. Hgb A1c No results for input(s): HGBA1C in the last 72 hours. Lipid Profile No results for input(s): CHOL, HDL, LDLCALC, TRIG, CHOLHDL, LDLDIRECT in the last 72 hours. Thyroid function studies No results for input(s): TSH, T4TOTAL, T3FREE, THYROIDAB in the last 72 hours.  Invalid input(s): FREET3 Anemia work up No results for input(s): VITAMINB12, FOLATE, FERRITIN, TIBC, IRON, RETICCTPCT in the last 72 hours. Urinalysis    Component Value Date/Time   COLORURINE AMBER (A) 01/11/2017 1022   APPEARANCEUR HAZY (A) 01/11/2017 1022   LABSPEC 1.031 (H) 01/11/2017 1022   PHURINE 5.0 01/11/2017 1022   GLUCOSEU NEGATIVE 01/11/2017 1022   HGBUR NEGATIVE 01/11/2017 1022   BILIRUBINUR NEGATIVE 01/11/2017 1022   KETONESUR 5 (A) 01/11/2017 1022   PROTEINUR NEGATIVE 01/11/2017 1022   UROBILINOGEN 4.0 (H) 10/01/2016 1006   NITRITE NEGATIVE 01/11/2017 1022   LEUKOCYTESUR NEGATIVE 01/11/2017 1022   Sepsis Labs Invalid input(s): PROCALCITONIN,  WBC,  LACTICIDVEN Microbiology No results found for this or any previous visit (from the past 240 hour(s)).   Time coordinating discharge: Over 30 minutes  SIGNED:   Debbe Odea, MD  [facility] 01/12/2017, 1:24 PM Pager   If 7PM-7AM, please contact night-coverage [email or web address] Password [email or web address]

## 2017-01-12 NOTE — Discharge Instructions (Signed)
Drink less than 1 L of water a day. Buy a scale an weigh yourself every day.  Cirrhosis Cirrhosis is long-term (chronic) liver injury. The liver is your largest internal organ, and it performs many functions. The liver converts food into energy, removes toxic material from your blood, makes important proteins, and absorbs necessary vitamins from your diet. If you have cirrhosis, it means many of your healthy liver cells have been replaced by scar tissue. This prevents blood from flowing through your liver, which makes it difficult for your liver to function. This scarring is not reversible, but treatment can prevent it from getting worse. What are the causes? Hepatitis C and long-term alcohol abuse are the most common causes of cirrhosis. Other causes include:  Nonalcoholic fatty liver disease.  Hepatitis B infection.  Autoimmune hepatitis.  Diseases that cause blockage of ducts inside the liver.  Inherited liver diseases.  Reactions to certain long-term medicines.  Parasitic infections.  Long-term exposure to certain toxins. What increases the risk? You may have a higher risk of cirrhosis if you:  Have certain hepatitis viruses.  Abuse alcohol, especially if you are male.  Are overweight.  Share needles.  Have unprotected sex with someone who has hepatitis. What are the signs or symptoms? You may not have any signs and symptoms at first. Symptoms may not develop until the damage to your liver starts to get worse. Signs and symptoms of cirrhosis may include:  Tenderness in the right-upper part of your abdomen.  Weakness and tiredness (fatigue).  Loss of appetite.  Nausea.  Weight loss and muscle loss.  Itchiness.  Yellow skin and eyes (jaundice).  Buildup of fluid in the abdomen (ascites).  Swelling of the feet and ankles (edema).  Appearance of tiny blood vessels under the skin.  Mental confusion.  Easy bruising and bleeding. How is this  diagnosed? Your health care provider may suspect cirrhosis based on your symptoms and medical history, especially if you have other medical conditions or a history of alcohol abuse. Your health care provider will do a physical exam to feel your liver and check for signs of cirrhosis. Your health care provider may perform other tests, including:  Blood tests to check:  Whether you have hepatitis B or C.  Kidney function.  Liver function.  Imaging tests such as:  MRI or CT scan to look for changes seen in advanced cirrhosis.  Ultrasound to see if normal liver tissue is being replaced by scar tissue.  A procedure using a long needle to take a sample of liver tissue (biopsy) for examination under a microscope. Liver biopsy can confirm the diagnosis of cirrhosis. How is this treated? Treatment depends on how damaged your liver is and what caused the damage. Treatment may include treating cirrhosis symptoms or treating the underlying causes of the condition to try to slow the progression of the damage. Treatment may include:  Making lifestyle changes, such as:  Eating a healthy diet.  Restricting salt intake.  Maintaining a healthy weight.  Not abusing drugs or alcohol.  Taking medicines to:  Treat liver infections or other infections.  Control itching.  Reduce fluid buildup.  Reduce certain blood toxins.  Reduce risk of bleeding from enlarged blood vessels in the stomach or esophagus (varices).  If varices are causing bleeding problems, you may need treatment with a procedure that ties up the vessels causing them to fall off (band ligation).  If cirrhosis is causing your liver to fail, your health care provider may  recommend a liver transplant.  Other treatments may be recommended depending on any complications of cirrhosis, such as liver-related kidney failure (hepatorenal syndrome). Follow these instructions at home:  Take medicines only as directed by your health care  provider. Do not use drugs that are toxic to your liver. Ask your health care provider before taking any new medicines, including over-the-counter medicines.  Rest as needed.  Eat a well-balanced diet. Ask your health care provider or dietitian for more information.  You may have to follow a low-salt diet or restrict your water intake as directed.  Do not drink alcohol. This is especially important if you are taking acetaminophen.  Keep all follow-up visits as directed by your health care provider. This is important. Contact a health care provider if:  You have fatigue or weakness that is getting worse.  You develop swelling of the hands, feet, legs, or face.  You have a fever.  You develop loss of appetite.  You have nausea or vomiting.  You develop jaundice.  You develop easy bruising or bleeding. Get help right away if:  You vomit bright red blood or a material that looks like coffee grounds.  You have blood in your stools.  Your stools appear black and tarry.  You become confused.  You have chest pain or trouble breathing. This information is not intended to replace advice given to you by your health care provider. Make sure you discuss any questions you have with your health care provider. Document Released: 10/04/2005 Document Revised: 02/12/2016 Document Reviewed: 06/12/2014 Elsevier Interactive Patient Education  2017 [employer].

## 2017-01-21 ENCOUNTER — Other Ambulatory Visit (INDEPENDENT_AMBULATORY_CARE_PROVIDER_SITE_OTHER): Payer: Medicare HMO

## 2017-01-21 ENCOUNTER — Encounter: Payer: Self-pay | Admitting: Nurse Practitioner

## 2017-01-21 ENCOUNTER — Ambulatory Visit (INDEPENDENT_AMBULATORY_CARE_PROVIDER_SITE_OTHER): Payer: Medicare HMO | Admitting: Nurse Practitioner

## 2017-01-21 ENCOUNTER — Encounter (INDEPENDENT_AMBULATORY_CARE_PROVIDER_SITE_OTHER): Payer: Self-pay

## 2017-01-21 VITALS — BP 114/74 | HR 64 | Ht 66.0 in | Wt 136.4 lb

## 2017-01-21 DIAGNOSIS — E876 Hypokalemia: Secondary | ICD-10-CM

## 2017-01-21 DIAGNOSIS — K7031 Alcoholic cirrhosis of liver with ascites: Secondary | ICD-10-CM | POA: Diagnosis not present

## 2017-01-21 LAB — BASIC METABOLIC PANEL
BUN: 13 mg/dL (ref 6–23)
CALCIUM: 7.9 mg/dL — AB (ref 8.4–10.5)
CHLORIDE: 111 meq/L (ref 96–112)
CO2: 24 meq/L (ref 19–32)
CREATININE: 1.16 mg/dL (ref 0.40–1.50)
GFR: 81.07 mL/min (ref >60.00)
GLUCOSE: 108 mg/dL — AB (ref 70–99)
Potassium: 3.5 mEq/L (ref 3.5–5.1)
SODIUM: 141 meq/L (ref 135–145)

## 2017-01-21 LAB — MAGNESIUM: Magnesium: 1.9 mg/dL (ref 1.5–2.5)

## 2017-01-21 NOTE — Patient Instructions (Signed)
You are scheduled to follow up with Dr [name] in 2 months.  Your physician has requested that you go to the basement for the following lab work before leaving today: BMET, Magnesium.  We will call you with results or you can sign up for MyChart.

## 2017-01-21 NOTE — Progress Notes (Addendum)
HPI: Patient is a 66 year old male known to Dr. Hilarie Nelson for history of alcoholic / HCV cirrhosis and [place] s/p microwave ablation in March 2017. No alcohol in 5 months. He underwent banding of varices late Feb. Admitted two weeks later with  upper GI bleed. Repeat EGD revealed bleeding at site of previous banding where there was an ulcer and ? Varix. More bands placed and bleeding controlled. Discharged home two days later.   Patient represented to the emergency department late March with complaints of swelling of his abdomen and legs. He been drinking a lot of water as well as 2 bottles of Gatorade a day. He underwent paracentesis with removal of 5.7 liters of fluid. His Lasix was increased from 40 twice a day to 80 twice a day. Patient was discharged home on a fluid restriction of less than 1 L a day. His Aldactone was continued at 3 times a day. Patient advised by Hospitalist to follow up with [place].   Peter Nelson feels okay. He has been compliant with daily weights, lost 6 pounds over the last week. He is consuming just under a liter of fluid a day and following a low sodium diet. He has inadvertently been taking Aldactone BID instead on TID. No abdominal pain or swelling. No vomiting. No black stools.   Past Medical History:  Diagnosis Date  . Alcohol abuse   . Allergy   . Arthritis   . Asthma   . Cancer ([place])    liver  . Cirrhosis ([place])   . Hepatitis C   . Hepatocellular carcinoma ([place])   . Hyperplastic colon polyp   . Internal hemorrhoids   . Shortness of breath dyspnea    with activity and anxiety  . Wears glasses     Patient's surgical history, family medical history, social history, medications and allergies were all reviewed in Epic    Physical Exam: BP 114/74   Pulse 64   Ht 5' 6"  (1.676 m)   Wt 136 lb 6.4 oz (61.9 kg)   SpO2 98%   BMI 22.02 kg/m   GENERAL: thin black male in NAD PSYCH: :Pleasant, cooperative, normal affect EENT:  conjunctiva pink, mucous membranes  moist, neck supple without masses CARDIAC:  RRR,  1+ bilateral peripheral edema PULM: Normal respiratory effort, lungs CTA bilaterally, no wheezing ABDOMEN:  soft, nontender, nondistended, no obvious masses,  normal bowel sounds SKIN:  turgor, no lesions seen Musculoskeletal:  Temporal wasting NEURO: Alert and oriented x 3, no focal neurologic deficits    ASSESSMENT and PLAN:  1. 66 yo male with ETOH / HCV cirrhosis complicated by [facility] s/p ablation. He is s/p recent banding of esophageal varices (Feb and mid March) and s/p LVP late March while hospitalized with edema and ascites. He is being compliant with low salt diet and diuretics. No significant edema or ascites today. Weight down 6 pounds over the last week. Renal function is normal.  No ETOH in months. His HCV was treated by ID. HCV quant negative in February.  -Repeat BMET today and adjust diuretic dose if needed.  -continue daily weights, low salt diet -Will call with lab results, I think we will be able to liberalize fluids.  -continue PPI (post banding) -follow up with Dr. Hilarie Nelson in 6-8 weeks.  -Will ask Dr. Hilarie Nelson to review recent EGD. Patient will need follow up EGD, +/- banding at some point.   2. Hypokalemia.  -repeat labs today, including Mg+ . Will adjust diuretics when  labs return.     Peter Nelson , NP 01/21/2017, 11:43 AM  Addendum: Reviewed and agree with management. Peter Bears, MD

## 2017-01-24 ENCOUNTER — Other Ambulatory Visit: Payer: Self-pay

## 2017-01-24 DIAGNOSIS — R188 Other ascites: Principal | ICD-10-CM

## 2017-01-24 DIAGNOSIS — K7460 Unspecified cirrhosis of liver: Secondary | ICD-10-CM

## 2017-02-03 ENCOUNTER — Other Ambulatory Visit (INDEPENDENT_AMBULATORY_CARE_PROVIDER_SITE_OTHER): Payer: Medicare HMO

## 2017-02-03 DIAGNOSIS — K7460 Unspecified cirrhosis of liver: Secondary | ICD-10-CM | POA: Diagnosis not present

## 2017-02-03 DIAGNOSIS — R188 Other ascites: Principal | ICD-10-CM

## 2017-02-03 LAB — BASIC METABOLIC PANEL
BUN: 12 mg/dL (ref 6–23)
CALCIUM: 8.4 mg/dL (ref 8.4–10.5)
CO2: 26 meq/L (ref 19–32)
CREATININE: 1.06 mg/dL (ref 0.40–1.50)
Chloride: 108 mEq/L (ref 96–112)
GFR: 89.95 mL/min (ref >60.00)
GLUCOSE: 129 mg/dL — AB (ref 70–99)
POTASSIUM: 4.1 meq/L (ref 3.5–5.1)
SODIUM: 138 meq/L (ref 135–145)

## 2017-02-04 ENCOUNTER — Other Ambulatory Visit: Payer: Commercial Managed Care - HMO

## 2017-02-07 ENCOUNTER — Other Ambulatory Visit: Payer: Self-pay

## 2017-02-07 MED ORDER — NADOLOL 20 MG PO TABS: 20.0000 mg | ORAL_TABLET | Freq: Every day | ORAL | 1 refills | Status: DC

## 2017-02-07 MED ORDER — FUROSEMIDE 40 MG PO TABS: 80.0000 mg | ORAL_TABLET | Freq: Two times a day (BID) | ORAL | 1 refills | Status: DC

## 2017-02-07 MED ORDER — FOLIC ACID 1 MG PO TABS: 1.0000 mg | ORAL_TABLET | Freq: Every day | ORAL | 1 refills | Status: AC

## 2017-02-07 MED ORDER — PANTOPRAZOLE SODIUM 40 MG PO TBEC: 40.0000 mg | DELAYED_RELEASE_TABLET | Freq: Two times a day (BID) | ORAL | 1 refills | Status: DC

## 2017-02-07 MED ORDER — VITAMIN B-12 1000 MCG PO TABS: 1000.0000 ug | ORAL_TABLET | Freq: Every day | ORAL | 1 refills | Status: AC

## 2017-02-07 MED ORDER — SPIRONOLACTONE 50 MG PO TABS: 50.0000 mg | ORAL_TABLET | Freq: Three times a day (TID) | ORAL | 1 refills | Status: DC

## 2017-02-07 MED ORDER — SPIRONOLACTONE 50 MG PO TABS: 50.0000 mg | ORAL_TABLET | Freq: Two times a day (BID) | ORAL | 1 refills | Status: DC

## 2017-02-11 ENCOUNTER — Other Ambulatory Visit: Payer: Self-pay

## 2017-02-11 ENCOUNTER — Ambulatory Visit: Payer: Commercial Managed Care - HMO | Admitting: Hematology

## 2017-02-11 ENCOUNTER — Other Ambulatory Visit: Payer: Commercial Managed Care - HMO

## 2017-02-11 DIAGNOSIS — I8501 Esophageal varices with bleeding: Secondary | ICD-10-CM

## 2017-02-21 ENCOUNTER — Ambulatory Visit (INDEPENDENT_AMBULATORY_CARE_PROVIDER_SITE_OTHER): Payer: Medicare HMO | Admitting: Internal Medicine

## 2017-02-21 ENCOUNTER — Encounter: Payer: Self-pay | Admitting: Internal Medicine

## 2017-02-21 VITALS — BP 110/62 | HR 80 | Ht 66.0 in | Wt 122.0 lb

## 2017-02-21 DIAGNOSIS — I8510 Secondary esophageal varices without bleeding: Secondary | ICD-10-CM | POA: Diagnosis not present

## 2017-02-21 DIAGNOSIS — Z87898 Personal history of other specified conditions: Secondary | ICD-10-CM | POA: Diagnosis not present

## 2017-02-21 DIAGNOSIS — K7460 Unspecified cirrhosis of liver: Secondary | ICD-10-CM | POA: Diagnosis not present

## 2017-02-21 DIAGNOSIS — C220 Liver cell carcinoma: Secondary | ICD-10-CM

## 2017-02-21 DIAGNOSIS — F1011 Alcohol abuse, in remission: Secondary | ICD-10-CM

## 2017-02-21 DIAGNOSIS — K766 Portal hypertension: Secondary | ICD-10-CM | POA: Diagnosis not present

## 2017-02-21 MED ORDER — FUROSEMIDE 40 MG PO TABS: 40.0000 mg | ORAL_TABLET | Freq: Every day | ORAL | 0 refills | Status: DC

## 2017-02-21 MED ORDER — NADOLOL 40 MG PO TABS: 40.0000 mg | ORAL_TABLET | Freq: Two times a day (BID) | ORAL | 2 refills | Status: DC

## 2017-02-21 NOTE — Patient Instructions (Addendum)
Please decrease your lasix to 40 mg once daily.  Increase your nadolol to 40 mg once dily.  Continue aldactone 100 mg (take 2 tablets every morning)   Continue pantoprazole 40 mg twice daily.  Follow a low sodium diet.  Continue to avoid alcohol.  Follow up with Dr [name] in 3 months.  You have been scheduled for an MRI at [facility] on Monday, 02/28/17. Your appointment time is 9:00 am. Please arrive 15 minutes prior to your appointment time for registration purposes. Please make certain not to have anything to eat or drink 6 hours prior to your test. In addition, if you have any metal in your body, have a pacemaker or defibrillator, please be sure to let your ordering physician know. This test typically takes 45 minutes to 1 hour to complete. Should you need to reschedule, please call [phone] to do so.  If you are age 72 or older, your body mass index should be between 23-30. Your Body mass index is 19.69 kg/m. If this is out of the aforementioned range listed, please consider follow up with your Primary Care Provider.  If you are age 56 or younger, your body mass index should be between 19-25. Your Body mass index is 19.69 kg/m. If this is out of the aformentioned range listed, please consider follow up with your Primary Care Provider.

## 2017-02-21 NOTE — Progress Notes (Signed)
Subjective:    Patient ID: Peter Nelson, male    DOB: May 13, 1951, 66 y.o.   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  He was hospitalized in March 2018 with melena and posthemorrhagic anemia. Dr. Carlean Purl perform an upper endoscopy At which time there was active bleeding from the distal esophageal varix. This was in the vicinity of a post banding ulceration but on inspection this appeared to be recurrent variceal hemorrhage. Additional band ligation was performed at this time  He has been having issues with abdominal swelling and lower extremity edema. He has been treated with diuretics.  Today he reports he is feeling very well. He reports complete resolution of his abdominal ascites and lower extremity edema. He denies abdominal pain. Energy levels have been improving. No issues with nausea, vomiting, diarrhea or constipation. He denies bleeding per rectum and melena. He has been alcohol abstinent for 5 months.  He has been using Lasix 80 mg twice a day and spironolactone 50 mg 3 times a day. He is on nadolol 20 mg daily along with Q76 and folic acid.  Review of Systems As per history of present illness, otherwise negative  Current Medications, Allergies, Past Medical History, Past Surgical History, Family History and Social History were reviewed in [employer] record.     Objective:   Physical Exam BP 110/62   Pulse 80   Ht 5' 6"  (1.676 m)   Wt 122 lb (55.3 kg)   BMI 19.69 kg/m  Constitutional: Well-developed and Thin chronically ill-appearing (though appears overall much better than the last time I saw him) No distress. HEENT: Normocephalic and atraumatic. Oropharynx is clear and  moist. Conjunctivae are normal.  No scleral icterus. Neck: Neck supple. Trachea midline. Cardiovascular: Normal rate, regular rhythm and intact distal pulses. No M/R/G Pulmonary/chest: Effort normal and breath sounds normal. No wheezing, rales or rhonchi. Abdominal: Soft, nontender, nondistended. No ascites, abdomen is flat, Bowel sounds active throughout. There are no masses palpable.  Extremities: no clubbing, cyanosis, or edema Neurological: Alert and oriented to person place and time. No asterixis Skin: Skin is warm and dry. Psychiatric: Normal mood and affect. Behavior is normal.  CMP     Component Value Date/Time   NA 138 02/03/2017 0924   NA 141 11/12/2016 1440   K 4.1 02/03/2017 0924   K 3.5 11/12/2016 1440   CL 108 02/03/2017 0924   CO2 26 02/03/2017 0924   CO2 27 11/12/2016 1440   GLUCOSE 129 (H) 02/03/2017 0924   GLUCOSE 75 11/12/2016 1440   BUN 12 02/03/2017 0924   BUN 18.1 11/12/2016 1440   CREATININE 1.06 02/03/2017 0924   CREATININE 1.2 11/12/2016 1440   CALCIUM 8.4 02/03/2017 0924   CALCIUM 8.2 (L) 11/12/2016 1440   PROT 6.2 (L) 01/12/2017 0539   PROT 7.3 11/12/2016 1440   ALBUMIN 2.2 (L) 01/12/2017 0539   ALBUMIN 2.2 (L) 11/12/2016 1440   AST 58 (H) 01/12/2017 0539   AST 66 (H) 11/12/2016 1440   ALT 27 01/12/2017 0539   ALT 32 11/12/2016 1440   ALKPHOS 95 01/12/2017 0539   ALKPHOS 144 11/12/2016 1440   BILITOT 1.7 (H) 01/12/2017 0539   BILITOT 2.61 (H) 11/12/2016 1440   GFRNONAA >  60 01/12/2017 1416   GFRNONAA >89 01/22/2016 0841   GFRAA >60 01/12/2017 1416   GFRAA >89 01/22/2016 0841   Lab Results  Component Value Date   INR 1.50 01/12/2017   INR 1.46 01/11/2017   INR 1.50 12/29/2016   PROTIME 15.6 (H) 11/12/2016   PROTIME 15.6 (H) 07/13/2016   PROTIME 13.2 11/13/2015   CBC    Component Value Date/Time   WBC 6.9 01/12/2017 0539   RBC 2.64 (L) 01/12/2017 0539   HGB 8.4 (L) 01/12/2017 0539   HGB 10.1 (L) 11/12/2016 1440   HCT 24.6 (L)  01/12/2017 0539   HCT 30.0 (L) 11/12/2016 1440   PLT 145 (L) 01/12/2017 0539   PLT 165 11/12/2016 1440   MCV 93.2 01/12/2017 0539   MCV 106.1 (H) 11/12/2016 1440   MCH 31.8 01/12/2017 0539   MCHC 34.1 01/12/2017 0539   RDW 16.8 (H) 01/12/2017 0539   RDW 15.6 (H) 11/12/2016 1440   LYMPHSABS 1.7 01/11/2017 0848   LYMPHSABS 1.7 11/12/2016 1440   MONOABS 0.7 01/11/2017 0848   MONOABS 0.9 11/12/2016 1440   EOSABS 0.5 01/11/2017 0848   EOSABS 0.3 11/12/2016 1440   BASOSABS 0.1 01/11/2017 0848   BASOSABS 0.1 11/12/2016 1440         Assessment & Plan:  66 year old male with a past medical history of 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  1. Hepatitis C plus alcoholic cirrhosis with portal hypertension -- he has improved significantly over the last 6 months though, had recurrent variceal hemorrhage in March 2018. He seems to have recovered well. He has no evidence of ascites or lower extremity edema. He has remained alcohol abstinence --Check CBC, CMP and INR today --Variceal bleeding -- repeat EGD to confirm eradication of varices scheduled in June with monitored anesthesia care. He is aware of this appointment. Increase nadolol to 40 mg daily targeting resting heart rate of 60 bpm (resting heart rate at 72 bpm today) --Ascites/lower extremity edema -- complete resolution. I will back off of diuretics today as to not cause renal insufficiency given improvement. Reduce Lasix to 40 mg once daily, continue Aldactone 100 mg daily (he was advised he could take this as a single dose daily). He is asked to notify me immediately should he develop recurrent abdominal distention or lower extremity edema. He voices understanding --Low sodium diet --Continue strict alcohol avoidance --History of hepatocellular  carcinoma status post microwave ablation -- repeat MRI abdomen HCC protocol ordered, last exam was 6 months ago. Office follow-up in 3 months, sooner if necessary  25 minutes spent with the patient today. Greater than 50% was spent in counseling and coordination of care with the patient

## 2017-02-22 ENCOUNTER — Telehealth: Payer: Self-pay | Admitting: *Deleted

## 2017-02-22 DIAGNOSIS — K7460 Unspecified cirrhosis of liver: Secondary | ICD-10-CM

## 2017-02-22 NOTE — Telephone Encounter (Signed)
Called patient to advise that I am cancelling the appointment he has scheduled currently to see Dr [name] on 03/24/17 as he was just seen and was advised he can return in 3 months. The June appointment was an old appointment. Patient's girlfriend, Levada Dy was advised of this information as patient was unavailable (ROI gives permission to speak to [place]). Levada Dy was also reminded of patient's MRI Monday and that patient needs to have his labwork completed. She verbalizes understanding of the information given.

## 2017-02-28 ENCOUNTER — Ambulatory Visit (HOSPITAL_COMMUNITY)
Admission: RE | Admit: 2017-02-28 | Discharge: 2017-02-28 | Disposition: A | Discharge status: Home / Self Care | Payer: Medicare HMO | Source: Ambulatory Visit | Attending: Internal Medicine | Admitting: Internal Medicine

## 2017-02-28 DIAGNOSIS — K7460 Unspecified cirrhosis of liver: Secondary | ICD-10-CM | POA: Diagnosis not present

## 2017-02-28 DIAGNOSIS — R161 Splenomegaly, not elsewhere classified: Secondary | ICD-10-CM | POA: Insufficient documentation

## 2017-02-28 DIAGNOSIS — R188 Other ascites: Secondary | ICD-10-CM | POA: Diagnosis not present

## 2017-02-28 DIAGNOSIS — K766 Portal hypertension: Secondary | ICD-10-CM | POA: Insufficient documentation

## 2017-02-28 MED ORDER — GADOXETATE DISODIUM 0.25 MMOL/ML IV SOLN
5.0000 mL | Freq: Once | INTRAVENOUS | Status: AC | PRN
  Administered 2017-02-28: 5 mL via INTRAVENOUS

## 2017-03-21 ENCOUNTER — Ambulatory Visit (INDEPENDENT_AMBULATORY_CARE_PROVIDER_SITE_OTHER): Payer: Medicare HMO | Admitting: Family Medicine

## 2017-03-21 ENCOUNTER — Encounter: Payer: Self-pay | Admitting: Family Medicine

## 2017-03-21 VITALS — BP 151/71 | HR 102 | Temp 98.2°F | Resp 15 | Ht 66.0 in | Wt 132.2 lb

## 2017-03-21 DIAGNOSIS — Z131 Encounter for screening for diabetes mellitus: Secondary | ICD-10-CM | POA: Diagnosis not present

## 2017-03-21 DIAGNOSIS — R634 Abnormal weight loss: Secondary | ICD-10-CM | POA: Diagnosis not present

## 2017-03-21 DIAGNOSIS — N3001 Acute cystitis with hematuria: Secondary | ICD-10-CM | POA: Diagnosis not present

## 2017-03-21 DIAGNOSIS — B182 Chronic viral hepatitis C: Secondary | ICD-10-CM

## 2017-03-21 DIAGNOSIS — Z125 Encounter for screening for malignant neoplasm of prostate: Secondary | ICD-10-CM | POA: Diagnosis not present

## 2017-03-21 LAB — CBC WITH DIFFERENTIAL/PLATELET
Basophils Absolute: 46 cells/uL (ref 0–200)
Basophils Relative: 1 %
EOS ABS: 414 {cells}/uL (ref 15–500)
Eosinophils Relative: 9 %
HEMATOCRIT: 30.3 % — AB (ref 38.5–50.0)
Hemoglobin: 9.9 g/dL — ABNORMAL LOW (ref 13.2–17.1)
Lymphocytes Relative: 35 %
Lymphs Abs: 1610 cells/uL (ref 850–3900)
MCH: 31 pg (ref 27.0–33.0)
MCHC: 32.7 g/dL (ref 32.0–36.0)
MCV: 95 fL (ref 80.0–100.0)
MONO ABS: 598 {cells}/uL (ref 200–950)
MPV: 8.6 fL (ref 7.5–12.5)
Monocytes Relative: 13 %
NEUTROS ABS: 1932 {cells}/uL (ref 1500–7800)
Neutrophils Relative %: 42 %
PLATELETS: 97 10*3/uL — AB (ref 140–400)
RBC: 3.19 MIL/uL — ABNORMAL LOW (ref 4.20–5.80)
RDW: 16.1 % — ABNORMAL HIGH (ref 11.0–15.0)
WBC: 4.6 10*3/uL (ref 3.8–10.8)

## 2017-03-21 LAB — POCT URINALYSIS DIP (DEVICE)
GLUCOSE, UA: NEGATIVE mg/dL
Ketones, ur: NEGATIVE mg/dL
Nitrite: POSITIVE — AB
PROTEIN: NEGATIVE mg/dL
Specific Gravity, Urine: 1.025 (ref 1.005–1.030)
Urobilinogen, UA: 2 mg/dL — ABNORMAL HIGH (ref 0.0–1.0)
pH: 5.5 (ref 5.0–8.0)

## 2017-03-21 MED ORDER — CIPROFLOXACIN HCL 500 MG PO TABS: 500.0000 mg | ORAL_TABLET | Freq: Two times a day (BID) | ORAL | 0 refills | Status: DC

## 2017-03-21 NOTE — Patient Instructions (Signed)
Take Ciprofloxacin 500 mg x 10 days and complete all medication.  Return for follow-up in 6 months or sooner as needed.  Keep all gastroenterology follow-ups as scheduled.    Acute Urinary Retention, Male Acute urinary retention is the temporary inability to urinate. This is a common problem in older men. As men age their prostates become larger and block the flow of urine from the bladder. This is usually a problem that has come on gradually. Follow these instructions at home: If you are sent home with a Foley catheter and a drainage system, you will need to discuss the best course of action with your health care provider. While the catheter is in, maintain a good intake of fluids. Keep the drainage bag emptied and lower than your catheter. This is so that contaminated urine will not flow back into your bladder, which could lead to a urinary tract infection. There are two main types of drainage bags. One is a large bag that usually is used at night. It has a good capacity that will allow you to sleep through the night without having to empty it. The second type is called a leg bag. It has a smaller capacity, so it needs to be emptied more frequently. However, the main advantage is that it can be attached by a leg strap and can go underneath your clothing, allowing you the freedom to move about or leave your home. Only take over-the-counter or prescription medicines for pain, discomfort, or fever as directed by your health care provider. Contact a health care provider if:  You develop a low-grade fever.  You experience spasms or leakage of urine with the spasms. Get help right away if:  You develop chills or fever.  Your catheter stops draining urine.  Your catheter falls out.  You start to develop increased bleeding that does not respond to rest and increased fluid intake. This information is not intended to replace advice given to you by your health care provider. Make sure you discuss  any questions you have with your health care provider. Document Released: 01/10/2001 Document Revised: 03/17/2016 Document Reviewed: 03/15/2013 Elsevier Interactive Patient Education  2017 [employer].

## 2017-03-21 NOTE — Progress Notes (Signed)
Patient ID: Peter Nelson, male    DOB: [birth year], 66 y.o.   MRN: [id number]  PCP: [name], FNP  Chief Complaint  Patient presents with  . Follow-up     Subjective:  HPI Peter Nelson is a 66 y.o. male presents for routine 6 month follow-up.  Medical problems include: Syncope, Hepatocellular carcinoma, BPH, Alcohol Abuse, ED,  Upper GI Bleed.  [name] is currently under the care Dr. Truitt Merle (oncology) and Dr. [name] (gastroenterology). Last hospitalization 10/25/2016 for a total of 5 days for an upper bleed related to liver disease. He reports that his condition has remained improved and stable since hospitalization. He has consistently followed up with his care team. In addition, he has abstained from alcohol use since January, with one relapse last weekend during his son's wedding. Reports that he is trying to quit smoking now. He has reduced his cigarettes habit to 5-6 cigarettes per day. Last primary care follow-up was 09/2016.   Social History   Social History  . Marital status: Married    Spouse name: [name]  . Number of children: 43  . Years of education: [name]   Occupational History  . Not on file.   Social History Main Topics  . Smoking status: Current Every Day Smoker    Packs/day: 0.25    Years: 55.00    Types: Cigarettes  . Smokeless tobacco: Never Used     Comment: cutting back  . Alcohol use No     Comment: "a 1/2 pint a day" for more than 30 years, average 1 pint a day, cut back lately   . Drug use: No  . Sexual activity: Yes    Partners: Female   Other Topics Concern  . Not on file   Social History Narrative   Married Dec 22nd, 2016 to [name]   Has #13 children    Family History  Problem Relation Age of Onset  . Hypertension Mother   . Diabetes Mother   . Heart disease Mother   . Alzheimer's disease Mother   . Diabetes Sister   . Diabetes Father   . Cancer Brother        Liver  . Colon cancer Neg Hx   . Esophageal cancer Neg  Hx   . Stomach cancer Neg Hx   . Rectal cancer Neg Hx    Review of Systems   See HPI  Patient Active Problem List   Diagnosis Date Noted  . Acute hypokalemia 01/12/2017  . Acute upper GI bleeding 12/29/2016  . Esophageal varices in alcoholic cirrhosis ([place])   . Ascites 2020-06-1517  . Hemorrhage of esophageal varices in alcoholic cirrhosis ([place])   . Upper GI bleeding   . Acute blood loss anemia 10/25/2016  . Enlarged prostate on rectal examination 10/01/2016  . Erectile dysfunction 10/01/2016  . Abscess of right lower leg 03/15/2016  . Eczema 01/26/2016  . Hepatocellular carcinoma ([place]) 11/13/2015  . Memory loss 11/10/2015  . Alcoholic cirrhosis of liver ([place]) 11/10/2015  . Hepatitis, viral 11/10/2015  . Chronic hepatitis C without hepatic coma ([place]) 09/17/2015  . Stress at home 08/26/2015  . Alcohol abuse 07/28/2015  . Tobacco use disorder 07/28/2015  . Syncope 05/08/2015    Allergies  Allergen Reactions  . Aspirin Other (See Comments)    Does not take because of hep c    Prior to Admission medications   Medication Sig Start Date End Date Taking? Authorizing Provider  furosemide (LASIX) 40 MG  tablet Take 1 tablet (40 mg total) by mouth daily. 02/21/17 03/23/17  Pyrtle, Lajuan Lines, MD  nadolol (CORGARD) 40 MG tablet Take 1 tablet (40 mg total) by mouth 2 (two) times daily. 02/21/17   Pyrtle, Lajuan Lines, MD  pantoprazole (PROTONIX) 40 MG tablet Take 1 tablet (40 mg total) by mouth 2 (two) times daily. 02/07/17   Pyrtle, Lajuan Lines, MD  potassium chloride SA (K-DUR,KLOR-CON) 20 MEQ tablet Take 2 tablets (40 mEq total) by mouth 2 (two) times daily. 01/12/17   Debbe Odea, MD  spironolactone (ALDACTONE) 50 MG tablet Take 1 tablet (50 mg total) by mouth 3 (three) times daily. 02/07/17 03/09/17  Pyrtle, Lajuan Lines, MD    Past Medical, Surgical Family and Social History reviewed and updated.    Objective:   Today's Vitals   03/21/17 1120  BP: (!) 151/71  Pulse: (!) 102  Resp: 15  Temp: 98.2 F  (36.8 C)  TempSrc: Oral  SpO2: 98%  Weight: 132 lb 3.2 oz (60 kg)  Height: 5' 6"  (1.676 m)    Wt Readings from Last 3 Encounters:  02/21/17 122 lb (55.3 kg)  01/21/17 136 lb 6.4 oz (61.9 kg)  01/11/17 143 lb 1.3 oz (64.9 kg)    Physical Exam  Constitutional: He is oriented to person, place, and time. He appears well-developed. He appears cachectic.  HENT:  Head: Normocephalic and atraumatic.  Eyes: Conjunctivae are normal. Pupils are equal, round, and reactive to light.  Neck: Normal range of motion. Neck supple.  Cardiovascular: Normal rate, normal heart sounds and intact distal pulses.   Pulmonary/Chest: Effort normal.  Abdominal: Soft. Bowel sounds are normal. He exhibits no shifting dullness, no distension and no fluid wave. There is no tenderness. There is no CVA tenderness.  Very thin appearance   Musculoskeletal: Normal range of motion.  Neurological: He is alert and oriented to person, place, and time. He has normal reflexes.  Psychiatric: He has a normal mood and affect. His behavior is normal.     Assessment & Plan:  1. Screening for diabetes mellitus - COMPLETE METABOLIC PANEL WITH GFR - Hemoglobin A1c  2. Acute cystitis with hematuria, positive nitrates and leukocytosis on UA - Urine culture -Take Ciprofloxacin 500 mg x 10 days and complete all medication.  3. Weight loss - Thyroid Panel With TSH  4. Chronic hepatitis C without hepatic coma (HCC) - CBC with Differential  5. Prostate cancer screening - PSA   Return for follow-up in 6 months or sooner as needed.  Keep all gastroenterology follow-ups as scheduled.   [name]. [name], MSN, FNP-C [facility]  [address] [place], [place] [address] [phone]

## 2017-03-22 LAB — PSA: PSA: 0.2 ng/mL (ref <=4.0)

## 2017-03-22 LAB — COMPLETE METABOLIC PANEL WITH GFR
ALT: 27 U/L (ref 9–46)
AST: 65 U/L — AB (ref 10–35)
Albumin: 2.9 g/dL — ABNORMAL LOW (ref 3.6–5.1)
Alkaline Phosphatase: 116 U/L — ABNORMAL HIGH (ref 40–115)
BUN: 13 mg/dL (ref 7–25)
CHLORIDE: 113 mmol/L — AB (ref 98–110)
CO2: 20 mmol/L (ref 20–31)
CREATININE: 0.88 mg/dL (ref 0.70–1.25)
Calcium: 8.4 mg/dL — ABNORMAL LOW (ref 8.6–10.3)
GFR, Est Non African American: >89 mL/min (ref >=60)
Glucose, Bld: 65 mg/dL (ref 65–99)
Potassium: 3.5 mmol/L (ref 3.5–5.3)
Sodium: 141 mmol/L (ref 135–146)
Total Bilirubin: 2.2 mg/dL — ABNORMAL HIGH (ref 0.2–1.2)
Total Protein: 7 g/dL (ref 6.1–8.1)

## 2017-03-22 LAB — THYROID PANEL WITH TSH
FREE THYROXINE INDEX: 1.4 (ref 1.4–3.8)
T3 UPTAKE: 34 % (ref 22–35)
T4, Total: 4.1 ug/dL — ABNORMAL LOW (ref 4.5–12.0)
TSH: 1.33 m[IU]/L (ref 0.40–4.50)

## 2017-03-22 LAB — HEMOGLOBIN A1C
Hgb A1c MFr Bld: 5.1 % (ref <5.7)
Mean Plasma Glucose: 100 mg/dL

## 2017-03-24 ENCOUNTER — Ambulatory Visit: Payer: Medicare HMO | Admitting: Internal Medicine

## 2017-03-24 LAB — URINE CULTURE

## 2017-04-08 ENCOUNTER — Emergency Department (HOSPITAL_COMMUNITY)
Admission: EM | Admit: 2017-04-08 | Discharge: 2017-04-09 | Disposition: A | Discharge status: Home / Self Care | Payer: Medicare HMO | Attending: Emergency Medicine | Admitting: Emergency Medicine

## 2017-04-08 ENCOUNTER — Encounter (HOSPITAL_COMMUNITY): Payer: Self-pay | Admitting: Nurse Practitioner

## 2017-04-08 DIAGNOSIS — J45909 Unspecified asthma, uncomplicated: Secondary | ICD-10-CM | POA: Diagnosis not present

## 2017-04-08 DIAGNOSIS — F17210 Nicotine dependence, cigarettes, uncomplicated: Secondary | ICD-10-CM | POA: Insufficient documentation

## 2017-04-08 DIAGNOSIS — R339 Retention of urine, unspecified: Secondary | ICD-10-CM | POA: Diagnosis not present

## 2017-04-08 DIAGNOSIS — T83038A Leakage of other indwelling urethral catheter, initial encounter: Secondary | ICD-10-CM | POA: Diagnosis not present

## 2017-04-08 DIAGNOSIS — Z8505 Personal history of malignant neoplasm of liver: Secondary | ICD-10-CM | POA: Diagnosis not present

## 2017-04-08 DIAGNOSIS — T83091A Other mechanical complication of indwelling urethral catheter, initial encounter: Secondary | ICD-10-CM | POA: Diagnosis not present

## 2017-04-08 DIAGNOSIS — Z79899 Other long term (current) drug therapy: Secondary | ICD-10-CM | POA: Insufficient documentation

## 2017-04-08 LAB — URINALYSIS, ROUTINE W REFLEX MICROSCOPIC
BILIRUBIN URINE: NEGATIVE
GLUCOSE, UA: NEGATIVE mg/dL
KETONES UR: NEGATIVE mg/dL
Leukocytes, UA: NEGATIVE
NITRITE: NEGATIVE
PROTEIN: NEGATIVE mg/dL
Specific Gravity, Urine: 1.011 (ref 1.005–1.030)
Squamous Epithelial / LPF: NONE SEEN
pH: 7 (ref 5.0–8.0)

## 2017-04-08 MED ORDER — TAMSULOSIN HCL 0.4 MG PO CAPS: 0.4000 mg | ORAL_CAPSULE | Freq: Every day | ORAL | 0 refills | Status: DC

## 2017-04-08 NOTE — ED Triage Notes (Signed)
Pt states "he cannot pee." Onste of this he reports an hour ago when after he took a fluid pill, he had urgency to pee but noticed that he was unable to urinate.Denies in GU hx including enlarged prostrate.
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
## 2017-04-09 ENCOUNTER — Encounter (HOSPITAL_COMMUNITY): Payer: Self-pay

## 2017-04-09 ENCOUNTER — Emergency Department (HOSPITAL_COMMUNITY)
Admission: EM | Admit: 2017-04-09 | Discharge: 2017-04-09 | Disposition: A | Discharge status: Home / Self Care | Payer: Medicare HMO | Source: Home / Self Care | Attending: Emergency Medicine | Admitting: Emergency Medicine

## 2017-04-09 ENCOUNTER — Encounter (HOSPITAL_COMMUNITY): Payer: Self-pay | Admitting: Emergency Medicine

## 2017-04-09 DIAGNOSIS — J45909 Unspecified asthma, uncomplicated: Secondary | ICD-10-CM

## 2017-04-09 DIAGNOSIS — T83098D Other mechanical complication of other indwelling urethral catheter, subsequent encounter: Secondary | ICD-10-CM | POA: Insufficient documentation

## 2017-04-09 DIAGNOSIS — Z79899 Other long term (current) drug therapy: Secondary | ICD-10-CM | POA: Insufficient documentation

## 2017-04-09 DIAGNOSIS — T839XXD Unspecified complication of genitourinary prosthetic device, implant and graft, subsequent encounter: Secondary | ICD-10-CM

## 2017-04-09 DIAGNOSIS — F17210 Nicotine dependence, cigarettes, uncomplicated: Secondary | ICD-10-CM

## 2017-04-09 DIAGNOSIS — Z8505 Personal history of malignant neoplasm of liver: Secondary | ICD-10-CM | POA: Insufficient documentation

## 2017-04-09 DIAGNOSIS — R339 Retention of urine, unspecified: Secondary | ICD-10-CM | POA: Diagnosis not present

## 2017-04-09 DIAGNOSIS — Y738 Miscellaneous gastroenterology and urology devices associated with adverse incidents, not elsewhere classified: Secondary | ICD-10-CM

## 2017-04-09 DIAGNOSIS — Y732 Prosthetic and other implants, materials and accessory gastroenterology and urology devices associated with adverse incidents: Secondary | ICD-10-CM

## 2017-04-09 DIAGNOSIS — T83038A Leakage of other indwelling urethral catheter, initial encounter: Secondary | ICD-10-CM | POA: Diagnosis not present

## 2017-04-09 DIAGNOSIS — T83091A Other mechanical complication of indwelling urethral catheter, initial encounter: Secondary | ICD-10-CM | POA: Diagnosis not present

## 2017-04-09 DIAGNOSIS — T83098A Other mechanical complication of other indwelling urethral catheter, initial encounter: Secondary | ICD-10-CM

## 2017-04-09 DIAGNOSIS — T839XXA Unspecified complication of genitourinary prosthetic device, implant and graft, initial encounter: Secondary | ICD-10-CM

## 2017-04-09 NOTE — Discharge Instructions (Signed)
Please read instructions below. Empty your leg bag frequently to avoid overflow. Follow up with urology as previously instructed. Return to the ER for fever, chills, vomiting, or new or concerning symptoms.

## 2017-04-09 NOTE — Discharge Instructions (Signed)
Read the information below.  You may return to the Emergency Department at any time for worsening condition or any new symptoms that concern you.   Please take the prescribed medication until advised to stop by your new urologist.  Please follow up with the urologist to have the catheter removed.    If you develop high fevers, abdominal pain, uncontrolled vomiting, the catheter stops draining, or are unable to tolerate fluids by mouth, return to the ER for a recheck.
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
## 2017-04-09 NOTE — ED Notes (Signed)
Pt given a large drainage bag for overnight use.
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
## 2017-04-09 NOTE — ED Triage Notes (Signed)
Pt was seen this morning due to his catheter bag leaking because of a cap missing.  Pt returns stating it is now leaking at his penis.  Pt denies feeling any pain or fullness in his abdomen or bladder.  No other complaints.  Ambulatory and independent.

## 2017-04-09 NOTE — ED Notes (Signed)
Checked urinary cath.  Leg bag has missing stopper on bottom of bag.  Bag leaking.  Changed leg bag for sterility.  Gave extra stopper to bag to patient and provided leg bag education.  Pt verbalized understanding.  Dry clothing given. PA notified of status to see patient.

## 2017-04-09 NOTE — ED Notes (Signed)
Catheter flushed. Small clots noted. No leaking seen around catheter at head of penis. Flowing back in to the bag normally.

## 2017-04-09 NOTE — ED Triage Notes (Signed)
Pt states noted leg bag leaking x 1 hour ago.  No  Pain.  Drainage is present.  Family states coming from bag.

## 2017-04-09 NOTE — ED Notes (Signed)
Bed: [facility] Expected date:  Expected time:  Means of arrival:  Comments: 

## 2017-04-12 ENCOUNTER — Encounter (HOSPITAL_COMMUNITY): Payer: Self-pay | Admitting: *Deleted

## 2017-04-15 ENCOUNTER — Ambulatory Visit (HOSPITAL_COMMUNITY): Admit: 2017-04-15 | Payer: Medicare HMO | Admitting: Internal Medicine

## 2017-04-15 ENCOUNTER — Encounter (HOSPITAL_COMMUNITY): Payer: Self-pay

## 2017-04-15 ENCOUNTER — Ambulatory Visit (HOSPITAL_COMMUNITY): Admission: RE | Admit: 2017-04-15 | Payer: Medicare HMO | Source: Ambulatory Visit | Admitting: Internal Medicine

## 2017-04-15 HISTORY — DX: Cardiac arrhythmia, unspecified: I49.9

## 2017-04-15 SURGERY — ESOPHAGOGASTRODUODENOSCOPY (EGD) WITH PROPOFOL
Anesthesia: Monitor Anesthesia Care

## 2017-04-15 MED ORDER — LIDOCAINE 2% (20 MG/ML) 5 ML SYRINGE
INTRAMUSCULAR | Status: AC
  Filled 2017-04-15: qty 5

## 2017-04-15 MED ORDER — PROPOFOL 10 MG/ML IV BOLUS
INTRAVENOUS | Status: AC
  Filled 2017-04-15: qty 60

## 2017-04-19 ENCOUNTER — Encounter: Payer: Self-pay | Admitting: Family Medicine

## 2017-04-19 ENCOUNTER — Ambulatory Visit (INDEPENDENT_AMBULATORY_CARE_PROVIDER_SITE_OTHER): Payer: Medicare HMO | Admitting: Family Medicine

## 2017-04-19 VITALS — BP 135/64 | HR 85 | Temp 98.1°F | Resp 16 | Ht 66.0 in | Wt 135.0 lb

## 2017-04-19 DIAGNOSIS — N390 Urinary tract infection, site not specified: Secondary | ICD-10-CM

## 2017-04-19 DIAGNOSIS — R310 Gross hematuria: Secondary | ICD-10-CM | POA: Diagnosis not present

## 2017-04-19 DIAGNOSIS — F329 Major depressive disorder, single episode, unspecified: Secondary | ICD-10-CM | POA: Diagnosis not present

## 2017-04-19 DIAGNOSIS — R319 Hematuria, unspecified: Secondary | ICD-10-CM

## 2017-04-19 DIAGNOSIS — R339 Retention of urine, unspecified: Secondary | ICD-10-CM

## 2017-04-19 DIAGNOSIS — F1010 Alcohol abuse, uncomplicated: Secondary | ICD-10-CM | POA: Diagnosis not present

## 2017-04-19 LAB — POCT URINALYSIS DIP (DEVICE)
GLUCOSE, UA: NEGATIVE mg/dL
GLUCOSE, UA: NEGATIVE mg/dL
KETONES UR: NEGATIVE mg/dL
Nitrite: POSITIVE — AB
Nitrite: POSITIVE — AB
Protein, ur: >=300 mg/dL — AB
SPECIFIC GRAVITY, URINE: 1.025 (ref 1.005–1.030)
Specific Gravity, Urine: 1.025 (ref 1.005–1.030)
Urobilinogen, UA: >=8 mg/dL (ref 0.0–1.0)
Urobilinogen, UA: 4 mg/dL — ABNORMAL HIGH (ref 0.0–1.0)
pH: 7 (ref 5.0–8.0)
pH: 7 (ref 5.0–8.0)

## 2017-04-19 MED ORDER — CIPROFLOXACIN HCL 250 MG PO TABS: 500.0000 mg | ORAL_TABLET | Freq: Two times a day (BID) | ORAL | 0 refills | Status: DC

## 2017-04-19 MED ORDER — TERAZOSIN HCL 1 MG PO CAPS: 1.0000 mg | ORAL_CAPSULE | Freq: Every day | ORAL | 1 refills | Status: DC

## 2017-04-19 NOTE — Progress Notes (Signed)
Patient ID: [name], male    DOB: Jan 04, 1951, 66 y.o.   MRN: [id number]  PCP: [name], FNP  Chief Complaint  Patient presents with  . Hospitalization Follow-up    urinary retention    Subjective:  HPI Peter Nelson is a 66 y.o. male presents for evaluation of urinary retention.He was seen at the emergency department for urinary retention and 04/08/2017. A foley catheter was in inserted and he was placed on tamsulosin 0.4 mg once daily. He subsequently returned to the emergency department as he noticed that his Foley was leaking. He presents today requesting removal of the Foley as it continues to leak. He reports penile pain at the insertion site. He reports that urine is draining around the catheter and also going into the collection bag . He reports that his urine is dark and has a strong odor. He reports the urinary retention is a new problem for him. He reports no history of BPH. Last PSA test was on 03/21/2017 and it was within normal range. Last office visit 03/21/2017, patient was placed on antibiotic and treated empirically for urinary tract infection with positive nitrates. Peter Nelson has a history of alcohol abuse, chronic hepatitis C, liver cancer. Peter Nelson also reports that he has started back drinking alcohol. He is having marital problems and is drinking chronically to reduce stress. He has thought about moving out reports his wife always finds them and bring him back home.  Social History   Social History  . Marital status: Married    Spouse name: [name]  . Number of children: 21  . Years of education: [name]   Occupational History  . Not on file.   Social History Main Topics  . Smoking status: Current Every Day Smoker    Packs/day: 0.25    Years: 55.00    Types: Cigarettes  . Smokeless tobacco: Never Used     Comment: cutting back  . Alcohol use No     Comment: "a 1/2 pint a day" for more than 30 years, average 1 pint a day, cut back lately   . Drug use: No   . Sexual activity: Yes    Partners: Female   Other Topics Concern  . Not on file   Social History Narrative   Married Dec 22nd, 2016 to [place]   Has #13 children    Family History  Problem Relation Age of Onset  . Hypertension Mother   . Diabetes Mother   . Heart disease Mother   . Alzheimer's disease Mother   . Diabetes Sister   . Diabetes Father   . Cancer Brother        Liver  . Colon cancer Neg Hx   . Esophageal cancer Neg Hx   . Stomach cancer Neg Hx   . Rectal cancer Neg Hx    Review of Systems  see history of present illness Patient Active Problem List   Diagnosis Date Noted  . Acute hypokalemia 01/12/2017  . Acute upper GI bleeding 12/29/2016  . 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 Ascites 2020-10-2016  . Hemorrhage of esophageal varices in alcoholic cirrhosis ([place])   . Upper GI bleeding   . Acute blood loss anemia 10/25/2016  . Enlarged prostate on rectal examination 10/01/2016  . Erectile dysfunction 10/01/2016  . Abscess of right lower leg 03/15/2016  . Eczema 01/26/2016  . Hepatocellular carcinoma ([place]) 11/13/2015  . Memory loss 11/10/2015  . Alcoholic cirrhosis of liver ([place])  11/10/2015  . Hepatitis, viral 11/10/2015  . Chronic hepatitis C without hepatic coma ([place]) 09/17/2015  . Stress at home 08/26/2015  . Alcohol abuse 07/28/2015  . Tobacco use disorder 07/28/2015  . Syncope 05/08/2015    Allergies  Allergen Reactions  . Aspirin Other (See Comments)    Does not take because of hep c    Prior to Admission medications   Medication Sig Start Date End Date Taking? Authorizing Provider  ciprofloxacin (CIPRO) 500 MG tablet Take 1 tablet (500 mg total) by mouth 2 (two) times daily. 03/21/17  Yes [name], FNP  fluticasone (FLONASE) 50 MCG/ACT nasal spray Place 1 spray into both nostrils daily as needed for allergies or rhinitis.   Yes [provider]  furosemide (LASIX) 40 MG tablet Take 1 tablet (40 mg total) by  mouth daily. 02/21/17 04/12/18 Yes Pyrtle, Lajuan Lines, MD  ketotifen (ALAWAY) 0.025 % ophthalmic solution Place 1 drop into both eyes 2 (two) times daily as needed (for allergy eyes.).   Yes [provider]  nadolol (CORGARD) 40 MG tablet Take 1 tablet (40 mg total) by mouth 2 (two) times daily. 02/21/17  Yes Pyrtle, Lajuan Lines, MD  pantoprazole (PROTONIX) 40 MG tablet Take 1 tablet (40 mg total) by mouth 2 (two) times daily. 02/07/17  Yes Pyrtle, Lajuan Lines, MD  potassium chloride SA (K-DUR,KLOR-CON) 20 MEQ tablet Take 2 tablets (40 mEq total) by mouth 2 (two) times daily. 01/12/17  Yes Debbe Odea, MD  RA VITAMIN B-12 TR 1000 MCG TBCR Take 1,000 mcg by mouth daily. 03/15/17  Yes [provider]  spironolactone (ALDACTONE) 50 MG tablet Take 1 tablet (50 mg total) by mouth 3 (three) times daily. 02/07/17 04/12/18 Yes Pyrtle, Lajuan Lines, MD  tamsulosin (FLOMAX) 0.4 MG CAPS capsule Take 1 capsule (0.4 mg total) by mouth daily. 04/08/17  Yes Malvin Johns, MD    Past Medical, Surgical Family and Social History reviewed and updated.    Objective:   Today's Vitals   04/19/17 0923  BP: 135/64  Pulse: 85  Resp: 16  Temp: 98.1 F (36.7 C)  TempSrc: Oral  SpO2: 100%  Weight: 135 lb (61.2 kg)  Height: 5' 6"  (1.676 m)    Wt Readings from Last 3 Encounters:  04/19/17 135 lb (61.2 kg)  03/21/17 132 lb 3.2 oz (60 kg)  02/21/17 122 lb (55.3 kg)    Physical Exam  Constitutional: He is oriented to person, place, and time. He appears well-developed and well-nourished.  Cardiovascular: Normal rate, regular rhythm, normal heart sounds and intact distal pulses.   Pulmonary/Chest: Effort normal and breath sounds normal.  Abdominal: Soft. Bowel sounds are normal. He exhibits no distension and no mass. There is no tenderness. There is no rebound and no guarding.  Genitourinary: Prostate is enlarged and tender.  Musculoskeletal: Normal range of motion.  Neurological: He is alert and oriented to person, place,  and time.  Skin: Skin is warm and dry.  Psychiatric: His behavior is normal. Judgment and thought content normal. His mood appears anxious. He exhibits a depressed mood.  Situational related to marital problems, and likely overall chronic health problems   Procedure note: Deflated 10 mL of normal saline from Foley. Catheter was removed without effort. No penile trauma noted. Patient tolerated procedure. Patient urinated 5 minutes after Foley was removed. Urine is significant for gross hematuria.    Assessment & Plan:  1. Urinary retention, prostate enlarged on exam. Patient requires further workup as he  is also at risk for bladder cancer secondary to mets as he is a current long term smoker and also has liver cancer. - Urine Culture - Ambulatory referral to Urology -Continue Flomax as prescribed -Adding Hytrin 1 mg daily  2. Gross hematuria, unknown etiology - Ambulatory referral to Urology  3. Alcohol abuse -Patient admits to resume an alcohol abuse is 1 week due to stress at home. -Recommend to get an assistance to stop drinking, AA meeting, [facility], family support  4. Reactive depression -Patient declines medication therapy. Recommended that he consider his options in order for him to gain a better perspective of the situation regarding his marriage. Consider marriage counseling. Most importantly get substance abuse counseling to assist with alcohol cessation.   5. Urinary tract infection with hematuria, site unspecified -Ciprofloxacin 500 mg by mouth 2 times daily with food, extended course for 21 days as this could possibly be an unresolved infection from UTI on 03/21/2017. Complete all medication.  Meds ordered this encounter  Medications  . terazosin (HYTRIN) 1 MG capsule    Sig: Take 1 capsule (1 mg total) by mouth at bedtime.    Dispense:  30 capsule    Refill:  1    Order Specific Question:   Supervising Provider    Answer:   Tresa Garter [id number]  .  ciprofloxacin (CIPRO) 250 MG tablet    Sig: Take 2 tablets (500 mg total) by mouth 2 (two) times daily.    Dispense:  42 tablet    Refill:  0    Order Specific Question:   Supervising Provider    Answer:   Tresa Garter [id number]   RTC: 2 weeks follow-up on urinary retention.  [name]. [name], MSN, FNP-C [facility]  [address] [place], [place] [address] [phone]

## 2017-04-19 NOTE — Patient Instructions (Addendum)
I am referring you to [facility] for further evaluation of urinary retention. If you experience any further episodes of urinary retention, report immediately to the emergency department. I will see you back here in the office in 2 weeks for reevaluation.  I'm sending her urine out for urine culture. In the meantime take ciprofloxacin 500 mg twice a day, daily, for 21 days.  I will send her urine out for culture which may require a different antibiotic. However start the ciprofloxacin and continue until all medication is finished.  For urinary retention continue Flomax 0.4 daily and I've added Hytrin 1 mg at bedtime for urinary retention.   Hematuria, Adult Hematuria is blood in your urine. It can be caused by a bladder infection, kidney infection, prostate infection, kidney stone, or cancer of your urinary tract. Infections can usually be treated with medicine, and a kidney stone usually will pass through your urine. If neither of these is the cause of your hematuria, further workup to find out the reason may be needed. It is very important that you tell your health care provider about any blood you see in your urine, even if the blood stops without treatment or happens without causing pain. Blood in your urine that happens and then stops and then happens again can be a symptom of a very serious condition. Also, pain is not a symptom in the initial stages of many urinary cancers. Follow these instructions at home:  Drink lots of fluid, 3-4 quarts a day. If you have been diagnosed with an infection, cranberry juice is especially recommended, in addition to large amounts of water.  Avoid caffeine, tea, and carbonated beverages because they tend to irritate the bladder.  Avoid alcohol because it may irritate the prostate.  Take all medicines as directed by your health care provider.  If you were prescribed an antibiotic medicine, finish it all even if you start to feel better.  If you have  been diagnosed with a kidney stone, follow your health care provider's instructions regarding straining your urine to catch the stone.  Empty your bladder often. Avoid holding urine for long periods of time.  After a bowel movement, women should cleanse front to back. Use each tissue only once.  Empty your bladder before and after sexual intercourse if you are a male. Contact a health care provider if:  You develop back pain.  You have a fever.  You have a feeling of sickness in your stomach (nausea) or vomiting.  Your symptoms are not better in 3 days. Return sooner if you are getting worse. Get help right away if:  You develop severe vomiting and are unable to keep the medicine down.  You develop severe back or abdominal pain despite taking your medicines.  You begin passing a large amount of blood or clots in your urine.  You feel extremely weak or faint, or you pass out. This information is not intended to replace advice given to you by your health care provider. Make sure you discuss any questions you have with your health care provider. Document Released: 10/04/2005 Document Revised: 03/11/2016 Document Reviewed: 06/04/2013 Elsevier Interactive Patient Education  2017 [place].  Acute Urinary Retention, Male Acute urinary retention is when you are unable to pee (urinate). Acute urinary retention is common in older men. Prostates can get bigger, which blocks the flow of pee. Follow these instructions at home:  Drink enough fluids to keep your pee clear or pale yellow.  If you are sent  home with a tube that drains the bladder (catheter), there will be a drainage bag attached to it. There are two types of bags. One is big that you can wear at night without having to empty it. One is smaller and needs to be emptied more often. ? Keep the drainage bag empty. ? Keep the drainage bag lower than your catheter.  Only take medicine as told by your doctor. Contact a doctor  if:  You have a low-grade fever.  You have spasms or you are leaking pee when you have spasms. Get help right away if:  You have chills or a fever.  Your catheter stops draining pee.  Your catheter falls out.  You have increased bleeding that does not stop after you have rested and increased the amount of fluids you had been drinking. This information is not intended to replace advice given to you by your health care provider. Make sure you discuss any questions you have with your health care provider. Document Released: 03/22/2008 Document Revised: 03/11/2016 Document Reviewed: 03/15/2013 Elsevier Interactive Patient Education  2017 [employer].

## 2017-04-20 ENCOUNTER — Encounter (HOSPITAL_COMMUNITY): Payer: Self-pay | Admitting: Emergency Medicine

## 2017-04-20 DIAGNOSIS — F17210 Nicotine dependence, cigarettes, uncomplicated: Secondary | ICD-10-CM | POA: Diagnosis not present

## 2017-04-20 DIAGNOSIS — Z8505 Personal history of malignant neoplasm of liver: Secondary | ICD-10-CM | POA: Diagnosis not present

## 2017-04-20 DIAGNOSIS — R1111 Vomiting without nausea: Secondary | ICD-10-CM | POA: Diagnosis not present

## 2017-04-20 DIAGNOSIS — C220 Liver cell carcinoma: Secondary | ICD-10-CM | POA: Diagnosis not present

## 2017-04-20 DIAGNOSIS — R0789 Other chest pain: Secondary | ICD-10-CM | POA: Diagnosis not present

## 2017-04-20 DIAGNOSIS — R1012 Left upper quadrant pain: Secondary | ICD-10-CM | POA: Diagnosis present

## 2017-04-20 DIAGNOSIS — Z79899 Other long term (current) drug therapy: Secondary | ICD-10-CM | POA: Diagnosis not present

## 2017-04-20 DIAGNOSIS — I10 Essential (primary) hypertension: Secondary | ICD-10-CM | POA: Diagnosis not present

## 2017-04-20 DIAGNOSIS — J45909 Unspecified asthma, uncomplicated: Secondary | ICD-10-CM | POA: Diagnosis not present

## 2017-04-20 DIAGNOSIS — K2970 Gastritis, unspecified, without bleeding: Secondary | ICD-10-CM | POA: Diagnosis not present

## 2017-04-20 DIAGNOSIS — K2920 Alcoholic gastritis without bleeding: Secondary | ICD-10-CM | POA: Insufficient documentation

## 2017-04-20 DIAGNOSIS — R9431 Abnormal electrocardiogram [ECG] [EKG]: Secondary | ICD-10-CM | POA: Diagnosis not present

## 2017-04-20 LAB — URINALYSIS, ROUTINE W REFLEX MICROSCOPIC
BACTERIA UA: NONE SEEN
BILIRUBIN URINE: NEGATIVE
GLUCOSE, UA: NEGATIVE mg/dL
KETONES UR: NEGATIVE mg/dL
LEUKOCYTES UA: NEGATIVE
NITRITE: NEGATIVE
PH: 6 (ref 5.0–8.0)
Protein, ur: NEGATIVE mg/dL
SPECIFIC GRAVITY, URINE: 1.009 (ref 1.005–1.030)

## 2017-04-20 NOTE — ED Triage Notes (Signed)
Pt comes from home with complaints of abdominal pain.  States he has a hiatal hernia, cirrhosis of the liver, and pancreatitis.  Still currently drinks every day. Vitals WNL. Ambulatory on scene with assistance. Wife at bedside.

## 2017-04-21 ENCOUNTER — Emergency Department (HOSPITAL_COMMUNITY)
Admission: EM | Admit: 2017-04-21 | Discharge: 2017-04-21 | Disposition: A | Discharge status: Home / Self Care | Payer: Medicare HMO | Attending: Emergency Medicine | Admitting: Emergency Medicine

## 2017-04-21 ENCOUNTER — Emergency Department (HOSPITAL_COMMUNITY): Payer: Medicare HMO

## 2017-04-21 ENCOUNTER — Encounter (HOSPITAL_COMMUNITY): Payer: Self-pay | Admitting: Emergency Medicine

## 2017-04-21 ENCOUNTER — Other Ambulatory Visit: Payer: Self-pay

## 2017-04-21 DIAGNOSIS — R0789 Other chest pain: Secondary | ICD-10-CM | POA: Diagnosis not present

## 2017-04-21 DIAGNOSIS — K2920 Alcoholic gastritis without bleeding: Secondary | ICD-10-CM | POA: Diagnosis not present

## 2017-04-21 LAB — COMPREHENSIVE METABOLIC PANEL
ALK PHOS: 118 U/L (ref 38–126)
ALT: 30 U/L (ref 17–63)
ANION GAP: 8 (ref 5–15)
AST: 80 U/L — ABNORMAL HIGH (ref 15–41)
Albumin: 3 g/dL — ABNORMAL LOW (ref 3.5–5.0)
BILIRUBIN TOTAL: 1.7 mg/dL — AB (ref 0.3–1.2)
BUN: 11 mg/dL (ref 6–20)
CO2: 23 mmol/L (ref 22–32)
Calcium: 8 mg/dL — ABNORMAL LOW (ref 8.9–10.3)
Chloride: 109 mmol/L (ref 101–111)
Creatinine, Ser: 0.82 mg/dL (ref 0.61–1.24)
GFR calc non Af Amer: >60 mL/min (ref >60)
Glucose, Bld: 115 mg/dL — ABNORMAL HIGH (ref 65–99)
Potassium: 3 mmol/L — ABNORMAL LOW (ref 3.5–5.1)
SODIUM: 140 mmol/L (ref 135–145)
TOTAL PROTEIN: 7.3 g/dL (ref 6.5–8.1)

## 2017-04-21 LAB — CBC
HEMATOCRIT: 32.5 % — AB (ref 39.0–52.0)
Hemoglobin: 10.2 g/dL — ABNORMAL LOW (ref 13.0–17.0)
MCH: 30.9 pg (ref 26.0–34.0)
MCHC: 31.4 g/dL (ref 30.0–36.0)
MCV: 98.5 fL (ref 78.0–100.0)
Platelets: 115 10*3/uL — ABNORMAL LOW (ref 150–400)
RBC: 3.3 MIL/uL — ABNORMAL LOW (ref 4.22–5.81)
RDW: 14.9 % (ref 11.5–15.5)
WBC: 5.5 10*3/uL (ref 4.0–10.5)

## 2017-04-21 LAB — I-STAT TROPONIN, ED: TROPONIN I, POC: 0.01 ng/mL (ref 0.00–0.08)

## 2017-04-21 LAB — LIPASE, BLOOD: Lipase: 42 U/L (ref 11–51)

## 2017-04-21 MED ORDER — ONDANSETRON HCL 4 MG/2ML IJ SOLN
4.0000 mg | Freq: Once | INTRAMUSCULAR | Status: AC
  Administered 2017-04-21: 4 mg via INTRAVENOUS
  Filled 2017-04-21: qty 2

## 2017-04-21 MED ORDER — SUCRALFATE 1 GM/10ML PO SUSP: 1.0000 g | Freq: Three times a day (TID) | ORAL | 0 refills | Status: DC

## 2017-04-21 MED ORDER — FAMOTIDINE 20 MG PO TABS: 20.0000 mg | ORAL_TABLET | Freq: Two times a day (BID) | ORAL | 0 refills | Status: DC

## 2017-04-21 MED ORDER — THIAMINE HCL 100 MG/ML IJ SOLN
Freq: Once | INTRAVENOUS | Status: AC
  Administered 2017-04-21: 04:00:00 via INTRAVENOUS
  Filled 2017-04-21: qty 1000

## 2017-04-21 MED ORDER — GI COCKTAIL ~~LOC~~
30.0000 mL | Freq: Once | ORAL | Status: AC
  Administered 2017-04-21: 30 mL via ORAL
  Filled 2017-04-21: qty 30

## 2017-04-21 MED ORDER — FAMOTIDINE IN NACL 20-0.9 MG/50ML-% IV SOLN
20.0000 mg | Freq: Once | INTRAVENOUS | Status: AC
  Administered 2017-04-21: 20 mg via INTRAVENOUS
  Filled 2017-04-21: qty 50
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
## 2017-04-21 NOTE — ED Notes (Signed)
Patient complaining of upper epigastric pain

## 2017-04-23 LAB — URINE CULTURE

## 2017-04-30 MED ORDER — SULFAMETHOXAZOLE-TRIMETHOPRIM 800-160 MG PO TABS: 1.0000 | ORAL_TABLET | Freq: Two times a day (BID) | ORAL | 0 refills | Status: AC

## 2017-04-30 NOTE — Addendum Note (Signed)
Addended by: [name] on: 04/30/2017 02:56 PM   Modules accepted: Orders

## 2017-04-30 NOTE — Addendum Note (Signed)
Addended by: [name] on: 04/30/2017 02:55 PM   Modules accepted: Orders

## 2017-05-04 ENCOUNTER — Encounter: Payer: Self-pay | Admitting: Family Medicine

## 2017-05-04 ENCOUNTER — Ambulatory Visit (INDEPENDENT_AMBULATORY_CARE_PROVIDER_SITE_OTHER): Payer: Medicare HMO | Admitting: Family Medicine

## 2017-05-04 VITALS — BP 146/80 | HR 86 | Temp 98.8°F | Resp 14 | Ht 66.0 in | Wt 134.4 lb

## 2017-05-04 DIAGNOSIS — R339 Retention of urine, unspecified: Secondary | ICD-10-CM

## 2017-05-04 LAB — POCT URINALYSIS DIP (DEVICE)
Glucose, UA: NEGATIVE mg/dL
KETONES UR: NEGATIVE mg/dL
Nitrite: POSITIVE — AB
PH: 7 (ref 5.0–8.0)
PROTEIN: 100 mg/dL — AB
SPECIFIC GRAVITY, URINE: 1.02 (ref 1.005–1.030)
Urobilinogen, UA: >=8 mg/dL (ref 0.0–1.0)

## 2017-05-04 NOTE — Progress Notes (Signed)
Patient ID: Peter Nelson, male    DOB: 03/22/1951, 66 y.o.   MRN: [id number]  PCP: [name], FNP  Chief Complaint  Patient presents with  . Follow-up    2 weeks on urinary retention    Subjective:  HPI Peter Nelson is a 66 y.o. male presents for evaluation of urinary retention. Also was last seen in office on 04/19/2017 for removal of a indwelling catheter which was placed in the emergency room on 04/09/2017 for significant urinary retention. Upon removal of the catheter, patient was able to void a clean catch specimen which revealed a very tract infection. Received urine culture back which showed resistance to the ciprofloxacin that was originally prescribed. Patient subsequently ha has been placed on Bactrim which he reports aking and will take for complete 3 weeks. He was also placed on terazosin and continued on Flomax for relief of urinary retention. He reports he has had no issues with urinating since his last visit. He reports that his urine is slightly dark but not amber colored as it was previously. He denies any associated fever, chills, nausea, vomiting. He reports that things have improved with his wife at home and he has not been drinking since his last office visit. Social History   Social History  . Marital status: Married    Spouse name: [name]  . Number of children: 65  . Years of education: [name]   Occupational History  . Not on file.   Social History Main Topics  . Smoking status: Current Every Day Smoker    Packs/day: 0.25    Years: 55.00    Types: Cigarettes  . Smokeless tobacco: Never Used     Comment: cutting back  . Alcohol use 0.0 oz/week     Comment: "a 1/2 pint a day" for more than 30 years, average 1 pint a day, cut back lately   . Drug use: No  . Sexual activity: Yes    Partners: Female   Other Topics Concern  . Not on file   Social History Narrative   Married Dec 22nd, 2016 to [place]   Has #13 children    Family History  Problem  Relation Age of Onset  . Hypertension Mother   . Diabetes Mother   . Heart disease Mother   . Alzheimer's disease Mother   . Diabetes Sister   . Diabetes Father   . Cancer Brother        Liver  . Colon cancer Neg Hx   . Esophageal cancer Neg Hx   . Stomach cancer Neg Hx   . Rectal cancer Neg Hx    Review of Systems See history of present illness Patient Active Problem List   Diagnosis Date Noted  . Acute hypokalemia 01/12/2017  . Acute upper GI bleeding 12/29/2016  . Esophageal varices in alcoholic cirrhosis ([place])   . Ascites 2020/02/3117  . Hemorrhage of esophageal varices in alcoholic cirrhosis ([place])   . Upper GI bleeding   . Acute blood loss anemia 10/25/2016  . Enlarged prostate on rectal examination 10/01/2016  . Erectile dysfunction 10/01/2016  . Abscess of right lower leg 03/15/2016  . Eczema 01/26/2016  . Hepatocellular carcinoma ([place]) 11/13/2015  . Memory loss 11/10/2015  . Alcoholic cirrhosis of liver ([place]) 11/10/2015  . Hepatitis, viral 11/10/2015  . Chronic hepatitis C without hepatic coma ([place]) 09/17/2015  . Stress at home 08/26/2015  . Alcohol abuse 07/28/2015  . Tobacco use disorder 07/28/2015  . Syncope  05/08/2015    Allergies  Allergen Reactions  . Aspirin Other (See Comments)    Does not take because of hep c    Prior to Admission medications   Medication Sig Start Date End Date Taking? Authorizing Provider  famotidine (PEPCID) 20 MG tablet Take 1 tablet (20 mg total) by mouth 2 (two) times daily. 04/21/17  Yes Palumbo, April, MD  fluticasone [facility]) 50 MCG/ACT nasal spray Place 1 spray into both nostrils daily as needed for allergies or rhinitis.   Yes [provider]  folic acid (FOLVITE) 1 MG tablet Take 1 mg by mouth daily.   Yes [provider]  furosemide (LASIX) 40 MG tablet Take 1 tablet (40 mg total) by mouth daily. 02/21/17 04/12/18 Yes Pyrtle, Lajuan Lines, MD  ketotifen (ALAWAY) 0.025 % ophthalmic solution Place 1 drop into both  eyes 2 (two) times daily as needed (for allergy eyes.).   Yes [provider]  nadolol (CORGARD) 40 MG tablet Take 1 tablet (40 mg total) by mouth 2 (two) times daily. 02/21/17  Yes Pyrtle, Lajuan Lines, MD  pantoprazole (PROTONIX) 40 MG tablet Take 1 tablet (40 mg total) by mouth 2 (two) times daily. 02/07/17  Yes Pyrtle, Lajuan Lines, MD  potassium chloride SA (K-DUR,KLOR-CON) 20 MEQ tablet Take 2 tablets (40 mEq total) by mouth 2 (two) times daily. 01/12/17  Yes Debbe Odea, MD  RA VITAMIN B-12 TR 1000 MCG TBCR Take 1,000 mcg by mouth daily. 03/15/17  Yes [provider]  spironolactone (ALDACTONE) 50 MG tablet Take 1 tablet (50 mg total) by mouth 3 (three) times daily. 02/07/17 04/12/18 Yes Pyrtle, Lajuan Lines, MD  sucralfate (CARAFATE) 1 GM/10ML suspension Take 10 mLs (1 g total) by mouth 4 (four) times daily -  with meals and at bedtime. 04/21/17  Yes Palumbo, April, MD  sulfamethoxazole-trimethoprim (BACTRIM DS,SEPTRA DS) 800-160 MG tablet Take 1 tablet by mouth 2 (two) times daily. 04/30/17 05/21/17 Yes [name], FNP  tamsulosin (FLOMAX) 0.4 MG CAPS capsule Take 1 capsule (0.4 mg total) by mouth daily. 04/08/17  Yes Malvin Johns, MD  terazosin (HYTRIN) 1 MG capsule Take 1 capsule (1 mg total) by mouth at bedtime. 04/19/17  Yes [name], FNP    Past Medical, Surgical Family and Social History reviewed and updated.    Objective:   Today's Vitals   05/04/17 0856 05/04/17 1018  BP: (!) 153/72 (!) 146/80  Pulse: 86 86  Resp: 14   Temp: 98.8 F (37.1 C)   TempSrc: Oral   SpO2: 100% 100%  Weight: 134 lb 6.4 oz (61 kg)   Height: 5' 6"  (1.676 m)      Wt Readings from Last 3 Encounters:  05/04/17 134 lb 6.4 oz (61 kg)  04/20/17 135 lb (61.2 kg)  04/19/17 135 lb (61.2 kg)   Physical Exam  Constitutional: He appears well-developed and well-nourished.  HENT:  Head: Normocephalic and atraumatic.  Eyes: Pupils are equal, round, and reactive to light.  Cardiovascular: Normal  rate, regular rhythm, normal heart sounds and intact distal pulses.   Pulmonary/Chest: Effort normal and breath sounds normal.  Abdominal: There is no CVA tenderness.  Psychiatric: He has a normal mood and affect. His behavior is normal. Judgment and thought content normal.   Assessment & Plan:  1. Urinary retention -Follow up with [facility] to reschedule missed urology appointment. -Continue Flomax and Terazosin as prescribed. -Continue Bactrim, complete all medication.  Return for a urine sample  analysis in one month.  [name]. [name], MSN, FNP-C [facility]  [address] Barbara C[address] [place], [place] [address] [phone]
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
## 2017-05-04 NOTE — Patient Instructions (Signed)
Please call [facility] at [phone], to reschedule your appointment.   Acute Urinary Retention, Male Acute urinary retention is when you are unable to pee (urinate). Acute urinary retention is common in older men. Prostates can get bigger, which blocks the flow of pee. Follow these instructions at home:  Drink enough fluids to keep your pee clear or pale yellow.  If you are sent home with a tube that drains the bladder (catheter), there will be a drainage bag attached to it. There are two types of bags. One is big that you can wear at night without having to empty it. One is smaller and needs to be emptied more often. ? Keep the drainage bag empty. ? Keep the drainage bag lower than your catheter.  Only take medicine as told by your doctor. Contact a doctor if:  You have a low-grade fever.  You have spasms or you are leaking pee when you have spasms. Get help right away if:  You have chills or a fever.  Your catheter stops draining pee.  Your catheter falls out.  You have increased bleeding that does not stop after you have rested and increased the amount of fluids you had been drinking. This information is not intended to replace advice given to you by your health care provider. Make sure you discuss any questions you have with your health care provider. Document Released: 03/22/2008 Document Revised: 03/11/2016 Document Reviewed: 03/15/2013 Elsevier Interactive Patient Education  2017 [employer].

## 2017-05-25 ENCOUNTER — Encounter: Payer: Self-pay | Admitting: Internal Medicine

## 2017-06-01 ENCOUNTER — Ambulatory Visit: Payer: Medicare HMO | Admitting: Family Medicine

## 2017-09-20 ENCOUNTER — Ambulatory Visit: Payer: Medicare HMO | Admitting: Family Medicine

## 2017-11-04 IMAGING — CR DG CHEST 2V
2 series · 2 of 2 positions shown · non-contrast
Comparison: 10/26/2016

CLINICAL DATA: Swelling in the bilateral legs and scrotum

EXAM:
CHEST  2 VIEW

[w chest pa]
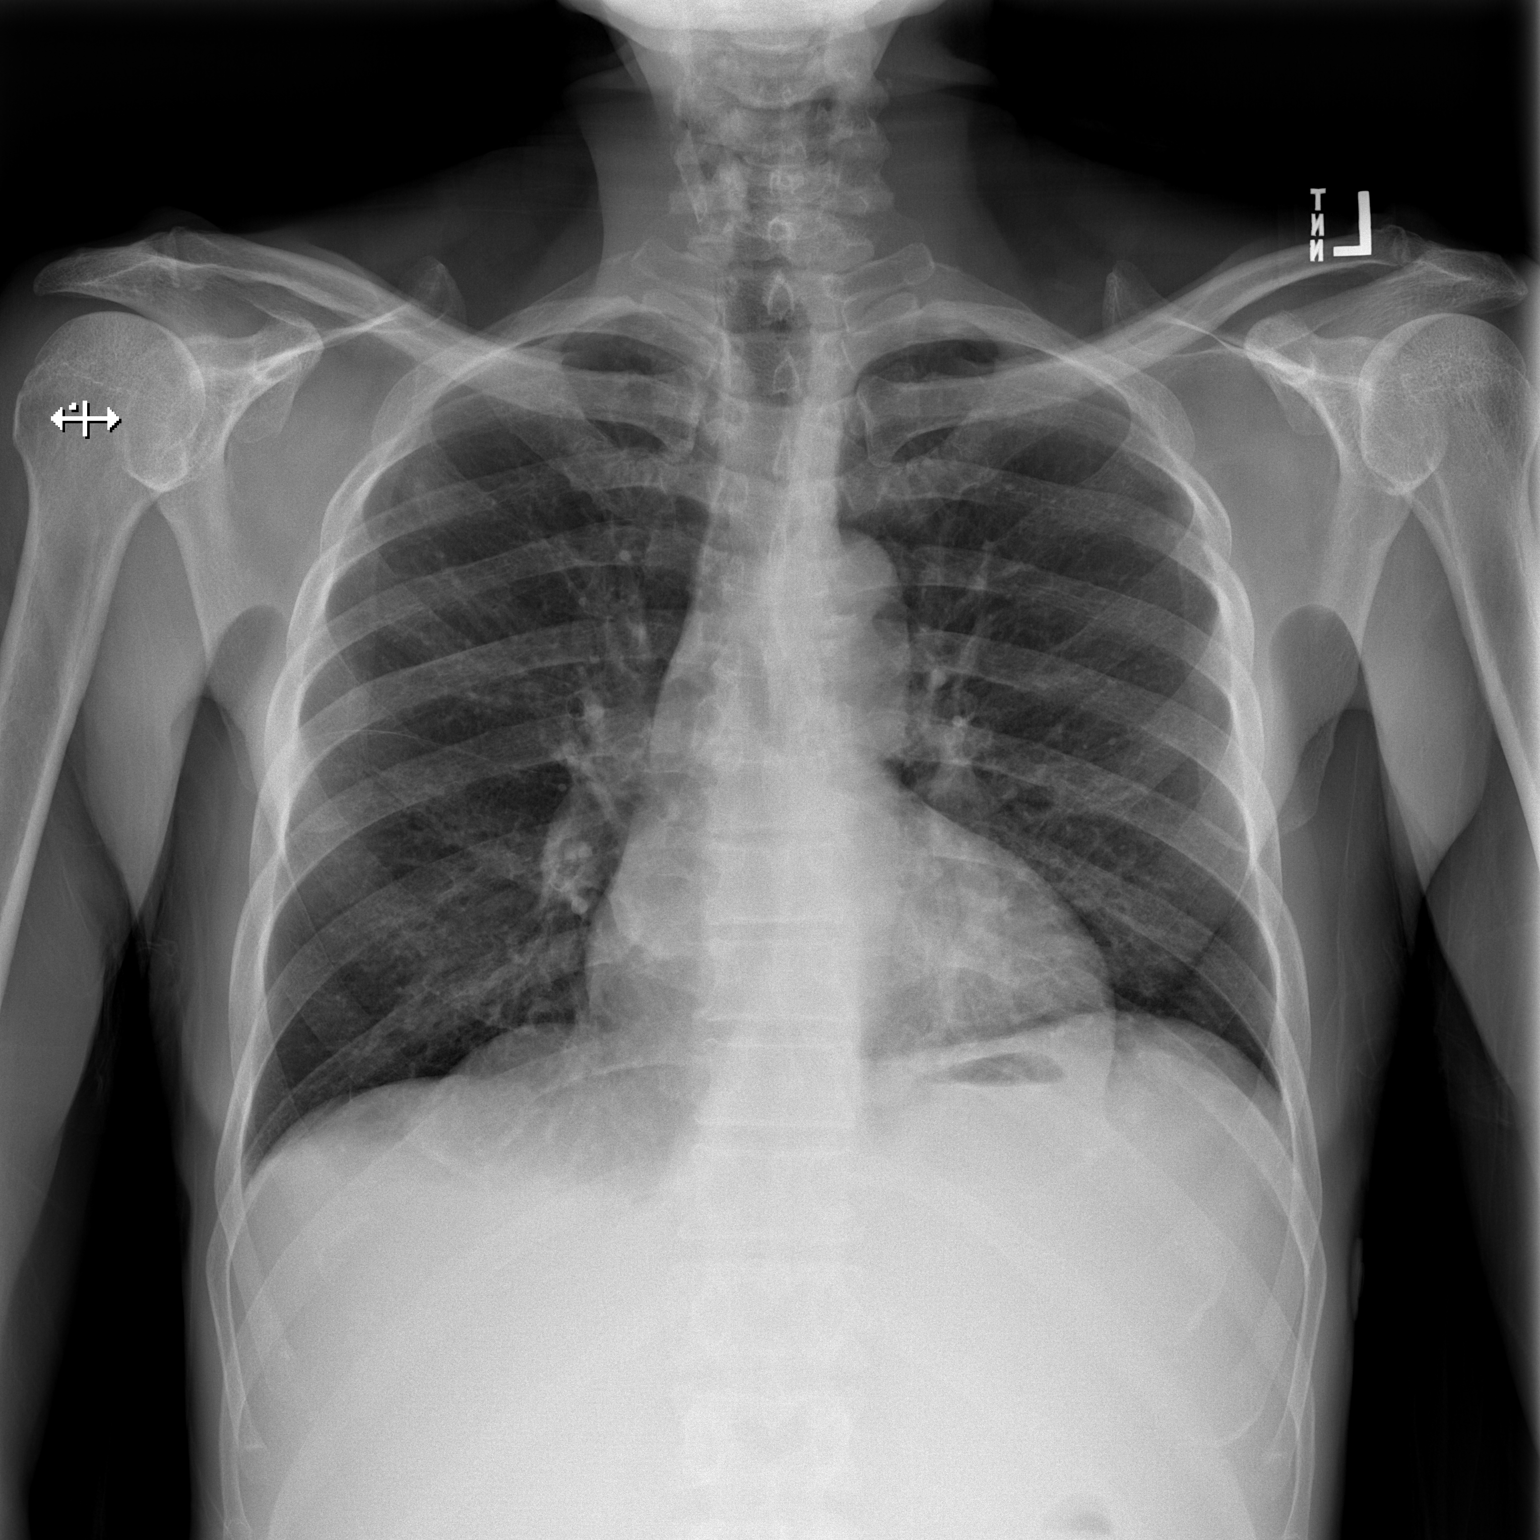

[w chest lat]
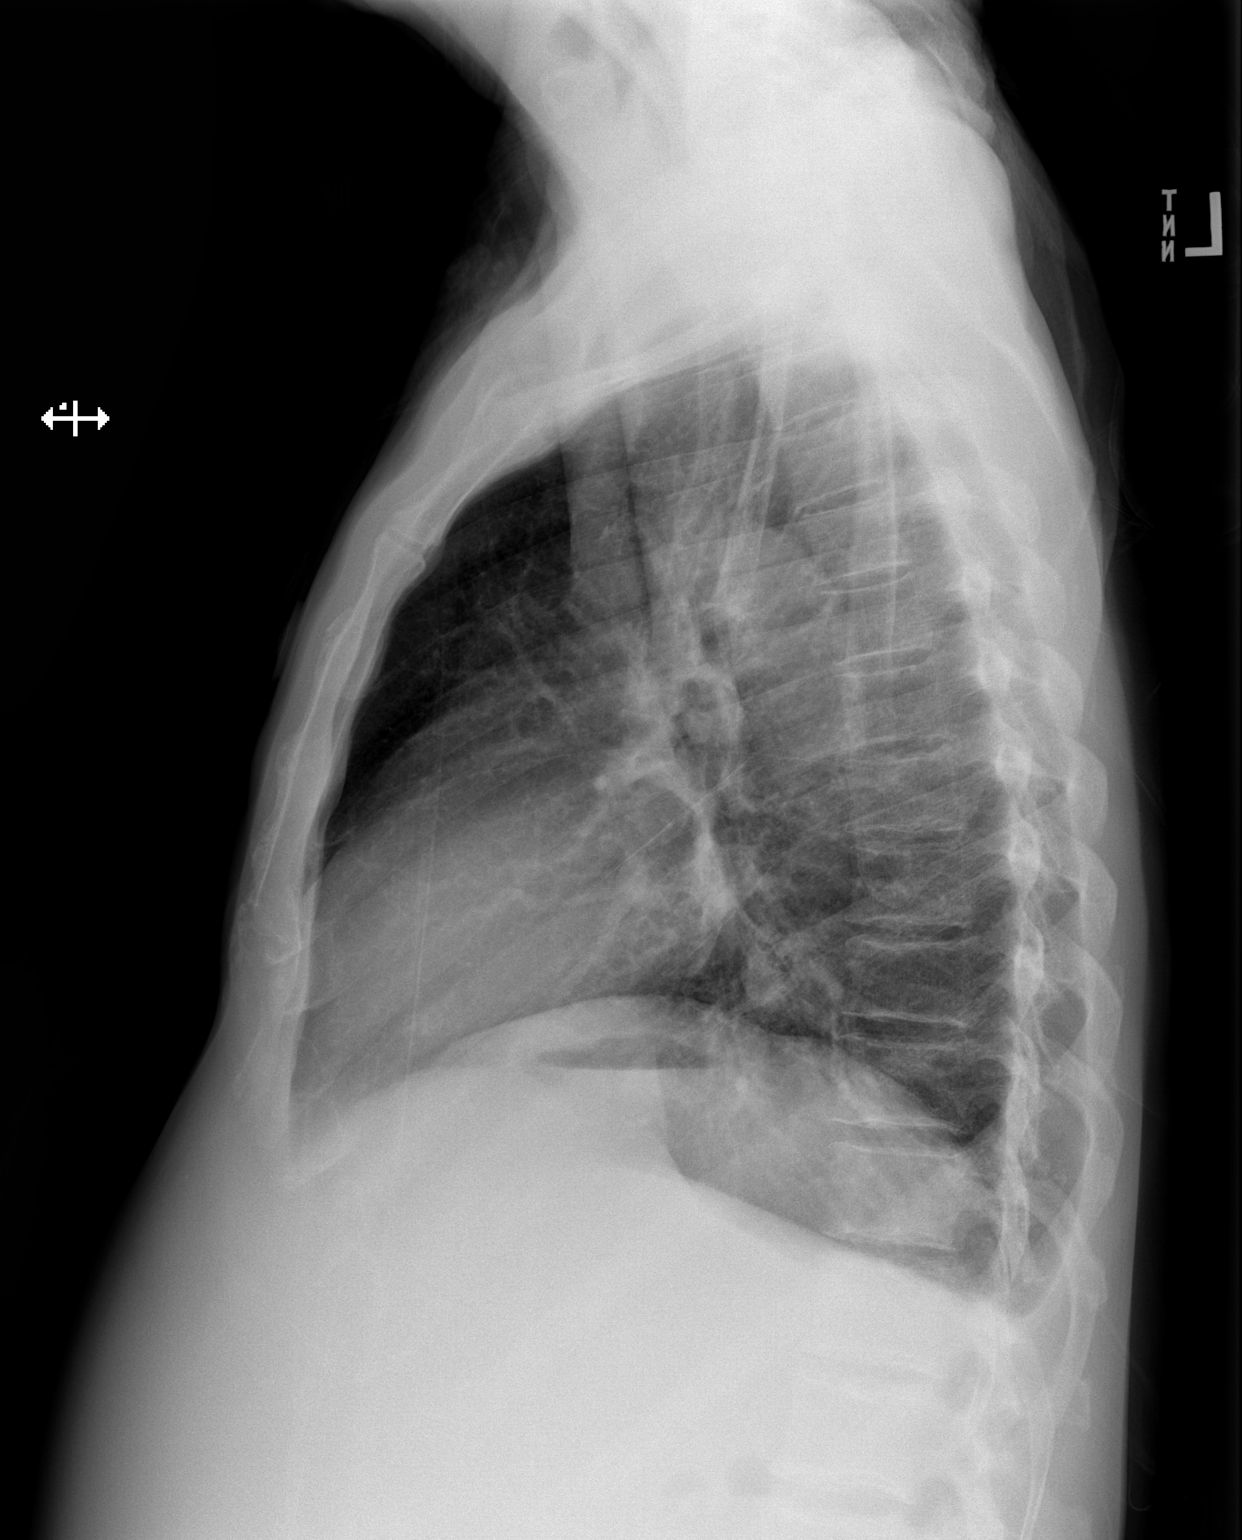

[2 of 2 positions shown; findings below may reference images not displayed]

FINDINGS: Slight elevation of left diaphragm. Tiny pleural effusions on the
lateral view. Mild atelectasis left base. No consolidation. Heart
size upper normal. Vascularity within normal limits. No
pneumothorax.
IMPRESSION: Tiny bilateral pleural effusions.  Mild atelectasis left lung base.

## 2017-11-09 ENCOUNTER — Ambulatory Visit: Payer: Commercial Managed Care - HMO | Admitting: Neurology

## 2017-11-28 ENCOUNTER — Telehealth: Payer: Self-pay | Admitting: Internal Medicine

## 2017-11-28 NOTE — Telephone Encounter (Signed)
Spoke with pts wife and let her know to call 911 and get him to the ER to be seen. She verbalized understanding.

## 2017-12-04 ENCOUNTER — Emergency Department (HOSPITAL_COMMUNITY)
Admission: EM | Admit: 2017-12-04 | Discharge: 2017-12-04 | Disposition: A | Discharge status: Home / Self Care | Payer: Medicare HMO | Attending: Emergency Medicine | Admitting: Emergency Medicine

## 2017-12-04 ENCOUNTER — Encounter (HOSPITAL_COMMUNITY): Payer: Self-pay | Admitting: Emergency Medicine

## 2017-12-04 DIAGNOSIS — J45909 Unspecified asthma, uncomplicated: Secondary | ICD-10-CM | POA: Insufficient documentation

## 2017-12-04 DIAGNOSIS — I10 Essential (primary) hypertension: Secondary | ICD-10-CM | POA: Insufficient documentation

## 2017-12-04 DIAGNOSIS — L84 Corns and callosities: Secondary | ICD-10-CM

## 2017-12-04 DIAGNOSIS — F17210 Nicotine dependence, cigarettes, uncomplicated: Secondary | ICD-10-CM | POA: Insufficient documentation

## 2017-12-04 DIAGNOSIS — M79675 Pain in left toe(s): Secondary | ICD-10-CM | POA: Diagnosis present

## 2017-12-04 DIAGNOSIS — Z8505 Personal history of malignant neoplasm of liver: Secondary | ICD-10-CM | POA: Insufficient documentation

## 2017-12-04 DIAGNOSIS — Z79899 Other long term (current) drug therapy: Secondary | ICD-10-CM | POA: Insufficient documentation

## 2017-12-04 DIAGNOSIS — L988 Other specified disorders of the skin and subcutaneous tissue: Secondary | ICD-10-CM | POA: Diagnosis not present

## 2017-12-04 NOTE — ED Triage Notes (Signed)
PT states "boil" to left foot for 2 weeks. Pt has dry skin to foot and callous noted to left foot. No drainage no redness. Pt is not a diabetic.

## 2017-12-04 NOTE — ED Notes (Signed)
Declined W/C at D/C and was escorted to lobby by RN. 
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
## 2017-12-04 NOTE — Discharge Instructions (Signed)
Please see the information and instructions below regarding your visit.  Your diagnoses today include:  1. Corn or callus    This is a growth due to friction forces on the skin.  It may need to be removed by podiatrist.  I provided a follow-up.  Please call Dr. Amalia Hailey as soon as possible.  Tests performed today include: See side panel of your discharge paperwork for testing performed today. Vital signs are listed at the bottom of these instructions.   Medications prescribed:    Take any prescribed medications only as prescribed, and any over the counter medications only as directed on the packaging.  Home care instructions:  Please follow any educational materials contained in this packet.   Please purchase the covers the pharmacy similar to the images I provided in this paperwork.  It will prevent growth of the corner callus.  Follow-up instructions: Please follow-up with your primary care provider as soon as possible for further evaluation of your symptoms if they are not completely improved as well as BP recheck.  Return instructions:  Please return to the Emergency Department if you experience worsening symptoms.  Please return to the emergency department for any redness around the site, drainage or changes in coloration to your foot. Please return if you have any other emergent concerns.  Additional Information:   Your vital signs today were: BP (!) 161/83    Pulse 95    Temp 98.2 F (36.8 C) (Oral)    Resp 17    SpO2 98%  If your blood pressure (BP) was elevated on multiple readings during this visit above 130 for the top number or above 80 for the bottom number, please have this repeated by your primary care provider within one month. --------------  Thank you for allowing [place] to participate in your care today.

## 2018-02-09 ENCOUNTER — Emergency Department (HOSPITAL_COMMUNITY)
Admission: EM | Admit: 2018-02-09 | Discharge: 2018-02-09 | Disposition: A | Discharge status: Home / Self Care | Payer: Medicare HMO | Attending: Emergency Medicine | Admitting: Emergency Medicine

## 2018-02-09 ENCOUNTER — Emergency Department (HOSPITAL_COMMUNITY): Payer: Medicare HMO

## 2018-02-09 ENCOUNTER — Encounter (HOSPITAL_COMMUNITY): Payer: Self-pay | Admitting: Emergency Medicine

## 2018-02-09 DIAGNOSIS — M25552 Pain in left hip: Secondary | ICD-10-CM | POA: Insufficient documentation

## 2018-02-09 DIAGNOSIS — S79912A Unspecified injury of left hip, initial encounter: Secondary | ICD-10-CM | POA: Diagnosis not present

## 2018-02-09 DIAGNOSIS — F17210 Nicotine dependence, cigarettes, uncomplicated: Secondary | ICD-10-CM | POA: Insufficient documentation

## 2018-02-09 DIAGNOSIS — M79672 Pain in left foot: Secondary | ICD-10-CM | POA: Diagnosis not present

## 2018-02-09 DIAGNOSIS — Z8505 Personal history of malignant neoplasm of liver: Secondary | ICD-10-CM | POA: Insufficient documentation

## 2018-02-09 DIAGNOSIS — I10 Essential (primary) hypertension: Secondary | ICD-10-CM | POA: Insufficient documentation
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
## 2018-02-09 NOTE — ED Notes (Signed)
PA at bedside.
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
## 2018-02-09 NOTE — Discharge Instructions (Addendum)
You have been seen today for a hip injury injury. There were no acute abnormalities on the x-rays, including no sign of fracture or dislocation, however, there could be injuries to the soft tissues, such as the ligaments or tendons that are not seen on xrays. There could also be what are called occult fractures that are small fractures not seen on xray. Pain: Take 400 mg of ibuprofen every 6 hours for the next 3 days. After this time, this medication may be used as needed for pain. Take this medication with food to avoid upset stomach.  In some circumstances, medications like ibuprofen may increase bleeding risk.  This should be weighed against pain management. Ice: May apply ice to the area over the next 24 hours for 15 minutes at a time to reduce swelling. Elevation: Keep the extremity elevated as often as possible to reduce pain and inflammation. Exercises: Start by performing these exercises a few times a week, increasing the frequency until you are performing them twice daily.  Follow up: Follow-up with orthopedic specialist on this matter.  Call the number provided to set up an appointment. For the foot pain, follow-up with the podiatrist.

## 2018-02-23 ENCOUNTER — Ambulatory Visit: Payer: Self-pay | Admitting: Podiatry

## 2018-02-24 ENCOUNTER — Other Ambulatory Visit: Payer: Self-pay

## 2018-02-24 ENCOUNTER — Telehealth: Payer: Self-pay

## 2018-02-24 DIAGNOSIS — Z8505 Personal history of malignant neoplasm of liver: Secondary | ICD-10-CM

## 2018-02-24 NOTE — Telephone Encounter (Signed)
-----   Message from Doristine Counter, RN sent at 02/28/2017 12:24 PM EDT ----- Schedule 1 year MRI liver (liver protocol for hepatocellular carcinoma)

## 2018-02-24 NOTE — Telephone Encounter (Signed)
Pt scheduled for MRI of abd liver protocol at [facility] 03/10/18@10am , pt to arrive there at 9:30am. Pt to be NPO after 6am. Pts wife aware of appt.

## 2018-03-02 ENCOUNTER — Ambulatory Visit (HOSPITAL_COMMUNITY)
Admission: EM | Admit: 2018-03-02 | Discharge: 2018-03-02 | Disposition: A | Discharge status: Home / Self Care | Payer: Medicare HMO | Attending: Family Medicine | Admitting: Family Medicine

## 2018-03-02 ENCOUNTER — Encounter (HOSPITAL_COMMUNITY): Payer: Self-pay | Admitting: Emergency Medicine

## 2018-03-02 ENCOUNTER — Ambulatory Visit: Payer: Medicare HMO | Admitting: Podiatry

## 2018-03-02 ENCOUNTER — Ambulatory Visit (INDEPENDENT_AMBULATORY_CARE_PROVIDER_SITE_OTHER): Payer: Medicare HMO

## 2018-03-02 ENCOUNTER — Other Ambulatory Visit: Payer: Self-pay

## 2018-03-02 DIAGNOSIS — L84 Corns and callosities: Secondary | ICD-10-CM | POA: Diagnosis not present

## 2018-03-02 DIAGNOSIS — M79672 Pain in left foot: Secondary | ICD-10-CM | POA: Diagnosis not present

## 2018-03-02 NOTE — ED Triage Notes (Signed)
Pt states he is having pain on the lateral side of his left foot x1 month.  No report of injury to the foot.

## 2018-03-02 NOTE — Discharge Instructions (Signed)
May use a corn pad to protect the affected area. Wear shoes that have loose toe box to prevent rubbing and irritation. Please follow up with podiatry for definitive treatment.
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
## 2018-03-10 ENCOUNTER — Ambulatory Visit (HOSPITAL_COMMUNITY): Admission: RE | Admit: 2018-03-10 | Payer: Medicare HMO | Source: Ambulatory Visit

## 2018-05-01 ENCOUNTER — Ambulatory Visit: Payer: Medicare HMO | Admitting: Neurology

## 2018-05-26 ENCOUNTER — Emergency Department (HOSPITAL_COMMUNITY): Payer: Medicare HMO

## 2018-05-26 ENCOUNTER — Encounter (HOSPITAL_COMMUNITY): Payer: Self-pay | Admitting: Emergency Medicine

## 2018-05-26 ENCOUNTER — Emergency Department (HOSPITAL_COMMUNITY)
Admission: EM | Admit: 2018-05-26 | Discharge: 2018-05-26 | Disposition: A | Discharge status: Home / Self Care | Payer: Medicare HMO | Attending: Emergency Medicine | Admitting: Emergency Medicine

## 2018-05-26 DIAGNOSIS — S3992XA Unspecified injury of lower back, initial encounter: Secondary | ICD-10-CM | POA: Diagnosis not present

## 2018-05-26 DIAGNOSIS — Y939 Activity, unspecified: Secondary | ICD-10-CM | POA: Insufficient documentation

## 2018-05-26 DIAGNOSIS — Y999 Unspecified external cause status: Secondary | ICD-10-CM | POA: Diagnosis not present

## 2018-05-26 DIAGNOSIS — Y92410 Unspecified street and highway as the place of occurrence of the external cause: Secondary | ICD-10-CM | POA: Diagnosis not present

## 2018-05-26 DIAGNOSIS — S39012A Strain of muscle, fascia and tendon of lower back, initial encounter: Secondary | ICD-10-CM | POA: Diagnosis not present

## 2018-05-26 DIAGNOSIS — F17210 Nicotine dependence, cigarettes, uncomplicated: Secondary | ICD-10-CM | POA: Diagnosis not present

## 2018-05-26 DIAGNOSIS — M545 Low back pain: Secondary | ICD-10-CM | POA: Diagnosis not present

## 2018-05-26 DIAGNOSIS — J45909 Unspecified asthma, uncomplicated: Secondary | ICD-10-CM | POA: Diagnosis not present

## 2018-05-26 DIAGNOSIS — Z8505 Personal history of malignant neoplasm of liver: Secondary | ICD-10-CM | POA: Insufficient documentation

## 2018-05-26 MED ORDER — METHOCARBAMOL 750 MG PO TABS: 750.0000 mg | ORAL_TABLET | Freq: Four times a day (QID) | ORAL | 0 refills | Status: DC

## 2018-05-26 MED ORDER — DIAZEPAM 2 MG PO TABS: 2.0000 mg | ORAL_TABLET | Freq: Four times a day (QID) | ORAL | 0 refills | Status: DC | PRN

## 2018-05-26 MED ORDER — DIAZEPAM 5 MG PO TABS
5.0000 mg | ORAL_TABLET | Freq: Once | ORAL | Status: AC
  Administered 2018-05-26: 5 mg via ORAL
  Filled 2018-05-26: qty 1
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
## 2018-05-26 NOTE — ED Notes (Signed)
Immediately after being registered, Pt decided to have a cigarette prior to being seen.  Pt noted to be standing at the street.  He is aware that his room is available.

## 2018-05-26 NOTE — ED Notes (Signed)
Pt verbalizes understanding of discharge and follow up. Pt denies any addition questions at this time.

## 2018-08-07 ENCOUNTER — Encounter (HOSPITAL_COMMUNITY): Payer: Self-pay

## 2018-08-07 ENCOUNTER — Inpatient Hospital Stay (HOSPITAL_COMMUNITY)
Admission: EM | Admit: 2018-08-07 | Discharge: 2018-08-10 | DRG: 369 | Disposition: A | Discharge status: Home / Self Care | Payer: Medicare HMO | Attending: Internal Medicine | Admitting: Internal Medicine

## 2018-08-07 ENCOUNTER — Other Ambulatory Visit: Payer: Self-pay

## 2018-08-07 ENCOUNTER — Observation Stay (HOSPITAL_COMMUNITY): Payer: Medicare HMO

## 2018-08-07 DIAGNOSIS — Z79899 Other long term (current) drug therapy: Secondary | ICD-10-CM

## 2018-08-07 DIAGNOSIS — I10 Essential (primary) hypertension: Secondary | ICD-10-CM | POA: Diagnosis present

## 2018-08-07 DIAGNOSIS — K7030 Alcoholic cirrhosis of liver without ascites: Secondary | ICD-10-CM | POA: Diagnosis not present

## 2018-08-07 DIAGNOSIS — I8501 Esophageal varices with bleeding: Secondary | ICD-10-CM | POA: Diagnosis not present

## 2018-08-07 DIAGNOSIS — I8500 Esophageal varices without bleeding: Secondary | ICD-10-CM | POA: Diagnosis not present

## 2018-08-07 DIAGNOSIS — Z886 Allergy status to analgesic agent status: Secondary | ICD-10-CM | POA: Diagnosis not present

## 2018-08-07 DIAGNOSIS — K317 Polyp of stomach and duodenum: Secondary | ICD-10-CM | POA: Diagnosis present

## 2018-08-07 DIAGNOSIS — D62 Acute posthemorrhagic anemia: Secondary | ICD-10-CM | POA: Diagnosis present

## 2018-08-07 DIAGNOSIS — F1020 Alcohol dependence, uncomplicated: Secondary | ICD-10-CM | POA: Diagnosis present

## 2018-08-07 DIAGNOSIS — Z9114 Patient's other noncompliance with medication regimen: Secondary | ICD-10-CM | POA: Diagnosis not present

## 2018-08-07 DIAGNOSIS — R0789 Other chest pain: Secondary | ICD-10-CM | POA: Diagnosis not present

## 2018-08-07 DIAGNOSIS — K920 Hematemesis: Secondary | ICD-10-CM

## 2018-08-07 DIAGNOSIS — K766 Portal hypertension: Secondary | ICD-10-CM | POA: Diagnosis present

## 2018-08-07 DIAGNOSIS — K7460 Unspecified cirrhosis of liver: Secondary | ICD-10-CM | POA: Diagnosis not present

## 2018-08-07 DIAGNOSIS — K209 Esophagitis, unspecified: Secondary | ICD-10-CM | POA: Diagnosis present

## 2018-08-07 DIAGNOSIS — K226 Gastro-esophageal laceration-hemorrhage syndrome: Secondary | ICD-10-CM | POA: Diagnosis present

## 2018-08-07 DIAGNOSIS — F1010 Alcohol abuse, uncomplicated: Secondary | ICD-10-CM | POA: Diagnosis not present

## 2018-08-07 DIAGNOSIS — F17210 Nicotine dependence, cigarettes, uncomplicated: Secondary | ICD-10-CM | POA: Diagnosis present

## 2018-08-07 DIAGNOSIS — B182 Chronic viral hepatitis C: Secondary | ICD-10-CM | POA: Diagnosis present

## 2018-08-07 DIAGNOSIS — C220 Liver cell carcinoma: Secondary | ICD-10-CM | POA: Diagnosis present

## 2018-08-07 DIAGNOSIS — K7031 Alcoholic cirrhosis of liver with ascites: Secondary | ICD-10-CM | POA: Diagnosis present

## 2018-08-07 DIAGNOSIS — R079 Chest pain, unspecified: Secondary | ICD-10-CM

## 2018-08-07 DIAGNOSIS — R188 Other ascites: Secondary | ICD-10-CM | POA: Diagnosis not present

## 2018-08-07 DIAGNOSIS — Z8505 Personal history of malignant neoplasm of liver: Secondary | ICD-10-CM

## 2018-08-07 DIAGNOSIS — I8510 Secondary esophageal varices without bleeding: Secondary | ICD-10-CM | POA: Diagnosis present

## 2018-08-07 DIAGNOSIS — K922 Gastrointestinal hemorrhage, unspecified: Secondary | ICD-10-CM | POA: Diagnosis present

## 2018-08-07 DIAGNOSIS — J45909 Unspecified asthma, uncomplicated: Secondary | ICD-10-CM | POA: Diagnosis present

## 2018-08-07 LAB — COMPREHENSIVE METABOLIC PANEL
ALK PHOS: 121 U/L (ref 38–126)
ALT: 49 U/L — AB (ref 0–44)
AST: 138 U/L — AB (ref 15–41)
Albumin: 2.5 g/dL — ABNORMAL LOW (ref 3.5–5.0)
Anion gap: 6 (ref 5–15)
BUN: 29 mg/dL — AB (ref 8–23)
CALCIUM: 8 mg/dL — AB (ref 8.9–10.3)
CO2: 22 mmol/L (ref 22–32)
CREATININE: 1 mg/dL (ref 0.61–1.24)
Chloride: 109 mmol/L (ref 98–111)
GFR calc Af Amer: >60 mL/min (ref >60)
Glucose, Bld: 108 mg/dL — ABNORMAL HIGH (ref 70–99)
Potassium: 3.8 mmol/L (ref 3.5–5.1)
Sodium: 137 mmol/L (ref 135–145)
Total Bilirubin: 3.5 mg/dL — ABNORMAL HIGH (ref 0.3–1.2)
Total Protein: 8 g/dL (ref 6.5–8.1)

## 2018-08-07 LAB — PROTIME-INR
INR: 1.37
Prothrombin Time: 16.7 seconds — ABNORMAL HIGH (ref 11.4–15.2)

## 2018-08-07 LAB — CBC
HEMATOCRIT: 33.8 % — AB (ref 39.0–52.0)
Hemoglobin: 10.9 g/dL — ABNORMAL LOW (ref 13.0–17.0)
MCH: 33 pg (ref 26.0–34.0)
MCHC: 32.2 g/dL (ref 30.0–36.0)
MCV: 102.4 fL — AB (ref 80.0–100.0)
PLATELETS: 115 10*3/uL — AB (ref 150–400)
RBC: 3.3 MIL/uL — ABNORMAL LOW (ref 4.22–5.81)
RDW: 13.6 % (ref 11.5–15.5)
WBC: 7.1 10*3/uL (ref 4.0–10.5)
nRBC: 0 % (ref 0.0–0.2)

## 2018-08-07 LAB — TYPE AND SCREEN
ABO/RH(D): O NEG
Antibody Screen: NEGATIVE

## 2018-08-07 LAB — POC OCCULT BLOOD, ED: Fecal Occult Bld: POSITIVE — AB

## 2018-08-07 MED ORDER — SODIUM CHLORIDE 0.9 % IV SOLN
2.0000 g | Freq: Every day | INTRAVENOUS | Status: DC
  Administered 2018-08-07 – 2018-08-09 (×3): 2 g via INTRAVENOUS
  Filled 2018-08-07: qty 20
  Filled 2018-08-07 (×3): qty 2

## 2018-08-07 MED ORDER — LORAZEPAM 2 MG/ML IJ SOLN: 2.0000 mg | INTRAMUSCULAR | Status: DC | PRN

## 2018-08-07 MED ORDER — OCTREOTIDE LOAD VIA INFUSION
50.0000 ug | Freq: Once | INTRAVENOUS | Status: AC
  Administered 2018-08-07: 50 ug via INTRAVENOUS
  Filled 2018-08-07: qty 25

## 2018-08-07 MED ORDER — ACETAMINOPHEN 325 MG PO TABS: 650.0000 mg | ORAL_TABLET | Freq: Four times a day (QID) | ORAL | Status: DC | PRN

## 2018-08-07 MED ORDER — SODIUM CHLORIDE 0.9 % IV SOLN
50.0000 ug/h | INTRAVENOUS | Status: DC
  Administered 2018-08-07 – 2018-08-09 (×4): 50 ug/h via INTRAVENOUS
  Filled 2018-08-07 (×6): qty 1

## 2018-08-07 MED ORDER — SODIUM CHLORIDE 0.9 % IV SOLN
80.0000 mg | Freq: Once | INTRAVENOUS | Status: AC
  Administered 2018-08-07: 80 mg via INTRAVENOUS
  Filled 2018-08-07: qty 80

## 2018-08-07 MED ORDER — THIAMINE HCL 100 MG/ML IJ SOLN
100.0000 mg | Freq: Every day | INTRAMUSCULAR | Status: DC
  Administered 2018-08-07 – 2018-08-10 (×4): 100 mg via INTRAVENOUS
  Filled 2018-08-07 (×3): qty 2

## 2018-08-07 MED ORDER — SODIUM CHLORIDE 0.9 % IV SOLN
INTRAVENOUS | Status: AC
  Administered 2018-08-07 – 2018-08-08 (×2): via INTRAVENOUS

## 2018-08-07 MED ORDER — ONDANSETRON HCL 4 MG/2ML IJ SOLN
4.0000 mg | Freq: Four times a day (QID) | INTRAMUSCULAR | Status: DC | PRN
  Administered 2018-08-07: 4 mg via INTRAVENOUS
  Filled 2018-08-07: qty 2

## 2018-08-07 MED ORDER — FOLIC ACID 5 MG/ML IJ SOLN
1.0000 mg | Freq: Every day | INTRAMUSCULAR | Status: DC
  Administered 2018-08-07 – 2018-08-10 (×4): 1 mg via INTRAVENOUS
  Filled 2018-08-07 (×4): qty 0.2

## 2018-08-07 MED ORDER — SODIUM CHLORIDE 0.9 % IV SOLN
8.0000 mg/h | INTRAVENOUS | Status: DC
  Administered 2018-08-07 – 2018-08-10 (×7): 8 mg/h via INTRAVENOUS
  Filled 2018-08-07 (×10): qty 80

## 2018-08-07 MED ORDER — ACETAMINOPHEN 650 MG RE SUPP: 650.0000 mg | Freq: Four times a day (QID) | RECTAL | Status: DC | PRN
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
## 2018-08-07 NOTE — ED Notes (Signed)
Patient vomited approximately 6 oz of bloody emesis and then had bloody bm in bedside commode. Order obtained for Zofran

## 2018-08-07 NOTE — ED Notes (Signed)
ED TO INPATIENT HANDOFF REPORT  Name/Age/Gender Peter Nelson 67 y.o. male  Code Status    Code Status Orders  (From admission, onward)         Start     Ordered   08/07/18 2112  Full code  Continuous     08/07/18 2112        Code Status History    Date Active Date Inactive Code Status Order ID Comments User Context   01/11/2017 1456 01/12/2017 2103 Full Code [id number]  Caren Griffins, MD Inpatient   12/29/2016 2014 01/01/2017 1525 Full Code [id number]  Vianne Bulls, MD ED   10/25/2016 2347 10/30/2016 1942 Full Code [id number]  Rise Patience, MD ED   10/25/2016 2249 10/25/2016 2346 Full Code [id number]  Varney Biles, MD ED   03/15/2016 2043 03/16/2016 1451 Full Code [id number]  Etta Quill, DO ED    Advance Directive Documentation     Most Recent Value  Type of Advance Directive  Healthcare Power of Attorney, Living will  Pre-existing out of facility DNR order (yellow form or pink MOST form)  -  "MOST" Form in Place?  -      Home/SNF/Other Home  Chief Complaint liver problems, pains in abdominal area  Level of Care/Admitting Diagnosis ED Disposition    ED Disposition Condition [place]: [place] [100102]  Level of Care: Stepdown [14]  Admit to SDU based on following criteria: Severe physiological/psychological symptoms:  Any diagnosis requiring assessment & intervention at least every 4 hours on an ongoing basis to obtain desired patient outcomes including stability and rehabilitation  Diagnosis: Acute GI bleeding [370488]  Admitting Physician: Rise Patience [phone]  Attending Physician: Rise Patience [[id number]]  PT Class (Do Not Modify): Observation [104]  PT Acc Code (Do Not Modify): Observation [10022]       Medical History Past Medical History:  Diagnosis Date  . Alcohol abuse   . Allergy   . Arthritis   . Asthma   . Cancer ([place])    liver  . Cirrhosis ([place])   . Dysrhythmia   . Hepatitis  C   . Hepatocellular carcinoma ([place])   . Hyperplastic colon polyp   . Hypertension   . Internal hemorrhoids   . Shortness of breath dyspnea    with activity and anxiety  . Ulcerative colitis ([place])   . Wears glasses     Allergies Allergies  Allergen Reactions  . Aspirin Other (See Comments)    Does not take because of hep c    IV Location/Drains/Wounds Patient Lines/Drains/Airways Status   Active Line/Drains/Airways    Name:   Placement date:   Placement time:   Site:   Days:   Peripheral IV 08/07/18 Right;Upper Arm   08/07/18    1848    Arm   less than 1   Peripheral IV 08/07/18 Right Forearm   08/07/18    2007    Forearm   less than 1          Labs/Imaging Results for orders placed or performed during the hospital encounter of 08/07/18 (from the past 48 hour(s))  Comprehensive metabolic panel     Status: Abnormal   Collection Time: 08/07/18  4:55 PM  Result Value Ref Range   Sodium 137 135 - 145 mmol/L   Potassium 3.8 3.5 - 5.1 mmol/L   Chloride 109 98 - 111 mmol/L   CO2 22 22 - 32  mmol/L   Glucose, Bld 108 (H) 70 - 99 mg/dL   BUN 29 (H) 8 - 23 mg/dL   Creatinine, Ser 1.00 0.61 - 1.24 mg/dL   Calcium 8.0 (L) 8.9 - 10.3 mg/dL   Total Protein 8.0 6.5 - 8.1 g/dL   Albumin 2.5 (L) 3.5 - 5.0 g/dL   AST 138 (H) 15 - 41 U/L   ALT 49 (H) 0 - 44 U/L   Alkaline Phosphatase 121 38 - 126 U/L   Total Bilirubin 3.5 (H) 0.3 - 1.2 mg/dL   GFR calc non Af Amer >60 >60 mL/min   GFR calc Af Amer >60 >60 mL/min    Comment: (NOTE) The eGFR has been calculated using the CKD EPI equation. This calculation has not been validated in all clinical situations. eGFR's persistently <60 mL/min signify possible Chronic Kidney Disease.    Anion gap 6 5 - 15    Comment: Performed at [facility], [place] [address] [place], [place] [address]  CBC     Status: Abnormal   Collection Time: 08/07/18  4:55 PM  Result Value Ref Range   WBC 7.1 4.0 - 10.5 K/uL   RBC 3.30 (L) 4.22  - 5.81 MIL/uL   Hemoglobin 10.9 (L) 13.0 - 17.0 g/dL   HCT 33.8 (L) 39.0 - 52.0 %   MCV 102.4 (H) 80.0 - 100.0 fL   MCH 33.0 26.0 - 34.0 pg   MCHC 32.2 30.0 - 36.0 g/dL   RDW 13.6 11.5 - 15.5 %   Platelets 115 (L) 150 - 400 K/uL    Comment: REPEATED TO VERIFY PLATELET COUNT CONFIRMED BY SMEAR SPECIMEN CHECKED FOR CLOTS Immature Platelet Fraction may be clinically indicated, consider ordering this additional test [id number]    nRBC 0.0 0.0 - 0.2 %    Comment: Performed at [facility], [place] [address], [place], [place] [address]  Type and screen [place]     Status: None   Collection Time: 08/07/18  4:56 PM  Result Value Ref Range   ABO/RH(D) O NEG    Antibody Screen NEG    Sample Expiration      08/10/2018 Performed at [facility], [place] [address], [place], [place] [address]   Protime-INR     Status: Abnormal   Collection Time: 08/07/18  6:44 PM  Result Value Ref Range   Prothrombin Time 16.7 (H) 11.4 - 15.2 seconds   INR 1.37     Comment: Performed at [facility], [place] [address], [place], [place] [address]  POC occult blood, ED     Status: Abnormal   Collection Time: 08/07/18  6:55 PM  Result Value Ref Range   Fecal Occult Bld POSITIVE (A) NEGATIVE   Dg Chest Port 1 View  Result Date: 08/07/2018 CLINICAL DATA:  Chest pain EXAM: PORTABLE CHEST 1 VIEW COMPARISON:  04/21/2017 FINDINGS: Heart is borderline in size. No confluent airspace opacities or effusions. No acute bony abnormality. IMPRESSION: Borderline cardiomegaly.  No active disease. Electronically Signed   By: Rolm Baptise M.D.   On: 08/07/2018 21:42    Pending Labs Unresulted Labs (From admission, onward)    Start     Ordered   08/08/18 3903  Basic metabolic panel  Tomorrow morning,   R     08/07/18 2112   08/08/18 0500  Hepatic function panel  Tomorrow morning,   R     08/07/18 2112   08/07/18 2125  Troponin  I  Once,   R      08/07/18 2124   08/07/18 2112  CBC  Now then every 4 hours,   R     08/07/18 2112   08/07/18 2110  HIV antibody (Routine Testing)  Once,   R     08/07/18 2112          Vitals/Pain Today's Vitals   08/07/18 2200 08/07/18 2215 08/07/18 2230 08/07/18 2245  BP: (!) 148/82 (!) 149/80 (!) 141/80 (!) 144/67  Pulse: 87 84 85 91  Resp: 16 14 16 14   Temp:      TempSrc:      SpO2: 98% 98% 97% 98%  Weight:      Height:      PainSc:        Isolation Precautions No active isolations  Medications Medications  pantoprazole (PROTONIX) 80 mg in sodium chloride 0.9 % 250 mL (0.32 mg/mL) infusion (8 mg/hr Intravenous Transfusing/Transfer 08/07/18 2053)  octreotide (SANDOSTATIN) 2 mcg/mL load via infusion 50 mcg (50 mcg Intravenous Bolus from Bag 08/07/18 2008)    And  octreotide (SANDOSTATIN) 500 mcg in sodium chloride 0.9 % 250 mL (2 mcg/mL) infusion (50 mcg/hr Intravenous Transfusing/Transfer 08/07/18 2053)  ondansetron (ZOFRAN) injection 4 mg (4 mg Intravenous Given 08/07/18 2035)  acetaminophen (TYLENOL) tablet 650 mg (has no administration in time range)    Or  acetaminophen (TYLENOL) suppository 650 mg (has no administration in time range)  LORazepam (ATIVAN) injection 2-3 mg (has no administration in time range)  folic acid injection 1 mg (has no administration in time range)  thiamine (B-1) injection 100 mg (has no administration in time range)  0.9 %  sodium chloride infusion (has no administration in time range)  cefTRIAXone (ROCEPHIN) 2 g in sodium chloride 0.9 % 100 mL IVPB (has no administration in time range)  pantoprazole (PROTONIX) 80 mg in sodium chloride 0.9 % 100 mL IVPB ( Intravenous Stopped 08/07/18 1944)    Mobility walks with person assist
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
## 2018-08-07 NOTE — ED Notes (Signed)
Attempted to call report to [facility], still in [place], unable to take report at this time

## 2018-08-07 NOTE — ED Notes (Signed)
Peter Nelson [id number]   Call for updates please

## 2018-08-07 NOTE — ED Notes (Signed)
Attempted to call report to [phone], no answer at this time

## 2018-08-07 NOTE — ED Triage Notes (Signed)
Patient c/o mid  Abdominal pain and black diarrhea x 2 days.

## 2018-08-08 ENCOUNTER — Observation Stay (HOSPITAL_COMMUNITY): Payer: Medicare HMO | Admitting: Certified Registered"

## 2018-08-08 ENCOUNTER — Encounter (HOSPITAL_COMMUNITY): Payer: Self-pay | Admitting: Certified Registered"

## 2018-08-08 ENCOUNTER — Encounter (HOSPITAL_COMMUNITY)
Admission: EM | Disposition: A | Discharge status: Home / Self Care | Payer: Self-pay | Source: Home / Self Care | Attending: Internal Medicine

## 2018-08-08 DIAGNOSIS — K766 Portal hypertension: Secondary | ICD-10-CM | POA: Diagnosis present

## 2018-08-08 DIAGNOSIS — R0789 Other chest pain: Secondary | ICD-10-CM | POA: Diagnosis not present

## 2018-08-08 DIAGNOSIS — I10 Essential (primary) hypertension: Secondary | ICD-10-CM | POA: Diagnosis present

## 2018-08-08 DIAGNOSIS — R079 Chest pain, unspecified: Secondary | ICD-10-CM | POA: Diagnosis present

## 2018-08-08 DIAGNOSIS — K209 Esophagitis, unspecified: Secondary | ICD-10-CM | POA: Diagnosis present

## 2018-08-08 DIAGNOSIS — Z8505 Personal history of malignant neoplasm of liver: Secondary | ICD-10-CM | POA: Diagnosis not present

## 2018-08-08 DIAGNOSIS — F17210 Nicotine dependence, cigarettes, uncomplicated: Secondary | ICD-10-CM | POA: Diagnosis present

## 2018-08-08 DIAGNOSIS — D62 Acute posthemorrhagic anemia: Secondary | ICD-10-CM | POA: Diagnosis present

## 2018-08-08 DIAGNOSIS — B182 Chronic viral hepatitis C: Secondary | ICD-10-CM

## 2018-08-08 DIAGNOSIS — F1020 Alcohol dependence, uncomplicated: Secondary | ICD-10-CM | POA: Diagnosis present

## 2018-08-08 DIAGNOSIS — C220 Liver cell carcinoma: Secondary | ICD-10-CM | POA: Diagnosis present

## 2018-08-08 DIAGNOSIS — K7031 Alcoholic cirrhosis of liver with ascites: Secondary | ICD-10-CM | POA: Diagnosis present

## 2018-08-08 DIAGNOSIS — I8501 Esophageal varices with bleeding: Secondary | ICD-10-CM | POA: Diagnosis not present

## 2018-08-08 DIAGNOSIS — Z79899 Other long term (current) drug therapy: Secondary | ICD-10-CM | POA: Diagnosis not present

## 2018-08-08 DIAGNOSIS — Z886 Allergy status to analgesic agent status: Secondary | ICD-10-CM | POA: Diagnosis not present

## 2018-08-08 DIAGNOSIS — K920 Hematemesis: Secondary | ICD-10-CM | POA: Diagnosis not present

## 2018-08-08 DIAGNOSIS — K226 Gastro-esophageal laceration-hemorrhage syndrome: Secondary | ICD-10-CM

## 2018-08-08 DIAGNOSIS — F1010 Alcohol abuse, uncomplicated: Secondary | ICD-10-CM | POA: Diagnosis not present

## 2018-08-08 DIAGNOSIS — Z9114 Patient's other noncompliance with medication regimen: Secondary | ICD-10-CM | POA: Diagnosis not present

## 2018-08-08 DIAGNOSIS — K922 Gastrointestinal hemorrhage, unspecified: Secondary | ICD-10-CM | POA: Diagnosis not present

## 2018-08-08 DIAGNOSIS — I8510 Secondary esophageal varices without bleeding: Secondary | ICD-10-CM | POA: Diagnosis present

## 2018-08-08 DIAGNOSIS — K317 Polyp of stomach and duodenum: Secondary | ICD-10-CM | POA: Diagnosis present

## 2018-08-08 DIAGNOSIS — J45909 Unspecified asthma, uncomplicated: Secondary | ICD-10-CM | POA: Diagnosis present

## 2018-08-08 HISTORY — PX: ESOPHAGOGASTRODUODENOSCOPY (EGD) WITH PROPOFOL: SHX5813

## 2018-08-08 LAB — CBC
HCT: 26.6 % — ABNORMAL LOW (ref 39.0–52.0)
HEMATOCRIT: 26.6 % — AB (ref 39.0–52.0)
HEMATOCRIT: 27.2 % — AB (ref 39.0–52.0)
HEMOGLOBIN: 8.8 g/dL — AB (ref 13.0–17.0)
Hemoglobin: 8.6 g/dL — ABNORMAL LOW (ref 13.0–17.0)
Hemoglobin: 8.8 g/dL — ABNORMAL LOW (ref 13.0–17.0)
MCH: 33.3 pg (ref 26.0–34.0)
MCH: 33.5 pg (ref 26.0–34.0)
MCH: 34.4 pg — AB (ref 26.0–34.0)
MCHC: 32.3 g/dL (ref 30.0–36.0)
MCHC: 33.1 g/dL (ref 30.0–36.0)
MCHC: 32.4 g/dL (ref 30.0–36.0)
MCV: 103.1 fL — ABNORMAL HIGH (ref 80.0–100.0)
MCV: 103.9 fL — ABNORMAL HIGH (ref 80.0–100.0)
MCV: 103.4 fL — AB (ref 80.0–100.0)
NRBC: 0 % (ref 0.0–0.2)
NRBC: 0 % (ref 0.0–0.2)
PLATELETS: 75 10*3/uL — AB (ref 150–400)
PLATELETS: 77 10*3/uL — AB (ref 150–400)
Platelets: 81 10*3/uL — ABNORMAL LOW (ref 150–400)
RBC: 2.56 MIL/uL — AB (ref 4.22–5.81)
RBC: 2.58 MIL/uL — AB (ref 4.22–5.81)
RBC: 2.63 MIL/uL — ABNORMAL LOW (ref 4.22–5.81)
RDW: 13.8 % (ref 11.5–15.5)
RDW: 13.6 % (ref 11.5–15.5)
RDW: 13.7 % (ref 11.5–15.5)
WBC: 8.1 10*3/uL (ref 4.0–10.5)
WBC: 8.5 10*3/uL (ref 4.0–10.5)
WBC: 9.4 10*3/uL (ref 4.0–10.5)
nRBC: 0 % (ref 0.0–0.2)

## 2018-08-08 LAB — BASIC METABOLIC PANEL
Anion gap: 3 — ABNORMAL LOW (ref 5–15)
BUN: 28 mg/dL — ABNORMAL HIGH (ref 8–23)
CHLORIDE: 115 mmol/L — AB (ref 98–111)
CO2: 22 mmol/L (ref 22–32)
Calcium: 7.6 mg/dL — ABNORMAL LOW (ref 8.9–10.3)
Creatinine, Ser: 0.92 mg/dL (ref 0.61–1.24)
GFR calc non Af Amer: >60 mL/min (ref >60)
Glucose, Bld: 92 mg/dL (ref 70–99)
Potassium: 3.9 mmol/L (ref 3.5–5.1)
SODIUM: 140 mmol/L (ref 135–145)

## 2018-08-08 LAB — TROPONIN I
TROPONIN I: 0.06 ng/mL — AB (ref <0.03)
Troponin I: 0.04 ng/mL (ref <0.03)

## 2018-08-08 LAB — HEPATIC FUNCTION PANEL
ALT: 41 U/L (ref 0–44)
AST: 108 U/L — AB (ref 15–41)
Albumin: 2.1 g/dL — ABNORMAL LOW (ref 3.5–5.0)
Alkaline Phosphatase: 101 U/L (ref 38–126)
BILIRUBIN INDIRECT: 1.7 mg/dL — AB (ref 0.3–0.9)
Bilirubin, Direct: 1.1 mg/dL — ABNORMAL HIGH (ref 0.0–0.2)
TOTAL PROTEIN: 6.6 g/dL (ref 6.5–8.1)
Total Bilirubin: 2.8 mg/dL — ABNORMAL HIGH (ref 0.3–1.2)

## 2018-08-08 LAB — HIV ANTIBODY (ROUTINE TESTING W REFLEX): HIV Screen 4th Generation wRfx: NONREACTIVE

## 2018-08-08 LAB — MRSA PCR SCREENING: MRSA BY PCR: POSITIVE — AB

## 2018-08-08 SURGERY — ESOPHAGOGASTRODUODENOSCOPY (EGD) WITH PROPOFOL
Anesthesia: Monitor Anesthesia Care

## 2018-08-08 MED ORDER — MUPIROCIN 2 % EX OINT
1.0000 "application " | TOPICAL_OINTMENT | Freq: Two times a day (BID) | CUTANEOUS | Status: DC
  Administered 2018-08-08 – 2018-08-10 (×5): 1 via NASAL
  Filled 2018-08-08: qty 22

## 2018-08-08 MED ORDER — CHLORHEXIDINE GLUCONATE CLOTH 2 % EX PADS
6.0000 | MEDICATED_PAD | Freq: Every day | CUTANEOUS | Status: DC
  Administered 2018-08-08 – 2018-08-10 (×3): 6 via TOPICAL

## 2018-08-08 MED ORDER — LIDOCAINE 2% (20 MG/ML) 5 ML SYRINGE
INTRAMUSCULAR | Status: DC | PRN
  Administered 2018-08-08: 40 mg via INTRAVENOUS

## 2018-08-08 MED ORDER — PROPOFOL 10 MG/ML IV BOLUS
INTRAVENOUS | Status: DC | PRN
  Administered 2018-08-08 (×3): 20 mg via INTRAVENOUS
  Administered 2018-08-08 (×2): 40 mg via INTRAVENOUS
  Administered 2018-08-08 (×2): 20 mg via INTRAVENOUS
  Administered 2018-08-08: 40 mg via INTRAVENOUS
  Administered 2018-08-08 (×6): 20 mg via INTRAVENOUS

## 2018-08-08 MED ORDER — LACTATED RINGERS IV SOLN
INTRAVENOUS | Status: DC | PRN
  Administered 2018-08-08: 11:00:00 via INTRAVENOUS

## 2018-08-08 MED ORDER — PROPOFOL 10 MG/ML IV BOLUS
INTRAVENOUS | Status: AC
  Filled 2018-08-08: qty 60

## 2018-08-08 SURGICAL SUPPLY — 15 items

## 2018-08-08 NOTE — Progress Notes (Signed)
CRITICAL VALUE ALERT  Critical Value:  MRSA +  Date & Time Notied:  08/08/2018, 0057  Provider Notified: Raliegh Ip Schorr  Orders Received/Actions taken: Awaiting orders
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
## 2018-08-08 NOTE — Progress Notes (Signed)
CRITICAL VALUE ALERT  Critical Value:  Troponin 0.06  Date & Time Notied:  08/08/18 8006  Provider Notified: Havery Moros  Orders Received/Actions taken: MD aware

## 2018-08-08 NOTE — Anesthesia Postprocedure Evaluation (Signed)
Anesthesia Post Note  Patient: Peter Nelson  Procedure(s) Performed: ESOPHAGOGASTRODUODENOSCOPY (EGD) WITH PROPOFOL ([name] ) HEMOSTASIS CONTROL     Patient location during evaluation: Endoscopy Anesthesia Type: MAC Level of consciousness: awake and alert Pain management: pain level controlled Vital Signs Assessment: post-procedure vital signs reviewed and stable Respiratory status: spontaneous breathing, nonlabored ventilation, respiratory function stable and patient connected to nasal cannula oxygen Cardiovascular status: blood pressure returned to baseline and stable Postop Assessment: no apparent nausea or vomiting Anesthetic complications: no    Last Vitals:  Vitals:   08/08/18 1220 08/08/18 1619  BP: (!) 172/74 (!) 172/82  Pulse: 72   Resp: 12 12  Temp:  36.8 C  SpO2: 100%     Last Pain:  Vitals:   08/08/18 1619  TempSrc: Oral  PainSc:                  Abbygail [place] L Andreyah Natividad

## 2018-08-08 NOTE — Transfer of Care (Signed)
Immediate Anesthesia Transfer of Care Note  Patient: Peter Nelson  Procedure(s) Performed: ESOPHAGOGASTRODUODENOSCOPY (EGD) WITH PROPOFOL ([name] )  Patient Location: PACU and Endoscopy Unit  Anesthesia Type:MAC  Level of Consciousness: sedated  Airway & Oxygen Therapy: Patient Spontanous Breathing and Patient connected to nasal cannula oxygen  Post-op Assessment: Report given to RN and Post -op Vital signs reviewed and stable  Post vital signs: Reviewed and stable  Last Vitals:  Vitals Value Taken Time  BP    Temp    Pulse    Resp    SpO2      Last Pain:  Vitals:   08/08/18 1033  TempSrc: Oral  PainSc: 0-No pain         Complications: No apparent anesthesia complications

## 2018-08-08 NOTE — Progress Notes (Signed)
Pt. Requesting foods. Taking he will leave if he can not have something to drink. MD notified. Waiting response.
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
## 2018-08-08 NOTE — Anesthesia Procedure Notes (Signed)
Procedure Name: MAC Date/Time: 08/08/2018 11:02 AM Performed by: Cynda Familia, CRNA Pre-anesthesia Checklist: Patient identified, Emergency Drugs available, Patient being monitored, Timeout performed and Suction available Patient Re-evaluated:Patient Re-evaluated prior to induction Oxygen Delivery Method: Nasal cannula Placement Confirmation: positive ETCO2 Dental Injury: Teeth and Oropharynx as per pre-operative assessment  Comments: Bite block by tech
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
## 2018-08-08 NOTE — Op Note (Signed)
[facility] Patient Name: Peter Nelson Procedure Date: 08/08/2018 MRN: [id number] Attending MD: Carlota Raspberry. Havery Moros , MD Date of Birth: 07/11/1951 CSN: [id number] Age: 67 Admit Type: Inpatient Procedure:                Upper GI endoscopy Indications:              Suspected upper gastrointestinal bleeding - history                            of cirrhosis with variceal bleeding in the past s/p                            banding, now with hematemesis and dark stools. Providers:                Carlota Raspberry. Havery Moros, MD, Cleda Daub, RN,                            Alan Mulder, Technician, Glenis Smoker, CRNA Referring MD:              Medicines:                Monitored Anesthesia Care Complications:            No immediate complications. Estimated blood loss:                            Minimal. Estimated Blood Loss:     Estimated blood loss was minimal. Procedure:                Pre-Anesthesia Assessment:                           - Prior to the procedure, a History and Physical                            was performed, and patient medications and                            allergies were reviewed. The patient's tolerance of                            previous anesthesia was also reviewed. The risks                            and benefits of the procedure and the sedation                            options and risks were discussed with the patient.                            All questions were answered, and informed consent                            was obtained. Prior Anticoagulants: The patient has  taken no previous anticoagulant or antiplatelet                            agents. ASA Grade Assessment: III - A patient with                            severe systemic disease. After reviewing the risks                            and benefits, the patient was deemed in                            satisfactory condition to undergo the  procedure.                           After obtaining informed consent, the endoscope was                            passed under direct vision. Throughout the                            procedure, the patient's blood pressure, pulse, and                            oxygen saturations were monitored continuously. The                            GIF-H190 ([id number] Olympus adult endoscope was                            introduced through the mouth, and advanced to the                            second part of duodenum. The upper GI endoscopy was                            accomplished without difficulty. The patient                            tolerated the procedure well. Scope In: Scope Out: Findings:      Esophagogastric landmarks were identified: the Z-line was found at 40       cm, the gastroesophageal junction was found at 40 cm and the upper       extent of the gastric folds was found at 40 cm from the incisors.      A large bleeding Mallory-Weiss tear was found at the GEJ with active       oozing. For hemostasis, five hemostatic clips were placed across it to       achieve hemostasis.      Esophagitis was found in the distal esophagus with some old adherent       heme, no active bleeding.      Small varices were found in the lower third of the esophagus but       deflated with insufflation. The varices seemed less likely  to be the       cause of bleeding symptoms. Stigmata / scarring from prior banding noted.      Focal mucosal changes were found in the lower third of the esophagus       just proximal to the GEJ, across the lumen from where the Clark       was noted. Hypopigmented area with altered vascularity, could be due to       scarring from prior banding versus other. Biopsies not taken given       active bleeding noted.      The exam of the esophagus was otherwise normal.      The entire examined stomach was normal. No gastric varices appreciated.      A single small  sessile polyp was found in the second portion of the       duodenum. Biopsies not taken given active bleeding.      The exam of the duodenum was otherwise normal. Impression:               - Esophagogastric landmarks identified.                           - Large Mallory-Weiss tear as outlined above. Clips                            were placed.                           - Esophagitis in the lower esophagus.                           - Small esophageal varices with stigmata of prior                            banding, deflated with insufflation.                           - Altered esophageal mucosa as outlined                           - Normal stomach.                           - A single duodenal polyp - not biopsied..                           - No specimens collected. Moderate Sedation:      No moderate sedation, case performed with MAC Recommendation:           - Return patient to ICU for ongoing care.                           - NPO for now given recent endoscopic therapy.                           - Continue present medications.                           - Serial  Hgb                           - Consideration for follow up EGD in several weeks                            to re-evaluate esophageal mucosa and duodenal                            polyp, if the patient is compliant with follow up                           - Complete alcohol abstinence                           - GI service will continue to follow Procedure Code(s):        --- Professional ---                           [phone], Esophagogastroduodenoscopy, flexible,                            transoral; with control of bleeding, any method Diagnosis Code(s):        --- Professional ---                           K22.6, Gastro-esophageal laceration-hemorrhage                            syndrome                           K20.9, Esophagitis, unspecified                           I85.00, Esophageal varices without bleeding                            K31.7, Polyp of stomach and duodenum CPT copyright 2018 [employer]. All rights reserved. The codes documented in this report are preliminary and upon coder review may  be revised to meet current compliance requirements. Remo Lipps P. Armbruster, MD 08/08/2018 12:01:07 PM This report has been signed electronically. Number of Addenda: 0

## 2018-08-08 NOTE — Progress Notes (Addendum)
PROGRESS NOTE  Peter Nelson WYO:[id number] DOB: 01-29-1951 DOA: 08/07/2018 PCP: Patient, No Pcp Per  HPI/Brief Narrative  Peter Nelson is a 67 y.o. year old male with medical history significant for alcoholic cirrhosis, HCC, hepatitis C, and significant alcohol abuse who presented on 08/07/2018 with multiple episodes of vomiting blood and dark stool for 2 days and was found to have acute blood loss anemia with concern for upper GI bleed most likely esophageal variceal.  Subjective Has some chest discomfort this morning which resolved on its own.  Some slight belly pain but no other acute complaints  Assessment/Plan:  #Acute blood loss anemia and hematemesis, suspect likely esophageal variceal bleed. Hx of esophageal varices in 2018 requiring band, non adherent to nadolol at home and still actively drinking. Gastric ulcer also on differential. N.p.o. for EGD by GI today.  Continue octreotide and Protonix infusions.  Monitor CBC.  Continue IV ceftriaxone  #Chest discomfort.  Description seems more consistent with upper abdominal pain which is likely related to above problem.  Very possible patient may have an ulcer, will be evaluated by EGD today.  Trending troponin, EKG nonischemic.  #Hepatic cirrhosis, no ascites on exam hepatic encephalopathy.  Patient nonadherent home Lasix and spironolactone.  Has not maintained GI follow-up as outpatient.  Monitor volume status and serial abdominal exams. HCV RNA  #Alcohol abuse, active.  Admits drinking at least half of the great day.  Monitor on CIWA protocol. Thiamine supplementation. Discussed importance of alcohol cessation  #HCC. GI reccomends MRI liver during hospitalization to ensure no recurrence of HCC, AFP level.    Code Status: Full code   Family Communication: ex wife at bedside   Disposition Plan: NPO for egd    Consultants:   Treatment Team:   Mansouraty, Telford Nab., MD    Procedures:  EGD pending    Antimicrobials: Anti-infectives (From admission, onward)   Start     Dose/Rate Route Frequency Ordered Stop   08/07/18 2215  cefTRIAXone (ROCEPHIN) 2 g in sodium chloride 0.9 % 100 mL IVPB     2 g 200 mL/hr over 30 Minutes Intravenous Daily at bedtime 08/07/18 2125           Cultures:  none  Telemetry:no  DVT prophylaxis:  SCDs   Objective: Vitals:   08/08/18 0400 08/08/18 0500 08/08/18 0600 08/08/18 0755  BP: (!) 167/71 (!) 156/78 (!) 158/68   Pulse: 82  82   Resp: 14 14 14    Temp:    98.6 F (37 C)  TempSrc:    Oral  SpO2: 95% 95% 98%   Weight:      Height:        Intake/Output Summary (Last 24 hours) at 08/08/2018 0835 Last data filed at 08/08/2018 0539 Gross per 24 hour  Intake 954.2 ml  Output 150 ml  Net 804.2 ml   Filed Weights   08/07/18 1639 08/08/18 0259  Weight: 61.2 kg 54.7 kg    Exam:  Constitutional:elderyly male, no distress Eyes: EOMI, normal conjunctivae ENMT: Oropharynx with moist mucous membranes, Cardiovascular: RRR no MRGs, with no peripheral edema Respiratory: Normal respiratory effort on room air, clear breath sounds  Abdomen: Soft, slightly tender with deep palpation of lower abdomen, no ascites Skin: No rash ulcers, or lesions. Without skin tenting  Neurologic: Grossly no focal neuro deficit.No asterixis Psychiatric:Appropriate affect, and mood. Mental status AAOx3  Data Reviewed: CBC: Recent Labs  Lab 08/07/18 1655 08/07/18 2336 08/08/18 0315 08/08/18 0739  WBC 7.1  8.1 8.5 9.4  HGB 10.9* 8.8* 8.6* 8.8*  HCT 33.8* 27.2* 26.6* 26.6*  MCV 102.4* 103.4* 103.1* 103.9*  PLT 115* 81* 75* 77*   Basic Metabolic Panel: Recent Labs  Lab 08/07/18 1655 08/08/18 0739  NA 137 140  K 3.8 3.9  CL 109 115*  CO2 22 22  GLUCOSE 108* 92  BUN 29* 28*  CREATININE 1.00 0.92  CALCIUM 8.0* 7.6*   GFR: Estimated Creatinine Clearance: 60.3 mL/min (by C-G formula based on SCr of 0.92 mg/dL). Liver Function Tests: Recent Labs   Lab 08/07/18 1655 08/08/18 0739  AST 138* 108*  ALT 49* 41  ALKPHOS 121 101  BILITOT 3.5* 2.8*  PROT 8.0 6.6  ALBUMIN 2.5* 2.1*   No results for input(s): LIPASE, AMYLASE in the last 168 hours. No results for input(s): AMMONIA in the last 168 hours. Coagulation Profile: Recent Labs  Lab 08/07/18 1844  INR 1.37   Cardiac Enzymes: Recent Labs  Lab 08/07/18 2336  TROPONINI 0.04*   BNP (last 3 results) No results for input(s): PROBNP in the last 8760 hours. HbA1C: No results for input(s): HGBA1C in the last 72 hours. CBG: No results for input(s): GLUCAP in the last 168 hours. Lipid Profile: No results for input(s): CHOL, HDL, LDLCALC, TRIG, CHOLHDL, LDLDIRECT in the last 72 hours. Thyroid Function Tests: No results for input(s): TSH, T4TOTAL, FREET4, T3FREE, THYROIDAB in the last 72 hours. Anemia Panel: No results for input(s): VITAMINB12, FOLATE, FERRITIN, TIBC, IRON, RETICCTPCT in the last 72 hours. Urine analysis:    Component Value Date/Time   COLORURINE YELLOW 04/20/2017 2340   APPEARANCEUR CLEAR 04/20/2017 2340   LABSPEC 1.020 05/04/2017 0914   PHURINE 7.0 05/04/2017 0914   GLUCOSEU NEGATIVE 05/04/2017 0914   HGBUR MODERATE (A) 05/04/2017 0914   BILIRUBINUR SMALL (A) 05/04/2017 0914   KETONESUR NEGATIVE 05/04/2017 0914   PROTEINUR 100 (A) 05/04/2017 0914   UROBILINOGEN >=8.0 05/04/2017 0914   NITRITE POSITIVE (A) 05/04/2017 0914   LEUKOCYTESUR SMALL (A) 05/04/2017 0914   Sepsis Labs: @LABRCNTIP (procalcitonin:4,lacticidven:4)  ) Recent Results (from the past 240 hour(s))  MRSA PCR Screening     Status: Abnormal   Collection Time: 08/07/18 11:11 PM  Result Value Ref Range Status   MRSA by PCR POSITIVE (A) NEGATIVE Final    Comment:        The GeneXpert MRSA Assay (FDA approved for NASAL specimens only), is one component of a comprehensive MRSA colonization surveillance program. It is not intended to diagnose MRSA infection nor to guide or monitor  treatment for MRSA infections. RESULT CALLED TO, READ BACK BY AND VERIFIED WITHJohnette Abraham MATHI RN [id number] 08/08/18 A NAVARRO Performed at [facility], [place] [address], [place], [place] [address]       Studies: Dg Chest Port 1 View  Result Date: 08/07/2018 CLINICAL DATA:  Chest pain EXAM: PORTABLE CHEST 1 VIEW COMPARISON:  04/21/2017 FINDINGS: Heart is borderline in size. No confluent airspace opacities or effusions. No acute bony abnormality. IMPRESSION: Borderline cardiomegaly.  No active disease. Electronically Signed   By: Rolm Baptise M.D.   On: 08/07/2018 21:42    Scheduled Meds: . Chlorhexidine Gluconate Cloth  6 each Topical Q0600  . folic acid  1 mg Intravenous Daily  . mupirocin ointment  1 application Nasal BID  . thiamine injection  100 mg Intravenous Daily    Continuous Infusions: . sodium chloride 50 mL/hr at 08/08/18 0539  . cefTRIAXone (ROCEPHIN)  IV Stopped (08/08/18 0022)  .  octreotide  (SANDOSTATIN)    IV infusion 50 mcg/hr (08/08/18 0539)  . pantoprozole (PROTONIX) infusion 8 mg/hr (08/08/18 0539)     LOS: 0 days     Desiree Hane, MD [facility] Pager [phone]  If 7PM-7AM, please contact night-coverage [email or web address] Password [facility] 08/08/2018, 8:35 AM

## 2018-08-08 NOTE — Progress Notes (Signed)
CRITICAL VALUE ALERT  Critical Value:  Troponin 0.04  Date & Time Notied:  08/08/2018, 0024  Provider Notified: Raliegh Ip Schorr  Orders Received/Actions taken: Awaiting orders

## 2018-08-08 NOTE — Interval H&P Note (Signed)
History and Physical Interval Note:  08/08/2018 10:53 AM  Peter Nelson  has presented today for surgery, with the diagnosis of upper GI bleed  The various methods of treatment have been discussed with the patient and family. After consideration of risks, benefits and other options for treatment, the patient has consented to  Procedure(s): ESOPHAGOGASTRODUODENOSCOPY (EGD) WITH PROPOFOL ([name]) as a surgical intervention .  The patient's history has been reviewed, patient examined, no change in status, stable for surgery.  I have reviewed the patient's chart and labs.  Questions were answered to the patient's satisfaction.     [place]

## 2018-08-08 NOTE — Anesthesia Preprocedure Evaluation (Addendum)
Anesthesia Evaluation  Patient identified by MRN, date of birth, ID band Patient awake    Reviewed: Allergy & Precautions, NPO status , Patient's Chart, lab work & pertinent test results  Airway Mallampati: II   Neck ROM: Full    Dental  (+) Dental Advisory Given, Loose, Chipped, Missing,    Pulmonary asthma , Current Smoker,    breath sounds clear to auscultation       Cardiovascular hypertension,  Rhythm:Regular Rate:Normal     Neuro/Psych negative neurological ROS  negative psych ROS   GI/Hepatic (+) Cirrhosis     substance abuse  alcohol use, Hepatitis -, C  Endo/Other  negative endocrine ROS  Renal/GU negative Renal ROS  negative genitourinary   Musculoskeletal  (+) Arthritis ,   Abdominal   Peds  Hematology negative hematology ROS (+)   Anesthesia Other Findings UGIB, last drink yesterday morning  Reproductive/Obstetrics                          Anesthesia Physical Anesthesia Plan  ASA: III  Anesthesia Plan: MAC   Post-op Pain Management:    Induction: Intravenous  PONV Risk Score and Plan: 1 and Treatment may vary due to age or medical condition and Propofol infusion  Airway Management Planned: Natural Airway and Simple Face Mask  Additional Equipment:   Intra-op Plan:   Post-operative Plan:   Informed Consent: I have reviewed the patients History and Physical, chart, labs and discussed the procedure including the risks, benefits and alternatives for the proposed anesthesia with the patient or authorized representative who has indicated his/her understanding and acceptance.   Dental advisory given  Plan Discussed with: CRNA  Anesthesia Plan Comments:        Anesthesia Quick Evaluation

## 2018-08-09 ENCOUNTER — Inpatient Hospital Stay (HOSPITAL_COMMUNITY): Payer: Medicare HMO

## 2018-08-09 ENCOUNTER — Encounter (HOSPITAL_COMMUNITY): Payer: Self-pay | Admitting: Gastroenterology

## 2018-08-09 DIAGNOSIS — Z8505 Personal history of malignant neoplasm of liver: Secondary | ICD-10-CM

## 2018-08-09 DIAGNOSIS — K226 Gastro-esophageal laceration-hemorrhage syndrome: Principal | ICD-10-CM

## 2018-08-09 DIAGNOSIS — K7030 Alcoholic cirrhosis of liver without ascites: Secondary | ICD-10-CM

## 2018-08-09 DIAGNOSIS — K922 Gastrointestinal hemorrhage, unspecified: Secondary | ICD-10-CM

## 2018-08-09 LAB — CBC
HCT: 25.6 % — ABNORMAL LOW (ref 39.0–52.0)
HEMOGLOBIN: 8.1 g/dL — AB (ref 13.0–17.0)
MCH: 33.5 pg (ref 26.0–34.0)
MCHC: 31.6 g/dL (ref 30.0–36.0)
MCV: 105.8 fL — ABNORMAL HIGH (ref 80.0–100.0)
PLATELETS: 77 10*3/uL — AB (ref 150–400)
RBC: 2.42 MIL/uL — ABNORMAL LOW (ref 4.22–5.81)
RDW: 13.5 % (ref 11.5–15.5)
WBC: 5.6 10*3/uL (ref 4.0–10.5)
nRBC: 0 % (ref 0.0–0.2)

## 2018-08-09 LAB — BASIC METABOLIC PANEL
Anion gap: 6 (ref 5–15)
BUN: 23 mg/dL (ref 8–23)
CALCIUM: 7.6 mg/dL — AB (ref 8.9–10.3)
CHLORIDE: 110 mmol/L (ref 98–111)
CO2: 22 mmol/L (ref 22–32)
Creatinine, Ser: 1.06 mg/dL (ref 0.61–1.24)
GFR calc non Af Amer: >60 mL/min (ref >60)
Glucose, Bld: 90 mg/dL (ref 70–99)
Potassium: 3.4 mmol/L — ABNORMAL LOW (ref 3.5–5.1)
SODIUM: 138 mmol/L (ref 135–145)

## 2018-08-09 NOTE — Progress Notes (Signed)
[facility] Progress Note   Chief Complaint:   GI bleed  SUBJECTIVE:    no further bleeding. Feels okay   ASSESSMENT AND PLAN:   1. HCV cirrhosis with portal HTN / [place] s/p ablation 2015. Admitted with upper GI bleed from [facility] tear.  -will d/c Octreotide.  -transition to PO BID PPI tomorrow.  -May need repeat EGD in several weeks to reevaluate esophageal mucosa and duodenal polyp -given hx of HCC will obtain liver doppler study while inpatient -advance to fulls  EGD yesterday  large Mallory -Weiss tear at [facility] with active oozing, s/p clip placement x 5.   distal esophagitis with old adherent heme / small varices with scarring from prior banding.  focal mucosal changes in the lower third of the esophagus just proximal to the GEJ, across the lumen from where the M-W tear was noted.    Small sessile polyp in D2. Not removed and not biopsied  2. ETOH abuse -obviously needs to avoid  OBJECTIVE:   Vital signs in last 24 hours: Temp:  [98.3 F (36.8 C)-98.8 F (37.1 C)] 98.6 F (37 C) (10/23 0800) Pulse Rate:  [67-83] 75 (10/23 0800) Resp:  [11-18] 14 (10/23 0900) BP: (117-176)/(52-93) 137/68 (10/23 0900) SpO2:  [95 %-100 %] 96 % (10/23 0800) Weight:  [54 kg-60.4 kg] 60.4 kg (10/23 0323) Last BM Date: 08/08/18 General:   Alert, thin male in NAD EENT:  Normal hearing, icteric sclera.  Heart:  Regular rate and rhythm, no lower extremity edema Pulm: Normal respiratory effort Abdomen:  Soft, nondistended, nontender.  Normal bowel sounds Neurologic:  Alert and  oriented x4;  grossly normal neurologically. Psych:  Pleasant, cooperative.  Normal mood and affect.   Intake/Output from previous day: 10/22 0701 - 10/23 0700 In: 2501.4 [P.O.:150; I.V.:2251.3; IV Piggyback:100.1] Out: 750 [Urine:750] Intake/Output this shift: Total I/O In: -  Out: 400 [Urine:400]  Lab Results: Recent Labs    08/08/18 0315 08/08/18 0739 08/09/18 0318  WBC 8.5  9.4 5.6  HGB 8.6* 8.8* 8.1*  HCT 26.6* 26.6* 25.6*  PLT 75* 77* 77*   BMET Recent Labs    08/07/18 1655 08/08/18 0739 08/09/18 0318  NA 137 140 138  K 3.8 3.9 3.4*  CL 109 115* 110  CO2 22 22 22   GLUCOSE 108* 92 90  BUN 29* 28* 23  CREATININE 1.00 0.92 1.06  CALCIUM 8.0* 7.6* 7.6*   LFT Recent Labs    08/08/18 0739  PROT 6.6  ALBUMIN 2.1*  AST 108*  ALT 41  ALKPHOS 101  BILITOT 2.8*  BILIDIR 1.1*  IBILI 1.7*   PT/INR Recent Labs    08/07/18 1844  LABPROT 16.7*  INR 1.37   Hepatitis Panel No results for input(s): HEPBSAG, HCVAB, HEPAIGM, HEPBIGM in the last 72 hours.  Dg Chest Port 1 View  Result Date: 08/07/2018 CLINICAL DATA:  Chest pain EXAM: PORTABLE CHEST 1 VIEW COMPARISON:  04/21/2017 FINDINGS: Heart is borderline in size. No confluent airspace opacities or effusions. No acute bony abnormality. IMPRESSION: Borderline cardiomegaly.  No active disease. Electronically Signed   By: Rolm Baptise M.D.   On: 08/07/2018 21:42    Principal Problem:   Acute GI bleeding Active Problems:   Alcohol abuse   Chronic hepatitis C without hepatic coma (HCC)   Hepatocellular carcinoma (HCC)   Acute blood loss anemia   Hematemesis   Mallory-Weiss tear     LOS: 1 day   Tye Savoy ,NP 08/09/2018, 10:00  AM

## 2018-08-09 NOTE — Progress Notes (Signed)
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   Assessment & Plan:   Principal Problem:   Acute GI bleeding Active Problems:   Alcohol abuse   Chronic hepatitis C without hepatic coma (HCC)   Hepatocellular carcinoma (HCC)   Acute blood loss anemia   Hematemesis   Mallory-Weiss tear   #) Hematemesis due to Mallory-Weiss tear: Patient was admitted with hematemesis.  Due to his history of alcoholic cirrhosis it was initially presumed that he had a variceal bleed however EGD done on 08/06/2018 showed Mallory-Weiss tear.  5 clips were placed. -Hemoglobin stable, will recheck tomorrow -Continue PPI drip - Continue IV ceftriaxone - Gastroneurology was discontinued octreotide drip -Soft bland diet  #) Acute blood loss anemia: Likely secondary to bleeding from Mallory-Weiss tear.  This appears to stabilized. -We will recheck tomorrow  #) Alcoholic cirrhosis complicated by varices: Patient has been on diuretics in the past however he has been noncompliant with these or his beta-blocker. -Hold home furosemide 40 mg daily -Hold home spironolactone 50 mill grams 3 times daily - Hold home nadolol 40 mg twice daily  #) Alcohol abuse: This continues to be an active problem for the patient.  He has poor insight into his alcoholism. -Continue CIWA protocol -Continue thiamine and folate supplementation  #) Chronic hepatitis C: -Defer to outpatient management  #) Possible hepatocellular carcinoma: -Pending liver ultrasound  Fluids: Tolerating p.o. Electrolyte: Monitor and supplement Nutrition: Soft bland diet  Prophylaxis: SCDs  Disposition: Pending stabilization of hemoglobin and  no further bleeding  Full code   Consultants:   [facility]  Procedures: 08/08/2018 [id number] landmarks were identified: the Z-line was found at 40 cm, the gastroesophageal junction was found at 40 cm and the upper extent of the gastric folds was found at 40 cm from the incisors. Findings: A large bleeding Mallory-Weiss tear was found at the GEJ with active oozing. For hemostasis, five hemostatic clips were placed across it to achieve hemostasis. Esophagitis was found in the distal esophagus with some old adherent heme, no active bleeding. Small varices were found in the lower third of the esophagus but deflated with insufflation. The varices seemed less likely to be the cause of bleeding symptoms. Stigmata / scarring from prior banding noted. Focal mucosal changes were found in the lower third of the esophagus just proximal to the GEJ, across the lumen from where the Elbert was noted. Hypopigmented area with altered vascularity, could  be due to scarring from prior banding versus other. Biopsies not taken given active bleeding noted. The exam of the esophagus was otherwise normal. The entire examined stomach was normal. No gastric varices appreciated. A single small sessile polyp was found in the second portion of the duodenum. Biopsies not taken given active bleeding. The exam of the duodenum was otherwise normal. -    Antimicrobials:   IV ceftriaxone started 08/06/2018   Subjective: Patient reports he is feeling well.  He denies any nausea, vomiting, diarrhea, abdominal pain, cough, congestion.  He is requesting to be advanced to a soft diet.  He does continue to report some dark stools.  Objective: Vitals:   08/09/18 0500 08/09/18 0600 08/09/18 0800 08/09/18 0900  BP: 135/66 (!) 147/58 (!) 117/58 137/68  Pulse:  75   Resp: 13 13 14 14   Temp:   98.6 F (37 C)   TempSrc:   Oral   SpO2: 97%  96%   Weight:      Height:         Intake/Output Summary (Last 24 hours) at 08/09/2018 1048 Last data filed at 08/09/2018 0843 Gross per 24 hour  Intake 2266.54 ml  Output 1150 ml  Net 1116.54 ml   Filed Weights   08/08/18 0259 08/08/18 1033 08/09/18 0323  Weight: 54.7 kg 54 kg 60.4 kg    Examination:  General exam: Appears calm and comfortable  Respiratory system: Clear to auscultation. Respiratory effort normal. Cardiovascular system: Regular rate and rhythm, 2 out of 6 systolic murmur heard best at left upper sternal border Gastrointestinal system: Abdomen is nondistended, soft and nontender. No organomegaly or masses felt. Normal bowel sounds heard. Central nervous system: Alert and oriented. No focal neurological deficits. Extremities: No lower extremity edema Skin: No rashes over visible skin Psychiatry: Judgement and insight appear normal. Mood & affect appropriate.     Data Reviewed: I have personally reviewed following labs and imaging studies  CBC: Recent Labs  Lab 08/07/18 1655 08/07/18 2336 08/08/18 0315 08/08/18 0739 08/09/18 0318  WBC 7.1 8.1 8.5 9.4 5.6  HGB 10.9* 8.8* 8.6* 8.8* 8.1*  HCT 33.8* 27.2* 26.6* 26.6* 25.6*  MCV 102.4* 103.4* 103.1* 103.9* 105.8*  PLT 115* 81* 75* 77* 77*   Basic Metabolic Panel: Recent Labs  Lab 08/07/18 1655 08/08/18 0739 08/09/18 0318  NA 137 140 138  K 3.8 3.9 3.4*  CL 109 115* 110  CO2 22 22 22   GLUCOSE 108* 92 90  BUN 29* 28* 23  CREATININE 1.00 0.92 1.06  CALCIUM 8.0* 7.6* 7.6*   GFR: Estimated Creatinine Clearance: 57.8 mL/min (by C-G formula based on SCr of 1.06 mg/dL). Liver Function Tests: Recent Labs  Lab 08/07/18 1655 08/08/18 0739  AST 138* 108*  ALT 49* 41  ALKPHOS 121 101  BILITOT 3.5* 2.8*  PROT 8.0 6.6  ALBUMIN 2.5* 2.1*   No results for input(s): LIPASE, AMYLASE in the last 168 hours. No results for input(s): AMMONIA in the last 168 hours. Coagulation Profile: Recent Labs  Lab 08/07/18 1844  INR 1.37    Cardiac Enzymes: Recent Labs  Lab 08/07/18 2336 08/08/18 0812  TROPONINI 0.04* 0.06*   BNP (last 3 results) No results for input(s): PROBNP in the last 8760 hours. HbA1C: No results for input(s): HGBA1C in the last 72 hours. CBG: No results for input(s): GLUCAP in the last 168 hours. Lipid Profile: No results for input(s): CHOL, HDL, LDLCALC, TRIG, CHOLHDL, LDLDIRECT in the last 72 hours. Thyroid Function Tests: No results for input(s): TSH, T4TOTAL, FREET4, T3FREE, THYROIDAB in the last 72 hours. Anemia Panel: No results for input(s): VITAMINB12, FOLATE, FERRITIN, TIBC, IRON, RETICCTPCT in the last 72 hours. Sepsis Labs: No results for input(s): PROCALCITON, LATICACIDVEN in the last 168 hours.  Recent Results (from the past 240 hour(s))  MRSA PCR Screening     Status: Abnormal   Collection Time: 08/07/18 11:11 PM  Result Value Ref Range Status   MRSA by PCR POSITIVE (A) NEGATIVE Final    Comment:        The GeneXpert MRSA Assay (FDA approved for NASAL specimens only), is one component of a comprehensive MRSA colonization surveillance program. It is not intended to diagnose MRSA infection nor to guide or monitor treatment for MRSA infections. RESULT CALLED TO, READ  BACK BY AND VERIFIED WITHJohnette Abraham MATHI RN [id number] 08/08/18 A NAVARRO Performed at [facility], [place] [address] [place], [place] [address]          Radiology Studies: Dg Chest Port 1 View  Result Date: 08/07/2018 CLINICAL DATA:  Chest pain EXAM: PORTABLE CHEST 1 VIEW COMPARISON:  04/21/2017 FINDINGS: Heart is borderline in size. No confluent airspace opacities or effusions. No acute bony abnormality. IMPRESSION: Borderline cardiomegaly.  No active disease. Electronically Signed   By: Rolm Baptise M.D.   On: 08/07/2018 21:42        Scheduled Meds: . Chlorhexidine Gluconate Cloth  6 each Topical Q0600  . folic acid  1 mg Intravenous Daily  . mupirocin ointment  1 application Nasal  BID  . thiamine injection  100 mg Intravenous Daily   Continuous Infusions: . cefTRIAXone (ROCEPHIN)  IV Stopped (08/08/18 2142)  . pantoprozole (PROTONIX) infusion 8 mg/hr (08/09/18 0602)     LOS: 1 day    Time spent: H. Rivera Colon, MD [facility]  If 7PM-7AM, please contact night-coverage [email or web address] Password [email or web address] 08/09/2018, 10:48 AM

## 2018-08-10 LAB — COMPREHENSIVE METABOLIC PANEL
ALT: 40 U/L (ref 0–44)
AST: 119 U/L — ABNORMAL HIGH (ref 15–41)
Albumin: 2.1 g/dL — ABNORMAL LOW (ref 3.5–5.0)
Alkaline Phosphatase: 102 U/L (ref 38–126)
BUN: 18 mg/dL (ref 8–23)
CO2: 22 mmol/L (ref 22–32)
Calcium: 7.4 mg/dL — ABNORMAL LOW (ref 8.9–10.3)
Creatinine, Ser: 1 mg/dL (ref 0.61–1.24)
GFR calc Af Amer: >60 mL/min (ref >60)
Glucose, Bld: 83 mg/dL (ref 70–99)
Sodium: 137 mmol/L (ref 135–145)

## 2018-08-10 LAB — MAGNESIUM: Magnesium: 1.6 mg/dL — ABNORMAL LOW (ref 1.7–2.4)

## 2018-08-10 LAB — CBC
HCT: 26.8 % — ABNORMAL LOW (ref 39.0–52.0)
Hemoglobin: 8.6 g/dL — ABNORMAL LOW (ref 13.0–17.0)
MCH: 33.7 pg (ref 26.0–34.0)
MCHC: 32.1 g/dL (ref 30.0–36.0)
MCV: 105.1 fL — ABNORMAL HIGH (ref 80.0–100.0)
Platelets: 89 10*3/uL — ABNORMAL LOW (ref 150–400)
RBC: 2.55 MIL/uL — ABNORMAL LOW (ref 4.22–5.81)
RDW: 13.7 % (ref 11.5–15.5)
WBC: 5.1 K/uL (ref 4.0–10.5)
nRBC: 0 % (ref 0.0–0.2)

## 2018-08-10 LAB — COMPREHENSIVE METABOLIC PANEL WITH GFR
Anion gap: 7 (ref 5–15)
Chloride: 108 mmol/L (ref 98–111)
GFR calc non Af Amer: >60 mL/min (ref >60)
Potassium: 3.3 mmol/L — ABNORMAL LOW (ref 3.5–5.1)
Total Bilirubin: 2.3 mg/dL — ABNORMAL HIGH (ref 0.3–1.2)
Total Protein: 6.4 g/dL — ABNORMAL LOW (ref 6.5–8.1)

## 2018-08-10 LAB — AFP TUMOR MARKER: AFP, SERUM, TUMOR MARKER: 59542 ng/mL — AB (ref 0.0–8.3)

## 2018-08-10 MED ORDER — POTASSIUM CHLORIDE CRYS ER 20 MEQ PO TBCR
40.0000 meq | EXTENDED_RELEASE_TABLET | Freq: Once | ORAL | Status: AC
  Administered 2018-08-10: 40 meq via ORAL
  Filled 2018-08-10: qty 2

## 2018-08-10 MED ORDER — PANTOPRAZOLE SODIUM 40 MG PO TBEC
40.0000 mg | DELAYED_RELEASE_TABLET | Freq: Two times a day (BID) | ORAL | Status: DC
  Administered 2018-08-10: 40 mg via ORAL
  Filled 2018-08-10: qty 1

## 2018-08-10 MED ORDER — PANTOPRAZOLE SODIUM 40 MG PO TBEC: 40.0000 mg | DELAYED_RELEASE_TABLET | Freq: Two times a day (BID) | ORAL | 1 refills | Status: DC

## 2018-08-10 MED ORDER — MAGNESIUM SULFATE 4 GM/100ML IV SOLN
4.0000 g | Freq: Once | INTRAVENOUS | Status: AC
  Administered 2018-08-10: 4 g via INTRAVENOUS
  Filled 2018-08-10: qty 100

## 2018-08-10 NOTE — Discharge Instructions (Signed)
Gastrointestinal Bleeding Gastrointestinal bleeding is bleeding somewhere along the path food travels through the body (digestive tract). This path is anywhere between the mouth and the opening of the butt (anus). You may have blood in your poop (stools) or have black poop. If you throw up (vomit), there may be blood in it. This condition can be mild, serious, or even life-threatening. If you have a lot of bleeding, you may need to stay in the hospital. Follow these instructions at home:  Take over-the-counter and prescription medicines only as told by your doctor.  Eat foods that have a lot of fiber in them. These foods include whole grains, fruits, and vegetables. You can also try eating 1-3 prunes each day.  Drink enough fluid to keep your pee (urine) clear or pale yellow.  Keep all follow-up visits as told by your doctor. This is important. Contact a doctor if:  Your symptoms do not get better. Get help right away if:  Your bleeding gets worse.  You feel dizzy or you pass out (faint).  You feel weak.  You have very bad cramps in your back or belly (abdomen).  You pass large clumps of blood (clots) in your poop.  Your symptoms are getting worse. This information is not intended to replace advice given to you by your health care provider. Make sure you discuss any questions you have with your health care provider. Document Released: 07/13/2008 Document Revised: 03/11/2016 Document Reviewed: 03/24/2015 Elsevier Interactive Patient Education  2018 [employer].

## 2018-08-10 NOTE — Discharge Summary (Signed)
Physician Discharge Summary  Peter Nelson Staron GYI:[id number] DOB: May 08, 1951 DOA: 08/07/2018  PCP: Patient, No Pcp Per  Admit date: 08/07/2018 Discharge date: 08/10/2018  Admitted From: Home Disposition:  Home  Recommendations for Outpatient Follow-up:  1. Follow up with PCP in 1-2 weeks 2. Please obtain BMP/CBC in one week 3. Please follow up as an outpatient for your hepatitis C.  Home Health: No Equipment/Devices: No  Discharge Condition: stable CODE STATUS: FULL Diet recommendation: soft bland diet  Brief/Interim Summary:  #) Hematemesis due to Mallory-Weiss tear: Patient was admitted with hematemesis.  EGD done on 08/06/2018 showed a Mallory-Weiss tear.  5 clips were placed.  Patient's hemoglobin remained stable during the hospitalization.  Patient was initially on PPI drip and subcutaneous octreotide due to history of cirrhosis as the bleed was initially been presumed to be variceal however this was transitioned to twice daily PPI.  Patient was discharged on this.    #) Acute blood loss anemia: This appears to be stabilized for several days on recheck.  This is likely secondary to bleeding from Mallory-Weiss tear.  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  Patient apparently was on spironolactone and furosemide for his alcoholic cirrhosis however his compliance is unclear.  Encourage the patient to take medications and follow-up as an outpatient.  Patient was continued on home nadolol as well.  #) Chronic hepatitis C: Encourage patient to follow-up as an outpatient.  #) Possible hepatocellular carcinoma: Liver ultrasound on 08/09/2018 showed hepatic cirrhosis with bilateral hypoechoic solid hepatic lesions concerning for progression of multifocal hepatocellular carcinoma.  Discharge Diagnoses:  Principal Problem:   Acute GI bleeding Active Problems:   Alcohol abuse   Chronic hepatitis C without hepatic coma (HCC)   Hepatocellular carcinoma (HCC)   Acute blood loss anemia    Hematemesis   Mallory-Weiss tear    Discharge Instructions  Discharge Instructions    Call MD for:  difficulty breathing, headache or visual disturbances   Complete by:  As directed    Call MD for:  extreme fatigue   Complete by:  As directed    Call MD for:  hives   Complete by:  As directed    Call MD for:  persistant dizziness or light-headedness   Complete by:  As directed    Call MD for:  persistant nausea and vomiting   Complete by:  As directed    Call MD for:  redness, tenderness, or signs of infection (pain, swelling, redness, odor or green/yellow discharge around incision site)   Complete by:  As directed    Call MD for:  severe uncontrolled pain   Complete by:  As directed    Call MD for:  temperature >100.4   Complete by:  As directed    Diet - low sodium heart healthy   Complete by:  As directed    Discharge instructions   Complete by:  As directed    Please f/u with your pcp in 1-2 weeks.   Increase activity slowly   Complete by:  As directed      Allergies as of 08/10/2018      Reactions   Aspirin Other (See Comments)   Does not take because of hep c      Medication List    STOP taking these medications   terazosin 1 MG capsule Commonly known as:  HYTRIN     TAKE these medications   diazepam 2 MG tablet Commonly known as:  VALIUM Take 1 tablet (2 mg  total) by mouth every 6 (six) hours as needed for muscle spasms.   famotidine 20 MG tablet Commonly known as:  PEPCID Take 1 tablet (20 mg total) by mouth 2 (two) times daily.   furosemide 40 MG tablet Commonly known as:  LASIX Take 1 tablet (40 mg total) by mouth daily.   methocarbamol 750 MG tablet Commonly known as:  ROBAXIN Take 1 tablet (750 mg total) by mouth 4 (four) times daily.   nadolol 40 MG tablet Commonly known as:  CORGARD Take 1 tablet (40 mg total) by mouth 2 (two) times daily.   pantoprazole 40 MG tablet Commonly known as:  PROTONIX Take 1 tablet (40 mg total) by mouth 2  (two) times daily.   potassium chloride SA 20 MEQ tablet Commonly known as:  K-DUR,KLOR-CON Take 2 tablets (40 mEq total) by mouth 2 (two) times daily.   spironolactone 50 MG tablet Commonly known as:  ALDACTONE Take 1 tablet (50 mg total) by mouth 3 (three) times daily.   sucralfate 1 GM/10ML suspension Commonly known as:  CARAFATE Take 10 mLs (1 g total) by mouth 4 (four) times daily -  with meals and at bedtime.   tamsulosin 0.4 MG Caps capsule Commonly known as:  FLOMAX Take 1 capsule (0.4 mg total) by mouth daily.       Allergies  Allergen Reactions  . Aspirin Other (See Comments)    Does not take because of hep c    Consultations:    gastroenterology   Procedures/Studies: Dg Chest Port 1 View  Result Date: 08/07/2018 CLINICAL DATA:  Chest pain EXAM: PORTABLE CHEST 1 VIEW COMPARISON:  04/21/2017 FINDINGS: Heart is borderline in size. No confluent airspace opacities or effusions. No acute bony abnormality. IMPRESSION: Borderline cardiomegaly.  No active disease. Electronically Signed   By: Rolm Baptise M.D.   On: 08/07/2018 21:42   US Liver Doppler  Result Date: 08/09/2018 CLINICAL DATA:  Cirrhosis, hep C, history hepatocellular carcinoma, status post previous microwave ablations EXAM: DUPLEX ULTRASOUND OF LIVER TECHNIQUE: Color and duplex Doppler ultrasound was performed to evaluate the hepatic in-flow and out-flow vessels. COMPARISON:  02/28/2017 FINDINGS: Portal Vein Velocities Main:  13 cm/sec Right:  10 cm/sec Left:  14 cm/sec Hepatic Vein Velocities Right:  86 cm/sec Middle:  20 cm/sec Left:  28 cm/sec Hepatic Artery Velocity:  98 cm/sec Splenic Vein Velocity:  20 cm/sec Varices: Not visualized Ascites: Small amount of perihepatic ascites Patent portal, hepatic and splenic veins with normal directional flow. Negative for portal vein thrombus or occlusion. Heterogeneous liver with nodularity of the surface compatible with known cirrhosis. Solid hypoechoic  hepatic lesions bilaterally, 1 measuring 1.5 cm in the left hepatic lobe lateral segment and a second the right inferior lesion slightly exophytic measuring 4.3 cm. This is concerning for progression of multifocal hepatocellular carcinoma versus metastatic disease. IMPRESSION: Patent portal, hepatic and splenic veins. Hepatic cirrhosis with bilateral hypoechoic solid hepatic lesions concerning for progression of multifocal hepatocellular carcinoma versus metastatic disease. Perihepatic ascites Electronically Signed   By: Jerilynn Mages.  Shick M.D.   On: 08/09/2018 18:02   08/08/2018 [id number] landmarks were identified: the Z-line was found at 40 cm, the gastroesophageal junction was found at 40 cm and the upper extent of the gastric folds was found at 40 cm from the incisors. Findings: A large bleeding Mallory-Weiss tear was found at the GEJ with active oozing. For hemostasis, five hemostatic clips were placed across it to achieve hemostasis. Esophagitis was found in the distal esophagus  with some old adherent heme, no active bleeding. Small varices were found in the lower third of the esophagus but deflated with insufflation. The varices seemed less likely to be the cause of bleeding symptoms. Stigmata / scarring from prior banding noted. Focal mucosal changes were found in the lower third of the esophagus just proximal to the GEJ, across the lumen from where the Hewitt was noted. Hypopigmented area with altered vascularity, could  be due to scarring from prior banding versus other. Biopsies not taken given active bleeding noted. The exam of the esophagus was otherwise normal. The entire examined stomach was normal. No gastric varices appreciated. A single small sessile polyp was found in the second portion of the duodenum. Biopsies not taken given active bleeding. The exam of the duodenum was otherwise normal.   Subjective:   Discharge Exam: Vitals:   08/10/18 1130 08/10/18 1227   BP:  (!) 171/84  Pulse:    Resp:  16  Temp: 98.3 F (36.8 C)   SpO2:     Vitals:   08/10/18 0720 08/10/18 0800 08/10/18 1130 08/10/18 1227  BP:  (!) 149/77  (!) 171/84  Pulse:      Resp:  13  16  Temp: 98.4 F (36.9 C)  98.3 F (36.8 C)   TempSrc: Oral  Oral   SpO2:  100%    Weight:      Height:       General exam: Appears calm and comfortable  Respiratory system: Clear to auscultation. Respiratory effort normal. Cardiovascular system: Regular rate and rhythm, 2 out of 6 systolic murmur heard best at left upper sternal border Gastrointestinal system: Abdomen is nondistended, soft and nontender. No organomegaly or masses felt. Normal bowel sounds heard. Central nervous system: Alert and oriented. No focal neurological deficits. Extremities: No lower extremity edema Skin: No rashes over visible skin Psychiatry: Judgement and insight appear normal. Mood & affect appropriate.   The results of significant diagnostics from this hospitalization (including imaging, microbiology, ancillary and laboratory) are listed below for reference.     Microbiology: Recent Results (from the past 240 hour(s))  MRSA PCR Screening     Status: Abnormal   Collection Time: 08/07/18 11:11 PM  Result Value Ref Range Status   MRSA by PCR POSITIVE (A) NEGATIVE Final    Comment:        The GeneXpert MRSA Assay (FDA approved for NASAL specimens only), is one component of a comprehensive MRSA colonization surveillance program. It is not intended to diagnose MRSA infection nor to guide or monitor treatment for MRSA infections. RESULT CALLED TO, READ BACK BY AND VERIFIED WITHJohnette Abraham MATHI RN [id number] 08/08/18 A NAVARRO Performed at [facility], [place] [address], [place], [place] [address]      Labs: BNP (last 3 results) No results for input(s): BNP in the last 8760 hours. Basic Metabolic Panel: Recent Labs  Lab 08/07/18 1655 08/08/18 0739 08/09/18 0318 08/10/18 0310  NA 137  140 138 137  K 3.8 3.9 3.4* 3.3*  CL 109 115* 110 108  CO2 22 22 22 22   GLUCOSE 108* 92 90 83  BUN 29* 28* 23 18  CREATININE 1.00 0.92 1.06 1.00  CALCIUM 8.0* 7.6* 7.6* 7.4*  MG  --   --   --  1.6*   Liver Function Tests: Recent Labs  Lab 08/07/18 1655 08/08/18 0739 08/10/18 0310  AST 138* 108* 119*  ALT 49* 41 40  ALKPHOS 121 101 102  BILITOT  3.5* 2.8* 2.3*  PROT 8.0 6.6 6.4*  ALBUMIN 2.5* 2.1* 2.1*   No results for input(s): LIPASE, AMYLASE in the last 168 hours. No results for input(s): AMMONIA in the last 168 hours. CBC: Recent Labs  Lab 08/07/18 2336 08/08/18 0315 08/08/18 0739 08/09/18 0318 08/10/18 0310  WBC 8.1 8.5 9.4 5.6 5.1  HGB 8.8* 8.6* 8.8* 8.1* 8.6*  HCT 27.2* 26.6* 26.6* 25.6* 26.8*  MCV 103.4* 103.1* 103.9* 105.8* 105.1*  PLT 81* 75* 77* 77* 89*   Cardiac Enzymes: Recent Labs  Lab 08/07/18 2336 08/08/18 0812  TROPONINI 0.04* 0.06*   BNP: Invalid input(s): POCBNP CBG: No results for input(s): GLUCAP in the last 168 hours. D-Dimer No results for input(s): DDIMER in the last 72 hours. Hgb A1c No results for input(s): HGBA1C in the last 72 hours. Lipid Profile No results for input(s): CHOL, HDL, LDLCALC, TRIG, CHOLHDL, LDLDIRECT in the last 72 hours. Thyroid function studies No results for input(s): TSH, T4TOTAL, T3FREE, THYROIDAB in the last 72 hours.  Invalid input(s): FREET3 Anemia work up No results for input(s): VITAMINB12, FOLATE, FERRITIN, TIBC, IRON, RETICCTPCT in the last 72 hours. Urinalysis    Component Value Date/Time   COLORURINE YELLOW 04/20/2017 2340   APPEARANCEUR CLEAR 04/20/2017 2340   LABSPEC 1.020 05/04/2017 0914   PHURINE 7.0 05/04/2017 0914   GLUCOSEU NEGATIVE 05/04/2017 0914   HGBUR MODERATE (A) 05/04/2017 0914   BILIRUBINUR SMALL (A) 05/04/2017 0914   KETONESUR NEGATIVE 05/04/2017 0914   PROTEINUR 100 (A) 05/04/2017 0914   UROBILINOGEN >=8.0 05/04/2017 0914   NITRITE POSITIVE (A) 05/04/2017 0914    LEUKOCYTESUR SMALL (A) 05/04/2017 0914   Sepsis Labs Invalid input(s): PROCALCITONIN,  WBC,  LACTICIDVEN Microbiology Recent Results (from the past 240 hour(s))  MRSA PCR Screening     Status: Abnormal   Collection Time: 08/07/18 11:11 PM  Result Value Ref Range Status   MRSA by PCR POSITIVE (A) NEGATIVE Final    Comment:        The GeneXpert MRSA Assay (FDA approved for NASAL specimens only), is one component of a comprehensive MRSA colonization surveillance program. It is not intended to diagnose MRSA infection nor to guide or monitor treatment for MRSA infections. RESULT CALLED TO, READ BACK BY AND VERIFIED WITHJohnette Abraham MATHI RN [id number] 08/08/18 A NAVARRO Performed at [facility], [place] [address] [place], [place] [address]      Time coordinating discharge: 36  SIGNED:   Cristy Folks, MD  [facility] 08/10/2018, 1:25 PM  If 7PM-7AM, please contact night-coverage [email or web address] Password [email or web address]

## 2018-08-10 NOTE — Progress Notes (Signed)
Progress Note   Assessment / Plan:   Assessment: 1.  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  Admitted with upper GI bleed from Mallory-Weiss tear. -PPI infusion stopped this morning  -Pantoprazole 40 mg p.o. twice daily started -Again may need repeat EGD in several weeks to reevaluate esophageal mucosa and duodenal polyp -Liver Doppler reviewed from yesterday -Patient can likely be advanced to soft diet today -Patient will follow with Dr. [name] in the near future -Please await any further recommendations from Dr. Rush Landmark later today  Thank you for your kind consultation, we will continue to follow.   Subjective  Chief Complaint: GI bleed  No further bleeding overnight, patient tells me that he feels well and denies any new complaints or concerns.  He wonders if he will be able to go home today.    Objective   Vital signs in last 24 hours: Temp:  [98.1 F (36.7 C)-98.7 F (37.1 C)] 98.4 F (36.9 C) (10/24 0720) Pulse Rate:  [71] 71 (10/24 0100) Resp:  [11-17] 13 (10/24 0800) BP: (129-162)/(63-77) 149/77 (10/24 0800) SpO2:  [96 %-100 %] 100 % (10/24 0800) Weight:  [61.5 kg] 61.5 kg (10/24 0314) Last BM Date: 08/09/18 General:    AA male in NAD Heart:  Regular rate and rhythm; no murmurs Lungs: Respirations even and unlabored, lungs CTA bilaterally Abdomen:  Soft, nontender and nondistended. Normal bowel sounds. Extremities:  Without edema. Neurologic:  Alert and oriented,  grossly normal neurologically. Psych:  Cooperative. Normal mood and affect.  Intake/Output from previous day: 10/23 0701 - 10/24 0700 In: 664.2 [I.V.:564.2; IV Piggyback:100] Out: 650 [Urine:650] Intake/Output this shift: Total I/O In: 135.4 [I.V.:135.4] Out: -   Lab Results: Recent Labs    08/08/18 0739 08/09/18 0318 08/10/18 0310  WBC 9.4 5.6 5.1  HGB 8.8* 8.1* 8.6*  HCT 26.6* 25.6* 26.8*  PLT 77* 77* 89*   BMET Recent Labs    08/08/18 0739  08/09/18 0318 08/10/18 0310  NA 140 138 137  K 3.9 3.4* 3.3*  CL 115* 110 108  CO2 22 22 22   GLUCOSE 92 90 83  BUN 28* 23 18  CREATININE 0.92 1.06 1.00  CALCIUM 7.6* 7.6* 7.4*   LFT Recent Labs    08/08/18 0739 08/10/18 0310  PROT 6.6 6.4*  ALBUMIN 2.1* 2.1*  AST 108* 119*  ALT 41 40  ALKPHOS 101 102  BILITOT 2.8* 2.3*  BILIDIR 1.1*  --   IBILI 1.7*  --    PT/INR Recent Labs    08/07/18 1844  LABPROT 16.7*  INR 1.37    Studies/Results: US Liver Doppler  Result Date: 08/09/2018 CLINICAL DATA:  Cirrhosis, hep C, history hepatocellular carcinoma, status post previous microwave ablations EXAM: DUPLEX ULTRASOUND OF LIVER TECHNIQUE: Color and duplex Doppler ultrasound was performed to evaluate the hepatic in-flow and out-flow vessels. COMPARISON:  02/28/2017 FINDINGS: Portal Vein Velocities Main:  13 cm/sec Right:  10 cm/sec Left:  14 cm/sec Hepatic Vein Velocities Right:  86 cm/sec Middle:  20 cm/sec Left:  28 cm/sec Hepatic Artery Velocity:  98 cm/sec Splenic Vein Velocity:  20 cm/sec Varices: Not visualized Ascites: Small amount of perihepatic ascites Patent portal, hepatic and splenic veins with normal directional flow. Negative for portal vein thrombus or occlusion. Heterogeneous liver with nodularity of the surface compatible with known cirrhosis. Solid hypoechoic hepatic lesions bilaterally, 1 measuring 1.5 cm in the left hepatic lobe lateral segment and a second the right inferior lesion slightly exophytic  measuring 4.3 cm. This is concerning for progression of multifocal hepatocellular carcinoma versus metastatic disease. IMPRESSION: Patent portal, hepatic and splenic veins. Hepatic cirrhosis with bilateral hypoechoic solid hepatic lesions concerning for progression of multifocal hepatocellular carcinoma versus metastatic disease. Perihepatic ascites Electronically Signed   By: Jerilynn Mages.  Shick M.D.   On: 08/09/2018 18:02    LOS: 2 days   Peter Nelson  08/10/2018, 9:31  AM

## 2018-08-11 LAB — HEPATITIS C VRS RNA DETECT BY PCR-QUAL: Hepatitis C Vrs RNA by PCR-Qual: NEGATIVE

## 2018-08-16 ENCOUNTER — Telehealth: Payer: Self-pay

## 2018-08-16 ENCOUNTER — Other Ambulatory Visit: Payer: Self-pay

## 2018-08-16 DIAGNOSIS — C220 Liver cell carcinoma: Secondary | ICD-10-CM

## 2018-08-16 NOTE — Telephone Encounter (Signed)
Unable to reach pt by phone. Pt scheduled to see Dr. [name] 09/05/18@2 :15pm. Appt letter mailed to pt. Order in epic for eval and treat by IR, referral in epic for appt with Cancer center.

## 2018-08-16 NOTE — Telephone Encounter (Signed)
-----   Message from Jerene Bears, MD sent at 08/16/2018  8:40 AM EDT ----- Regarding: RE: follow up Agree We need to get him back in, to oncology also.  If he has seen IR in the past, then back to them as well JMP  ----- Message ----- From: Yetta Flock, MD Sent: 08/15/2018   7:32 PM EDT To: Algernon Huxley, RN, Jerene Bears, MD Subject: follow up                                      Ulice Dash this is a patient I saw once when on team purple helping Gabe last week. You haven't seen him in a while. Cirrhosis with poor follow up. He has recurrent HCC, AFP markedly elevated and imaging shows recurrent masses. His HCV and AFP lab just came back to me. I don't see he had follow up scheduled with you, not sure if he is seeing Onc about this but he needs to be plugged in for this, wanted to make sure this didn't slip through.  Vaughan Basta can you touch base with him and see if he has follow up for this? Thanks

## 2018-08-17 ENCOUNTER — Telehealth: Payer: Self-pay | Admitting: *Deleted

## 2018-08-17 NOTE — Telephone Encounter (Signed)
Called MR. Deen to schedule F/U in IR w/ Dr. Deniece Portela.-BC

## 2018-08-18 ENCOUNTER — Telehealth: Payer: Self-pay | Admitting: Oncology

## 2018-08-18 ENCOUNTER — Encounter: Payer: Self-pay | Admitting: Oncology

## 2018-08-18 NOTE — Telephone Encounter (Signed)
A new patient appointment has been scheduled for the pt to see Dr. Benay Spice on 11/12 at 2pm. Unable to reach the pt. Letter mailed with the appt date and time. Will notify the referring office.

## 2018-08-18 NOTE — Telephone Encounter (Signed)
From: Nena Polio Sent: 08/18/2018  10:03 AM EDT To: Algernon Huxley, RN Subject: Appointment                                    Good morning Vaughan Basta!  I scheduled the pt an appointment to see Dr. Benay Spice on 11/12 at 2pm. I attempted to call but the person that picked up the phone said I had the wrong number. I will mail the pt a letter with the appointment date and time.   Have a good weekend!  Seth Bake

## 2018-08-22 ENCOUNTER — Inpatient Hospital Stay (HOSPITAL_COMMUNITY)
Admission: EM | Admit: 2018-08-22 | Discharge: 2018-08-26 | DRG: 432 | Disposition: A | Discharge status: Home Health | Payer: Medicare HMO | Attending: Internal Medicine | Admitting: Internal Medicine

## 2018-08-22 ENCOUNTER — Encounter (HOSPITAL_COMMUNITY): Payer: Self-pay | Admitting: Emergency Medicine

## 2018-08-22 DIAGNOSIS — K3189 Other diseases of stomach and duodenum: Secondary | ICD-10-CM | POA: Diagnosis present

## 2018-08-22 DIAGNOSIS — Z8719 Personal history of other diseases of the digestive system: Secondary | ICD-10-CM

## 2018-08-22 DIAGNOSIS — I8501 Esophageal varices with bleeding: Secondary | ICD-10-CM

## 2018-08-22 DIAGNOSIS — I8500 Esophageal varices without bleeding: Secondary | ICD-10-CM | POA: Diagnosis not present

## 2018-08-22 DIAGNOSIS — F1010 Alcohol abuse, uncomplicated: Secondary | ICD-10-CM | POA: Diagnosis not present

## 2018-08-22 DIAGNOSIS — D62 Acute posthemorrhagic anemia: Secondary | ICD-10-CM | POA: Diagnosis present

## 2018-08-22 DIAGNOSIS — R188 Other ascites: Secondary | ICD-10-CM | POA: Diagnosis not present

## 2018-08-22 DIAGNOSIS — C220 Liver cell carcinoma: Secondary | ICD-10-CM | POA: Diagnosis not present

## 2018-08-22 DIAGNOSIS — F1020 Alcohol dependence, uncomplicated: Secondary | ICD-10-CM | POA: Diagnosis present

## 2018-08-22 DIAGNOSIS — Z8249 Family history of ischemic heart disease and other diseases of the circulatory system: Secondary | ICD-10-CM | POA: Diagnosis not present

## 2018-08-22 DIAGNOSIS — F17210 Nicotine dependence, cigarettes, uncomplicated: Secondary | ICD-10-CM | POA: Diagnosis present

## 2018-08-22 DIAGNOSIS — R1084 Generalized abdominal pain: Secondary | ICD-10-CM

## 2018-08-22 DIAGNOSIS — K7030 Alcoholic cirrhosis of liver without ascites: Secondary | ICD-10-CM | POA: Diagnosis present

## 2018-08-22 DIAGNOSIS — D689 Coagulation defect, unspecified: Secondary | ICD-10-CM | POA: Diagnosis present

## 2018-08-22 DIAGNOSIS — Z886 Allergy status to analgesic agent status: Secondary | ICD-10-CM | POA: Diagnosis not present

## 2018-08-22 DIAGNOSIS — Z79899 Other long term (current) drug therapy: Secondary | ICD-10-CM | POA: Diagnosis not present

## 2018-08-22 DIAGNOSIS — Z8619 Personal history of other infectious and parasitic diseases: Secondary | ICD-10-CM

## 2018-08-22 DIAGNOSIS — K7460 Unspecified cirrhosis of liver: Secondary | ICD-10-CM | POA: Diagnosis not present

## 2018-08-22 DIAGNOSIS — I10 Essential (primary) hypertension: Secondary | ICD-10-CM

## 2018-08-22 DIAGNOSIS — I8511 Secondary esophageal varices with bleeding: Secondary | ICD-10-CM | POA: Diagnosis present

## 2018-08-22 DIAGNOSIS — Z800 Family history of malignant neoplasm of digestive organs: Secondary | ICD-10-CM

## 2018-08-22 DIAGNOSIS — K7031 Alcoholic cirrhosis of liver with ascites: Principal | ICD-10-CM | POA: Diagnosis present

## 2018-08-22 DIAGNOSIS — K922 Gastrointestinal hemorrhage, unspecified: Secondary | ICD-10-CM | POA: Diagnosis present

## 2018-08-22 DIAGNOSIS — K209 Esophagitis, unspecified: Secondary | ICD-10-CM | POA: Diagnosis present

## 2018-08-22 DIAGNOSIS — B1920 Unspecified viral hepatitis C without hepatic coma: Secondary | ICD-10-CM | POA: Diagnosis present

## 2018-08-22 DIAGNOSIS — D696 Thrombocytopenia, unspecified: Secondary | ICD-10-CM | POA: Diagnosis present

## 2018-08-22 DIAGNOSIS — R1013 Epigastric pain: Secondary | ICD-10-CM | POA: Diagnosis not present

## 2018-08-22 DIAGNOSIS — D649 Anemia, unspecified: Secondary | ICD-10-CM | POA: Diagnosis not present

## 2018-08-22 DIAGNOSIS — K921 Melena: Secondary | ICD-10-CM | POA: Diagnosis not present

## 2018-08-22 DIAGNOSIS — Z8505 Personal history of malignant neoplasm of liver: Secondary | ICD-10-CM

## 2018-08-22 DIAGNOSIS — R195 Other fecal abnormalities: Secondary | ICD-10-CM | POA: Diagnosis present

## 2018-08-22 LAB — CBC WITH DIFFERENTIAL/PLATELET
Abs Immature Granulocytes: 0.03 10*3/uL (ref 0.00–0.07)
Basophils Absolute: 0.1 10*3/uL (ref 0.0–0.1)
Basophils Relative: 1 %
Eosinophils Absolute: 0.2 10*3/uL (ref 0.0–0.5)
Eosinophils Relative: 2 %
HCT: 23.3 % — ABNORMAL LOW (ref 39.0–52.0)
Hemoglobin: 7.4 g/dL — ABNORMAL LOW (ref 13.0–17.0)
Immature Granulocytes: 0 %
Lymphocytes Relative: 34 %
Lymphs Abs: 2.3 10*3/uL (ref 0.7–4.0)
MCH: 33 pg (ref 26.0–34.0)
MCHC: 31.8 g/dL (ref 30.0–36.0)
MCV: 104 fL — ABNORMAL HIGH (ref 80.0–100.0)
Monocytes Absolute: 0.7 10*3/uL (ref 0.1–1.0)
Monocytes Relative: 10 %
Neutro Abs: 3.6 10*3/uL (ref 1.7–7.7)
Neutrophils Relative %: 53 %
Platelets: 152 10*3/uL (ref 150–400)
RBC: 2.24 MIL/uL — ABNORMAL LOW (ref 4.22–5.81)
RDW: 14.6 % (ref 11.5–15.5)
WBC: 6.8 10*3/uL (ref 4.0–10.5)
nRBC: 0 % (ref 0.0–0.2)

## 2018-08-22 LAB — CBC
HCT: 27.3 % — ABNORMAL LOW (ref 39.0–52.0)
Hemoglobin: 8.6 g/dL — ABNORMAL LOW (ref 13.0–17.0)
MCH: 33.1 pg (ref 26.0–34.0)
MCHC: 31.5 g/dL (ref 30.0–36.0)
MCV: 105 fL — ABNORMAL HIGH (ref 80.0–100.0)
Platelets: 184 10*3/uL (ref 150–400)
RBC: 2.6 MIL/uL — ABNORMAL LOW (ref 4.22–5.81)
RDW: 14.7 % (ref 11.5–15.5)
WBC: 6.6 10*3/uL (ref 4.0–10.5)
nRBC: 0 % (ref 0.0–0.2)

## 2018-08-22 LAB — COMPREHENSIVE METABOLIC PANEL
ALT: 43 U/L (ref 0–44)
AST: 106 U/L — ABNORMAL HIGH (ref 15–41)
Albumin: 2.4 g/dL — ABNORMAL LOW (ref 3.5–5.0)
Alkaline Phosphatase: 122 U/L (ref 38–126)
Anion gap: 7 (ref 5–15)
BUN: 12 mg/dL (ref 8–23)
CO2: 22 mmol/L (ref 22–32)
Calcium: 8.1 mg/dL — ABNORMAL LOW (ref 8.9–10.3)
Chloride: 110 mmol/L (ref 98–111)
Creatinine, Ser: 0.93 mg/dL (ref 0.61–1.24)
GFR calc Af Amer: >60 mL/min (ref >60)
GFR calc non Af Amer: >60 mL/min (ref >60)
Glucose, Bld: 91 mg/dL (ref 70–99)
Potassium: 3.9 mmol/L (ref 3.5–5.1)
Sodium: 139 mmol/L (ref 135–145)
Total Bilirubin: 2.2 mg/dL — ABNORMAL HIGH (ref 0.3–1.2)
Total Protein: 7.4 g/dL (ref 6.5–8.1)

## 2018-08-22 MED ORDER — SODIUM CHLORIDE 0.9 % IV SOLN
50.0000 ug/h | INTRAVENOUS | Status: DC
  Administered 2018-08-22 – 2018-08-24 (×4): 50 ug/h via INTRAVENOUS
  Filled 2018-08-22 (×10): qty 1

## 2018-08-22 MED ORDER — SODIUM CHLORIDE 0.9 % IV SOLN
INTRAVENOUS | Status: AC
  Administered 2018-08-23: 01:00:00 via INTRAVENOUS

## 2018-08-22 MED ORDER — ADULT MULTIVITAMIN W/MINERALS CH
1.0000 | ORAL_TABLET | Freq: Every day | ORAL | Status: DC
  Administered 2018-08-23 – 2018-08-26 (×4): 1 via ORAL
  Filled 2018-08-22 (×4): qty 1

## 2018-08-22 MED ORDER — LORAZEPAM 2 MG/ML IJ SOLN: 1.0000 mg | Freq: Four times a day (QID) | INTRAMUSCULAR | Status: AC | PRN

## 2018-08-22 MED ORDER — LORAZEPAM 1 MG PO TABS: 1.0000 mg | ORAL_TABLET | Freq: Four times a day (QID) | ORAL | Status: AC | PRN

## 2018-08-22 MED ORDER — VITAMIN B-1 100 MG PO TABS
100.0000 mg | ORAL_TABLET | Freq: Every day | ORAL | Status: DC
  Administered 2018-08-23 – 2018-08-26 (×4): 100 mg via ORAL
  Filled 2018-08-22 (×4): qty 1

## 2018-08-22 MED ORDER — SODIUM CHLORIDE 0.9 % IV SOLN
1.0000 g | Freq: Every day | INTRAVENOUS | Status: DC
  Administered 2018-08-23 – 2018-08-25 (×4): 1 g via INTRAVENOUS
  Filled 2018-08-22: qty 1
  Filled 2018-08-22: qty 10
  Filled 2018-08-22 (×3): qty 1

## 2018-08-22 MED ORDER — MORPHINE SULFATE (PF) 4 MG/ML IV SOLN
4.0000 mg | Freq: Once | INTRAVENOUS | Status: AC
  Administered 2018-08-22: 4 mg via INTRAVENOUS
  Filled 2018-08-22: qty 1

## 2018-08-22 MED ORDER — THIAMINE HCL 100 MG/ML IJ SOLN
100.0000 mg | Freq: Every day | INTRAMUSCULAR | Status: DC
  Filled 2018-08-22: qty 2

## 2018-08-22 MED ORDER — OCTREOTIDE LOAD VIA INFUSION
50.0000 ug | Freq: Once | INTRAVENOUS | Status: AC
  Administered 2018-08-22: 50 ug via INTRAVENOUS
  Filled 2018-08-22: qty 25

## 2018-08-22 MED ORDER — SODIUM CHLORIDE 0.9 % IV SOLN
80.0000 mg | Freq: Once | INTRAVENOUS | Status: AC
  Administered 2018-08-22: 80 mg via INTRAVENOUS
  Filled 2018-08-22: qty 80

## 2018-08-22 MED ORDER — PANTOPRAZOLE SODIUM 40 MG IV SOLR
40.0000 mg | Freq: Two times a day (BID) | INTRAVENOUS | Status: DC
  Administered 2018-08-23 – 2018-08-25 (×5): 40 mg via INTRAVENOUS
  Filled 2018-08-22 (×5): qty 40

## 2018-08-22 MED ORDER — FOLIC ACID 1 MG PO TABS
1.0000 mg | ORAL_TABLET | Freq: Every day | ORAL | Status: DC
  Administered 2018-08-23 – 2018-08-26 (×4): 1 mg via ORAL
  Filled 2018-08-22 (×4): qty 1

## 2018-08-22 NOTE — ED Notes (Signed)
Pt only wanted lab drawn from right hand.  RN came into assist with blood draw.
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
## 2018-08-22 NOTE — Progress Notes (Signed)
ED TO INPATIENT HANDOFF REPORT  Name/Age/Gender Peter Nelson 67 y.o. male  Code Status Code Status History    Date Active Date Inactive Code Status Order ID Comments User Context   08/07/2018 2112 08/10/2018 2212 Full Code [id number]  Rise Patience, MD ED   01/11/2017 1456 01/12/2017 2103 Full Code [id number]  Caren Griffins, MD Inpatient   12/29/2016 2014 01/01/2017 1525 Full Code [id number]  Vianne Bulls, MD ED   10/25/2016 2347 10/30/2016 1942 Full Code [id number]  Rise Patience, MD ED   10/25/2016 2249 10/25/2016 2346 Full Code [id number]  Varney Biles, MD ED   03/15/2016 2043 03/16/2016 1451 Full Code [id number]  Etta Quill, DO ED    Advance Directive Documentation     Most Recent Value  Type of Advance Directive  Healthcare Power of Attorney, Living will  Pre-existing out of facility DNR order (yellow form or pink MOST form)  -  "MOST" Form in Place?  -      Home/SNF/Other Home  Chief Complaint Stomach Bloating; Black Stool  Level of Care/Admitting Diagnosis ED Disposition    ED Disposition Condition [place]: [facility] [100102]  Level of Care: Telemetry [5]  Admit to tele based on following criteria: Other see comments  Comments: tachycardia  Diagnosis: GI bleed [326712]  Admitting Physician: Shela Leff [[id number]]  Attending Physician: Shela Leff [[id number]]  PT Class (Do Not Modify): Observation [104]  PT Acc Code (Do Not Modify): Observation [10022]       Medical History Past Medical History:  Diagnosis Date  . Alcohol abuse   . Allergy   . Arthritis   . Asthma   . Cancer ([place])    liver  . Cirrhosis ([place])   . Dysrhythmia   . Hepatitis C   . Hepatocellular carcinoma ([place])   . Hyperplastic colon polyp   . Hypertension   . Internal hemorrhoids   . Shortness of breath dyspnea    with activity and anxiety  . Ulcerative colitis ([place])   . Wears glasses     Allergies Allergies   Allergen Reactions  . Aspirin Other (See Comments)    Does not take because of hep c    IV Location/Drains/Wounds Patient Lines/Drains/Airways Status   Active Line/Drains/Airways    Name:   Placement date:   Placement time:   Site:   Days:   Peripheral IV 08/22/18 Right Antecubital   08/22/18    1803    Antecubital   less than 1          Labs/Imaging Results for orders placed or performed during the hospital encounter of 08/22/18 (from the past 48 hour(s))  Comprehensive metabolic panel     Status: Abnormal   Collection Time: 08/22/18  2:14 PM  Result Value Ref Range   Sodium 139 135 - 145 mmol/L   Potassium 3.9 3.5 - 5.1 mmol/L   Chloride 110 98 - 111 mmol/L   CO2 22 22 - 32 mmol/L   Glucose, Bld 91 70 - 99 mg/dL   BUN 12 8 - 23 mg/dL   Creatinine, Ser 0.93 0.61 - 1.24 mg/dL   Calcium 8.1 (L) 8.9 - 10.3 mg/dL   Total Protein 7.4 6.5 - 8.1 g/dL   Albumin 2.4 (L) 3.5 - 5.0 g/dL   AST 106 (H) 15 - 41 U/L   ALT 43 0 - 44 U/L   Alkaline Phosphatase 122 38 - 126 U/L  Total Bilirubin 2.2 (H) 0.3 - 1.2 mg/dL   GFR calc non Af Amer >60 >60 mL/min   GFR calc Af Amer >60 >60 mL/min    Comment: (NOTE) The eGFR has been calculated using the CKD EPI equation. This calculation has not been validated in all clinical situations. eGFR's persistently <60 mL/min signify possible Chronic Kidney Disease.    Anion gap 7 5 - 15    Comment: Performed at [facility], [place] [address] [place], [place] [address]  CBC     Status: Abnormal   Collection Time: 08/22/18  2:14 PM  Result Value Ref Range   WBC 6.6 4.0 - 10.5 K/uL   RBC 2.60 (L) 4.22 - 5.81 MIL/uL   Hemoglobin 8.6 (L) 13.0 - 17.0 g/dL   HCT 27.3 (L) 39.0 - 52.0 %   MCV 105.0 (H) 80.0 - 100.0 fL   MCH 33.1 26.0 - 34.0 pg   MCHC 31.5 30.0 - 36.0 g/dL   RDW 14.7 11.5 - 15.5 %   Platelets 184 150 - 400 K/uL   nRBC 0.0 0.0 - 0.2 %    Comment: Performed at [facility], [place] [address],  [place], [place] [address]  Type and screen [place]     Status: None   Collection Time: 08/22/18  2:14 PM  Result Value Ref Range   ABO/RH(D) O NEG    Antibody Screen NEG    Sample Expiration      08/25/2018 Performed at [facility], [place] [address] [place], [place] [address]   CBC with Differential     Status: Abnormal   Collection Time: 08/22/18  8:41 PM  Result Value Ref Range   WBC 6.8 4.0 - 10.5 K/uL   RBC 2.24 (L) 4.22 - 5.81 MIL/uL   Hemoglobin 7.4 (L) 13.0 - 17.0 g/dL   HCT 23.3 (L) 39.0 - 52.0 %   MCV 104.0 (H) 80.0 - 100.0 fL   MCH 33.0 26.0 - 34.0 pg   MCHC 31.8 30.0 - 36.0 g/dL   RDW 14.6 11.5 - 15.5 %   Platelets 152 150 - 400 K/uL   nRBC 0.0 0.0 - 0.2 %   Neutrophils Relative % 53 %   Neutro Abs 3.6 1.7 - 7.7 K/uL   Lymphocytes Relative 34 %   Lymphs Abs 2.3 0.7 - 4.0 K/uL   Monocytes Relative 10 %   Monocytes Absolute 0.7 0.1 - 1.0 K/uL   Eosinophils Relative 2 %   Eosinophils Absolute 0.2 0.0 - 0.5 K/uL   Basophils Relative 1 %   Basophils Absolute 0.1 0.0 - 0.1 K/uL   WBC Morphology MORPHOLOGY UNREMARKABLE    Immature Granulocytes 0 %   Abs Immature Granulocytes 0.03 0.00 - 0.07 K/uL    Comment: Performed at [facility], [place] [address] [place], [place] [address]   No results found.  Pending Labs Unresulted Labs (From admission, onward)   None      Vitals/Pain Today's Vitals   08/22/18 1830 08/22/18 1859 08/22/18 1900 08/22/18 1926  BP:  126/87 126/87   Pulse:  97 99   Resp:   18   Temp:      TempSrc:      SpO2:  97% 99%   Weight:      Height:      PainSc: 5    5     Isolation Precautions No active isolations  Medications Medications  octreotide (SANDOSTATIN)  2 mcg/mL load via infusion 50 mcg (50 mcg Intravenous Bolus from Bag 08/22/18 2316)    And  octreotide (SANDOSTATIN) 500 mcg in sodium chloride 0.9 % 250 mL (2 mcg/mL) infusion (50 mcg/hr Intravenous New Bag/Given 08/22/18  2318)  morphine 4 MG/ML injection 4 mg (4 mg Intravenous Given 08/22/18 1804)  pantoprazole (PROTONIX) 80 mg in sodium chloride 0.9 % 100 mL IVPB (0 mg Intravenous Stopped 08/22/18 2241)    Mobility walks

## 2018-08-22 NOTE — ED Triage Notes (Signed)
Pt c/o RUQ pains, bloating and dark stools for couple days. Pt reports he was just seen here last Thursday.
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
## 2018-08-23 ENCOUNTER — Inpatient Hospital Stay (HOSPITAL_COMMUNITY): Payer: Medicare HMO | Admitting: Certified Registered Nurse Anesthetist

## 2018-08-23 ENCOUNTER — Encounter (HOSPITAL_COMMUNITY)
Admission: EM | Disposition: A | Discharge status: Home Health | Payer: Self-pay | Source: Home / Self Care | Attending: Internal Medicine

## 2018-08-23 ENCOUNTER — Encounter (HOSPITAL_COMMUNITY): Payer: Self-pay

## 2018-08-23 ENCOUNTER — Other Ambulatory Visit: Payer: Self-pay

## 2018-08-23 DIAGNOSIS — Z800 Family history of malignant neoplasm of digestive organs: Secondary | ICD-10-CM | POA: Diagnosis not present

## 2018-08-23 DIAGNOSIS — I10 Essential (primary) hypertension: Secondary | ICD-10-CM | POA: Diagnosis present

## 2018-08-23 DIAGNOSIS — C220 Liver cell carcinoma: Secondary | ICD-10-CM | POA: Diagnosis not present

## 2018-08-23 DIAGNOSIS — K7031 Alcoholic cirrhosis of liver with ascites: Secondary | ICD-10-CM | POA: Diagnosis present

## 2018-08-23 DIAGNOSIS — R195 Other fecal abnormalities: Secondary | ICD-10-CM | POA: Diagnosis present

## 2018-08-23 DIAGNOSIS — Z8719 Personal history of other diseases of the digestive system: Secondary | ICD-10-CM | POA: Diagnosis not present

## 2018-08-23 DIAGNOSIS — K922 Gastrointestinal hemorrhage, unspecified: Secondary | ICD-10-CM

## 2018-08-23 DIAGNOSIS — D62 Acute posthemorrhagic anemia: Secondary | ICD-10-CM | POA: Diagnosis present

## 2018-08-23 DIAGNOSIS — F1010 Alcohol abuse, uncomplicated: Secondary | ICD-10-CM | POA: Diagnosis not present

## 2018-08-23 DIAGNOSIS — I8511 Secondary esophageal varices with bleeding: Secondary | ICD-10-CM | POA: Diagnosis present

## 2018-08-23 DIAGNOSIS — Z8619 Personal history of other infectious and parasitic diseases: Secondary | ICD-10-CM

## 2018-08-23 DIAGNOSIS — Z886 Allergy status to analgesic agent status: Secondary | ICD-10-CM | POA: Diagnosis not present

## 2018-08-23 DIAGNOSIS — D696 Thrombocytopenia, unspecified: Secondary | ICD-10-CM | POA: Diagnosis present

## 2018-08-23 DIAGNOSIS — I8501 Esophageal varices with bleeding: Secondary | ICD-10-CM

## 2018-08-23 DIAGNOSIS — B1920 Unspecified viral hepatitis C without hepatic coma: Secondary | ICD-10-CM | POA: Diagnosis present

## 2018-08-23 DIAGNOSIS — K3189 Other diseases of stomach and duodenum: Secondary | ICD-10-CM | POA: Diagnosis present

## 2018-08-23 DIAGNOSIS — N400 Enlarged prostate without lower urinary tract symptoms: Secondary | ICD-10-CM | POA: Insufficient documentation

## 2018-08-23 DIAGNOSIS — Z8249 Family history of ischemic heart disease and other diseases of the circulatory system: Secondary | ICD-10-CM | POA: Diagnosis not present

## 2018-08-23 DIAGNOSIS — Z8505 Personal history of malignant neoplasm of liver: Secondary | ICD-10-CM | POA: Diagnosis not present

## 2018-08-23 DIAGNOSIS — F17210 Nicotine dependence, cigarettes, uncomplicated: Secondary | ICD-10-CM | POA: Diagnosis present

## 2018-08-23 DIAGNOSIS — Z79899 Other long term (current) drug therapy: Secondary | ICD-10-CM | POA: Diagnosis not present

## 2018-08-23 DIAGNOSIS — K209 Esophagitis, unspecified: Secondary | ICD-10-CM | POA: Diagnosis present

## 2018-08-23 DIAGNOSIS — F1020 Alcohol dependence, uncomplicated: Secondary | ICD-10-CM | POA: Diagnosis present

## 2018-08-23 DIAGNOSIS — D689 Coagulation defect, unspecified: Secondary | ICD-10-CM | POA: Diagnosis present

## 2018-08-23 HISTORY — PX: ESOPHAGEAL BANDING: SHX5518

## 2018-08-23 HISTORY — PX: ESOPHAGOGASTRODUODENOSCOPY (EGD) WITH PROPOFOL: SHX5813

## 2018-08-23 LAB — HEPATIC FUNCTION PANEL
ALK PHOS: 98 U/L (ref 38–126)
ALT: 35 U/L (ref 0–44)
AST: 84 U/L — ABNORMAL HIGH (ref 15–41)
Albumin: 2.1 g/dL — ABNORMAL LOW (ref 3.5–5.0)
BILIRUBIN DIRECT: 0.9 mg/dL — AB (ref 0.0–0.2)
BILIRUBIN INDIRECT: 1.7 mg/dL — AB (ref 0.3–0.9)
BILIRUBIN TOTAL: 2.6 mg/dL — AB (ref 0.3–1.2)
Total Protein: 6.5 g/dL (ref 6.5–8.1)

## 2018-08-23 LAB — CBC
HCT: 22.2 % — ABNORMAL LOW (ref 39.0–52.0)
HEMATOCRIT: 23.6 % — AB (ref 39.0–52.0)
HEMATOCRIT: 25.7 % — AB (ref 39.0–52.0)
HEMOGLOBIN: 7 g/dL — AB (ref 13.0–17.0)
HEMOGLOBIN: 7.5 g/dL — AB (ref 13.0–17.0)
Hemoglobin: 8 g/dL — ABNORMAL LOW (ref 13.0–17.0)
MCH: 32.8 pg (ref 26.0–34.0)
MCH: 33.2 pg (ref 26.0–34.0)
MCH: 33.3 pg (ref 26.0–34.0)
MCHC: 31.1 g/dL (ref 30.0–36.0)
MCHC: 31.5 g/dL (ref 30.0–36.0)
MCHC: 31.8 g/dL (ref 30.0–36.0)
MCV: 104.9 fL — ABNORMAL HIGH (ref 80.0–100.0)
MCV: 105.2 fL — AB (ref 80.0–100.0)
MCV: 105.3 fL — ABNORMAL HIGH (ref 80.0–100.0)
NRBC: 0 % (ref 0.0–0.2)
Platelets: 124 10*3/uL — ABNORMAL LOW (ref 150–400)
Platelets: 135 10*3/uL — ABNORMAL LOW (ref 150–400)
Platelets: 158 10*3/uL (ref 150–400)
RBC: 2.11 MIL/uL — AB (ref 4.22–5.81)
RBC: 2.25 MIL/uL — ABNORMAL LOW (ref 4.22–5.81)
RBC: 2.44 MIL/uL — AB (ref 4.22–5.81)
RDW: 14.6 % (ref 11.5–15.5)
RDW: 14.6 % (ref 11.5–15.5)
RDW: 14.7 % (ref 11.5–15.5)
WBC: 5.3 10*3/uL (ref 4.0–10.5)
WBC: 5.3 10*3/uL (ref 4.0–10.5)
WBC: 7 10*3/uL (ref 4.0–10.5)
nRBC: 0 % (ref 0.0–0.2)
nRBC: 0 % (ref 0.0–0.2)

## 2018-08-23 LAB — PROTIME-INR
INR: 1.43
Prothrombin Time: 17.3 seconds — ABNORMAL HIGH (ref 11.4–15.2)

## 2018-08-23 LAB — PREPARE RBC (CROSSMATCH)

## 2018-08-23 SURGERY — ESOPHAGOGASTRODUODENOSCOPY (EGD) WITH PROPOFOL
Anesthesia: Monitor Anesthesia Care

## 2018-08-23 MED ORDER — PROPOFOL 10 MG/ML IV BOLUS
INTRAVENOUS | Status: AC
  Filled 2018-08-23: qty 20

## 2018-08-23 MED ORDER — SODIUM CHLORIDE 0.9% IV SOLUTION
Freq: Once | INTRAVENOUS | Status: AC
  Administered 2018-08-23: 10:00:00 via INTRAVENOUS

## 2018-08-23 MED ORDER — PROPOFOL 10 MG/ML IV BOLUS
INTRAVENOUS | Status: AC
  Filled 2018-08-23: qty 40

## 2018-08-23 MED ORDER — PROPOFOL 10 MG/ML IV BOLUS
INTRAVENOUS | Status: DC | PRN
  Administered 2018-08-23 (×3): 20 mg via INTRAVENOUS

## 2018-08-23 MED ORDER — PANTOPRAZOLE SODIUM 40 MG IV SOLR: 40.0000 mg | Freq: Two times a day (BID) | INTRAVENOUS | Status: DC

## 2018-08-23 MED ORDER — NADOLOL 40 MG PO TABS: 40.0000 mg | ORAL_TABLET | Freq: Two times a day (BID) | ORAL | Status: DC

## 2018-08-23 MED ORDER — TAMSULOSIN HCL 0.4 MG PO CAPS: 0.4000 mg | ORAL_CAPSULE | Freq: Every day | ORAL | Status: DC

## 2018-08-23 MED ORDER — MORPHINE SULFATE (PF) 2 MG/ML IV SOLN
1.0000 mg | INTRAVENOUS | Status: DC | PRN
  Administered 2018-08-23 – 2018-08-25 (×3): 1 mg via INTRAVENOUS
  Filled 2018-08-23 (×3): qty 1

## 2018-08-23 MED ORDER — LIDOCAINE 2% (20 MG/ML) 5 ML SYRINGE
INTRAMUSCULAR | Status: DC | PRN
  Administered 2018-08-23: 60 mg via INTRAVENOUS

## 2018-08-23 MED ORDER — PROPOFOL 500 MG/50ML IV EMUL
INTRAVENOUS | Status: DC | PRN
  Administered 2018-08-23: 125 ug/kg/min via INTRAVENOUS

## 2018-08-23 SURGICAL SUPPLY — 15 items

## 2018-08-23 NOTE — Anesthesia Postprocedure Evaluation (Signed)
Anesthesia Post Note  Patient: Peter Nelson  Procedure(s) Performed: ESOPHAGOGASTRODUODENOSCOPY (EGD) WITH PROPOFOL ([name] ) ESOPHAGEAL BANDING     Patient location during evaluation: PACU Anesthesia Type: MAC Level of consciousness: awake and alert Pain management: pain level controlled Vital Signs Assessment: post-procedure vital signs reviewed and stable Respiratory status: spontaneous breathing, nonlabored ventilation and respiratory function stable Cardiovascular status: blood pressure returned to baseline and stable Postop Assessment: no apparent nausea or vomiting Anesthetic complications: no    Last Vitals:  Vitals:   08/23/18 1110 08/23/18 1150  BP: (!) 153/77 (!) 168/94  Pulse: 86 90  Resp: 13 20  Temp:    SpO2: 99% 100%    Last Pain:  Vitals:   08/23/18 1150  TempSrc:   PainSc: 0-No pain                 Brennan Bailey

## 2018-08-23 NOTE — Progress Notes (Signed)
PROGRESS NOTE    Peter Nelson  WUG:[id number] DOB: [birth year] DOA: 08/22/2018 PCP: Patient, No Pcp Per   Brief Narrative: Peter Nelson is a 67 y.o. male with medical history significant of liver cirrhosis secondary to hepatitis C and alcohol abuse, history of hepatocellular carcinoma status post ablation in March 2015, history of ascites, history of esophageal variceal bleeding in the past with banding, hypertension presenting to the hospital for evaluation of dark stools.  Patient reports having dark stools for the past 2 days in addition to mild generalized abdominal pain.  Denies having any nausea, vomiting, fevers, or chills.  States he does not take any blood thinners.  He continues to drink alcohol on a daily basis-1/2 pint of gin each time.  He last consumed alcohol 2 days ago.  Patient was recently admitted from August 07, 2018 to August 10, 2018 for hematemesis secondary to Mallory-Weiss tear.  5 clips were placed.  ED Course: On arrival, afebrile, tachycardic, and blood pressure stable.  Initial hemoglobin 8.6, repeat 7.4.  ED physician discussed the case with Dr. Fuller Plan from [place], GI will see the patient in the morning.  Patient received IV octreotide 50 mcg bolus followed by infusion and Protonix 80 mg IV in the ED.  TR[facility] paged to admit.   Assessment & Plan:   Principal Problem:   Bleeding esophageal varices (HCC) Active Problems:   Alcohol abuse   Alcoholic cirrhosis of liver (HCC)   Hepatocellular carcinoma (HCC)   Acute GI bleeding   History of hepatitis C   HTN (hypertension)  1-Acute GI bleed, upper. Presents with melena.  IV protonix, octreotide.  Patient to received one unit PRBC>  GI planning endoscopy today.  IV ceftriaxone.   2-Cirrhosis, history of [place].  Per GI plan to consult IR>  Holding spironolactone due to GI bleed.   3-Alcohol abuse;  Counseling provided.  CIWA, ativan.   Acute blood loss anemia;  Secondary to GI bleed.  Getting one  unit PRBC>   HTN; holding diuretic and atenolol.   mild thrombocytopenia; monitor. In setting alcohol use.   RN Pressure Injury Documentation:    Malnutrition Type:      Malnutrition Characteristics:      Nutrition Interventions:     Estimated body mass index is 21.31 kg/m as calculated from the following:   Height as of this encounter: 5' 6"  (1.676 m).   Weight as of this encounter: 59.9 kg.   DVT prophylaxis: scd.  Code Status: full code.  Family Communication: no family at bedside, during my evaluation.  Disposition Plan: remain inpatient   Consultants:   GI   Procedures:   Endoscopy    Antimicrobials:  ceftriaxone   Subjective: Alert, report melena. Still drinking alcohol.   Objective: Vitals:   08/23/18 1205 08/23/18 1210 08/23/18 1220 08/23/18 1245  BP: (!) 175/93 (!) 159/94 (!) 166/92 (!) 168/95  Pulse: 85 83 76 81  Resp: 18 17 17 16   Temp:    98.2 F (36.8 C)  TempSrc:    Oral  SpO2: 98% 100% 99% 96%  Weight:      Height:        Intake/Output Summary (Last 24 hours) at 08/23/2018 1756 Last data filed at 08/23/2018 1500 Gross per 24 hour  Intake 1505.16 ml  Output -  Net 1505.16 ml   Filed Weights   08/22/18 1355  Weight: 59.9 kg    Examination:  General exam: Appears calm and comfortable  Respiratory system: Clear  to auscultation. Respiratory effort normal. Cardiovascular system: S1 & S2 heard, RRR. No JVD, murmurs, rubs, gallops or clicks. No pedal edema. Gastrointestinal system: distended, Mild tenderness Central nervous system: Alert and oriented. No focal neurological deficits. Extremities: Symmetric 5 x 5 power. Skin: No rashes, lesions or ulcers Psychiatry: Judgement and insight appear normal. Mood & affect appropriate.     Data Reviewed: I have personally reviewed following labs and imaging studies  CBC: Recent Labs  Lab 08/22/18 1414 08/22/18 2041 08/23/18 0022 08/23/18 0507 08/23/18 0850  WBC 6.6 6.8  7.0 5.3 5.3  NEUTROABS  --  3.6  --   --   --   HGB 8.6* 7.4* 8.0* 7.0* 7.5*  HCT 27.3* 23.3* 25.7* 22.2* 23.6*  MCV 105.0* 104.0* 105.3* 105.2* 104.9*  PLT 184 152 158 124* 939*   Basic Metabolic Panel: Recent Labs  Lab 08/22/18 1414  NA 139  K 3.9  CL 110  CO2 22  GLUCOSE 91  BUN 12  CREATININE 0.93  CALCIUM 8.1*   GFR: Estimated Creatinine Clearance: 65.3 mL/min (by C-G formula based on SCr of 0.93 mg/dL). Liver Function Tests: Recent Labs  Lab 08/22/18 1414 08/23/18 0507  AST 106* 84*  ALT 43 35  ALKPHOS 122 98  BILITOT 2.2* 2.6*  PROT 7.4 6.5  ALBUMIN 2.4* 2.1*   No results for input(s): LIPASE, AMYLASE in the last 168 hours. No results for input(s): AMMONIA in the last 168 hours. Coagulation Profile: Recent Labs  Lab 08/23/18 1555  INR 1.43   Cardiac Enzymes: No results for input(s): CKTOTAL, CKMB, CKMBINDEX, TROPONINI in the last 168 hours. BNP (last 3 results) No results for input(s): PROBNP in the last 8760 hours. HbA1C: No results for input(s): HGBA1C in the last 72 hours. CBG: No results for input(s): GLUCAP in the last 168 hours. Lipid Profile: No results for input(s): CHOL, HDL, LDLCALC, TRIG, CHOLHDL, LDLDIRECT in the last 72 hours. Thyroid Function Tests: No results for input(s): TSH, T4TOTAL, FREET4, T3FREE, THYROIDAB in the last 72 hours. Anemia Panel: No results for input(s): VITAMINB12, FOLATE, FERRITIN, TIBC, IRON, RETICCTPCT in the last 72 hours. Sepsis Labs: No results for input(s): PROCALCITON, LATICACIDVEN in the last 168 hours.  No results found for this or any previous visit (from the past 240 hour(s)).       Radiology Studies: No results found.      Scheduled Meds: . folic acid  1 mg Oral Daily  . multivitamin with minerals  1 tablet Oral Daily  . pantoprazole (PROTONIX) IV  40 mg Intravenous Q12H  . thiamine  100 mg Oral Daily   Or  . thiamine  100 mg Intravenous Daily   Continuous Infusions: . cefTRIAXone  (ROCEPHIN)  IV Stopped (08/23/18 0118)  . octreotide  (SANDOSTATIN)    IV infusion 50 mcg/hr (08/23/18 0600)     LOS: 0 days    Time spent: 35 minutes,    Elmarie Shiley, MD [facility] Pager [phone]  If 7PM-7AM, please contact night-coverage [email or web address] Password [facility] 08/23/2018, 5:56 PM

## 2018-08-23 NOTE — Transfer of Care (Signed)
Immediate Anesthesia Transfer of Care Note  Patient: Peter Nelson  Procedure(s) Performed: ESOPHAGOGASTRODUODENOSCOPY (EGD) WITH PROPOFOL ([name] ) ESOPHAGEAL BANDING  Patient Location: Endoscopy Unit  Anesthesia Type:MAC  Level of Consciousness: drowsy  Airway & Oxygen Therapy: Patient Spontanous Breathing and Patient connected to nasal cannula oxygen  Post-op Assessment: Report given to RN and Post -op Vital signs reviewed and stable  Post vital signs: Reviewed and stable  Last Vitals:  Vitals Value Taken Time  BP    Temp    Pulse 93 08/23/2018 11:49 AM  Resp 21 08/23/2018 11:49 AM  SpO2 100 % 08/23/2018 11:49 AM  Vitals shown include unvalidated device data.  Last Pain:  Vitals:   08/23/18 1110  TempSrc:   PainSc: 0-No pain         Complications: No apparent anesthesia complications

## 2018-08-23 NOTE — Anesthesia Preprocedure Evaluation (Addendum)
Anesthesia Evaluation  Patient identified by MRN, date of birth, ID band Patient awake    Reviewed: Allergy & Precautions, NPO status , Patient's Chart, lab work & pertinent test results  History of Anesthesia Complications Negative for: history of anesthetic complications  Airway Mallampati: II  TM Distance: >3 FB Neck ROM: Full    Dental  (+) Loose, Chipped, Missing,    Pulmonary asthma , Current Smoker,    breath sounds clear to auscultation       Cardiovascular hypertension,  Rhythm:Regular Rate:Normal     Neuro/Psych negative neurological ROS  negative psych ROS   GI/Hepatic (+) Cirrhosis     substance abuse  alcohol use, Hepatitis -, CUpper GI bleed, h/o Mallory Weiss tear   Endo/Other  negative endocrine ROS  Renal/GU negative Renal ROS  negative genitourinary   Musculoskeletal  (+) Arthritis ,   Abdominal   Peds  Hematology  (+) anemia , Hgb 7.5   Anesthesia Other Findings UGIB, last drink yesterday morning  Reproductive/Obstetrics                          Anesthesia Physical  Anesthesia Plan  ASA: III  Anesthesia Plan: MAC   Post-op Pain Management:    Induction: Intravenous  PONV Risk Score and Plan: 0 and Treatment may vary due to age or medical condition and Propofol infusion  Airway Management Planned: Natural Airway and Simple Face Mask  Additional Equipment:   Intra-op Plan:   Post-operative Plan:   Informed Consent: I have reviewed the patients History and Physical, chart, labs and discussed the procedure including the risks, benefits and alternatives for the proposed anesthesia with the patient or authorized representative who has indicated his/her understanding and acceptance.   Dental advisory given  Plan Discussed with: CRNA  Anesthesia Plan Comments:       Anesthesia Quick Evaluation
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
## 2018-08-23 NOTE — Op Note (Signed)
[facility] Patient Name: Peter Nelson Procedure Date: 08/23/2018 MRN: [id number] Attending MD: Milus Banister , MD Date of Birth: Jan 04, 1951 CSN: [id number] Age: 67 Admit Type: Inpatient Procedure:                Upper GI endoscopy Indications:              Melena, anemia. Etoh/HCV cirrhosis, recent EGD Dr.                            Havery Moros found and treated bleeding MW tear                            (clips), also known esophageal varices. Providers:                Milus Banister, MD, Burtis Junes, RN, Elspeth Cho                            Tech., Technician, Dellie Catholic, CRNA Referring MD:              Medicines:                Monitored Anesthesia Care Complications:            No immediate complications. Estimated blood loss:                            None. Estimated Blood Loss:     Estimated blood loss: none. Procedure:                Pre-Anesthesia Assessment:                           - Prior to the procedure, a History and Physical                            was performed, and patient medications and                            allergies were reviewed. The patient's tolerance of                            previous anesthesia was also reviewed. The risks                            and benefits of the procedure and the sedation                            options and risks were discussed with the patient.                            All questions were answered, and informed consent                            was obtained. Prior Anticoagulants: The patient has  taken no previous anticoagulant or antiplatelet                            agents. ASA Grade Assessment: IV - A patient with                            severe systemic disease that is a constant threat                            to life. After reviewing the risks and benefits,                            the patient was deemed in satisfactory condition to         undergo the procedure.                           After obtaining informed consent, the endoscope was                            passed under direct vision. Throughout the                            procedure, the patient's blood pressure, pulse, and                            oxygen saturations were monitored continuously. The                            GIF-H190 ([id number]) Olympus adult endoscope was                            introduced through the mouth, and advanced to the                            second part of duodenum. The upper GI endoscopy was                            accomplished without difficulty. The patient                            tolerated the procedure well. Scope In: Scope Out: Findings:      No recent or old blood in the UGI tract.      The recently placed endoclips have all passed from the UGI tract.      Mild portal gastropathy changes.      Grade II varices were found in the lower third of the esophagus (with       evidence of previous banding). Seven bands were successfully placed with       complete eradication, resulting in deflation of varices. There was no       bleeding during the procedure.      The exam was otherwise without abnormality. Impression:               - No recent or old blood in the UGI tract.                           -  The recently placed endoclips have all passed                            from the UGI tract.                           - Mild portal gastropathy changes.                           - Grade II varices were found in the lower third of                            the esophagus (with evidence of previous banding).                            Seven bands were successfully placed with complete                            eradication, resulting in deflation of varices.                            There was no bleeding during the procedure. Moderate Sedation:      Not Applicable - Patient had care per Anesthesia. Recommendation:            - Return patient to hospital ward for ongoing care.                           - Continue octreotide for now.                           - Will change to IV PPI twice daily (from drip).                           - Clear liquids for now.                           - Follow blood counts.                           - Likely will need LVP tomorrow and also liver                            imaging (MRI) pending clinical course.                           - Continue CIWA protocol. Procedure Code(s):        --- Professional ---                           [id number], Esophagogastroduodenoscopy, flexible,                            transoral; with band ligation of esophageal/gastric  varices Diagnosis Code(s):        --- Professional ---                           I85.00, Esophageal varices without bleeding                           K92.1, Melena (includes Hematochezia) CPT copyright 2018 American Medical Association. All rights reserved. The codes documented in this report are preliminary and upon coder review may  be revised to meet current compliance requirements. Milus Banister, MD 08/23/2018 11:47:31 AM This report has been signed electronically. Number of Addenda: 0

## 2018-08-24 ENCOUNTER — Inpatient Hospital Stay (HOSPITAL_COMMUNITY): Payer: Medicare HMO

## 2018-08-24 ENCOUNTER — Encounter (HOSPITAL_COMMUNITY): Payer: Self-pay

## 2018-08-24 DIAGNOSIS — K7031 Alcoholic cirrhosis of liver with ascites: Principal | ICD-10-CM

## 2018-08-24 LAB — COMPREHENSIVE METABOLIC PANEL
ALT: 40 U/L (ref 0–44)
ANION GAP: 7 (ref 5–15)
AST: 95 U/L — ABNORMAL HIGH (ref 15–41)
Albumin: 2.5 g/dL — ABNORMAL LOW (ref 3.5–5.0)
Alkaline Phosphatase: 118 U/L (ref 38–126)
BUN: 18 mg/dL (ref 8–23)
CHLORIDE: 109 mmol/L (ref 98–111)
CO2: 23 mmol/L (ref 22–32)
Calcium: 8.3 mg/dL — ABNORMAL LOW (ref 8.9–10.3)
Creatinine, Ser: 1.11 mg/dL (ref 0.61–1.24)
GFR calc non Af Amer: >60 mL/min (ref >60)
Glucose, Bld: 101 mg/dL — ABNORMAL HIGH (ref 70–99)
POTASSIUM: 4 mmol/L (ref 3.5–5.1)
SODIUM: 139 mmol/L (ref 135–145)
Total Bilirubin: 2.9 mg/dL — ABNORMAL HIGH (ref 0.3–1.2)
Total Protein: 7.4 g/dL (ref 6.5–8.1)

## 2018-08-24 LAB — GRAM STAIN

## 2018-08-24 LAB — BODY FLUID CELL COUNT WITH DIFFERENTIAL
Lymphs, Fluid: 21 %
Monocyte-Macrophage-Serous Fluid: 78 % (ref 50–90)
Neutrophil Count, Fluid: 1 % (ref 0–25)
Total Nucleated Cell Count, Fluid: 36 cu mm (ref 0–1000)

## 2018-08-24 LAB — CBC
HCT: 32 % — ABNORMAL LOW (ref 39.0–52.0)
Hemoglobin: 10.1 g/dL — ABNORMAL LOW (ref 13.0–17.0)
MCH: 32.9 pg (ref 26.0–34.0)
MCHC: 31.6 g/dL (ref 30.0–36.0)
MCV: 104.2 fL — ABNORMAL HIGH (ref 80.0–100.0)
NRBC: 0 % (ref 0.0–0.2)
Platelets: 143 10*3/uL — ABNORMAL LOW (ref 150–400)
RBC: 3.07 MIL/uL — ABNORMAL LOW (ref 4.22–5.81)
RDW: 16.4 % — AB (ref 11.5–15.5)
WBC: 6.4 10*3/uL (ref 4.0–10.5)

## 2018-08-24 LAB — PROTIME-INR
INR: 1.42
PROTHROMBIN TIME: 17.2 s — AB (ref 11.4–15.2)

## 2018-08-24 MED ORDER — ALBUMIN HUMAN 25 % IV SOLN
25.0000 g | Freq: Once | INTRAVENOUS | Status: DC
  Filled 2018-08-24: qty 100

## 2018-08-24 MED ORDER — OXYCODONE HCL 5 MG PO TABS
5.0000 mg | ORAL_TABLET | Freq: Four times a day (QID) | ORAL | Status: DC | PRN
  Administered 2018-08-24: 5 mg via ORAL
  Filled 2018-08-24: qty 1

## 2018-08-24 MED ORDER — LIDOCAINE HCL 1 % IJ SOLN
INTRAMUSCULAR | Status: AC
  Filled 2018-08-24: qty 10

## 2018-08-24 NOTE — Plan of Care (Signed)
Pt tolerating CLD. Pt calm and cooperative throughout shift.

## 2018-08-24 NOTE — Progress Notes (Signed)
     [facility] Progress Note   Chief Complaint: GI bleed, cirrhosis      SUBJECTIVE:    Chest discomfort since EGD. Discomfort is constant, not just related to PO. No melena - no BM in two days.     ASSESSMENT AND PLAN:   16. 67 yo M with decompensated ETOH / HCV cirrhosis. Ongoing ETOH use. Admitted with melena / anemia -EGD yesterday >>> no stigmata of recent bleeding. Grade II varices , banded x7 with complete eradication. No evidence for the [employer] tear, or clips placed to close it on EGD 08/08/18).  -Hgb up from 7.5 to 10 after just one unit of blood. Expect he will settle back to around basline of 8 soon -on CIWA -Today will complete 3rd day of Octreotide -continue BID PPI -Continue Rocephin -Will arrange for LVP with cell count, gram stain and cytology. Renal function is okay. Albumin ordered if greater than 4 liters removed.    2. Hx of [place] s/p ablation in 2015. Now with marked elevated AFP and suggestion of multifocal HCC  (vrs metastatic dz) on u/s 08/09/18 -eventual MRI for further assessment of liver lesions.    OBJECTIVE:     Vital signs in last 24 hours: Temp:  [97.9 F (36.6 C)-99.1 F (37.3 C)] 98.5 F (36.9 C) (11/07 0534) Pulse Rate:  [76-97] 84 (11/07 0534) Resp:  [13-20] 20 (11/07 0534) BP: (129-175)/(72-95) 141/86 (11/07 0534) SpO2:  [95 %-100 %] 97 % (11/07 0534) Last BM Date: 08/22/18(per pt report) General:   Alert, thin male in NAD. Male family member in room [facility] employee) Heart:  Regular rate and rhythm, no lower extremity edema   Pulm: Normal respiratory effort, lungs Abdomen:  Soft, largely distended, nontender.  Normal bowel sounds       Neurologic:  Alert and  oriented x4;  grossly normal neurologically. Psych:  Cooperative, flat affect.   Intake/Output from previous day: 11/06 0701 - 11/07 0700 In: 587.2 [I.V.:210.4; Blood:376.8] Out: -  Intake/Output this shift: No intake/output data recorded.  Lab  Results: Recent Labs    08/23/18 0507 08/23/18 0850 08/24/18 0618  WBC 5.3 5.3 6.4  HGB 7.0* 7.5* 10.1*  HCT 22.2* 23.6* 32.0*  PLT 124* 135* 143*   BMET Recent Labs    08/22/18 1414 08/24/18 0618  NA 139 139  K 3.9 4.0  CL 110 109  CO2 22 23  GLUCOSE 91 101*  BUN 12 18  CREATININE 0.93 1.11  CALCIUM 8.1* 8.3*   LFT Recent Labs    08/23/18 0507 08/24/18 0618  PROT 6.5 7.4  ALBUMIN 2.1* 2.5*  AST 84* 95*  ALT 35 40  ALKPHOS 98 118  BILITOT 2.6* 2.9*  BILIDIR 0.9*  --   IBILI 1.7*  --    PT/INR Recent Labs    08/23/18 1555 08/24/18 0618  LABPROT 17.3* 17.2*  INR 1.43 1.42   Principal Problem:   Bleeding esophageal varices (HCC) Active Problems:   Alcohol abuse   Alcoholic cirrhosis of liver (HCC)   Hepatocellular carcinoma (HCC)   Acute GI bleeding   History of hepatitis C   HTN (hypertension)     LOS: 1 day   Tye Savoy ,NP 08/24/2018, 10:04 AM

## 2018-08-24 NOTE — Progress Notes (Signed)
PROGRESS NOTE    Peter Nelson  XJO:[id number] DOB: Nov 03, 1950 DOA: 08/22/2018 PCP: Patient, No Pcp Per   Brief Narrative: Peter Nelson is a 67 y.o. male with medical history significant of liver cirrhosis secondary to hepatitis C and alcohol abuse, history of hepatocellular carcinoma status post ablation in March 2015, history of ascites, history of esophageal variceal bleeding in the past with banding, hypertension presenting to the hospital for evaluation of dark stools.  Patient reports having dark stools for the past 2 days in addition to mild generalized abdominal pain.  Denies having any nausea, vomiting, fevers, or chills.  States he does not take any blood thinners.  He continues to drink alcohol on a daily basis-1/2 pint of gin each time.  He last consumed alcohol 2 days ago.  Patient was recently admitted from August 07, 2018 to August 10, 2018 for hematemesis secondary to Mallory-Weiss tear.  5 clips were placed.  ED Course: On arrival, afebrile, tachycardic, and blood pressure stable.  Initial hemoglobin 8.6, repeat 7.4.  ED physician discussed the case with Dr. Fuller Plan from [facility], GI will see the patient in the morning.  Patient received IV octreotide 50 mcg bolus followed by infusion and Protonix 80 mg IV in the ED.  TRH paged to admit.   Assessment & Plan:   Principal Problem:   Bleeding esophageal varices (HCC) Active Problems:   Alcohol abuse   Alcoholic cirrhosis of liver (HCC)   Hepatocellular carcinoma (HCC)   Acute GI bleeding   History of hepatitis C   HTN (hypertension)  1-Acute GI bleed, upper. Presents with melena.   on IV protonix, octreotide.  Patient to received one unit PRBC> 11-06 Underwent endoscopy and banding of grade II varices. 11-06 IV ceftriaxone prophylaxis.  Hb up to 10. Suspect hemoconcentration. Marland Kitchen   2-Cirrhosis, history of [place].  Per GI plan to consult IR>  Holding spironolactone due to GI bleed.  US paracentesis today.,    3-Alcohol abuse;  Counseling provided.  CIWA, ativan.   Acute blood loss anemia;  Secondary to GI bleed.  S/P one unit PRBC>  Hb stable.   HTN; holding diuretic and atenolol.   mild thrombocytopenia; monitor. In setting alcohol use.   RN Pressure Injury Documentation:    Malnutrition Type:      Malnutrition Characteristics:      Nutrition Interventions:     Estimated body mass index is 21.31 kg/m as calculated from the following:   Height as of this encounter: 5' 6"  (1.676 m).   Weight as of this encounter: 59.9 kg.   DVT prophylaxis: scd.  Code Status: full code.  Family Communication: no family at bedside, during my evaluation.  Disposition Plan: remain inpatient   Consultants:   GI   Procedures:   Endoscopy    Antimicrobials:  ceftriaxone   Subjective: Still complaining of abdominal pain.    Objective: Vitals:   08/23/18 1220 08/23/18 1245 08/23/18 2138 08/24/18 0534  BP: (!) 166/92 (!) 168/95 (!) 160/90 (!) 141/86  Pulse: 76 81 97 84  Resp: 17 16 16 20   Temp:  98.2 F (36.8 C) 98.5 F (36.9 C) 98.5 F (36.9 C)  TempSrc:  Oral Oral Oral  SpO2: 99% 96% 99% 97%  Weight:      Height:        Intake/Output Summary (Last 24 hours) at 08/24/2018 1548 Last data filed at 08/24/2018 1000 Gross per 24 hour  Intake 769.02 ml  Output -  Net 769.02  ml   Filed Weights   08/22/18 1355  Weight: 59.9 kg    Examination:  General exam: NAD Respiratory system: CTA. Cardiovascular system: s 1, S 2 RRR Gastrointestinal system: Distended. Mild tenderness Central nervous system: alert  Extremities: no edema  Data Reviewed: I have personally reviewed following labs and imaging studies  CBC: Recent Labs  Lab 08/22/18 2041 08/23/18 0022 08/23/18 0507 08/23/18 0850 08/24/18 0618  WBC 6.8 7.0 5.3 5.3 6.4  NEUTROABS 3.6  --   --   --   --   HGB 7.4* 8.0* 7.0* 7.5* 10.1*  HCT 23.3* 25.7* 22.2* 23.6* 32.0*  MCV 104.0* 105.3* 105.2*  104.9* 104.2*  PLT 152 158 124* 135* 031*   Basic Metabolic Panel: Recent Labs  Lab 08/22/18 1414 08/24/18 0618  NA 139 139  K 3.9 4.0  CL 110 109  CO2 22 23  GLUCOSE 91 101*  BUN 12 18  CREATININE 0.93 1.11  CALCIUM 8.1* 8.3*   GFR: Estimated Creatinine Clearance: 54.7 mL/min (by C-G formula based on SCr of 1.11 mg/dL). Liver Function Tests: Recent Labs  Lab 08/22/18 1414 08/23/18 0507 08/24/18 0618  AST 106* 84* 95*  ALT 43 35 40  ALKPHOS 122 98 118  BILITOT 2.2* 2.6* 2.9*  PROT 7.4 6.5 7.4  ALBUMIN 2.4* 2.1* 2.5*   No results for input(s): LIPASE, AMYLASE in the last 168 hours. No results for input(s): AMMONIA in the last 168 hours. Coagulation Profile: Recent Labs  Lab 08/23/18 1555 08/24/18 0618  INR 1.43 1.42   Cardiac Enzymes: No results for input(s): CKTOTAL, CKMB, CKMBINDEX, TROPONINI in the last 168 hours. BNP (last 3 results) No results for input(s): PROBNP in the last 8760 hours. HbA1C: No results for input(s): HGBA1C in the last 72 hours. CBG: No results for input(s): GLUCAP in the last 168 hours. Lipid Profile: No results for input(s): CHOL, HDL, LDLCALC, TRIG, CHOLHDL, LDLDIRECT in the last 72 hours. Thyroid Function Tests: No results for input(s): TSH, T4TOTAL, FREET4, T3FREE, THYROIDAB in the last 72 hours. Anemia Panel: No results for input(s): VITAMINB12, FOLATE, FERRITIN, TIBC, IRON, RETICCTPCT in the last 72 hours. Sepsis Labs: No results for input(s): PROCALCITON, LATICACIDVEN in the last 168 hours.  No results found for this or any previous visit (from the past 240 hour(s)).       Radiology Studies: No results found.      Scheduled Meds: . folic acid  1 mg Oral Daily  . multivitamin with minerals  1 tablet Oral Daily  . pantoprazole (PROTONIX) IV  40 mg Intravenous Q12H  . thiamine  100 mg Oral Daily   Or  . thiamine  100 mg Intravenous Daily   Continuous Infusions: . albumin human    . cefTRIAXone (ROCEPHIN)  IV  1 g (08/23/18 2348)  . octreotide  (SANDOSTATIN)    IV infusion 50 mcg/hr (08/24/18 0846)     LOS: 1 day    Time spent: 35 minutes,    Elmarie Shiley, MD [facility] Pager [phone]  If 7PM-7AM, please contact night-coverage [email or web address] Password [facility] 08/24/2018, 3:48 PM

## 2018-08-24 NOTE — Procedures (Signed)
PROCEDURE SUMMARY:  Successful image-guided paracentesis from the right lower abdomen.  Yielded 1.8 liters of clear yellow fluid.  No immediate complications.  Patient tolerated well.   Specimen was sent for labs.  Joaquim Nam PA-C 08/24/2018 4:58 PM

## 2018-08-25 LAB — BASIC METABOLIC PANEL
Anion gap: 5 (ref 5–15)
BUN: 14 mg/dL (ref 8–23)
CHLORIDE: 107 mmol/L (ref 98–111)
CO2: 23 mmol/L (ref 22–32)
CREATININE: 1.15 mg/dL (ref 0.61–1.24)
Calcium: 7.8 mg/dL — ABNORMAL LOW (ref 8.9–10.3)
GFR calc non Af Amer: >60 mL/min (ref >60)
Glucose, Bld: 133 mg/dL — ABNORMAL HIGH (ref 70–99)
Potassium: 3.9 mmol/L (ref 3.5–5.1)
Sodium: 135 mmol/L (ref 135–145)

## 2018-08-25 LAB — CBC
HCT: 29.7 % — ABNORMAL LOW (ref 39.0–52.0)
Hemoglobin: 9.5 g/dL — ABNORMAL LOW (ref 13.0–17.0)
MCH: 33.7 pg (ref 26.0–34.0)
MCHC: 32 g/dL (ref 30.0–36.0)
MCV: 105.3 fL — AB (ref 80.0–100.0)
NRBC: 0 % (ref 0.0–0.2)
Platelets: 121 10*3/uL — ABNORMAL LOW (ref 150–400)
RBC: 2.82 MIL/uL — ABNORMAL LOW (ref 4.22–5.81)
RDW: 15.9 % — ABNORMAL HIGH (ref 11.5–15.5)
WBC: 6.3 10*3/uL (ref 4.0–10.5)

## 2018-08-25 MED ORDER — POLYETHYLENE GLYCOL 3350 17 G PO PACK
17.0000 g | PACK | Freq: Every day | ORAL | Status: DC
  Administered 2018-08-25 – 2018-08-26 (×2): 17 g via ORAL
  Filled 2018-08-25 (×2): qty 1

## 2018-08-25 MED ORDER — PANTOPRAZOLE SODIUM 40 MG PO TBEC
40.0000 mg | DELAYED_RELEASE_TABLET | Freq: Every day | ORAL | Status: DC
  Administered 2018-08-26: 40 mg via ORAL
  Filled 2018-08-25: qty 1

## 2018-08-25 MED ORDER — NADOLOL 20 MG PO TABS
20.0000 mg | ORAL_TABLET | Freq: Every day | ORAL | Status: DC
  Administered 2018-08-25 – 2018-08-26 (×2): 20 mg via ORAL
  Filled 2018-08-25 (×2): qty 1

## 2018-08-25 NOTE — Progress Notes (Signed)
MRI was unable to fit the pt into their schedule tonight. This RN was given instructions to have pt NPO after 3am and they would preform MRI in the morning.

## 2018-08-25 NOTE — Progress Notes (Addendum)
Patient ID: Peter Nelson, male   DOB: 06-Apr-1951, 67 y.o.   MRN: [id number]    Progress Note   Subjective    family in room , updated , and questions answered. Pt continue to have pain in epigastrium - meds help - No CP post banding yesterday . Saye the paracentesis took some pressure off abdomen. No melena  He is hungry , wants to eat .  HGB stable at 10.1  LVP  yesterday 1.8 liters , no evidence  for SBP- Cytology pending  Recent  AFP 59,000 + !!     Objective   Vital signs in last 24 hours: Temp:  [98.9 F (37.2 C)-99.3 F (37.4 C)] 99.3 F (37.4 C) (11/08 0517) Pulse Rate:  [70-74] 70 (11/08 0517) Resp:  [16] 16 (11/08 0517) BP: (124-146)/(70-87) 133/74 (11/08 0517) SpO2:  [99 %] 99 % (11/08 0517) Last BM Date: 08/23/18 General:     Thin older AA male  in NAD Heart:  Regular rate and rhythm; no murmurs Lungs: Respirations even and unlabored, lungs CTA bilaterally Abdomen:  Soft, tender  Epigastrium and nontense ascites  Extremities:  Without edema. Neurologic:  Alert and oriented,  grossly normal neurologically. Psych:  Cooperative. Normal mood and affect.  Intake/Output from previous day: 11/07 0701 - 11/08 0700 In: 1839 [P.O.:830; I.V.:809; IV Piggyback:200] Out: -  Intake/Output this shift: No intake/output data recorded.  Lab Results: Recent Labs    08/23/18 0507 08/23/18 0850 08/24/18 0618  WBC 5.3 5.3 6.4  HGB 7.0* 7.5* 10.1*  HCT 22.2* 23.6* 32.0*  PLT 124* 135* 143*   BMET Recent Labs    08/22/18 1414 08/24/18 0618  NA 139 139  K 3.9 4.0  CL 110 109  CO2 22 23  GLUCOSE 91 101*  BUN 12 18  CREATININE 0.93 1.11  CALCIUM 8.1* 8.3*   LFT Recent Labs    08/23/18 0507 08/24/18 0618  PROT 6.5 7.4  ALBUMIN 2.1* 2.5*  AST 84* 95*  ALT 35 40  ALKPHOS 98 118  BILITOT 2.6* 2.9*  BILIDIR 0.9*  --   IBILI 1.7*  --    PT/INR Recent Labs    08/23/18 1555 08/24/18 0618  LABPROT 17.3* 17.2*  INR 1.43 1.42    Studies/Results: US  Paracentesis  Result Date: 08/24/2018 INDICATION: Patient with history of [place], esophageal varices s/p banding, liver cirrhosis secondary to ETOH abuse and hepatitis C with recurrent ascites. Request for diagnostic and therapeutic paracentesis today. EXAM: ULTRASOUND GUIDED DIAGNOSTIC AND THERAPEUTIC PARACENTESIS MEDICATIONS: 10 mL 1% lidocaine. COMPLICATIONS: None immediate. PROCEDURE: Informed written consent was obtained from the patient after a discussion of the risks, benefits and alternatives to treatment. A timeout was performed prior to the initiation of the procedure. Initial ultrasound scanning demonstrates a moderate amount of ascites within the right lower abdominal quadrant. The right lower abdomen was prepped and draped in the usual sterile fashion. 1% lidocaine was used for local anesthesia. Following this, a 19 gauge, 7-cm, Yueh catheter was introduced. An ultrasound image was saved for documentation purposes. The paracentesis was performed. The catheter was removed and a dressing was applied. The patient tolerated the procedure well without immediate post procedural complication. FINDINGS: A total of approximately 1.8 L of clear yellow fluid was removed. Samples were sent to the laboratory as requested by the clinical team. IMPRESSION: Successful ultrasound-guided paracentesis yielding 1.8 liters of peritoneal fluid. Read by Peter Norse, PA-C Electronically Signed   By: Peter Nelson.D.  On: 08/24/2018 17:18       Assessment / Plan:    #1 67 yo  AA male with HX of Peter Nelson ,dx 2015, s/p ablation , also with decompensated ETOH/Hep C Cirrhosis  Admitted with melena., and new severe epigastric pain, abdominal / distention  EGD yesterday with banding of varices x 7 Has tolerated well, no further active bleeding  Will stop Octreotide  today  Advance diet Add Corgard   #2  Anemia - secondary to blood loss - stable #3 recurrence of ascites - no SPB - await cytology  #4  HCC - US  shows progression   C/w multi focal  Hepatoma  His pain is from liver masses - will need narcotics  For home Will order MRI  Abd/liver today - then ask IR  If they can offer any therapy/ablation He has appt with Peter Nelson Peter Nelson next week - discussed with family , and they will go with him  #5 ETOH abuse - no active withdrawal      Contact  Peter Nelson, P.A.-C               [phone]      Principal Problem:   Bleeding esophageal varices ([name]) Active Problems:   Alcohol abuse   Alcoholic cirrhosis of liver ([name])   Hepatocellular carcinoma ([name])   Acute GI bleeding   History of hepatitis C   HTN (hypertension)     LOS: 2 days   Peter Nelson  08/25/2018, 8:50 AM

## 2018-08-25 NOTE — Evaluation (Signed)
Physical Therapy Evaluation Patient Details Name: Peter Nelson MRN: [id number] DOB: Aug 01, 1951 Today's Date: 08/25/2018   History of Present Illness  67 yo male admitted to ED on 11/5 for bloody stools, abdominal pain. Pt s/p banding of grade II varices in lower 1/3 of esophagus on 11/6. Pt recently admitted 10/21-10/24 for Mallory-Weiss tear. Pt with PMH of HCC, recent US shows progress with multifocal hepatoma. PMH includes alcohol abuse, liver cancer with ablation 2015, liver cirrhosis, hep C, HTN, SOB, UC, smoker, multiple EGDs.   Clinical Impression   Pt presents with LE deconditioning, unsteadiness in static standing with decreased BOS, some unsteadiness with ambulation, and increased time/effort to perform mobility tasks. Pt to benefit from acute PT to address deficits. Pt ambulated 250 ft without AD, with mild unsteadiness especially when gait was challenged with head turning and changing directions. PT recommending HHPT to address deficits. PT to progress mobility as tolerated, and will continue to follow acutely.      Follow Up Recommendations Supervision for mobility/OOB;Home health PT    Equipment Recommendations  None recommended by PT    Recommendations for Other Services       Precautions / Restrictions Precautions Precautions: Fall Precaution Comments: Pt with bedrest precautions in orders tab - RN states that this is no longer active, will discontinue it.  Restrictions Weight Bearing Restrictions: No      Mobility  Bed Mobility Overal bed mobility: Needs Assistance Bed Mobility: Supine to Sit;Sit to Supine     Supine to sit: Supervision Sit to supine: Supervision   General bed mobility comments: increased time and effort   Transfers Overall transfer level: Needs assistance Equipment used: None Transfers: Sit to/from Stand Sit to Stand: Supervision         General transfer comment: supervision for safety  Ambulation/Gait Ambulation/Gait  assistance: Min guard Gait Distance (Feet): 250 Feet Assistive device: None Gait Pattern/deviations: Decreased stride length;Step-through pattern;Trunk flexed Gait velocity: decr    General Gait Details: Min guard for safety and steadying if needed.   Stairs            Wheelchair Mobility    Modified Rankin (Stroke Patients Only)       Balance Overall balance assessment: Needs assistance Sitting-balance support: No upper extremity supported;Feet unsupported Sitting balance-Leahy Scale: Good       Standing balance-Leahy Scale: Fair Standing balance comment: cannot accept challenge                High Level Balance Comments: Pt with mild gait deviations with head turning laterally and vertically, and with turning abruptly. 4 stage balance test: feet together 30 seconds, semi tandem 30 seconds, Tandem R/L 17 seconds/10 seconds, Single leg stance R/L 3 seconds/0 seconds             Pertinent Vitals/Pain Pain Assessment: No/denies pain    Home Living Family/patient expects to be discharged to:: Private residence Living Arrangements: Alone Available Help at Discharge: Family;Friend(s);Available PRN/intermittently Type of Home: House Home Access: Stairs to enter Entrance Stairs-Rails: Right Entrance Stairs-Number of Steps: 4 Home Layout: One level Home Equipment: Cane - single point      Prior Function Level of Independence: Independent with assistive device(s)         Comments: uses cane as needed, but does not regularly use AD      Hand Dominance   Dominant Hand: Right    Extremity/Trunk Assessment   Upper Extremity Assessment Upper Extremity Assessment: Overall WFL for tasks assessed  Lower Extremity Assessment Lower Extremity Assessment: Generalized weakness    Cervical / Trunk Assessment Cervical / Trunk Assessment: Normal  Communication   Communication: No difficulties  Cognition Arousal/Alertness: Awake/alert Behavior During  Therapy: WFL for tasks assessed/performed Overall Cognitive Status: Within Functional Limits for tasks assessed                                        General Comments General comments (skin integrity, edema, etc.): pt with enlarged abdomen and was at times lordotic during ambulation secondarily     Exercises     Assessment/Plan    PT Assessment Patient needs continued PT services  PT Problem List Decreased strength;Decreased activity tolerance;Decreased knowledge of use of DME;Decreased balance;Decreased safety awareness;Decreased mobility       PT Treatment Interventions DME instruction;Therapeutic activities;Gait training;Therapeutic exercise;Patient/family education;Stair training;Balance training;Functional mobility training    PT Goals (Current goals can be found in the Care Plan section)  Acute Rehab PT Goals Patient Stated Goal: increase steadiness PT Goal Formulation: With patient Time For Goal Achievement: 09/08/18 Potential to Achieve Goals: Good    Frequency Min 3X/week   Barriers to discharge        Co-evaluation               AM-PAC PT "6 Clicks" Daily Activity  Outcome Measure Difficulty turning over in bed (including adjusting bedclothes, sheets and blankets)?: A Little Difficulty moving from lying on back to sitting on the side of the bed? : A Little Difficulty sitting down on and standing up from a chair with arms (e.g., wheelchair, bedside commode, etc,.)?: A Little Help needed moving to and from a bed to chair (including a wheelchair)?: A Little Help needed walking in hospital room?: A Little Help needed climbing 3-5 steps with a railing? : A Little 6 Click Score: 18    End of Session Equipment Utilized During Treatment: Gait belt Activity Tolerance: Patient tolerated treatment well Patient left: in bed;with call bell/phone within reach;with family/visitor present Nurse Communication: Mobility status PT Visit Diagnosis:  Unsteadiness on feet (R26.81);Muscle weakness (generalized) (M62.81)    Time: 1710-1733 PT Time Calculation (min) (ACUTE ONLY): 23 min   Charges:   PT Evaluation $PT Eval Low Complexity: 1 Low PT Treatments $Gait Training: 8-22 mins       Nicola Police, PT Acute Rehabilitation Services Pager [phone]  Office [phone]  Kimaria Struthers D Despina Hidden 08/25/2018, 6:40 PM

## 2018-08-25 NOTE — Progress Notes (Signed)
PROGRESS NOTE    Peter Nelson  SEG:[id number] DOB: 29-Sep-1951 DOA: 08/22/2018 PCP: Patient, No Pcp Per   Brief Narrative: Peter Nelson is a 67 y.o. male with medical history significant of liver cirrhosis secondary to hepatitis C and alcohol abuse, history of hepatocellular carcinoma status post ablation in March 2015, history of ascites, history of esophageal variceal bleeding in the past with banding, hypertension presenting to the hospital for evaluation of dark stools.  Patient reports having dark stools for the past 2 days in addition to mild generalized abdominal pain.  Denies having any nausea, vomiting, fevers, or chills.  States he does not take any blood thinners.  He continues to drink alcohol on a daily basis-1/2 pint of gin each time.  He last consumed alcohol 2 days ago.  Patient was recently admitted from August 07, 2018 to August 10, 2018 for hematemesis secondary to Mallory-Weiss tear.  5 clips were placed.  ED Course: On arrival, afebrile, tachycardic, and blood pressure stable.  Initial hemoglobin 8.6, repeat 7.4.  ED physician discussed the case with Dr. Fuller Plan from [place], GI will see the patient in the morning.  Patient received IV octreotide 50 mcg bolus followed by infusion and Protonix 80 mg IV in the ED.  TRH paged to admit.   Assessment & Plan:   Principal Problem:   Bleeding esophageal varices (HCC) Active Problems:   Alcohol abuse   Alcoholic cirrhosis of liver (HCC)   Hepatocellular carcinoma (HCC)   Acute GI bleeding   History of hepatitis C   HTN (hypertension)  1-Acute GI bleed, upper. Presents with melena.   on IV protonix, received octreotide for 3 days. .  Patient received one unit PRBC> 11-06 Underwent endoscopy and banding of grade II varices. 11-06 IV ceftriaxone prophylaxis.  Hb at 9.   2-Cirrhosis, HCC.  Recent US with masses, plan for MRI. Follow cytology from paracentesis.  Per GI plan to consult IR>  Holding spironolactone due  to GI bleed.  US paracentesis 11-07. 1.8 L removed.   3-Alcohol abuse;  Counseling provided.  CIWA, ativan.   Acute blood loss anemia;  Secondary to GI bleed.  S/P one unit PRBC>  Hb stable.   HTN; holding diuretic and atenolol.   mild thrombocytopenia; monitor. In setting alcohol use.   RN Pressure Injury Documentation:    Malnutrition Type:      Malnutrition Characteristics:      Nutrition Interventions:     Estimated body mass index is 21.31 kg/m as calculated from the following:   Height as of this encounter: 5' 6"  (1.676 m).   Weight as of this encounter: 59.9 kg.   DVT prophylaxis: scd.  Code Status: full code.  Family Communication: no family at bedside, during my evaluation.  Disposition Plan: remain inpatient   Consultants:   GI   Procedures:   Endoscopy    Antimicrobials:  ceftriaxone   Subjective: Report abdomen less distended. Still with epigastric pain   Objective: Vitals:   08/24/18 1711 08/24/18 2017 08/25/18 0517 08/25/18 1322  BP: 135/71 139/70 133/74 (!) 141/73  Pulse:  74 70 63  Resp:  16 16   Temp:  98.9 F (37.2 C) 99.3 F (37.4 C) 99.3 F (37.4 C)  TempSrc:  Oral Oral Oral  SpO2:  99% 99% 98%  Weight:      Height:        Intake/Output Summary (Last 24 hours) at 08/25/2018 1528 Last data filed at 08/25/2018 1241 Gross per  24 hour  Intake 1729.95 ml  Output -  Net 1729.95 ml   Filed Weights   08/22/18 1355  Weight: 59.9 kg    Examination:  General exam: NAD Respiratory system: CTA Cardiovascular system: S 1, S 2, RRR Gastrointestinal system: BS present, distended  Central nervous system: Alert.  Extremities: no edema  Data Reviewed: I have personally reviewed following labs and imaging studies  CBC: Recent Labs  Lab 08/22/18 2041 08/23/18 0022 08/23/18 0507 08/23/18 0850 08/24/18 0618 08/25/18 1020  WBC 6.8 7.0 5.3 5.3 6.4 6.3  NEUTROABS 3.6  --   --   --   --   --   HGB 7.4* 8.0* 7.0*  7.5* 10.1* 9.5*  HCT 23.3* 25.7* 22.2* 23.6* 32.0* 29.7*  MCV 104.0* 105.3* 105.2* 104.9* 104.2* 105.3*  PLT 152 158 124* 135* 143* 111*   Basic Metabolic Panel: Recent Labs  Lab 08/22/18 1414 08/24/18 0618 08/25/18 1020  NA 139 139 135  K 3.9 4.0 3.9  CL 110 109 107  CO2 22 23 23   GLUCOSE 91 101* 133*  BUN 12 18 14   CREATININE 0.93 1.11 1.15  CALCIUM 8.1* 8.3* 7.8*   GFR: Estimated Creatinine Clearance: 52.8 mL/min (by C-G formula based on SCr of 1.15 mg/dL). Liver Function Tests: Recent Labs  Lab 08/22/18 1414 08/23/18 0507 08/24/18 0618  AST 106* 84* 95*  ALT 43 35 40  ALKPHOS 122 98 118  BILITOT 2.2* 2.6* 2.9*  PROT 7.4 6.5 7.4  ALBUMIN 2.4* 2.1* 2.5*   No results for input(s): LIPASE, AMYLASE in the last 168 hours. No results for input(s): AMMONIA in the last 168 hours. Coagulation Profile: Recent Labs  Lab 08/23/18 1555 08/24/18 0618  INR 1.43 1.42   Cardiac Enzymes: No results for input(s): CKTOTAL, CKMB, CKMBINDEX, TROPONINI in the last 168 hours. BNP (last 3 results) No results for input(s): PROBNP in the last 8760 hours. HbA1C: No results for input(s): HGBA1C in the last 72 hours. CBG: No results for input(s): GLUCAP in the last 168 hours. Lipid Profile: No results for input(s): CHOL, HDL, LDLCALC, TRIG, CHOLHDL, LDLDIRECT in the last 72 hours. Thyroid Function Tests: No results for input(s): TSH, T4TOTAL, FREET4, T3FREE, THYROIDAB in the last 72 hours. Anemia Panel: No results for input(s): VITAMINB12, FOLATE, FERRITIN, TIBC, IRON, RETICCTPCT in the last 72 hours. Sepsis Labs: No results for input(s): PROCALCITON, LATICACIDVEN in the last 168 hours.  Recent Results (from the past 240 hour(s))  Culture, body fluid-bottle     Status: None (Preliminary result)   Collection Time: 08/24/18  4:49 PM  Result Value Ref Range Status   Specimen Description FLUID PERITONEAL  Final   Special Requests BOTTLES DRAWN AEROBIC AND ANAEROBIC  Final    Culture   Final    NO GROWTH < 24 HOURS Performed at [facility], [place] [address] [place], [place] [address]    Report Status PENDING  Incomplete  Gram stain     Status: None   Collection Time: 08/24/18  4:49 PM  Result Value Ref Range Status   Specimen Description FLUID PERITONEAL  Final   Special Requests NONE  Final   Gram Stain   Final    WBC PRESENT, PREDOMINANTLY MONONUCLEAR NO ORGANISMS SEEN CYTOSPIN SMEAR Performed at [facility], [place] [address] [place], [place] [address]    Report Status 08/24/2018 FINAL  Final         Radiology Studies: US Paracentesis  Result Date: 08/24/2018 INDICATION:  Patient with history of [place], esophageal varices s/p banding, liver cirrhosis secondary to ETOH abuse and hepatitis C with recurrent ascites. Request for diagnostic and therapeutic paracentesis today. EXAM: ULTRASOUND GUIDED DIAGNOSTIC AND THERAPEUTIC PARACENTESIS MEDICATIONS: 10 mL 1% lidocaine. COMPLICATIONS: None immediate. PROCEDURE: Informed written consent was obtained from the patient after a discussion of the risks, benefits and alternatives to treatment. A timeout was performed prior to the initiation of the procedure. Initial ultrasound scanning demonstrates a moderate amount of ascites within the right lower abdominal quadrant. The right lower abdomen was prepped and draped in the usual sterile fashion. 1% lidocaine was used for local anesthesia. Following this, a 19 gauge, 7-cm, Yueh catheter was introduced. An ultrasound image was saved for documentation purposes. The paracentesis was performed. The catheter was removed and a dressing was applied. The patient tolerated the procedure well without immediate post procedural complication. FINDINGS: A total of approximately 1.8 L of clear yellow fluid was removed. Samples were sent to the laboratory as requested by the clinical team. IMPRESSION: Successful ultrasound-guided paracentesis yielding 1.8 liters of peritoneal  fluid. Read by Candiss Norse, PA-C Electronically Signed   By: Aletta Edouard M.D.   On: 08/24/2018 17:18        Scheduled Meds: . folic acid  1 mg Oral Daily  . multivitamin with minerals  1 tablet Oral Daily  . nadolol  20 mg Oral Daily  . pantoprazole  40 mg Oral Q0600  . polyethylene glycol  17 g Oral Daily  . thiamine  100 mg Oral Daily   Or  . thiamine  100 mg Intravenous Daily   Continuous Infusions: . cefTRIAXone (ROCEPHIN)  IV 1 g (08/24/18 2126)     LOS: 2 days    Time spent: 35 minutes,    Elmarie Shiley, MD [facility] Pager [phone]  If 7PM-7AM, please contact night-coverage [email or web address] Password [facility] 08/25/2018, 3:28 PM

## 2018-08-26 ENCOUNTER — Inpatient Hospital Stay (HOSPITAL_COMMUNITY): Payer: Medicare HMO

## 2018-08-26 LAB — CBC
HEMATOCRIT: 30 % — AB (ref 39.0–52.0)
HEMOGLOBIN: 9.4 g/dL — AB (ref 13.0–17.0)
MCH: 32.3 pg (ref 26.0–34.0)
MCHC: 31.3 g/dL (ref 30.0–36.0)
MCV: 103.1 fL — AB (ref 80.0–100.0)
Platelets: 143 10*3/uL — ABNORMAL LOW (ref 150–400)
RBC: 2.91 MIL/uL — ABNORMAL LOW (ref 4.22–5.81)
RDW: 15.6 % — ABNORMAL HIGH (ref 11.5–15.5)
WBC: 6 10*3/uL (ref 4.0–10.5)
nRBC: 0 % (ref 0.0–0.2)

## 2018-08-26 LAB — TYPE AND SCREEN
ABO/RH(D): O NEG
Antibody Screen: NEGATIVE
Unit division: 0
Unit division: 0

## 2018-08-26 LAB — BASIC METABOLIC PANEL
Anion gap: 5 (ref 5–15)
BUN: 14 mg/dL (ref 8–23)
CHLORIDE: 108 mmol/L (ref 98–111)
CO2: 22 mmol/L (ref 22–32)
CREATININE: 1.13 mg/dL (ref 0.61–1.24)
Calcium: 7.8 mg/dL — ABNORMAL LOW (ref 8.9–10.3)
GFR calc non Af Amer: >60 mL/min (ref >60)
GLUCOSE: 103 mg/dL — AB (ref 70–99)
Potassium: 3.7 mmol/L (ref 3.5–5.1)
Sodium: 135 mmol/L (ref 135–145)

## 2018-08-26 LAB — BPAM RBC
BLOOD PRODUCT EXPIRATION DATE: 201912042359
Blood Product Expiration Date: 201912032359
ISSUE DATE / TIME: 201911061021
Unit Type and Rh: 9500
Unit Type and Rh: 9500

## 2018-08-26 MED ORDER — GADOBUTROL 1 MMOL/ML IV SOLN
6.0000 mL | Freq: Once | INTRAVENOUS | Status: AC | PRN
  Administered 2018-08-26: 6 mL via INTRAVENOUS

## 2018-08-26 MED ORDER — SPIRONOLACTONE 50 MG PO TABS: 50.0000 mg | ORAL_TABLET | Freq: Every day | ORAL | 1 refills | Status: DC

## 2018-08-26 MED ORDER — POLYETHYLENE GLYCOL 3350 17 G PO PACK: 17.0000 g | PACK | Freq: Every day | ORAL | 0 refills | Status: DC

## 2018-08-26 MED ORDER — OXYCODONE HCL 5 MG PO TABS: 5.0000 mg | ORAL_TABLET | Freq: Four times a day (QID) | ORAL | 0 refills | Status: DC | PRN

## 2018-08-26 MED ORDER — PANTOPRAZOLE SODIUM 40 MG PO TBEC: 40.0000 mg | DELAYED_RELEASE_TABLET | Freq: Every day | ORAL | 0 refills | Status: DC

## 2018-08-26 MED ORDER — NADOLOL 20 MG PO TABS: 20.0000 mg | ORAL_TABLET | Freq: Every day | ORAL | 0 refills | Status: DC

## 2018-08-26 NOTE — Care Management Note (Addendum)
Case Management Note  Patient Details  Name: Peter Nelson MRN: [id number] Date of Birth: [birth year]  Subjective/Objective:  Anemia, cirrhosis                  Action/Plan: NCM spoke to pt and ex wife at bedside. Offered choice for HH/list provided. Requested [facility] for [facility]. Contacted [facility] with new referral. Contacted [facility] for RW to be delivered to room prior to dc.   Ex wife Burman Riis, address [address] [place] [address] # [phone]  Expected Discharge Date:  08/26/18               Expected Discharge Plan:  [place]  In-House Referral:  NA  Discharge planning Services  CM Consult  Post Acute Care Choice:  Home Health Choice offered to:  Spouse  DME Arranged:  [facility] Rile rolling DME Agency:  [place] Arranged:  RN, PT [facility] Agency:  [place]  Status of Service:  Completed, signed off  If discussed at [place] of Stay Meetings, dates discussed:    Additional Comments:  Erenest Rasher, RN 08/26/2018, 2:05 PM

## 2018-08-26 NOTE — Discharge Summary (Signed)
Physician Discharge Summary  Peter Nelson TTS:[id number] DOB: 08/09/1951 DOA: 08/22/2018  PCP: Patient, No Pcp Per  Admit date: 08/22/2018 Discharge date: 08/26/2018  Admitted From: Home  Disposition:  Home   Recommendations for Outpatient Follow-up:  1. Follow up with PCP in 1-2 weeks 2. Please obtain BMP/CBC in one week 3. Follow up with Dr Ammie Dalton for further care [facility] 4. Follow up with GI, for liver failure care.   Home Health: yes.   Discharge Condition: Stable.  CODE STATUS: full code.  Diet recommendation: Heart Healthy   Brief/Interim Summary: Brief Narrative: Peter Roskos Pennixis a 67 y.o.malewith medical history significant ofliver cirrhosis secondary to hepatitis C and alcohol abuse, history of hepatocellular carcinoma status post ablation in March 2015, history of ascites, history of esophageal variceal bleeding in the past with banding, hypertensionpresenting to the hospital for evaluation of dark stools. Patient reports having dark stools for the past 2 days in addition to mild generalized abdominal pain. Denies having any nausea, vomiting, fevers, or chills. States he does not take any blood thinners. He continues to drink alcohol on a daily basis-1/2 pint of gin each time. He last consumedalcohol 2 days ago.  Patient was recently admitted from August 07, 2018 to August 10, 2018 for hematemesis secondary to Mallory-Weiss tear.5 clips were placed.  ED Course:On arrival, afebrile, tachycardic,andblood pressure stable. Initial hemoglobin 8.6, repeat 7.4. ED physician discussed the case with Dr. Fuller Plan from [place] [facility] see the patient in the morning. Patient received IV octreotide 50 mcg bolus followed by infusion and Protonix 80 mg IV in the ED. TRH paged to admit.   Assessment & Plan:   Principal Problem:   Bleeding esophageal varices (HCC) Active Problems:   Alcohol abuse   Alcoholic cirrhosis of liver (HCC)   Hepatocellular carcinoma (HCC)   Acute GI bleeding   History of hepatitis C   HTN (hypertension)  1-Acute GI bleed, upper. Presents with melena.   on IV protonix, received octreotide for 3 days. .  Patient received one unit PRBC> 11-06 Underwent endoscopy and banding of grade II varices. 11-06 IV ceftriaxone prophylaxis.  Hb at 9.  Stable for discharge.  He relates ribs, sternum pain. Pain on palpation. Pain management    2-Cirrhosis, HCC.  Recent US with masses, plan for MRI. Follow cytology from paracentesis.  Per GI plan to consult IR>  Holding spironolactone due to GI bleed.  US paracentesis 11-07. 1.8 L removed. Cytology pending.  Follow up with DR Sherrill 11-18 for further care of HCC>  Resume lower dose spironolactone.  Resume lasix out patient  3-Alcohol abuse;  Counseling provided.  CIWA, ativan.   Acute blood loss anemia;  Secondary to GI bleed.  S/P one unit PRBC>  Hb stable.   HTN; on Atenolol.   mild thrombocytopenia; monitor. In setting alcohol use.      Discharge Diagnoses:  Principal Problem:   Bleeding esophageal varices (HCC) Active Problems:   Alcohol abuse   Alcoholic cirrhosis of liver (HCC)   Hepatocellular carcinoma (HCC)   Acute GI bleeding   History of hepatitis C   HTN (hypertension)    Discharge Instructions  Discharge Instructions    Diet - low sodium heart healthy   Complete by:  As directed    Increase activity slowly   Complete by:  As directed      Allergies as of 08/26/2018      Reactions   Aspirin Other (See Comments)   Does not take because of  hep c      Medication List    STOP taking these medications   diazepam 2 MG tablet Commonly known as:  VALIUM   famotidine 20 MG tablet Commonly known as:  PEPCID   furosemide 40 MG tablet Commonly known as:  LASIX   methocarbamol 750 MG tablet Commonly known as:  ROBAXIN   potassium chloride SA 20 MEQ tablet Commonly known as:  K-DUR,KLOR-CON   sucralfate 1 GM/10ML suspension Commonly  known as:  CARAFATE   tamsulosin 0.4 MG Caps capsule Commonly known as:  FLOMAX   VITAMIN E/FOLIC DXIP/J-8/S-50 PO     TAKE these medications   multivitamin with minerals Tabs tablet Take 1 tablet by mouth daily.   nadolol 20 MG tablet Commonly known as:  CORGARD Take 1 tablet (20 mg total) by mouth daily. Start taking on:  08/27/2018 What changed:    medication strength  how much to take  when to take this   oxyCODONE 5 MG immediate release tablet Commonly known as:  Oxy IR/ROXICODONE Take 1 tablet (5 mg total) by mouth every 6 (six) hours as needed for moderate pain.   pantoprazole 40 MG tablet Commonly known as:  PROTONIX Take 1 tablet (40 mg total) by mouth daily at 6 (six) AM. Start taking on:  08/27/2018 What changed:  when to take this   polyethylene glycol packet Commonly known as:  MIRALAX / GLYCOLAX Take 17 g by mouth daily. Start taking on:  08/27/2018   spironolactone 50 MG tablet Commonly known as:  ALDACTONE Take 1 tablet (50 mg total) by mouth daily. What changed:  when to take this   VITAMIN C PO Take 1 tablet by mouth daily.   VITAMIN D PO Take 1 tablet by mouth daily.   VITAMIN E PO Take 1 tablet by mouth daily.      Follow-up Information    Ladell Pier, MD Follow up in 1 week(s).   Specialty:  Oncology Why:  Appointment on 08/04/2018 Contact information: [place] [address] [phone]        Yetta Flock, MD Follow up in 2 week(s).   Specialty:  Gastroenterology Contact information: [address] [place] [address] [phone]          Allergies  Allergen Reactions  . Aspirin Other (See Comments)    Does not take because of hep c    Consultations: GI   Procedures/Studies: US Paracentesis  Result Date: 08/24/2018 INDICATION: Patient with history of [place], esophageal varices s/p banding, liver cirrhosis secondary to ETOH abuse and hepatitis C with recurrent  ascites. Request for diagnostic and therapeutic paracentesis today. EXAM: ULTRASOUND GUIDED DIAGNOSTIC AND THERAPEUTIC PARACENTESIS MEDICATIONS: 10 mL 1% lidocaine. COMPLICATIONS: None immediate. PROCEDURE: Informed written consent was obtained from the patient after a discussion of the risks, benefits and alternatives to treatment. A timeout was performed prior to the initiation of the procedure. Initial ultrasound scanning demonstrates a moderate amount of ascites within the right lower abdominal quadrant. The right lower abdomen was prepped and draped in the usual sterile fashion. 1% lidocaine was used for local anesthesia. Following this, a 19 gauge, 7-cm, Yueh catheter was introduced. An ultrasound image was saved for documentation purposes. The paracentesis was performed. The catheter was removed and a dressing was applied. The patient tolerated the procedure well without immediate post procedural complication. FINDINGS: A total of approximately 1.8 L of clear yellow fluid was removed. Samples were sent to  the laboratory as requested by the clinical team. IMPRESSION: Successful ultrasound-guided paracentesis yielding 1.8 liters of peritoneal fluid. Read by Candiss Norse, PA-C Electronically Signed   By: Aletta Edouard M.D.   On: 08/24/2018 17:18   Dg Chest Port 1 View  Result Date: 08/07/2018 CLINICAL DATA:  Chest pain EXAM: PORTABLE CHEST 1 VIEW COMPARISON:  04/21/2017 FINDINGS: Heart is borderline in size. No confluent airspace opacities or effusions. No acute bony abnormality. IMPRESSION: Borderline cardiomegaly.  No active disease. Electronically Signed   By: Rolm Baptise M.D.   On: 08/07/2018 21:42   US Liver Doppler  Result Date: 08/09/2018 CLINICAL DATA:  Cirrhosis, hep C, history hepatocellular carcinoma, status post previous microwave ablations EXAM: DUPLEX ULTRASOUND OF LIVER TECHNIQUE: Color and duplex Doppler ultrasound was performed to evaluate the hepatic in-flow and out-flow  vessels. COMPARISON:  02/28/2017 FINDINGS: Portal Vein Velocities Main:  13 cm/sec Right:  10 cm/sec Left:  14 cm/sec Hepatic Vein Velocities Right:  86 cm/sec Middle:  20 cm/sec Left:  28 cm/sec Hepatic Artery Velocity:  98 cm/sec Splenic Vein Velocity:  20 cm/sec Varices: Not visualized Ascites: Small amount of perihepatic ascites Patent portal, hepatic and splenic veins with normal directional flow. Negative for portal vein thrombus or occlusion. Heterogeneous liver with nodularity of the surface compatible with known cirrhosis. Solid hypoechoic hepatic lesions bilaterally, 1 measuring 1.5 cm in the left hepatic lobe lateral segment and a second the right inferior lesion slightly exophytic measuring 4.3 cm. This is concerning for progression of multifocal hepatocellular carcinoma versus metastatic disease. IMPRESSION: Patent portal, hepatic and splenic veins. Hepatic cirrhosis with bilateral hypoechoic solid hepatic lesions concerning for progression of multifocal hepatocellular carcinoma versus metastatic disease. Perihepatic ascites Electronically Signed   By: Jerilynn Mages.  Shick M.D.   On: 08/09/2018 18:02      Subjective: Still with abdominal pain.  Pain in his chest, ribs.  Tolerating diet   Discharge Exam: Vitals:   08/25/18 2055 08/26/18 0437  BP: (!) 147/76 113/67  Pulse: (!) 59 (!) 58  Resp: 20 18  Temp: 99.2 F (37.3 C) 98.3 F (36.8 C)  SpO2: 98% 96%   Vitals:   08/25/18 0517 08/25/18 1322 08/25/18 2055 08/26/18 0437  BP: 133/74 (!) 141/73 (!) 147/76 113/67  Pulse: 70 63 (!) 59 (!) 58  Resp: 16 16 20 18   Temp: 99.3 F (37.4 C) 99.3 F (37.4 C) 99.2 F (37.3 C) 98.3 F (36.8 C)  TempSrc: Oral Oral Oral Oral  SpO2: 99% 98% 98% 96%  Weight:      Height:        General: Pt is alert, awake, not in acute distress Cardiovascular: RRR, S1/S2 +, no rubs, no gallops Respiratory: CTA bilaterally, no wheezing, no rhonchi Abdominal: Soft, NT, ND, bowel sounds + Extremities: no edema,  no cyanosis    The results of significant diagnostics from this hospitalization (including imaging, microbiology, ancillary and laboratory) are listed below for reference.     Microbiology: Recent Results (from the past 240 hour(s))  Culture, body fluid-bottle     Status: None (Preliminary result)   Collection Time: 08/24/18  4:49 PM  Result Value Ref Range Status   Specimen Description FLUID PERITONEAL  Final   Special Requests BOTTLES DRAWN AEROBIC AND ANAEROBIC  Final   Culture   Final    NO GROWTH < 24 HOURS Performed at [facility], [place] [address], [place], [place] [address]    Report Status PENDING  Incomplete  Gram stain  Status: None   Collection Time: 08/24/18  4:49 PM  Result Value Ref Range Status   Specimen Description FLUID PERITONEAL  Final   Special Requests NONE  Final   Gram Stain   Final    WBC PRESENT, PREDOMINANTLY MONONUCLEAR NO ORGANISMS SEEN CYTOSPIN SMEAR Performed at [facility], [address] [place], [place] [address]    Report Status 08/24/2018 FINAL  Final     Labs: BNP (last 3 results) No results for input(s): BNP in the last 8760 hours. Basic Metabolic Panel: Recent Labs  Lab 08/22/18 1414 08/24/18 0618 08/25/18 1020 08/26/18 0452  NA 139 139 135 135  K 3.9 4.0 3.9 3.7  CL 110 109 107 108  CO2 22 23 23 22   GLUCOSE 91 101* 133* 103*  BUN 12 18 14 14   CREATININE 0.93 1.11 1.15 1.13  CALCIUM 8.1* 8.3* 7.8* 7.8*   Liver Function Tests: Recent Labs  Lab 08/22/18 1414 08/23/18 0507 08/24/18 0618  AST 106* 84* 95*  ALT 43 35 40  ALKPHOS 122 98 118  BILITOT 2.2* 2.6* 2.9*  PROT 7.4 6.5 7.4  ALBUMIN 2.4* 2.1* 2.5*   No results for input(s): LIPASE, AMYLASE in the last 168 hours. No results for input(s): AMMONIA in the last 168 hours. CBC: Recent Labs  Lab 08/22/18 2041  08/23/18 0507 08/23/18 0850 08/24/18 0618 08/25/18 1020 08/26/18 0452  WBC 6.8   < > 5.3 5.3 6.4 6.3 6.0  NEUTROABS 3.6  --   --    --   --   --   --   HGB 7.4*   < > 7.0* 7.5* 10.1* 9.5* 9.4*  HCT 23.3*   < > 22.2* 23.6* 32.0* 29.7* 30.0*  MCV 104.0*   < > 105.2* 104.9* 104.2* 105.3* 103.1*  PLT 152   < > 124* 135* 143* 121* 143*   < > = values in this interval not displayed.   Cardiac Enzymes: No results for input(s): CKTOTAL, CKMB, CKMBINDEX, TROPONINI in the last 168 hours. BNP: Invalid input(s): POCBNP CBG: No results for input(s): GLUCAP in the last 168 hours. D-Dimer No results for input(s): DDIMER in the last 72 hours. Hgb A1c No results for input(s): HGBA1C in the last 72 hours. Lipid Profile No results for input(s): CHOL, HDL, LDLCALC, TRIG, CHOLHDL, LDLDIRECT in the last 72 hours. Thyroid function studies No results for input(s): TSH, T4TOTAL, T3FREE, THYROIDAB in the last 72 hours.  Invalid input(s): FREET3 Anemia work up No results for input(s): VITAMINB12, FOLATE, FERRITIN, TIBC, IRON, RETICCTPCT in the last 72 hours. Urinalysis    Component Value Date/Time   COLORURINE YELLOW 04/20/2017 2340   APPEARANCEUR CLEAR 04/20/2017 2340   LABSPEC 1.020 05/04/2017 0914   PHURINE 7.0 05/04/2017 0914   GLUCOSEU NEGATIVE 05/04/2017 0914   HGBUR MODERATE (A) 05/04/2017 0914   BILIRUBINUR SMALL (A) 05/04/2017 0914   KETONESUR NEGATIVE 05/04/2017 0914   PROTEINUR 100 (A) 05/04/2017 0914   UROBILINOGEN >=8.0 05/04/2017 0914   NITRITE POSITIVE (A) 05/04/2017 0914   LEUKOCYTESUR SMALL (A) 05/04/2017 0914   Sepsis Labs Invalid input(s): PROCALCITONIN,  WBC,  LACTICIDVEN Microbiology Recent Results (from the past 240 hour(s))  Culture, body fluid-bottle     Status: None (Preliminary result)   Collection Time: 08/24/18  4:49 PM  Result Value Ref Range Status   Specimen Description FLUID PERITONEAL  Final   Special Requests BOTTLES DRAWN AEROBIC AND ANAEROBIC  Final   Culture   Final  NO GROWTH < 24 HOURS Performed at [facility] [address], [place], [place] [address]    Report Status  PENDING  Incomplete  Gram stain     Status: None   Collection Time: 08/24/18  4:49 PM  Result Value Ref Range Status   Specimen Description FLUID PERITONEAL  Final   Special Requests NONE  Final   Gram Stain   Final    WBC PRESENT, PREDOMINANTLY MONONUCLEAR NO ORGANISMS SEEN CYTOSPIN SMEAR Performed at [facility], [place] [address], [place], [place] [address]    Report Status 08/24/2018 FINAL  Final     Time coordinating discharge: 35 minutes.   SIGNED:   Elmarie Shiley, MD  [facility] 08/26/2018, 10:48 AM Pager [phone]  If 7PM-7AM, please contact night-coverage [email or web address] Password [email or web address]

## 2018-08-26 NOTE — Care Management Important Message (Signed)
Important Message  Patient Details  Name: Peter Nelson MRN: [id number] Date of Birth: 1951/09/13   Medicare Important Message Given:  Yes    Erenest Rasher, RN 08/26/2018, 2:10 PM

## 2018-08-29 ENCOUNTER — Inpatient Hospital Stay: Payer: Medicare HMO | Attending: Oncology | Admitting: Oncology

## 2018-08-29 ENCOUNTER — Ambulatory Visit: Payer: Medicare HMO | Admitting: Oncology

## 2018-08-29 ENCOUNTER — Telehealth: Payer: Self-pay | Admitting: Oncology

## 2018-08-29 VITALS — BP 151/72 | HR 66 | Temp 98.3°F | Resp 18 | Ht 66.0 in | Wt 139.8 lb

## 2018-08-29 DIAGNOSIS — C220 Liver cell carcinoma: Secondary | ICD-10-CM

## 2018-08-29 LAB — CULTURE, BODY FLUID-BOTTLE: CULTURE: NO GROWTH

## 2018-08-29 NOTE — Telephone Encounter (Signed)
Scheduled appt per 11/12 los- gave patient AVS and calender per los.   
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
## 2018-08-30 ENCOUNTER — Encounter: Payer: Self-pay | Admitting: *Deleted

## 2018-08-30 DIAGNOSIS — K7031 Alcoholic cirrhosis of liver with ascites: Secondary | ICD-10-CM | POA: Diagnosis not present

## 2018-08-30 DIAGNOSIS — D62 Acute posthemorrhagic anemia: Secondary | ICD-10-CM | POA: Diagnosis not present

## 2018-08-30 DIAGNOSIS — B182 Chronic viral hepatitis C: Secondary | ICD-10-CM | POA: Diagnosis not present

## 2018-08-30 DIAGNOSIS — I8511 Secondary esophageal varices with bleeding: Secondary | ICD-10-CM | POA: Diagnosis not present

## 2018-08-30 DIAGNOSIS — F10288 Alcohol dependence with other alcohol-induced disorder: Secondary | ICD-10-CM | POA: Diagnosis not present

## 2018-08-30 DIAGNOSIS — C220 Liver cell carcinoma: Secondary | ICD-10-CM | POA: Diagnosis not present

## 2018-08-31 DIAGNOSIS — C220 Liver cell carcinoma: Secondary | ICD-10-CM | POA: Diagnosis not present

## 2018-08-31 DIAGNOSIS — I8511 Secondary esophageal varices with bleeding: Secondary | ICD-10-CM | POA: Diagnosis not present

## 2018-08-31 DIAGNOSIS — D62 Acute posthemorrhagic anemia: Secondary | ICD-10-CM | POA: Diagnosis not present

## 2018-08-31 DIAGNOSIS — B182 Chronic viral hepatitis C: Secondary | ICD-10-CM | POA: Diagnosis not present

## 2018-08-31 DIAGNOSIS — F10288 Alcohol dependence with other alcohol-induced disorder: Secondary | ICD-10-CM | POA: Diagnosis not present

## 2018-08-31 DIAGNOSIS — K7031 Alcoholic cirrhosis of liver with ascites: Secondary | ICD-10-CM | POA: Diagnosis not present

## 2018-08-31 NOTE — Progress Notes (Signed)
  Oncology Nurse Navigator Documentation  Surgical referral sent to [facility] - Dr. Barry Dienes.

## 2018-09-01 DIAGNOSIS — D62 Acute posthemorrhagic anemia: Secondary | ICD-10-CM | POA: Diagnosis not present

## 2018-09-01 DIAGNOSIS — C220 Liver cell carcinoma: Secondary | ICD-10-CM | POA: Diagnosis not present

## 2018-09-01 DIAGNOSIS — K7031 Alcoholic cirrhosis of liver with ascites: Secondary | ICD-10-CM | POA: Diagnosis not present

## 2018-09-01 DIAGNOSIS — F10288 Alcohol dependence with other alcohol-induced disorder: Secondary | ICD-10-CM | POA: Diagnosis not present

## 2018-09-01 DIAGNOSIS — B182 Chronic viral hepatitis C: Secondary | ICD-10-CM | POA: Diagnosis not present

## 2018-09-01 DIAGNOSIS — I8511 Secondary esophageal varices with bleeding: Secondary | ICD-10-CM | POA: Diagnosis not present

## 2018-09-01 NOTE — Progress Notes (Signed)
  Oncology Nurse Navigator Documentation  Surgical consult with Dr. Barry Dienes on 09/04/18 at 9:30. )

## 2018-09-02 ENCOUNTER — Emergency Department (HOSPITAL_COMMUNITY)
Admission: EM | Admit: 2018-09-02 | Discharge: 2018-09-02 | Disposition: A | Discharge status: Home / Self Care | Payer: Medicare HMO | Attending: Emergency Medicine | Admitting: Emergency Medicine

## 2018-09-02 ENCOUNTER — Encounter (HOSPITAL_COMMUNITY): Payer: Self-pay

## 2018-09-02 ENCOUNTER — Other Ambulatory Visit: Payer: Self-pay

## 2018-09-02 DIAGNOSIS — B1710 Acute hepatitis C without hepatic coma: Secondary | ICD-10-CM | POA: Diagnosis not present

## 2018-09-02 DIAGNOSIS — F17210 Nicotine dependence, cigarettes, uncomplicated: Secondary | ICD-10-CM | POA: Diagnosis not present

## 2018-09-02 DIAGNOSIS — Z79899 Other long term (current) drug therapy: Secondary | ICD-10-CM | POA: Diagnosis not present

## 2018-09-02 DIAGNOSIS — K920 Hematemesis: Secondary | ICD-10-CM

## 2018-09-02 DIAGNOSIS — I10 Essential (primary) hypertension: Secondary | ICD-10-CM | POA: Insufficient documentation

## 2018-09-02 DIAGNOSIS — R1111 Vomiting without nausea: Secondary | ICD-10-CM | POA: Diagnosis not present

## 2018-09-02 DIAGNOSIS — R110 Nausea: Secondary | ICD-10-CM | POA: Diagnosis not present

## 2018-09-02 DIAGNOSIS — Z8505 Personal history of malignant neoplasm of liver: Secondary | ICD-10-CM | POA: Insufficient documentation

## 2018-09-02 LAB — CBC WITH DIFFERENTIAL/PLATELET
Abs Immature Granulocytes: 0.03 10*3/uL (ref 0.00–0.07)
BASOS ABS: 0.1 10*3/uL (ref 0.0–0.1)
Basophils Relative: 1 %
EOS ABS: 0.2 10*3/uL (ref 0.0–0.5)
Eosinophils Relative: 2 %
HCT: 25.9 % — ABNORMAL LOW (ref 39.0–52.0)
Hemoglobin: 8.1 g/dL — ABNORMAL LOW (ref 13.0–17.0)
IMMATURE GRANULOCYTES: 0 %
Lymphocytes Relative: 25 %
Lymphs Abs: 1.8 10*3/uL (ref 0.7–4.0)
MCH: 32 pg (ref 26.0–34.0)
MCHC: 31.3 g/dL (ref 30.0–36.0)
MCV: 102.4 fL — ABNORMAL HIGH (ref 80.0–100.0)
Monocytes Absolute: 0.9 10*3/uL (ref 0.1–1.0)
Monocytes Relative: 12 %
NEUTROS PCT: 60 %
NRBC: 0 % (ref 0.0–0.2)
Neutro Abs: 4.5 10*3/uL (ref 1.7–7.7)
Platelets: 136 10*3/uL — ABNORMAL LOW (ref 150–400)
RBC: 2.53 MIL/uL — ABNORMAL LOW (ref 4.22–5.81)
RDW: 14.8 % (ref 11.5–15.5)
WBC: 7.4 10*3/uL (ref 4.0–10.5)

## 2018-09-02 LAB — COMPREHENSIVE METABOLIC PANEL
ALT: 28 U/L (ref 0–44)
AST: 66 U/L — AB (ref 15–41)
Albumin: 2 g/dL — ABNORMAL LOW (ref 3.5–5.0)
Alkaline Phosphatase: 101 U/L (ref 38–126)
Anion gap: 4 — ABNORMAL LOW (ref 5–15)
BUN: 25 mg/dL — AB (ref 8–23)
CHLORIDE: 108 mmol/L (ref 98–111)
CO2: 25 mmol/L (ref 22–32)
Calcium: 7.8 mg/dL — ABNORMAL LOW (ref 8.9–10.3)
Creatinine, Ser: 0.92 mg/dL (ref 0.61–1.24)
Glucose, Bld: 103 mg/dL — ABNORMAL HIGH (ref 70–99)
POTASSIUM: 4.8 mmol/L (ref 3.5–5.1)
SODIUM: 137 mmol/L (ref 135–145)
Total Bilirubin: 2 mg/dL — ABNORMAL HIGH (ref 0.3–1.2)
Total Protein: 6.2 g/dL — ABNORMAL LOW (ref 6.5–8.1)

## 2018-09-02 LAB — LIPASE, BLOOD: LIPASE: 31 U/L (ref 11–51)

## 2018-09-02 LAB — CBG MONITORING, ED: GLUCOSE-CAPILLARY: 88 mg/dL (ref 70–99)

## 2018-09-02 LAB — ETHANOL

## 2018-09-02 MED ORDER — METOCLOPRAMIDE HCL 10 MG PO TABS: 10.0000 mg | ORAL_TABLET | Freq: Three times a day (TID) | ORAL | 0 refills | Status: DC

## 2018-09-02 NOTE — ED Notes (Signed)
He remains comfortable, and as I write this, he is napping.

## 2018-09-02 NOTE — ED Notes (Signed)
Bed: RF16 Expected date: 09/02/18 Expected time:  Means of arrival:  Comments: EMS
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
## 2018-09-02 NOTE — ED Triage Notes (Signed)
He states he vomited a quantity of red blood (x 1) this morning. He tells me he feels "alright". He also tells me he has cancer and cirrhosis; and has had a similar episode of hematemesis a couple of times recently. He is alert and oriented x 4 with clear speech.

## 2018-09-02 NOTE — Discharge Instructions (Signed)
As discussed, your evaluation today has been largely reassuring.  But, it is important that you monitor your condition carefully, and do not hesitate to return to the ED if you develop new, or concerning changes in your condition. ? ?Otherwise, please follow-up with your physician for appropriate ongoing care. ? ?

## 2018-09-04 ENCOUNTER — Telehealth: Payer: Self-pay | Admitting: *Deleted

## 2018-09-04 DIAGNOSIS — I8511 Secondary esophageal varices with bleeding: Secondary | ICD-10-CM | POA: Diagnosis not present

## 2018-09-04 DIAGNOSIS — F10288 Alcohol dependence with other alcohol-induced disorder: Secondary | ICD-10-CM | POA: Diagnosis not present

## 2018-09-04 DIAGNOSIS — B182 Chronic viral hepatitis C: Secondary | ICD-10-CM | POA: Diagnosis not present

## 2018-09-04 DIAGNOSIS — D62 Acute posthemorrhagic anemia: Secondary | ICD-10-CM | POA: Diagnosis not present

## 2018-09-04 DIAGNOSIS — C220 Liver cell carcinoma: Secondary | ICD-10-CM | POA: Diagnosis not present

## 2018-09-04 DIAGNOSIS — K7031 Alcoholic cirrhosis of liver with ascites: Secondary | ICD-10-CM | POA: Diagnosis not present

## 2018-09-04 NOTE — Progress Notes (Signed)
  Oncology Nurse Navigator Documentation     Patient did not show up for surgical appointment with Dr. Barry Dienes today. Will reach out to see what barriers may be. )

## 2018-09-04 NOTE — Telephone Encounter (Signed)
Called to ask if Dr. Benay Spice is willing to sign orders for nursing,PT,OT for this patient. States the MD on file will not sign them.

## 2018-09-05 ENCOUNTER — Encounter (HOSPITAL_COMMUNITY): Payer: Self-pay

## 2018-09-05 ENCOUNTER — Encounter: Payer: Self-pay | Admitting: Internal Medicine

## 2018-09-05 ENCOUNTER — Ambulatory Visit (INDEPENDENT_AMBULATORY_CARE_PROVIDER_SITE_OTHER): Payer: Medicare HMO | Admitting: Internal Medicine

## 2018-09-05 ENCOUNTER — Observation Stay (HOSPITAL_COMMUNITY)
Admission: EM | Admit: 2018-09-05 | Discharge: 2018-09-07 | Disposition: A | Discharge status: Home / Self Care | Payer: Medicare HMO | Attending: Internal Medicine | Admitting: Internal Medicine

## 2018-09-05 ENCOUNTER — Other Ambulatory Visit (INDEPENDENT_AMBULATORY_CARE_PROVIDER_SITE_OTHER): Payer: Medicare HMO

## 2018-09-05 ENCOUNTER — Other Ambulatory Visit: Payer: Self-pay

## 2018-09-05 VITALS — BP 110/60 | HR 75 | Ht 66.0 in | Wt 132.0 lb

## 2018-09-05 DIAGNOSIS — K7031 Alcoholic cirrhosis of liver with ascites: Secondary | ICD-10-CM

## 2018-09-05 DIAGNOSIS — N179 Acute kidney failure, unspecified: Secondary | ICD-10-CM | POA: Insufficient documentation

## 2018-09-05 DIAGNOSIS — I8501 Esophageal varices with bleeding: Secondary | ICD-10-CM

## 2018-09-05 DIAGNOSIS — D62 Acute posthemorrhagic anemia: Principal | ICD-10-CM | POA: Insufficient documentation

## 2018-09-05 DIAGNOSIS — F1010 Alcohol abuse, uncomplicated: Secondary | ICD-10-CM | POA: Diagnosis not present

## 2018-09-05 DIAGNOSIS — I10 Essential (primary) hypertension: Secondary | ICD-10-CM | POA: Insufficient documentation

## 2018-09-05 DIAGNOSIS — K766 Portal hypertension: Secondary | ICD-10-CM | POA: Insufficient documentation

## 2018-09-05 DIAGNOSIS — F17210 Nicotine dependence, cigarettes, uncomplicated: Secondary | ICD-10-CM | POA: Insufficient documentation

## 2018-09-05 DIAGNOSIS — K7030 Alcoholic cirrhosis of liver without ascites: Secondary | ICD-10-CM | POA: Diagnosis present

## 2018-09-05 DIAGNOSIS — B182 Chronic viral hepatitis C: Secondary | ICD-10-CM | POA: Insufficient documentation

## 2018-09-05 DIAGNOSIS — Z8711 Personal history of peptic ulcer disease: Secondary | ICD-10-CM | POA: Insufficient documentation

## 2018-09-05 DIAGNOSIS — K922 Gastrointestinal hemorrhage, unspecified: Secondary | ICD-10-CM

## 2018-09-05 DIAGNOSIS — I8511 Secondary esophageal varices with bleeding: Secondary | ICD-10-CM | POA: Diagnosis not present

## 2018-09-05 DIAGNOSIS — C220 Liver cell carcinoma: Secondary | ICD-10-CM | POA: Diagnosis present

## 2018-09-05 DIAGNOSIS — K921 Melena: Secondary | ICD-10-CM | POA: Insufficient documentation

## 2018-09-05 DIAGNOSIS — K269 Duodenal ulcer, unspecified as acute or chronic, without hemorrhage or perforation: Secondary | ICD-10-CM | POA: Diagnosis not present

## 2018-09-05 DIAGNOSIS — D649 Anemia, unspecified: Secondary | ICD-10-CM | POA: Diagnosis not present

## 2018-09-05 DIAGNOSIS — F10288 Alcohol dependence with other alcohol-induced disorder: Secondary | ICD-10-CM | POA: Diagnosis not present

## 2018-09-05 DIAGNOSIS — K3189 Other diseases of stomach and duodenum: Secondary | ICD-10-CM | POA: Diagnosis not present

## 2018-09-05 DIAGNOSIS — B1920 Unspecified viral hepatitis C without hepatic coma: Secondary | ICD-10-CM | POA: Insufficient documentation

## 2018-09-05 DIAGNOSIS — D696 Thrombocytopenia, unspecified: Secondary | ICD-10-CM | POA: Insufficient documentation

## 2018-09-05 DIAGNOSIS — K2210 Ulcer of esophagus without bleeding: Secondary | ICD-10-CM | POA: Insufficient documentation

## 2018-09-05 DIAGNOSIS — Z79899 Other long term (current) drug therapy: Secondary | ICD-10-CM | POA: Diagnosis not present

## 2018-09-05 DIAGNOSIS — I8510 Secondary esophageal varices without bleeding: Secondary | ICD-10-CM | POA: Diagnosis not present

## 2018-09-05 LAB — CBC WITH DIFFERENTIAL/PLATELET
BASOS PCT: 1.2 % (ref 0.0–3.0)
Basophils Absolute: 0.1 10*3/uL (ref 0.0–0.1)
EOS PCT: 2.6 % (ref 0.0–5.0)
Eosinophils Absolute: 0.2 10*3/uL (ref 0.0–0.7)
HCT: 19.3 % — CL (ref 39.0–52.0)
Lymphocytes Relative: 25.1 % (ref 12.0–46.0)
Lymphs Abs: 1.9 10*3/uL (ref 0.7–4.0)
MCHC: 33.3 g/dL (ref 30.0–36.0)
MCV: 101.7 fl — AB (ref 78.0–100.0)
MONOS PCT: 11.2 % (ref 3.0–12.0)
Monocytes Absolute: 0.8 10*3/uL (ref 0.1–1.0)
Neutro Abs: 4.5 10*3/uL (ref 1.4–7.7)
Neutrophils Relative %: 59.9 % (ref 43.0–77.0)
Platelets: 129 10*3/uL — ABNORMAL LOW (ref 150.0–400.0)
RDW: 17 % — AB (ref 11.5–15.5)
WBC: 7.5 10*3/uL (ref 4.0–10.5)

## 2018-09-05 LAB — PROTIME-INR
INR: 1.5 ratio — AB (ref 0.8–1.0)
PROTHROMBIN TIME: 17.9 s — AB (ref 9.6–13.1)

## 2018-09-05 LAB — COMPREHENSIVE METABOLIC PANEL
ALT: 36 U/L (ref 0–53)
AST: 96 U/L — ABNORMAL HIGH (ref 0–37)
Albumin: 2.5 g/dL — ABNORMAL LOW (ref 3.5–5.2)
Alkaline Phosphatase: 107 U/L (ref 39–117)
BUN: 32 mg/dL — ABNORMAL HIGH (ref 6–23)
CO2: 25 mEq/L (ref 19–32)
Calcium: 8 mg/dL — ABNORMAL LOW (ref 8.4–10.5)
Chloride: 108 mEq/L (ref 96–112)
Creatinine, Ser: 1.15 mg/dL (ref 0.40–1.50)
GFR: 81.48 mL/min (ref >60.00)
Glucose, Bld: 115 mg/dL — ABNORMAL HIGH (ref 70–99)
Potassium: 3.7 mEq/L (ref 3.5–5.1)
Sodium: 138 mEq/L (ref 135–145)
Total Bilirubin: 1.9 mg/dL — ABNORMAL HIGH (ref 0.2–1.2)
Total Protein: 7 g/dL (ref 6.0–8.3)

## 2018-09-05 LAB — PREPARE RBC (CROSSMATCH)

## 2018-09-05 MED ORDER — OCTREOTIDE LOAD VIA INFUSION
50.0000 ug | Freq: Once | INTRAVENOUS | Status: AC
  Administered 2018-09-05: 50 ug via INTRAVENOUS
  Filled 2018-09-05: qty 25

## 2018-09-05 MED ORDER — ONDANSETRON HCL 4 MG/2ML IJ SOLN: 4.0000 mg | Freq: Four times a day (QID) | INTRAMUSCULAR | Status: DC | PRN

## 2018-09-05 MED ORDER — SODIUM CHLORIDE 0.9 % IV SOLN
80.0000 mg | Freq: Once | INTRAVENOUS | Status: AC
  Administered 2018-09-05: 80 mg via INTRAVENOUS
  Filled 2018-09-05: qty 80

## 2018-09-05 MED ORDER — LORAZEPAM 2 MG/ML IJ SOLN: 0.0000 mg | Freq: Four times a day (QID) | INTRAMUSCULAR | Status: DC

## 2018-09-05 MED ORDER — SODIUM CHLORIDE 0.9% FLUSH
3.0000 mL | Freq: Two times a day (BID) | INTRAVENOUS | Status: DC
  Administered 2018-09-06: 3 mL via INTRAVENOUS

## 2018-09-05 MED ORDER — SODIUM CHLORIDE 0.9 % IV SOLN: 80.0000 mg | Freq: Once | INTRAVENOUS | Status: DC

## 2018-09-05 MED ORDER — THIAMINE HCL 100 MG/ML IJ SOLN: 100.0000 mg | Freq: Every day | INTRAMUSCULAR | Status: DC

## 2018-09-05 MED ORDER — LORAZEPAM 2 MG/ML IJ SOLN: 1.0000 mg | Freq: Four times a day (QID) | INTRAMUSCULAR | Status: DC | PRN

## 2018-09-05 MED ORDER — SODIUM CHLORIDE 0.9 % IV SOLN: 2.0000 g | INTRAVENOUS | Status: DC

## 2018-09-05 MED ORDER — ONDANSETRON HCL 4 MG PO TABS: 4.0000 mg | ORAL_TABLET | Freq: Four times a day (QID) | ORAL | Status: DC | PRN

## 2018-09-05 MED ORDER — VITAMIN B-1 100 MG PO TABS
100.0000 mg | ORAL_TABLET | Freq: Every day | ORAL | Status: DC
  Administered 2018-09-06 – 2018-09-07 (×2): 100 mg via ORAL
  Filled 2018-09-05 (×2): qty 1

## 2018-09-05 MED ORDER — RIFAXIMIN 550 MG PO TABS: 550.0000 mg | ORAL_TABLET | Freq: Two times a day (BID) | ORAL | 2 refills | Status: DC

## 2018-09-05 MED ORDER — FENTANYL CITRATE (PF) 100 MCG/2ML IJ SOLN: 12.5000 ug | INTRAMUSCULAR | Status: DC | PRN

## 2018-09-05 MED ORDER — LORAZEPAM 2 MG/ML IJ SOLN: 0.0000 mg | Freq: Two times a day (BID) | INTRAMUSCULAR | Status: DC

## 2018-09-05 MED ORDER — ACETAMINOPHEN 325 MG PO TABS: 650.0000 mg | ORAL_TABLET | Freq: Four times a day (QID) | ORAL | Status: DC | PRN

## 2018-09-05 MED ORDER — SODIUM CHLORIDE 0.9% FLUSH: 3.0000 mL | INTRAVENOUS | Status: DC | PRN

## 2018-09-05 MED ORDER — NADOLOL 20 MG PO TABS: 20.0000 mg | ORAL_TABLET | Freq: Every day | ORAL | 2 refills | Status: AC

## 2018-09-05 MED ORDER — SODIUM CHLORIDE 0.9 % IV SOLN: 1.0000 g | Freq: Once | INTRAVENOUS | Status: DC

## 2018-09-05 MED ORDER — LORAZEPAM 1 MG PO TABS: 1.0000 mg | ORAL_TABLET | Freq: Four times a day (QID) | ORAL | Status: DC | PRN

## 2018-09-05 MED ORDER — ACETAMINOPHEN 650 MG RE SUPP: 650.0000 mg | Freq: Four times a day (QID) | RECTAL | Status: DC | PRN

## 2018-09-05 MED ORDER — SODIUM CHLORIDE 0.9 % IV SOLN
8.0000 mg/h | INTRAVENOUS | Status: DC
  Administered 2018-09-05 – 2018-09-06 (×2): 8 mg/h via INTRAVENOUS
  Filled 2018-09-05 (×4): qty 80

## 2018-09-05 MED ORDER — SODIUM CHLORIDE 0.9 % IV SOLN: 10.0000 mL/h | Freq: Once | INTRAVENOUS | Status: DC

## 2018-09-05 MED ORDER — PANTOPRAZOLE SODIUM 40 MG PO TBEC: 40.0000 mg | DELAYED_RELEASE_TABLET | Freq: Every day | ORAL | 2 refills | Status: DC

## 2018-09-05 MED ORDER — SODIUM CHLORIDE 0.9 % IV SOLN
2.0000 g | Freq: Every day | INTRAVENOUS | Status: DC
  Administered 2018-09-06 (×2): 2 g via INTRAVENOUS
  Filled 2018-09-05: qty 20
  Filled 2018-09-05: qty 2

## 2018-09-05 MED ORDER — SODIUM CHLORIDE 0.9 % IV SOLN
50.0000 ug/h | INTRAVENOUS | Status: DC
  Administered 2018-09-05 – 2018-09-07 (×4): 50 ug/h via INTRAVENOUS
  Filled 2018-09-05 (×5): qty 1

## 2018-09-05 MED ORDER — SODIUM CHLORIDE 0.9 % IV SOLN
250.0000 mL | INTRAVENOUS | Status: DC | PRN
  Administered 2018-09-06: 250 mL via INTRAVENOUS

## 2018-09-05 NOTE — Progress Notes (Addendum)
Subjective:    Patient ID: Peter Nelson, male    DOB: 08/31/1951, 67 y.o.   MRN: [id number]  HPI Peter Nelson is a 67 year old patient with alcoholic cirrhosis with portal hypertension, history of Peter Nelson now multifocal with recent admissions to the hospital for upper GI bleeding related to Mallory-Weiss tear in October 2019 and esophageal variceal hemorrhage status post EVL x 7 on 08/23/2018 by Dr. Ardis Hughs, here for follow-up.  He is here today with his ex-wife who he has been living with since his most recent discharge from the hospital.  He reports going back to the ER this past Saturday after developing hematemesis at home.  He was not admitted.  His last episode of hematemesis was on 09/02/2018 when he had 2 or 3 episodes at home.  This was maroon in color and since he has still continue to have small volume black stool.  He continues to have epigastric abdominal pain but denies chest pain.  Feels very weak.  No alcohol in the last 4 days.  Has had prior paracentesis which has helped his abdominal pain somewhat.  He is using oxycodone for his abdominal pain.  He has not really been using MiraLAX.  He reports compliance with nadolol 20 mg daily and pantoprazole 40 mg in the morning.  He is also on spironolactone 50 mg daily.  No overt confusion but his ex-wife notes that he has been very sleepy and at times some mild disorientation.  She reports he spends the majority of his time in bed and when he gets up to urinate or have a bowel movement he wants to return to the bed very quickly.  He saw Dr. Benay Spice on 08/29/2018 for his multifocal hepatocellular carcinoma.  He is not a candidate for curative therapy.  He was to be discussed at GI tumor conference tomorrow.  Review of Systems As per HPI, otherwise negative  Current Medications, Allergies, Past Medical History, Past Surgical History, Family History and Social History were reviewed in [employer] record.       Objective:   Physical Exam BP 110/60   Pulse 75   Ht 5' 6"  (1.676 m)   Wt 132 lb (59.9 kg)   BMI 21.31 kg/m  Constitutional: Chronically ill-appearing and cachectic male, no acute distress HEENT: Normocephalic and atraumatic.  Conjunctivae are normal.  No scleral icterus. Neck: Neck supple. Trachea midline. Cardiovascular: Normal rate, regular rhythm and intact distal pulses.  Pulmonary/chest: Effort normal and breath sounds normal. No wheezing, rales or rhonchi. Abdominal: Soft, thin, mildly protuberant with reducible umbilical hernia, nondistended. Bowel sounds active throughout.  Extremities: no clubbing, cyanosis, trace pretib edema Neurological: Alert and oriented to person place and time. No asterixis  Skin: Skin is warm and dry. Psychiatric: Normal mood and affect. Behavior is normal.  CBC    Component Value Date/Time   WBC 7.4 09/02/2018 1233   RBC 2.53 (L) 09/02/2018 1233   HGB 8.1 (L) 09/02/2018 1233   HGB 10.1 (L) 11/12/2016 1440   HCT 25.9 (L) 09/02/2018 1233   HCT 30.0 (L) 11/12/2016 1440   PLT 136 (L) 09/02/2018 1233   PLT 165 11/12/2016 1440   MCV 102.4 (H) 09/02/2018 1233   MCV 106.1 (H) 11/12/2016 1440   MCH 32.0 09/02/2018 1233   MCHC 31.3 09/02/2018 1233   RDW 14.8 09/02/2018 1233   RDW 15.6 (H) 11/12/2016 1440   LYMPHSABS 1.8 09/02/2018 1233   LYMPHSABS 1.7 11/12/2016 1440   MONOABS 0.9 09/02/2018  1233   MONOABS 0.9 11/12/2016 1440   EOSABS 0.2 09/02/2018 1233   EOSABS 0.3 11/12/2016 1440   BASOSABS 0.1 09/02/2018 1233   BASOSABS 0.1 11/12/2016 1440   CMP     Component Value Date/Time   NA 137 09/02/2018 1233   NA 141 11/12/2016 1440   K 4.8 09/02/2018 1233   K 3.5 11/12/2016 1440   CL 108 09/02/2018 1233   CO2 25 09/02/2018 1233   CO2 27 11/12/2016 1440   GLUCOSE 103 (H) 09/02/2018 1233   GLUCOSE 75 11/12/2016 1440   BUN 25 (H) 09/02/2018 1233   BUN 18.1 11/12/2016 1440   CREATININE 0.92 09/02/2018 1233   CREATININE 0.88 03/21/2017  1223   CREATININE 1.2 11/12/2016 1440   CALCIUM 7.8 (L) 09/02/2018 1233   CALCIUM 8.2 (L) 11/12/2016 1440   PROT 6.2 (L) 09/02/2018 1233   PROT 7.3 11/12/2016 1440   ALBUMIN 2.0 (L) 09/02/2018 1233   ALBUMIN 2.2 (L) 11/12/2016 1440   AST 66 (H) 09/02/2018 1233   AST 66 (H) 11/12/2016 1440   ALT 28 09/02/2018 1233   ALT 32 11/12/2016 1440   ALKPHOS 101 09/02/2018 1233   ALKPHOS 144 11/12/2016 1440   BILITOT 2.0 (H) 09/02/2018 1233   BILITOT 2.61 (H) 11/12/2016 1440   GFRNONAA >60 09/02/2018 1233   GFRNONAA >89 03/21/2017 1223   GFRAA >60 09/02/2018 1233   GFRAA >89 03/21/2017 1223   Lab Results  Component Value Date   INR 1.42 08/24/2018   INR 1.43 08/23/2018   INR 1.37 08/07/2018   PROTIME 15.6 (H) 11/12/2016   PROTIME 15.6 (H) 07/13/2016   PROTIME 13.2 11/13/2015   MRI ABDOMEN WITHOUT AND WITH CONTRAST   TECHNIQUE: Multiplanar multisequence MR imaging of the abdomen was performed both before and after the administration of intravenous contrast.   CONTRAST:  6 mL of Gadavist.   COMPARISON:  MRI of the abdomen 02/28/2017.   FINDINGS: Lower chest: Unremarkable.   Hepatobiliary: Liver has a shrunken appearance and nodular contour, indicative of underlying cirrhosis. Again noted is a well-defined ablation defect in the central aspect of the liver predominantly in segment 8 (best appreciated on axial image 32 of series 10/4). However, adjacent to this there is a new heterogeneously enhancing mass which is irregular in shape but estimated to measure approximately 5.6 x 6.1 x 6.3 cm (axial image 37 of series 10/4 and coronal image 64 of series 11). Several other lesions are noted elsewhere in the liver, also new compared to the prior examination, largest of which is in the inferior aspect of the right lobe involving segments 5 and 6 (axial image 51 of series 10/4 and coronal image 51 of series 11) measuring 4.1 x 3.9 x 4.4 cm. These lesions are predominantly T1  hypointense and slightly T2 hyperintense, with heterogeneous internal enhancement. Portions of the posterior aspect of the lesion adjacent to the ablation defect also demonstrate early arterial phase hyperenhancement (best appreciated on axial image 38 of series 1001). On delayed post gadolinium images these lesions appear to have pseudo capsules. No intra or extrahepatic biliary ductal dilatation. Gallbladder is only moderately distended. Gallbladder wall appears diffusely edematous, likely related to surrounding ascites.   Pancreas:  No pancreatic mass.  No pancreatic ductal dilatation.   Spleen: Spleen appears borderline enlarged measuring 11.0 x 4.4 x 12.9 cm (estimated splenic volume of 312 mL).   Adrenals/Urinary Tract: A subcentimeter T1 hypointense, T2 hyperintense, nonenhancing lesions in the lower pole of the left  kidney, compatible with simple cysts. Right kidney and bilateral adrenal glands are normal in appearance. No hydroureteronephrosis.   Stomach/Bowel: Visualized portions are unremarkable.   Vascular/Lymphatic: Aortic atherosclerosis, without evidence of aneurysm in the abdominal vasculature. Large filling defect in the proximal portal vein best appreciated on axial image 56 of series 10/5, with reconstitution of flow distally, suspicious for nonocclusive thrombus or tumor thrombus. Proximal gastric varices. No definite lymphadenopathy noted in the visualized abdomen.   Other:  Large volume of ascites.   Musculoskeletal: No aggressive appearing osseous lesions are noted in the visualized portions of the skeleton.   IMPRESSION: 1. Cirrhotic liver with multiple new hepatic lesions with imaging characteristics most compatible with multifocal hepatocellular carcinoma, as detailed above. The largest of these is adjacent to the previous ablation defect measuring approximately 5.6 x 6.1 x 6.3 cm. 2. Nonocclusive thrombus or tumor thrombus in the proximal  portal vein. This is associated with large proximal gastric varices and borderline splenomegaly. 3. Large volume of ascites. 4. Aortic atherosclerosis.     Electronically Signed   By: Vinnie Langton M.D.   On: 08/26/2018 12:46       Assessment & Plan:  67 year old patient with alcoholic cirrhosis with portal hypertension, history of Peter Nelson now multifocal with recent admissions to the hospital for upper GI bleeding related to Mallory-Weiss tear in October 2019 and esophageal variceal hemorrhage status post EVL x 7 on 08/23/2018 by Dr. Ardis Hughs, here for follow-up.  1.  Alcoholic cirrhosis with multifocal HCC/portal hypertension with known esophageal varices status post recent endoscopic variceal ligation --he looks very weak today and it sounds like he had additional upper GI bleeding 3 days ago, none since.  I am planning to recheck his labs today including hemoglobin, CMP and INR.  He may need to go back to the hospital if he has become more anemic.  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  In the interim he has follow-up with the cancer center in place. --We will have him continue pantoprazole 40 mg daily, nadolol 20 mg daily, spironolactone 50 mg daily.  --Consideration given to adding furosemide but I want to see his labs first. --He has oxycodone for pain related to his HCC --Adding rifaximin 550 mg twice daily for possibly low-grade encephalopathy, though no asterixis on exam --We will need follow-up endoscopy for repeat banding protocol until varices are eradicated; I will ask Dr. Ardis Hughs to assist in his hospital block in the next 2 to 3 weeks --I stressed the importance of remaining alcohol free --Repeat ultrasound and repeat paracentesis with 50 g of IV albumin if there is enough fluid for removal (abdomen is not tense but each time he has a paracentesis his pain has improved) --Follow-up in place for oncology; limited  life expectancy given decompensated liver disease with multifocal HCC  Addendum: The lab called and his hemoglobin has declined to 6.4 from 8.1 3 days ago I recommended that he go to the ER for evaluation, probable admission, blood transfusion. [place] [facility] consult team will be available

## 2018-09-05 NOTE — Telephone Encounter (Signed)
Dr. Benay Spice agrees to sign his Home Health orders. Inez Catalina, RN notified. Also informed her that he missed his appointment yesterday with Dr. Barry Dienes.

## 2018-09-05 NOTE — Patient Instructions (Signed)
Your provider has requested that you go to the basement level for lab work before leaving today. Press "B" on the elevator. The lab is located at the first door on the left as you exit the elevator.  We have sent the following medications to your pharmacy for you to pick up at your convenience: Xifaxan 550 mg twice daily  Continue Nadolol, sprionolactone and pantoprazole  You have been scheduled for an abdominal paracentesis at [facility] radiology (1st floor of hospital) on Thursday 09/07/18 at 10:00 am. Please arrive at least 15 minutes prior to your appointment time for registration. Should you need to reschedule this appointment for any reason, please call our office at [phone].  If you are age 81 or older, your body mass index should be between 23-30. Your Body mass index is 21.31 kg/m. If this is out of the aforementioned range listed, please consider follow up with your Primary Care Provider.  If you are age 43 or younger, your body mass index should be between 19-25. Your Body mass index is 21.31 kg/m. If this is out of the aformentioned range listed, please consider follow up with your Primary Care Provider.

## 2018-09-05 NOTE — ED Notes (Signed)
ED TO INPATIENT HANDOFF REPORT  Name/Age/Gender Peter Nelson 67 y.o. male  Code Status    Code Status Orders  (From admission, onward)         Start     Ordered   09/05/18 1954  Full code  Continuous     09/05/18 1954        Code Status History    Date Active Date Inactive Code Status Order ID Comments User Context   08/22/2018 2342 08/26/2018 1834 Full Code [id number]  Shela Leff, MD ED   08/07/2018 2112 08/10/2018 2212 Full Code [id number]  Rise Patience, MD ED   01/11/2017 1456 01/12/2017 2103 Full Code [id number]  Caren Griffins, MD Inpatient   12/29/2016 2014 01/01/2017 1525 Full Code [id number]  Vianne Bulls, MD ED   10/25/2016 2347 10/30/2016 1942 Full Code [id number]  Rise Patience, MD ED   10/25/2016 2249 10/25/2016 2346 Full Code [id number]  Varney Biles, MD ED   03/15/2016 2043 03/16/2016 1451 Full Code [id number]  Etta Quill, DO ED    Advance Directive Documentation     Most Recent Value  Type of Advance Directive  Healthcare Power of Attorney, Living will  Pre-existing out of facility DNR order (yellow form or pink MOST form)  -  "MOST" Form in Place?  -      Home/SNF/Other Home  Chief Complaint low blood count   Level of Care/Admitting Diagnosis ED Disposition    ED Disposition Condition [place]: [place] [100102]  Level of Care: Telemetry [5]  Admit to tele based on following criteria: Other see comments  Comments: GIB, known varices  Diagnosis: Symptomatic anemia [2633354]  Admitting Physician: Vianne Bulls [[id number]]  Attending Physician: Vianne Bulls [[id number]]  PT Class (Do Not Modify): Observation [104]  PT Acc Code (Do Not Modify): Observation [10022]       Medical History Past Medical History:  Diagnosis Date  . Alcohol abuse   . Allergy   . Arthritis   . Asthma   . Cancer ([place])    liver  . Cirrhosis ([place])   . Dysrhythmia   . Esophageal varices ([place])   .  Hepatitis C   . Hepatocellular carcinoma ([place])   . Hyperplastic colon polyp   . Hypertension   . Internal hemorrhoids   . Mallory-Weiss tear   . Shortness of breath dyspnea    with activity and anxiety  . Ulcerative colitis ([place])   . Wears glasses     Allergies Allergies  Allergen Reactions  . Aspirin Other (See Comments)    Does not take because of hep c    IV Location/Drains/Wounds Patient Lines/Drains/Airways Status   Active Line/Drains/Airways    Name:   Placement date:   Placement time:   Site:   Days:   Peripheral IV 09/05/18 Right Forearm   09/05/18    2023    Forearm   less than 1   Peripheral IV 09/05/18 Left;Posterior Forearm   09/05/18    2112    Forearm   less than 1          Labs/Imaging Results for orders placed or performed during the hospital encounter of 09/05/18 (from the past 48 hour(s))  Type and screen [place]     Status: None (Preliminary result)   Collection Time: 09/05/18  8:27 PM  Result Value Ref Range   ABO/RH(D) O NEG  Antibody Screen NEG    Sample Expiration 09/08/2018    Unit Number [id number]    Blood Component Type RED CELLS,LR    Unit division 00    Status of Unit ALLOCATED    Transfusion Status OK TO TRANSFUSE    Crossmatch Result      Compatible Performed at [facility] [address] [place], [place] [address]    Unit Number [id number]    Blood Component Type RED CELLS,LR    Unit division 00    Status of Unit ALLOCATED    Transfusion Status OK TO TRANSFUSE    Crossmatch Result Compatible   Prepare RBC     Status: None   Collection Time: 09/05/18  8:27 PM  Result Value Ref Range   Order Confirmation      ORDER PROCESSED BY BLOOD BANK Performed at [facility], [place] [address] [place], [place] [address]    No results found. None  Pending Labs Unresulted Labs (From admission, onward)    Start     Ordered   09/06/18 0500  Comprehensive metabolic  panel  Tomorrow morning,   R     09/05/18 1954   09/05/18 1827  Occult blood card to lab, stool RN will collect  Once,   STAT    Question:  Specimen to be collected by?  Answer:  RN will collect   09/05/18 1826          Vitals/Pain Today's Vitals   09/05/18 1803 09/05/18 1808 09/05/18 1853  BP: 105/62  (!) 141/77  Pulse: 77  67  Resp: 18  11  Temp: 98.4 F (36.9 C)    TempSrc: Oral    SpO2: 97%  100%  PainSc:  4      Isolation Precautions No active isolations  Medications Medications  0.9 %  sodium chloride infusion (has no administration in time range)  octreotide (SANDOSTATIN) 2 mcg/mL load via infusion 50 mcg (50 mcg Intravenous Bolus from Bag 09/05/18 2046)    And  octreotide (SANDOSTATIN) 500 mcg in sodium chloride 0.9 % 250 mL (2 mcg/mL) infusion (50 mcg/hr Intravenous New Bag/Given 09/05/18 2046)  pantoprazole (PROTONIX) 80 mg in sodium chloride 0.9 % 250 mL (0.32 mg/mL) infusion (has no administration in time range)  sodium chloride flush (NS) 0.9 % injection 3 mL (has no administration in time range)  sodium chloride flush (NS) 0.9 % injection 3 mL (has no administration in time range)  sodium chloride flush (NS) 0.9 % injection 3 mL (has no administration in time range)  0.9 %  sodium chloride infusion (has no administration in time range)  acetaminophen (TYLENOL) tablet 650 mg (has no administration in time range)    Or  acetaminophen (TYLENOL) suppository 650 mg (has no administration in time range)  ondansetron (ZOFRAN) tablet 4 mg (has no administration in time range)    Or  ondansetron (ZOFRAN) injection 4 mg (has no administration in time range)  fentaNYL (SUBLIMAZE) injection 12.5-25 mcg (has no administration in time range)  LORazepam (ATIVAN) tablet 1 mg (has no administration in time range)    Or  LORazepam (ATIVAN) injection 1 mg (has no administration in time range)  thiamine (VITAMIN B-1) tablet 100 mg (has no administration in time range)    Or   thiamine (B-1) injection 100 mg (has no administration in time range)  LORazepam (ATIVAN) injection 0-4 mg (has no administration in time range)    Followed by  LORazepam (ATIVAN) injection  0-4 mg (has no administration in time range)  cefTRIAXone (ROCEPHIN) 2 g in sodium chloride 0.9 % 100 mL IVPB (has no administration in time range)  pantoprazole (PROTONIX) 80 mg in sodium chloride 0.9 % 100 mL IVPB (80 mg Intravenous New Bag/Given 09/05/18 2121)    Mobility walks with person assist

## 2018-09-05 NOTE — Progress Notes (Addendum)
PHARMACY NOTE:  ANTIMICROBIAL DOSAGE ADJUSTMENT  Current antimicrobial regimen includes a mismatch between antimicrobial dosage and indication.   Current antimicrobial and dosage:  Rocephin 1 g IV q24 hr  Indication: SBP ppx  Renal Function:   Estimated Creatinine Clearance: 52.8 mL/min (by C-G formula based on SCr of 1.15 mg/dL). []      On intermittent HD, scheduled: []      On CRRT    Antimicrobial dosage has been changed to:  2g IV q24 hr    Additional Comments: At the request of [place] (inf dis) committee, the preferred dosing for Rocephin for SBP prophylaxis is 2g q24 hr rather than the 1g q24 recommended in the drug monograph.   Thank you for allowing pharmacy to be a part of this patient's care.  Reuel Boom, PharmD, BCPS [phone] 09/05/2018, 8:19 PM

## 2018-09-05 NOTE — ED Triage Notes (Signed)
Patient was seen at Dr. Vena Rua office today and was told to come to the ED for Hgb-6.4. Patient's wife reports that the patient was "vomiting blood and having rectal bleeding 3 days ago". Patient also has cirrhosis and was told today that  "he needed fluid drawn from the abdomen."
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
## 2018-09-05 NOTE — ED Notes (Signed)
Occult blood card at bedside.

## 2018-09-06 ENCOUNTER — Observation Stay (HOSPITAL_COMMUNITY): Payer: Medicare HMO | Admitting: Certified Registered Nurse Anesthetist

## 2018-09-06 ENCOUNTER — Encounter (HOSPITAL_COMMUNITY): Payer: Self-pay | Admitting: *Deleted

## 2018-09-06 ENCOUNTER — Encounter (HOSPITAL_COMMUNITY)
Admission: EM | Disposition: A | Discharge status: Home / Self Care | Payer: Self-pay | Source: Home / Self Care | Attending: Emergency Medicine

## 2018-09-06 DIAGNOSIS — K3189 Other diseases of stomach and duodenum: Secondary | ICD-10-CM | POA: Diagnosis not present

## 2018-09-06 DIAGNOSIS — K2211 Ulcer of esophagus with bleeding: Secondary | ICD-10-CM | POA: Diagnosis not present

## 2018-09-06 DIAGNOSIS — I10 Essential (primary) hypertension: Secondary | ICD-10-CM | POA: Diagnosis not present

## 2018-09-06 DIAGNOSIS — K7031 Alcoholic cirrhosis of liver with ascites: Secondary | ICD-10-CM | POA: Diagnosis not present

## 2018-09-06 DIAGNOSIS — K7030 Alcoholic cirrhosis of liver without ascites: Secondary | ICD-10-CM | POA: Diagnosis not present

## 2018-09-06 DIAGNOSIS — I8510 Secondary esophageal varices without bleeding: Secondary | ICD-10-CM | POA: Diagnosis not present

## 2018-09-06 DIAGNOSIS — C220 Liver cell carcinoma: Secondary | ICD-10-CM | POA: Diagnosis not present

## 2018-09-06 DIAGNOSIS — B182 Chronic viral hepatitis C: Secondary | ICD-10-CM | POA: Diagnosis not present

## 2018-09-06 DIAGNOSIS — K922 Gastrointestinal hemorrhage, unspecified: Secondary | ICD-10-CM | POA: Diagnosis not present

## 2018-09-06 DIAGNOSIS — D62 Acute posthemorrhagic anemia: Secondary | ICD-10-CM | POA: Diagnosis not present

## 2018-09-06 DIAGNOSIS — K920 Hematemesis: Secondary | ICD-10-CM

## 2018-09-06 DIAGNOSIS — K766 Portal hypertension: Secondary | ICD-10-CM | POA: Diagnosis not present

## 2018-09-06 DIAGNOSIS — K269 Duodenal ulcer, unspecified as acute or chronic, without hemorrhage or perforation: Secondary | ICD-10-CM | POA: Diagnosis not present

## 2018-09-06 DIAGNOSIS — B1920 Unspecified viral hepatitis C without hepatic coma: Secondary | ICD-10-CM | POA: Diagnosis not present

## 2018-09-06 DIAGNOSIS — K2210 Ulcer of esophagus without bleeding: Secondary | ICD-10-CM | POA: Diagnosis not present

## 2018-09-06 DIAGNOSIS — D649 Anemia, unspecified: Secondary | ICD-10-CM | POA: Diagnosis not present

## 2018-09-06 HISTORY — PX: BIOPSY: SHX5522

## 2018-09-06 HISTORY — PX: ESOPHAGOGASTRODUODENOSCOPY (EGD) WITH PROPOFOL: SHX5813

## 2018-09-06 LAB — HEMOGLOBIN AND HEMATOCRIT, BLOOD
HEMATOCRIT: 24.7 % — AB (ref 39.0–52.0)
Hemoglobin: 7.7 g/dL — ABNORMAL LOW (ref 13.0–17.0)

## 2018-09-06 LAB — COMPREHENSIVE METABOLIC PANEL
ALT: 34 U/L (ref 0–44)
AST: 81 U/L — ABNORMAL HIGH (ref 15–41)
Albumin: 2 g/dL — ABNORMAL LOW (ref 3.5–5.0)
Alkaline Phosphatase: 88 U/L (ref 38–126)
Anion gap: 5 (ref 5–15)
BUN: 28 mg/dL — ABNORMAL HIGH (ref 8–23)
CO2: 22 mmol/L (ref 22–32)
Calcium: 7.4 mg/dL — ABNORMAL LOW (ref 8.9–10.3)
Chloride: 114 mmol/L — ABNORMAL HIGH (ref 98–111)
Creatinine, Ser: 1.33 mg/dL — ABNORMAL HIGH (ref 0.61–1.24)
GFR calc Af Amer: >60 mL/min (ref >60)
GFR calc non Af Amer: 54 mL/min — ABNORMAL LOW (ref >60)
Glucose, Bld: 93 mg/dL (ref 70–99)
Potassium: 3.8 mmol/L (ref 3.5–5.1)
Sodium: 141 mmol/L (ref 135–145)
Total Bilirubin: 2.4 mg/dL — ABNORMAL HIGH (ref 0.3–1.2)
Total Protein: 5.7 g/dL — ABNORMAL LOW (ref 6.5–8.1)

## 2018-09-06 SURGERY — ESOPHAGOGASTRODUODENOSCOPY (EGD) WITH PROPOFOL
Anesthesia: Monitor Anesthesia Care

## 2018-09-06 MED ORDER — PROPOFOL 500 MG/50ML IV EMUL
INTRAVENOUS | Status: DC | PRN
  Administered 2018-09-06: 150 ug/kg/min via INTRAVENOUS

## 2018-09-06 MED ORDER — LIDOCAINE 2% (20 MG/ML) 5 ML SYRINGE
INTRAMUSCULAR | Status: DC | PRN
  Administered 2018-09-06: 60 mg via INTRAVENOUS

## 2018-09-06 MED ORDER — PANTOPRAZOLE SODIUM 40 MG PO TBEC
40.0000 mg | DELAYED_RELEASE_TABLET | Freq: Two times a day (BID) | ORAL | Status: DC
  Administered 2018-09-06 – 2018-09-07 (×3): 40 mg via ORAL
  Filled 2018-09-06 (×3): qty 1

## 2018-09-06 MED ORDER — SUCRALFATE 1 GM/10ML PO SUSP
1.0000 g | Freq: Three times a day (TID) | ORAL | Status: DC
  Administered 2018-09-06 – 2018-09-07 (×4): 1 g via ORAL
  Filled 2018-09-06 (×4): qty 10

## 2018-09-06 MED ORDER — LACTATED RINGERS IV SOLN
INTRAVENOUS | Status: DC | PRN
  Administered 2018-09-06: 11:00:00 via INTRAVENOUS

## 2018-09-06 MED ORDER — PROPOFOL 10 MG/ML IV BOLUS
INTRAVENOUS | Status: AC
  Filled 2018-09-06: qty 60

## 2018-09-06 MED ORDER — PROPOFOL 10 MG/ML IV BOLUS
INTRAVENOUS | Status: DC | PRN
  Administered 2018-09-06: 40 mg via INTRAVENOUS

## 2018-09-06 SURGICAL SUPPLY — 15 items

## 2018-09-06 NOTE — Op Note (Addendum)
[facility] Patient Name: Peter Nelson Procedure Date: 09/06/2018 MRN: [id number] Attending MD: Ladene Artist , MD Date of Birth: [birth year] CSN: [id number] Age: 67 Admit Type: Inpatient Procedure:                Upper GI endoscopy Indications:              Hematemesis Providers:                Pricilla Riffle. Fuller Plan, MD, Carlyn Reichert, RN, Cherylynn Ridges, Technician, Cathe Mons, CRNA Referring MD:             [facility] Medicines:                Monitored Anesthesia Care Complications:            No immediate complications. Estimated Blood Loss:     Estimated blood loss was minimal. Procedure:                Pre-Anesthesia Assessment:                           - Prior to the procedure, a History and Physical                            was performed, and patient medications and                            allergies were reviewed. The patient's tolerance of                            previous anesthesia was also reviewed. The risks                            and benefits of the procedure and the sedation                            options and risks were discussed with the patient.                            All questions were answered, and informed consent                            was obtained. Prior Anticoagulants: The patient has                            taken no previous anticoagulant or antiplatelet                            agents. ASA Grade Assessment: III - A patient with                            severe systemic disease. After reviewing the risks  and benefits, the patient was deemed in                            satisfactory condition to undergo the procedure.                           After obtaining informed consent, the endoscope was                            passed under direct vision. Throughout the                            procedure, the patient's blood pressure, pulse, and                oxygen saturations were monitored continuously. The                            [id number]-[id number] ([id number]) Olympus adult endoscope was                            introduced through the mouth, and advanced to the                            second part of duodenum. The upper GI endoscopy was                            accomplished without difficulty. The patient                            tolerated the procedure well. The [id number]-[id number]                            ([id number]) Olympus ERCP was introduced through the                            mouth, and advanced to the second part of duodenum. Scope In: Scope Out: Findings:      Many superficial esophageal ulcers with no bleeding and no stigmata of       recent bleeding were found in the distal esophagus due to recent       esophageal banding. The largest lesion was 10 mm in largest dimension.      Two columns of non-bleeding grade I varices were found in the distal       esophagus. Limited exam of the varices due to the severe ulcerations.       They were 4 mm in largest diameter. No stigmata of recent bleeding were       evident and no red wale signs were present. Stigmata of prior treatment       were evident. Evidence of partial eradication was visible.      The exam of the esophagus was otherwise normal.      Moderate portal hypertensive gastropathy was found in the entire       examined stomach.      A few localized erosions without bleeding were found in the duodenal       bulb.  A single 5 mm mucosal nodule with a localized distribution was found in       the proximal second portion of the duodenum. The duodenum was examined       with the side viewing endoscopy and the major ampulla was clearly       identified separate from the nodule. It was located under a fold.       Biopsies were taken with a cold forceps for histology.      The exam of the duodenum was otherwise normal. Impression:               - Non-bleeding esophageal  ulcers.                           - Non-bleeding grade I esophageal varices.                           - Portal hypertensive gastropathy.                           - Duodenal erosions without bleeding.                           - Mucosal nodule found in the proximal second                            duodenum. Biopsied. Moderate Sedation:      Not Applicable - Patient had care per Anesthesia. Recommendation:           - Return patient to hospital ward for ongoing care.                           - Full liquid diet today, advance as tolerated to                            prior diet tomorrow.                           - Continue present medications.                           - No aspirin, ibuprofen, naproxen, or other                            non-steroidal anti-inflammatory drugs long term.                           - Protonix (pantoprazole) 40 mg PO BID for 2 weeks                            then qam.                           - Sucralfate suspension 1 gram PO QID for 1 week.                           - GI follow up withe Dr. [name]. OK for discharge  later today or tomorrow.                           - Await pathology results. Procedure Code(s):        --- Professional ---                           [phone], Esophagogastroduodenoscopy, flexible,                            transoral; with biopsy, single or multiple Diagnosis Code(s):        --- Professional ---                           K22.10, Ulcer of esophagus without bleeding                           I85.00, Esophageal varices without bleeding                           K76.6, Portal hypertension                           K31.89, Other diseases of stomach and duodenum                           K26.9, Duodenal ulcer, unspecified as acute or                            chronic, without hemorrhage or perforation                           K92.0, Hematemesis CPT copyright 2018 [employer]. All  rights reserved. The codes documented in this report are preliminary and upon coder review may  be revised to meet current compliance requirements. Ladene Artist, MD 09/06/2018 12:06:17 PM This report has been signed electronically. Number of Addenda: 0

## 2018-09-06 NOTE — Transfer of Care (Signed)
Immediate Anesthesia Transfer of Care Note  Patient: Peter Nelson  Procedure(s) Performed: ESOPHAGOGASTRODUODENOSCOPY (EGD) WITH PROPOFOL ([name] )  Patient Location: PACU  Anesthesia Type:MAC  Level of Consciousness: drowsy, patient cooperative and responds to stimulation  Airway & Oxygen Therapy: Patient Spontanous Breathing and Patient connected to face mask oxygen  Post-op Assessment: Report given to RN and Post -op Vital signs reviewed and stable  Post vital signs: Reviewed and stable  Last Vitals:  Vitals Value Taken Time  BP 112/64 09/06/2018 12:04 PM  Temp    Pulse 64 09/06/2018 12:05 PM  Resp 14 09/06/2018 12:05 PM  SpO2 100 % 09/06/2018 12:05 PM  Vitals shown include unvalidated device data.  Last Pain:  Vitals:   09/06/18 1030  TempSrc: Oral  PainSc: 0-No pain         Complications: No apparent anesthesia complications

## 2018-09-06 NOTE — Progress Notes (Signed)
PROGRESS NOTE    Peter Nelson  PXT:[id number] DOB: 10/03/1951 DOA: 09/05/2018 PCP: [name], FNP   Brief Narrative:  HPI per Dr. [name] on 11/191/19 Peter Nelson is a 67 y.o. male with medical history significant for alcoholic liver cirrhosis and hepatocellular carcinoma, recently admitted with GI bleed, and status post eradication of esophageal varices by banding on 08/23/2018, now presenting to the emergency department with fatigue, shortness of breath, and melena.  Patient reports some hematemesis on 09/02/2018, but none since.  He has melena ongoing, but no maroon or red blood in his stool.  He saw his gastroenterologist in the clinic today, was noted to have worsening anemia on outpatient labs, and was directed to the ED.  Patient denies fevers, chills, acute abdominal pain, or cough.  He reports exertional dyspnea and fatigue.  He has continued to drink alcohol.  ED Course: Upon arrival to the ED, patient is found to be afebrile, saturating well on room air, and with vitals otherwise normal.  Chemistry panel is notable for a stable hyperbilirubinemia, mild elevation in AST, and creatinine 1.15, similar to priors.  CBC is notable for a chronic mild thrombocytopenia and a macrocytic anemia with hemoglobin of 6.4, down from 8.1 three days earlier.  Gastroenterology was consulted by the ED physician and recommended medical admission with patient to be n.p.o. and treated with Rocephin, octreotide, and Protonix.  Patient remained hemodynamically stable with no vomiting in the ED.   **Patient went for EGD today which showed many superficial nonbleeding esophageal ulcers and nonbleeding grade 1 esophageal varices along with portal hypertensive gastropathy and duodenal erosions without bleeding.  There is a mucosal nodule found in the proximal second duodenum which was biopsied.  GI recommended full liquid diet and continue PPI twice daily and monitoring patient.  GI recommending  discharge tomorrow if medically stable.  Assessment & Plan:   Principal Problem:   Symptomatic anemia Active Problems:   Alcoholic cirrhosis of liver (HCC)   Hepatocellular carcinoma (HCC)   UGIB (upper gastrointestinal bleed)   Duodenal nodule  Symptomatic anemia; recurrent UGIB in the setting of multiple esophageal ulcers and duodenal erosions without bleeding - Presented with fatigue, SOB, melena and Hgb of 6.4, down from 8.1 three days earlier  - He had varices eradicated with banding on 08/23/18, had episode of hematemesis on 09/02/18 but no vomiting since  - GI is consulting and much appreciated  - 2 units RBC ordered from ED  - Continued bowel rest, ppx Rocephin, IV PPI and octreotide, and check post-transfusion CBC   -Repeat hemoglobin/hematocrit was 7.7/24.7 -GI took the patient for endoscopy today and found multiple nonbleeding esophageal ulcers as well as erosions in the duodenum.  Patient also had a biopsy done.  GI recommended continue current medications and changing patient to a PPI twice daily for 2 weeks and then daily as well as starting patient on sucralfate grams p.o. 3 times daily before meals and bedtime -GI also recommended full liquid diet today and then advance diet as tolerated tomorrow -He still on octreotide and will defer to gastroenterology to discontinue as they have changed his Protonix to p.o. -Continue to monitor for signs and symptoms of bleeding and repeat CBC in the a.m.  Alcoholic cirrhosis  - Continues to drink  - Labs stable, abd soft and non-tender, no encephalopathy  - Currently was NPO for EGD, will resume diuretics, nadolol, and Xifaxin once appropriate for a diet  and once kidney function is slightly  improved -CIWA protocol  Hepatocellular carcinoma  - Followed by oncology and GI, not felt to be candidate for curative therapy   - He will continue oncology follow-up at D/C  Mild AKI Patient's BUN/creatinine went from 25/0.92 is now  28/1.23 -Avoid nephrotoxic medications and contrast if possible -Repeat CMP in the a.m.   DVT prophylaxis: SCDs Code Status: FULL CODE Family Communication: Discussed with family at bedside Disposition Plan:   Consultants:   [facility]   Procedures:  EGD Scope Out: Findings:      Many superficial esophageal ulcers with no bleeding and no stigmata of       recent bleeding were found in the distal esophagus due to recent       esophageal banding. The largest lesion was 10 mm in largest dimension.      Two columns of non-bleeding grade I varices were found in the distal       esophagus. Limited exam of the varices due to the severe ulcerations.       They were 4 mm in largest diameter. No stigmata of recent bleeding were       evident and no red wale signs were present. Stigmata of prior treatment       were evident. Evidence of partial eradication was visible.      The exam of the esophagus was otherwise normal.      Moderate portal hypertensive gastropathy was found in the entire       examined stomach.      A few localized erosions without bleeding were found in the duodenal       bulb.      A single 5 mm mucosal nodule with a localized distribution was found in       the proximal second portion of the duodenum. The duodenum was examined       with the side viewing endoscopy and the major ampulla was clearly       identified separate from the nodule. It was located under a fold.       Biopsies were taken with a cold forceps for histology.      The exam of the duodenum was otherwise normal. Impression:               - Non-bleeding esophageal ulcers.                           - Non-bleeding grade I esophageal varices.                           - Portal hypertensive gastropathy.                           - Duodenal erosions without bleeding.                           - Mucosal nodule found in the proximal second                            duodenum.  Biopsied.   Antimicrobials:  Anti-infectives (From admission, onward)   Start     Dose/Rate Route Frequency Ordered Stop   09/06/18 1800  cefTRIAXone (ROCEPHIN) 2 g in sodium chloride 0.9 % 100 mL IVPB  Status:  Discontinued     2  g 200 mL/hr over 30 Minutes Intravenous Every 24 hours 09/05/18 1954 09/05/18 2017   09/05/18 2030  cefTRIAXone (ROCEPHIN) 2 g in sodium chloride 0.9 % 100 mL IVPB     2 g 200 mL/hr over 30 Minutes Intravenous Daily at bedtime 09/05/18 2017     09/05/18 1915  cefTRIAXone (ROCEPHIN) 1 g in sodium chloride 0.9 % 100 mL IVPB  Status:  Discontinued     1 g 200 mL/hr over 30 Minutes Intravenous  Once 09/05/18 1905 09/05/18 2017     Subjective: Seen and examined at bedside prior to EGD and states this is the fourth time he has been in the hospital this month.  No chest pain, lightheadedness or dizziness.  Had a little bit of melena and bloody stool but none since then.  No other concerns or complaints at this time and ready to find out why he keeps bleeding.  Objective: Vitals:   09/06/18 1225 09/06/18 1227 09/06/18 1400 09/06/18 1800  BP: 128/76 128/76 118/68 126/80  Pulse: (!) 59 64 72 80  Resp: 13 16    Temp:      TempSrc:      SpO2: 100% 100%    Weight:      Height:        Intake/Output Summary (Last 24 hours) at 09/06/2018 1933 Last data filed at 09/06/2018 1150 Gross per 24 hour  Intake 936 ml  Output -  Net 936 ml   Filed Weights   09/06/18 0751 09/06/18 1030  Weight: 59 kg 59 kg   Examination: Physical Exam:  Constitutional: Thin AAM in NAD and appears calm  Eyes: Lids are normal but conjunctivae slightly pale, sclerae anicteric  ENMT: External Ears, Nose appear normal. Grossly normal hearing. Neck: Appears normal, supple, no cervical masses, normal ROM, no appreciable thyromegaly; no JVD Respiratory: Diminished to auscultation bilaterally, no wheezing, rales, rhonchi or crackles. Normal respiratory effort and patient is not tachypenic.  No accessory muscle use.  Cardiovascular: RRR, no murmurs / rubs / gallops. S1 and S2 auscultated. Abdomen: Soft, non-tender, non-distended. No masses palpated. No appreciable hepatosplenomegaly. Bowel sounds positive x4.  GU: Deferred. Musculoskeletal: No clubbing / cyanosis of digits/nails. Normal strength and muscle tone.  Skin: No rashes, lesions, ulcers on a limited skin evaluation. No induration; Warm and dry.  Neurologic: CN 2-12 grossly intact with no focal deficits. Romberg sign and cerebellar reflexes not assessed.  Psychiatric: Normal judgment and insight. Alert and oriented x 3. Anxious mood and appropriate affect.   Data Reviewed: I have personally reviewed following labs and imaging studies  CBC: Recent Labs  Lab 09/02/18 1233 09/05/18 1504 09/06/18 1015  WBC 7.4 7.5  --   NEUTROABS 4.5 4.5  --   HGB 8.1* 6.4 cL* 7.7*  HCT 25.9* 19.3 cL* 24.7*  MCV 102.4* 101.7*  --   PLT 136* 129.0*  --    Basic Metabolic Panel: Recent Labs  Lab 09/02/18 1233 09/05/18 1504 09/06/18 1015  NA 137 138 141  K 4.8 3.7 3.8  CL 108 108 114*  CO2 25 25 22   GLUCOSE 103* 115* 93  BUN 25* 32* 28*  CREATININE 0.92 1.15 1.33*  CALCIUM 7.8* 8.0* 7.4*   GFR: Estimated Creatinine Clearance: 45 mL/min (A) (by C-G formula based on SCr of 1.33 mg/dL (H)). Liver Function Tests: Recent Labs  Lab 09/02/18 1233 09/05/18 1504 09/06/18 1015  AST 66* 96* 81*  ALT 28 36 34  ALKPHOS 101 107 88  BILITOT 2.0*  1.9* 2.4*  PROT 6.2* 7.0 5.7*  ALBUMIN 2.0* 2.5* 2.0*   Recent Labs  Lab 09/02/18 1233  LIPASE 31   No results for input(s): AMMONIA in the last 168 hours. Coagulation Profile: Recent Labs  Lab 09/05/18 1504  INR 1.5*   Cardiac Enzymes: No results for input(s): CKTOTAL, CKMB, CKMBINDEX, TROPONINI in the last 168 hours. BNP (last 3 results) No results for input(s): PROBNP in the last 8760 hours. HbA1C: No results for input(s): HGBA1C in the last 72 hours. CBG: Recent Labs   Lab 09/02/18 1153  GLUCAP 88   Lipid Profile: No results for input(s): CHOL, HDL, LDLCALC, TRIG, CHOLHDL, LDLDIRECT in the last 72 hours. Thyroid Function Tests: No results for input(s): TSH, T4TOTAL, FREET4, T3FREE, THYROIDAB in the last 72 hours. Anemia Panel: No results for input(s): VITAMINB12, FOLATE, FERRITIN, TIBC, IRON, RETICCTPCT in the last 72 hours. Sepsis Labs: No results for input(s): PROCALCITON, LATICACIDVEN in the last 168 hours.  No results found for this or any previous visit (from the past 240 hour(s)).   Radiology Studies: No results found.  Scheduled Meds: . LORazepam  0-4 mg Intravenous Q6H   Followed by  . [START ON 09/07/2018] LORazepam  0-4 mg Intravenous Q12H  . pantoprazole  40 mg Oral BID  . sodium chloride flush  3 mL Intravenous Q12H  . sodium chloride flush  3 mL Intravenous Q12H  . sucralfate  1 g Oral TID AC & HS  . thiamine  100 mg Oral Daily   Or  . thiamine  100 mg Intravenous Daily   Continuous Infusions: . sodium chloride    . sodium chloride 250 mL (09/06/18 0416)  . cefTRIAXone (ROCEPHIN)  IV 2 g (09/06/18 0418)  . octreotide  (SANDOSTATIN)    IV infusion 50 mcg/hr (09/06/18 1654)    LOS: 0 days   Kerney Elbe, DO [facility] PAGER is on [place]  If 7PM-7AM, please contact night-coverage [email or web address] Password [facility] 09/06/2018, 7:33 PM

## 2018-09-06 NOTE — Anesthesia Preprocedure Evaluation (Addendum)
Anesthesia Evaluation  Patient identified by MRN, date of birth, ID band Patient awake    Reviewed: Allergy & Precautions, NPO status , Patient's Chart, lab work & pertinent test results  History of Anesthesia Complications Negative for: history of anesthetic complications  Airway Mallampati: II  TM Distance: >3 FB Neck ROM: Full    Dental  (+) Loose, Chipped, Missing,    Pulmonary shortness of breath, asthma , Current Smoker,    breath sounds clear to auscultation       Cardiovascular hypertension, + dysrhythmias  Rhythm:Regular Rate:Normal     Neuro/Psych PSYCHIATRIC DISORDERS negative neurological ROS     GI/Hepatic PUD, (+) Cirrhosis     substance abuse  alcohol use, Hepatitis -, CUpper GI bleed, h/o Mallory Weiss tear   Endo/Other  negative endocrine ROS  Renal/GU negative Renal ROS  negative genitourinary   Musculoskeletal  (+) Arthritis ,   Abdominal   Peds  Hematology  (+) anemia , Hgb 7.5   Anesthesia Other Findings UGIB, last drink yesterday morning  Reproductive/Obstetrics                            Anesthesia Physical  Anesthesia Plan  ASA: III  Anesthesia Plan: MAC   Post-op Pain Management:    Induction: Intravenous  PONV Risk Score and Plan: 0 and Treatment may vary due to age or medical condition and Propofol infusion  Airway Management Planned: Natural Airway and Simple Face Mask  Additional Equipment:   Intra-op Plan:   Post-operative Plan:   Informed Consent: I have reviewed the patients History and Physical, chart, labs and discussed the procedure including the risks, benefits and alternatives for the proposed anesthesia with the patient or authorized representative who has indicated his/her understanding and acceptance.   Dental advisory given  Plan Discussed with: CRNA  Anesthesia Plan Comments:         Anesthesia Quick Evaluation

## 2018-09-06 NOTE — Interval H&P Note (Signed)
History and Physical Interval Note:  09/06/2018 11:09 AM  Peter Nelson  has presented today for surgery, with the diagnosis of UGI bleed  The various methods of treatment have been discussed with the patient and family. After consideration of risks, benefits and other options for treatment, the patient has consented to  Procedure(s): ESOPHAGOGASTRODUODENOSCOPY (EGD) WITH PROPOFOL ([name]) as a surgical intervention .  The patient's history has been reviewed, patient examined, no change in status, stable for surgery.  I have reviewed the patient's chart and labs.  Questions were answered to the patient's satisfaction.     Pricilla Riffle. Fuller Plan

## 2018-09-06 NOTE — Progress Notes (Addendum)
     [facility] Progress Note   Chief Complaint:   Anemia, cirrhosis, recent variceal bleed   ASSESSMENT AND PLAN:   7. 67 yo male with decompensated cirrhosis (with multifocal HCC),  variceal bleed a few weeks ago. Back in ED 3 days ago with hematemesis and drop in hgb from 9.4 to 8.1 but sent home. Seen in office yesterday with weakness, confusion. Stat labs revealed significant drop in hgb to 6.4. Sent back to ED -he is s/p 2 units of blood yesterday. INR 1.5. Will check stat H+H -needs repeat EGD, this can be done this am. The risks and benefits of EGD were discussed and the patient agrees to proceed.  -continue PPI gtt, Octreotide gtt. -continue Rocephin for SBP prophylaxis -address LVP with fluid studies following treatment of bleeding  2. Recent HCC, not candidate for curative therapy per Oncology   Attending Physician Note    I have taken a history, examined the patient and reviewed the chart. I agree with the Advanced Practitioner's note, impression and recommendations.  Dr. Vena Rua note from yesterday serves as the full consult note. Recurrent UGI bleed with ABL anemia in patient with decompensated alcoholic cirrhosis, multifocal HCC. Suspect recurrent bleed from esophageal varices, M-W tear or esophageal ulceration post banding. Octreotide, pantoprazole, Rocephin. Trend CBC post transfusion. EGD today. Further evaluation and mgmt of [place] as outpatient.   Lucio Edward, MD FACG [phone]     OBJECTIVE:     Vital signs in last 24 hours: Temp:  [98.3 F (36.8 C)-99.1 F (37.3 C)] 98.3 F (36.8 C) (11/20 0735) Pulse Rate:  [64-80] 80 (11/20 0735) Resp:  [10-20] 20 (11/20 0735) BP: (97-141)/(52-77) 118/70 (11/20 0735) SpO2:  [97 %-100 %] 98 % (11/20 0735) Weight:  [59 kg-59.9 kg] 59 kg (11/20 0751) Last BM Date: 09/05/18 General:   Thin male in NAD EENT:  Normal hearing, non icteric sclera, conjunctive pink.  Heart:  Regular rate and rhythm; no  murmur.  No lower extremity edema   Pulm: Normal respiratory effort, lungs CTA bilaterally without wheezes or crackles. Abdomen:  Soft, nondistended, nontender.  Normal bowel sounds, no masses felt.       Neurologic:  Alert and  oriented x4;  grossly normal neurologically. Psych:  Pleasant, cooperative.  Normal mood and affect.   Intake/Output from previous day: 11/19 0701 - 11/20 0700 In: 630 [Blood:630] Out: -  Intake/Output this shift: No intake/output data recorded.  Lab Results: Recent Labs    09/05/18 1504  WBC 7.5  HGB 6.4 cL*  HCT 19.3 cL*  PLT 129.0*   BMET Recent Labs    09/05/18 1504  NA 138  K 3.7  CL 108  CO2 25  GLUCOSE 115*  BUN 32*  CREATININE 1.15  CALCIUM 8.0*   LFT Recent Labs    09/05/18 1504  PROT 7.0  ALBUMIN 2.5*  AST 96*  ALT 36  ALKPHOS 107  BILITOT 1.9*   PT/INR Recent Labs    09/05/18 1504  LABPROT 17.9*  INR 1.5*     Principal Problem:   Symptomatic anemia Active Problems:   Alcoholic cirrhosis of liver (HCC)   Hepatocellular carcinoma (HCC)   Upper GI bleed     LOS: 0 days   Tye Savoy ,NP 09/06/2018, 9:15 AM

## 2018-09-06 NOTE — H&P (View-Only) (Signed)
     [facility] Progress Note   Chief Complaint:   Anemia, cirrhosis, recent variceal bleed   ASSESSMENT AND PLAN:   98. 67 yo male with decompensated cirrhosis (with multifocal HCC),  variceal bleed a few weeks ago. Back in ED 3 days ago with hematemesis and drop in hgb from 9.4 to 8.1 but sent home. Seen in office yesterday with weakness, confusion. Stat labs revealed significant drop in hgb to 6.4. Sent back to ED -he is s/p 2 units of blood yesterday. INR 1.5. Will check stat H+H -needs repeat EGD, this can be done this am. The risks and benefits of EGD were discussed and the patient agrees to proceed.  -continue PPI gtt, Octreotide gtt. -continue Rocephin for SBP prophylaxis -address LVP with fluid studies following treatment of bleeding  2. Recent HCC, not candidate for curative therapy per Oncology   Attending Physician Note    I have taken a history, examined the patient and reviewed the chart. I agree with the Advanced Practitioner's note, impression and recommendations.  Dr. Vena Rua note from yesterday serves as the full consult note. Recurrent UGI bleed with ABL anemia in patient with decompensated alcoholic cirrhosis, multifocal HCC. Suspect recurrent bleed from esophageal varices, M-W tear or esophageal ulceration post banding. Octreotide, pantoprazole, Rocephin. Trend CBC post transfusion. EGD today. Further evaluation and mgmt of [place] as outpatient.   Lucio Edward, MD FACG [phone]     OBJECTIVE:     Vital signs in last 24 hours: Temp:  [98.3 F (36.8 C)-99.1 F (37.3 C)] 98.3 F (36.8 C) (11/20 0735) Pulse Rate:  [64-80] 80 (11/20 0735) Resp:  [10-20] 20 (11/20 0735) BP: (97-141)/(52-77) 118/70 (11/20 0735) SpO2:  [97 %-100 %] 98 % (11/20 0735) Weight:  [59 kg-59.9 kg] 59 kg (11/20 0751) Last BM Date: 09/05/18 General:   Thin male in NAD EENT:  Normal hearing, non icteric sclera, conjunctive pink.  Heart:  Regular rate and rhythm; no  murmur.  No lower extremity edema   Pulm: Normal respiratory effort, lungs CTA bilaterally without wheezes or crackles. Abdomen:  Soft, nondistended, nontender.  Normal bowel sounds, no masses felt.       Neurologic:  Alert and  oriented x4;  grossly normal neurologically. Psych:  Pleasant, cooperative.  Normal mood and affect.   Intake/Output from previous day: 11/19 0701 - 11/20 0700 In: 630 [Blood:630] Out: -  Intake/Output this shift: No intake/output data recorded.  Lab Results: Recent Labs    09/05/18 1504  WBC 7.5  HGB 6.4 cL*  HCT 19.3 cL*  PLT 129.0*   BMET Recent Labs    09/05/18 1504  NA 138  K 3.7  CL 108  CO2 25  GLUCOSE 115*  BUN 32*  CREATININE 1.15  CALCIUM 8.0*   LFT Recent Labs    09/05/18 1504  PROT 7.0  ALBUMIN 2.5*  AST 96*  ALT 36  ALKPHOS 107  BILITOT 1.9*   PT/INR Recent Labs    09/05/18 1504  LABPROT 17.9*  INR 1.5*     Principal Problem:   Symptomatic anemia Active Problems:   Alcoholic cirrhosis of liver (HCC)   Hepatocellular carcinoma (HCC)   Upper GI bleed     LOS: 0 days   Tye Savoy ,NP 09/06/2018, 9:15 AM

## 2018-09-07 ENCOUNTER — Ambulatory Visit (HOSPITAL_COMMUNITY): Payer: Medicare HMO | Attending: Internal Medicine

## 2018-09-07 DIAGNOSIS — K7030 Alcoholic cirrhosis of liver without ascites: Secondary | ICD-10-CM

## 2018-09-07 DIAGNOSIS — D649 Anemia, unspecified: Secondary | ICD-10-CM | POA: Diagnosis not present

## 2018-09-07 DIAGNOSIS — C220 Liver cell carcinoma: Secondary | ICD-10-CM | POA: Diagnosis not present

## 2018-09-07 DIAGNOSIS — K3189 Other diseases of stomach and duodenum: Secondary | ICD-10-CM | POA: Diagnosis not present

## 2018-09-07 DIAGNOSIS — K922 Gastrointestinal hemorrhage, unspecified: Secondary | ICD-10-CM

## 2018-09-07 DIAGNOSIS — D62 Acute posthemorrhagic anemia: Secondary | ICD-10-CM | POA: Diagnosis not present

## 2018-09-07 LAB — COMPREHENSIVE METABOLIC PANEL
ALBUMIN: 2.1 g/dL — AB (ref 3.5–5.0)
ALT: 35 U/L (ref 0–44)
AST: 82 U/L — AB (ref 15–41)
Alkaline Phosphatase: 91 U/L (ref 38–126)
Anion gap: 6 (ref 5–15)
BUN: 20 mg/dL (ref 8–23)
CHLORIDE: 109 mmol/L (ref 98–111)
CO2: 22 mmol/L (ref 22–32)
Calcium: 7.5 mg/dL — ABNORMAL LOW (ref 8.9–10.3)
Creatinine, Ser: 1.16 mg/dL (ref 0.61–1.24)
GFR calc Af Amer: >60 mL/min (ref >60)
GLUCOSE: 107 mg/dL — AB (ref 70–99)
POTASSIUM: 3.6 mmol/L (ref 3.5–5.1)
Sodium: 137 mmol/L (ref 135–145)
Total Bilirubin: 2.3 mg/dL — ABNORMAL HIGH (ref 0.3–1.2)
Total Protein: 6.4 g/dL — ABNORMAL LOW (ref 6.5–8.1)

## 2018-09-07 LAB — BPAM RBC
BLOOD PRODUCT EXPIRATION DATE: 201912042359
BLOOD PRODUCT EXPIRATION DATE: 201912142359
ISSUE DATE / TIME: 201911200045
ISSUE DATE / TIME: 201911200432
UNIT TYPE AND RH: 9500
Unit Type and Rh: 9500

## 2018-09-07 LAB — TYPE AND SCREEN
ABO/RH(D): O NEG
ANTIBODY SCREEN: NEGATIVE
UNIT DIVISION: 0
Unit division: 0

## 2018-09-07 LAB — CBC WITH DIFFERENTIAL/PLATELET
ABS IMMATURE GRANULOCYTES: 0.03 10*3/uL (ref 0.00–0.07)
BASOS ABS: 0.1 10*3/uL (ref 0.0–0.1)
BASOS PCT: 1 %
Eosinophils Absolute: 0.4 10*3/uL (ref 0.0–0.5)
Eosinophils Relative: 5 %
HEMATOCRIT: 26.3 % — AB (ref 39.0–52.0)
Hemoglobin: 8.3 g/dL — ABNORMAL LOW (ref 13.0–17.0)
IMMATURE GRANULOCYTES: 0 %
LYMPHS ABS: 1.7 10*3/uL (ref 0.7–4.0)
Lymphocytes Relative: 23 %
MCH: 31.4 pg (ref 26.0–34.0)
MCHC: 31.6 g/dL (ref 30.0–36.0)
MCV: 99.6 fL (ref 80.0–100.0)
MONOS PCT: 16 %
Monocytes Absolute: 1.2 10*3/uL — ABNORMAL HIGH (ref 0.1–1.0)
NEUTROS PCT: 55 %
Neutro Abs: 4 10*3/uL (ref 1.7–7.7)
PLATELETS: 115 10*3/uL — AB (ref 150–400)
RBC: 2.64 MIL/uL — ABNORMAL LOW (ref 4.22–5.81)
RDW: 19.3 % — AB (ref 11.5–15.5)
WBC: 7.3 10*3/uL (ref 4.0–10.5)
nRBC: 0.3 % — ABNORMAL HIGH (ref 0.0–0.2)

## 2018-09-07 LAB — MAGNESIUM: MAGNESIUM: 2 mg/dL (ref 1.7–2.4)

## 2018-09-07 LAB — PHOSPHORUS: Phosphorus: 2.5 mg/dL (ref 2.5–4.6)

## 2018-09-07 MED ORDER — PANTOPRAZOLE SODIUM 40 MG PO TBEC: DELAYED_RELEASE_TABLET | ORAL | 0 refills | Status: AC

## 2018-09-07 MED ORDER — THIAMINE HCL 100 MG PO TABS: 100.0000 mg | ORAL_TABLET | Freq: Every day | ORAL | 0 refills | Status: DC

## 2018-09-07 MED ORDER — SUCRALFATE 1 GM/10ML PO SUSP: 1.0000 g | Freq: Three times a day (TID) | ORAL | 0 refills | Status: DC

## 2018-09-07 NOTE — Discharge Summary (Signed)
Physician Discharge Summary  [name]:[id number] DOB: 27-Jul-1951 DOA: 09/05/2018  PCP: [name], FNP  Admit date: 09/05/2018 Discharge date: 09/07/2018  Admitted From: Home Disposition: Home  Recommendations for Outpatient Follow-up:  1. Follow up with PCP in 1-2 weeks 2. Follow up with Gastroenterology Dr. [name] within 1-2 weeks 3. Please obtain CMP/CBC, Mag, Phos in one week 4. Please follow up on the following pending results: EGD Biopsy Results   Home Health: No Equipment/Devices: None    Discharge Condition: Stable  CODE STATUS: FULL CODE Diet recommendation: Soft Heart Healthy Diet  Brief/Interim Summary: HPI per Dr. [name] on 11/191/19 Peter Nelson a 67 y.o.malewith medical history significant foralcoholic liver cirrhosis and hepatocellular carcinoma, recently admitted with GI bleed, and status post eradication of esophageal varices by banding on 08/23/2018, now presenting to the emergency department with fatigue, shortness of breath, and melena. Patient reports some hematemesis on 09/02/2018, but none since. He has melena ongoing, but no maroon or red blood in his stool. He saw his gastroenterologist in the clinic today, was noted to have worsening anemia on outpatient labs, and was directed to the ED. Patient denies fevers, chills, acute abdominal pain, or cough. He reports exertional dyspnea and fatigue. He has continued to drink alcohol.  ED Course:Upon arrival to the ED, patient is found to be afebrile, saturating well on room air, and with vitals otherwise normal. Chemistry panel is notable for a stable hyperbilirubinemia, mild elevation in AST, and creatinine 1.15, similar to priors. CBC is notable for a chronic mild thrombocytopenia and a macrocytic anemia with hemoglobin of 6.4, down from 8.1 threedays earlier. Gastroenterology was consulted by the ED physician and recommended medical admission with patient to be n.p.o. and  treated with Rocephin, octreotide, and Protonix. Patient remained hemodynamically stable with no vomiting in the ED.   **Patient went for EGD 09/06/18 which showed many superficial nonbleeding esophageal ulcers and nonbleeding grade 1 esophageal varices along with portal hypertensive gastropathy and duodenal erosions without bleeding.  There was a mucosal nodule found in the proximal second duodenum which was biopsied.  GI recommended full liquid diet and continue PPI twice daily and monitoring patient yesterday and diet was advanced.  Tolerated diet well without any issues and was deemed medically stable.  His hemoglobin/hematocrit actually improved and is 8.3/26.3 this morning.  Patient felt well and states that he had a brown stool this morning without any blood in it.  No other complaints or concerns.  Patient was deemed medically stable to be discharged at this time will need to follow-up with PCP as well as primary care physician in outpatient setting.  Discharge Diagnoses:  Principal Problem:   Symptomatic anemia Active Problems:   Alcoholic cirrhosis of liver (HCC)   Hepatocellular carcinoma (HCC)   UGIB (upper gastrointestinal bleed)   Duodenal nodule  Symptomatic anemia; recurrent UGIBin the setting of multiple esophageal ulcers and duodenal erosions without bleeding, improved  -Presented with fatigue, SOB, melena and Hgb of 6.4, down from 8.1 three days earlier -He had varices eradicated with banding on 08/23/18, had episode of hematemesis on 09/02/18 but no vomiting since -GI is consulting and much appreciated -2 units RBC ordered from ED -Continued bowel rest, ppx Rocephin, IV PPI and octreotide, and check post-transfusion CBC -Repeat hemoglobin/hematocrit after blood this AM was 8.3/26.3 -GI took the patient for endoscopy yesterday and found multiple nonbleeding esophageal ulcers as well as erosions in the duodenum.  Patient also had a biopsy done.  GI recommended  continue current medications and changing patient to a PPI twice daily for 2 weeks and then daily as well as starting patient on sucralfate grams p.o. 3 times daily before meals and bedtime -GI also recommended full liquid diet and then advance diet as tolerated today and patient tolerated well -Octreotide Discontinued;  -Per GI: " No aspirin, ibuprofen, naproxen, or other non-steroidal anti-inflammatory drugs long term." -Continue to monitor for Signs and Symptoms of bleeding and repeat CBC as an outpatient and follow up with PCP and GI   Alcoholic cirrhosis -Continued to drink; Alcohol Cessation given  -Labs stable, abd soft and non-tender, no encephalopathy - AST is still slightly elevated at 82 -Currently was NPO for EGD and then had CLD which was advanced to soft, will resume diuretics, nadolol, and Xifaxin at D/C -CIWA protocol; No signs of withdrawal  Hepatocellular carcinoma -Followed by oncology and GI, not felt to be candidate for curative therapy  - He will continue oncology follow-upat D/C  Mild AKI, improving Patient's BUN/creatinine went from 25/0.92 is now 28/1.33 -> 20/1.16 -Avoid nephrotoxic medications and contrast if possible -Resume Home Spironolactone  -Repeat CMP as an outpatient   Discharge Instructions Discharge Instructions    Call MD for:  difficulty breathing, headache or visual disturbances   Complete by:  As directed    Call MD for:  extreme fatigue   Complete by:  As directed    Call MD for:  hives   Complete by:  As directed    Call MD for:  persistant dizziness or light-headedness   Complete by:  As directed    Call MD for:  persistant nausea and vomiting   Complete by:  As directed    Call MD for:  redness, tenderness, or signs of infection (pain, swelling, redness, odor or green/yellow discharge around incision site)   Complete by:  As directed    Call MD for:  severe uncontrolled pain   Complete by:  As directed    Call MD for:   temperature >100.4   Complete by:  As directed    Diet - low sodium heart healthy   Complete by:  As directed    Discharge instructions   Complete by:  As directed    You were cared for by a hospitalist during your hospital stay. If you have any questions about your discharge medications or the care you received while you were in the hospital after you are discharged, you can call the unit and ask to speak with the hospitalist on call if the hospitalist that took care of you is not available. Once you are discharged, your primary care physician will handle any further medical issues. Please note that NO REFILLS for any discharge medications will be authorized once you are discharged, as it is imperative that you return to your primary care physician (or establish a relationship with a primary care physician if you do not have one) for your aftercare needs so that they can reassess your need for medications and monitor your lab values.  Follow up with PCP and Gastroenterology. Take all medications as prescribed and avoid alcohol. If symptoms change or worsen please return to the ED for evaluation   Increase activity slowly   Complete by:  As directed      Allergies as of 09/07/2018      Reactions   Aspirin Other (See Comments)   Does not take because of hep c      Medication List  TAKE these medications   metoCLOPramide 10 MG tablet Commonly known as:  REGLAN Take 10 mg by mouth 4 (four) times daily -  before meals and at bedtime.   multivitamin with minerals Tabs tablet Take 1 tablet by mouth daily.   nadolol 20 MG tablet Commonly known as:  CORGARD Take 1 tablet (20 mg total) by mouth daily.   oxyCODONE 5 MG immediate release tablet Commonly known as:  Oxy IR/ROXICODONE Take 1 tablet (5 mg total) by mouth every 6 (six) hours as needed for moderate pain.   pantoprazole 40 MG tablet Commonly known as:  PROTONIX Take 1 tablet (40 mg total) by mouth 2 (two) times daily for 14  days, THEN 1 tablet (40 mg total) daily. Start taking on:  09/07/2018 What changed:  See the new instructions.   polyethylene glycol packet Commonly known as:  MIRALAX / GLYCOLAX Take 17 g by mouth daily.   rifaximin 550 MG Tabs tablet Commonly known as:  XIFAXAN Take 1 tablet (550 mg total) by mouth 2 (two) times daily.   spironolactone 50 MG tablet Commonly known as:  ALDACTONE Take 1 tablet (50 mg total) by mouth daily.   sucralfate 1 GM/10ML suspension Commonly known as:  CARAFATE Take 10 mLs (1 g total) by mouth 4 (four) times daily -  before meals and at bedtime.   thiamine 100 MG tablet Take 1 tablet (100 mg total) by mouth daily. Start taking on:  09/08/2018   VITAMIN B-12 PO Take 1 tablet by mouth daily.   VITAMIN B-6 PO Take 1 tablet by mouth daily.   VITAMIN C PO Take 1 tablet by mouth daily.   VITAMIN D PO Take 1 tablet by mouth daily.   VITAMIN E PO Take 1 tablet by mouth daily.       Allergies  Allergen Reactions  . Aspirin Other (See Comments)    Does not take because of hep c   Consultations:  [facility]  Procedures/Studies: Mr Abdomen W Wo Contrast  Result Date: 08/26/2018 CLINICAL DATA:  67 year old male with history of cirrhosis secondary to hepatitis C and alcohol abuse. History of hepatocellular carcinoma status post ablation in March 2015. History of ascites and esophageal varices status post banding procedure. Recent presentation for evaluation of dark stools for the past 2 days and mild generalized abdominal pain. EXAM: MRI ABDOMEN WITHOUT AND WITH CONTRAST TECHNIQUE: Multiplanar multisequence MR imaging of the abdomen was performed both before and after the administration of intravenous contrast. CONTRAST:  6 mL of Gadavist. COMPARISON:  MRI of the abdomen 02/28/2017. FINDINGS: Lower chest: Unremarkable. Hepatobiliary: Liver has a shrunken appearance and nodular contour, indicative of underlying cirrhosis. Again noted is a  well-defined ablation defect in the central aspect of the liver predominantly in segment 8 (best appreciated on axial image 32 of series 10/4). However, adjacent to this there is a new heterogeneously enhancing mass which is irregular in shape but estimated to measure approximately 5.6 x 6.1 x 6.3 cm (axial image 37 of series 10/4 and coronal image 64 of series 11). Several other lesions are noted elsewhere in the liver, also new compared to the prior examination, largest of which is in the inferior aspect of the right lobe involving segments 5 and 6 (axial image 51 of series 10/4 and coronal image 51 of series 11) measuring 4.1 x 3.9 x 4.4 cm. These lesions are predominantly T1 hypointense and slightly T2 hyperintense, with heterogeneous internal enhancement. Portions of the posterior aspect of  the lesion adjacent to the ablation defect also demonstrate early arterial phase hyperenhancement (best appreciated on axial image 38 of series 1001). On delayed post gadolinium images these lesions appear to have pseudo capsules. No intra or extrahepatic biliary ductal dilatation. Gallbladder is only moderately distended. Gallbladder wall appears diffusely edematous, likely related to surrounding ascites. Pancreas:  No pancreatic mass.  No pancreatic ductal dilatation. Spleen: Spleen appears borderline enlarged measuring 11.0 x 4.4 x 12.9 cm (estimated splenic volume of 312 mL). Adrenals/Urinary Tract: A subcentimeter T1 hypointense, T2 hyperintense, nonenhancing lesions in the lower pole of the left kidney, compatible with simple cysts. Right kidney and bilateral adrenal glands are normal in appearance. No hydroureteronephrosis. Stomach/Bowel: Visualized portions are unremarkable. Vascular/Lymphatic: Aortic atherosclerosis, without evidence of aneurysm in the abdominal vasculature. Large filling defect in the proximal portal vein best appreciated on axial image 56 of series 10/5, with reconstitution of flow distally,  suspicious for nonocclusive thrombus or tumor thrombus. Proximal gastric varices. No definite lymphadenopathy noted in the visualized abdomen. Other:  Large volume of ascites. Musculoskeletal: No aggressive appearing osseous lesions are noted in the visualized portions of the skeleton. IMPRESSION: 1. Cirrhotic liver with multiple new hepatic lesions with imaging characteristics most compatible with multifocal hepatocellular carcinoma, as detailed above. The largest of these is adjacent to the previous ablation defect measuring approximately 5.6 x 6.1 x 6.3 cm. 2. 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 This is associated with large proximal gastric varices and borderline splenomegaly. 3. Large volume of ascites. 4. Aortic atherosclerosis. Electronically Signed   By: Vinnie Langton M.D.   On: 08/26/2018 12:46   US Paracentesis  Result Date: 08/24/2018 INDICATION: Patient with history of [place], esophageal varices s/p banding, liver cirrhosis secondary to ETOH abuse and hepatitis C with recurrent ascites. Request for diagnostic and therapeutic paracentesis today. EXAM: ULTRASOUND GUIDED DIAGNOSTIC AND THERAPEUTIC PARACENTESIS MEDICATIONS: 10 mL 1% lidocaine. COMPLICATIONS: None immediate. PROCEDURE: Informed written consent was obtained from the patient after a discussion of the risks, benefits and alternatives to treatment. A timeout was performed prior to the initiation of the procedure. Initial ultrasound scanning demonstrates a moderate amount of ascites within the right lower abdominal quadrant. The right lower abdomen was prepped and draped in the usual sterile fashion. 1% lidocaine was used for local anesthesia. Following this, a 19 gauge, 7-cm, Yueh catheter was introduced. An ultrasound image was saved for documentation purposes. The paracentesis was performed. The catheter was removed and a dressing was applied. The patient tolerated the procedure well without immediate post  procedural complication. FINDINGS: A total of approximately 1.8 L of clear yellow fluid was removed. Samples were sent to the laboratory as requested by the clinical team. IMPRESSION: Successful ultrasound-guided paracentesis yielding 1.8 liters of peritoneal fluid. Read by Candiss Norse, PA-C Electronically Signed   By: Aletta Edouard M.D.   On: 08/24/2018 17:18   US Liver Doppler  Result Date: 08/09/2018 CLINICAL DATA:  Cirrhosis, hep C, history hepatocellular carcinoma, status post previous microwave ablations EXAM: DUPLEX ULTRASOUND OF LIVER TECHNIQUE: Color and duplex Doppler ultrasound was performed to evaluate the hepatic in-flow and out-flow vessels. COMPARISON:  02/28/2017 FINDINGS: Portal Vein Velocities Main:  13 cm/sec Right:  10 cm/sec Left:  14 cm/sec Hepatic Vein Velocities Right:  86 cm/sec Middle:  20 cm/sec Left:  28 cm/sec Hepatic Artery Velocity:  98 cm/sec Splenic Vein Velocity:  20 cm/sec Varices: Not visualized Ascites: Small amount of perihepatic ascites Patent portal, hepatic and splenic veins  with normal directional flow. Negative for portal vein thrombus or occlusion. Heterogeneous liver with nodularity of the surface compatible with known cirrhosis. Solid hypoechoic hepatic lesions bilaterally, 1 measuring 1.5 cm in the left hepatic lobe lateral segment and a second the right inferior lesion slightly exophytic measuring 4.3 cm. This is concerning for progression of multifocal hepatocellular carcinoma versus metastatic disease. IMPRESSION: Patent portal, hepatic and splenic veins. Hepatic cirrhosis with bilateral hypoechoic solid hepatic lesions concerning for progression of multifocal hepatocellular carcinoma versus metastatic disease. Perihepatic ascites Electronically Signed   By: Jerilynn Mages.  Shick M.D.   On: 08/09/2018 18:02   EGD 09/06/18 Findings:      Many superficial esophageal ulcers with no bleeding and no stigmata of       recent bleeding were found in the distal  esophagus due to recent       esophageal banding. The largest lesion was 10 mm in largest dimension.      Two columns of non-bleeding grade I varices were found in the distal       esophagus. Limited exam of the varices due to the severe ulcerations.       They were 4 mm in largest diameter. No stigmata of recent bleeding were       evident and no red wale signs were present. Stigmata of prior treatment       were evident. Evidence of partial eradication was visible.      The exam of the esophagus was otherwise normal.      Moderate portal hypertensive gastropathy was found in the entire       examined stomach.      A few localized erosions without bleeding were found in the duodenal       bulb.      A single 5 mm mucosal nodule with a localized distribution was found in       the proximal second portion of the duodenum. The duodenum was examined       with the side viewing endoscopy and the major ampulla was clearly       identified separate from the nodule. It was located under a fold.       Biopsies were taken with a cold forceps for histology.      The exam of the duodenum was otherwise normal. Impression:               - Non-bleeding esophageal ulcers.                           - Non-bleeding grade I esophageal varices.                           - Portal hypertensive gastropathy.                           - Duodenal erosions without bleeding.                           - Mucosal nodule found in the proximal second                            duodenum. Biopsied.  Subjective: Seen and examined at bedside was doing well.  Patient improved and did well and was tolerating diet.  GI recommended discharging close follow-up.  Patient denied any more bleeding in his stool and states his stool was brown. No other concerns and ready to go home.  Discharge Exam: Vitals:   09/07/18 0557 09/07/18 1100  BP: 126/69 123/70  Pulse: 66 66  Resp: 20   Temp: 98.4 F (36.9 C) 98.2 F (36.8 C)  SpO2:  100% 100%   Vitals:   09/06/18 2009 09/07/18 0041 09/07/18 0557 09/07/18 1100  BP: (!) 141/75 128/68 126/69 123/70  Pulse: 74 70 66 66  Resp: 18 18 20    Temp: 99.4 F (37.4 C) 99.3 F (37.4 C) 98.4 F (36.9 C) 98.2 F (36.8 C)  TempSrc: Oral Oral Oral Oral  SpO2: 100% 100% 100% 100%  Weight:      Height:       General: Pt is alert, awake, not in acute distress; Poor Dentition  Cardiovascular: RRR, S1/S2 +, no rubs, no gallops Respiratory: Diminished bilaterally, no wheezing, no rhonchi Abdominal: Soft, NT, ND, bowel sounds + Extremities: no edema, no cyanosis  The results of significant diagnostics from this hospitalization (including imaging, microbiology, ancillary and laboratory) are listed below for reference.    Microbiology: No results found for this or any previous visit (from the past 240 hour(s)).   Labs: BNP (last 3 results) No results for input(s): BNP in the last 8760 hours. Basic Metabolic Panel: Recent Labs  Lab 09/02/18 1233 09/05/18 1504 09/06/18 1015 09/07/18 0641  NA 137 138 141 137  K 4.8 3.7 3.8 3.6  CL 108 108 114* 109  CO2 25 25 22 22   GLUCOSE 103* 115* 93 107*  BUN 25* 32* 28* 20  CREATININE 0.92 1.15 1.33* 1.16  CALCIUM 7.8* 8.0* 7.4* 7.5*  MG  --   --   --  2.0  PHOS  --   --   --  2.5   Liver Function Tests: Recent Labs  Lab 09/02/18 1233 09/05/18 1504 09/06/18 1015 09/07/18 0641  AST 66* 96* 81* 82*  ALT 28 36 34 35  ALKPHOS 101 107 88 91  BILITOT 2.0* 1.9* 2.4* 2.3*  PROT 6.2* 7.0 5.7* 6.4*  ALBUMIN 2.0* 2.5* 2.0* 2.1*   Recent Labs  Lab 09/02/18 1233  LIPASE 31   No results for input(s): AMMONIA in the last 168 hours. CBC: Recent Labs  Lab 09/02/18 1233 09/05/18 1504 09/06/18 1015 09/07/18 0641  WBC 7.4 7.5  --  7.3  NEUTROABS 4.5 4.5  --  4.0  HGB 8.1* 6.4 cL* 7.7* 8.3*  HCT 25.9* 19.3 cL* 24.7* 26.3*  MCV 102.4* 101.7*  --  99.6  PLT 136* 129.0*  --  115*   Cardiac Enzymes: No results for input(s):  CKTOTAL, CKMB, CKMBINDEX, TROPONINI in the last 168 hours. BNP: Invalid input(s): POCBNP CBG: Recent Labs  Lab 09/02/18 1153  GLUCAP 88   D-Dimer No results for input(s): DDIMER in the last 72 hours. Hgb A1c No results for input(s): HGBA1C in the last 72 hours. Lipid Profile No results for input(s): CHOL, HDL, LDLCALC, TRIG, CHOLHDL, LDLDIRECT in the last 72 hours. Thyroid function studies No results for input(s): TSH, T4TOTAL, T3FREE, THYROIDAB in the last 72 hours.  Invalid input(s): FREET3 Anemia work up No results for input(s): VITAMINB12, FOLATE, FERRITIN, TIBC, IRON, RETICCTPCT in the last 72 hours. Urinalysis    Component Value Date/Time   COLORURINE YELLOW 04/20/2017 2340   APPEARANCEUR CLEAR 04/20/2017 2340   LABSPEC 1.020 05/04/2017 0914   PHURINE 7.0 05/04/2017 0914   GLUCOSEU NEGATIVE 05/04/2017 0914  HGBUR MODERATE (A) 05/04/2017 0914   BILIRUBINUR SMALL (A) 05/04/2017 0914   KETONESUR NEGATIVE 05/04/2017 0914   PROTEINUR 100 (A) 05/04/2017 0914   UROBILINOGEN >=8.0 05/04/2017 0914   NITRITE POSITIVE (A) 05/04/2017 0914   LEUKOCYTESUR SMALL (A) 05/04/2017 0914   Sepsis Labs Invalid input(s): PROCALCITONIN,  WBC,  LACTICIDVEN Microbiology No results found for this or any previous visit (from the past 240 hour(s)).  Time coordinating discharge: 35 minutes  SIGNED:  Kerney Elbe, DO [facility] 09/07/2018, 11:44 AM Pager is on [place]  If 7PM-7AM, please contact night-coverage [email or web address] Password [email or web address]

## 2018-09-07 NOTE — Anesthesia Postprocedure Evaluation (Signed)
Anesthesia Post Note  Patient: Peter Nelson  Procedure(s) Performed: ESOPHAGOGASTRODUODENOSCOPY (EGD) WITH PROPOFOL ([name] ) BIOPSY     Patient location during evaluation: PACU Anesthesia Type: MAC Level of consciousness: awake and alert Pain management: pain level controlled Vital Signs Assessment: post-procedure vital signs reviewed and stable Respiratory status: spontaneous breathing Cardiovascular status: stable Anesthetic complications: no    Last Vitals:  Vitals:   09/07/18 0557 09/07/18 1100  BP: 126/69 123/70  Pulse: 66 66  Resp: 20   Temp: 36.9 C 36.8 C  SpO2: 100% 100%    Last Pain:  Vitals:   09/07/18 1100  TempSrc: Oral  PainSc: 0-No pain                 Nolon Nations

## 2018-09-08 ENCOUNTER — Encounter: Payer: Self-pay | Admitting: Gastroenterology

## 2018-09-08 ENCOUNTER — Ambulatory Visit (INDEPENDENT_AMBULATORY_CARE_PROVIDER_SITE_OTHER): Payer: Medicare HMO | Admitting: Family Medicine

## 2018-09-08 ENCOUNTER — Encounter: Payer: Self-pay | Admitting: Family Medicine

## 2018-09-08 VITALS — BP 133/84 | HR 86 | Temp 98.5°F | Resp 16 | Ht 66.0 in | Wt 136.0 lb

## 2018-09-08 DIAGNOSIS — K7031 Alcoholic cirrhosis of liver with ascites: Secondary | ICD-10-CM | POA: Diagnosis not present

## 2018-09-08 DIAGNOSIS — I8510 Secondary esophageal varices without bleeding: Secondary | ICD-10-CM | POA: Diagnosis not present

## 2018-09-08 DIAGNOSIS — I8511 Secondary esophageal varices with bleeding: Secondary | ICD-10-CM | POA: Diagnosis not present

## 2018-09-08 DIAGNOSIS — I10 Essential (primary) hypertension: Secondary | ICD-10-CM

## 2018-09-08 DIAGNOSIS — B182 Chronic viral hepatitis C: Secondary | ICD-10-CM | POA: Diagnosis not present

## 2018-09-08 DIAGNOSIS — R413 Other amnesia: Secondary | ICD-10-CM | POA: Diagnosis not present

## 2018-09-08 DIAGNOSIS — K7030 Alcoholic cirrhosis of liver without ascites: Secondary | ICD-10-CM | POA: Diagnosis not present

## 2018-09-08 DIAGNOSIS — D62 Acute posthemorrhagic anemia: Secondary | ICD-10-CM | POA: Diagnosis not present

## 2018-09-08 DIAGNOSIS — F1010 Alcohol abuse, uncomplicated: Secondary | ICD-10-CM

## 2018-09-08 DIAGNOSIS — F10288 Alcohol dependence with other alcohol-induced disorder: Secondary | ICD-10-CM | POA: Diagnosis not present

## 2018-09-08 DIAGNOSIS — C220 Liver cell carcinoma: Secondary | ICD-10-CM | POA: Diagnosis not present

## 2018-09-08 NOTE — Progress Notes (Signed)
Patient [name]   Progress Note: General Provider: Lanae Boast, FNP  SUBJECTIVE:   Peter Nelson is a 67 y.o. male who  has a past medical history of Alcohol abuse, Allergy, Arthritis, Asthma, Cancer ([place]), Cirrhosis ([place]), Dysrhythmia, Esophageal varices ([place]), Hepatitis C, Hepatocellular carcinoma (), Hyperplastic colon polyp, Hypertension, Internal hemorrhoids, Mallory-Weiss tear, Shortness of breath dyspnea, Ulcerative colitis ([place]), and Wears glasses.. Patient presents today for Hospitalization Follow-up and Hypertension Patient presents today after discharge from the hospital. Patient reports going to the hospital due to bloody emesis and tarry black stools. Patient was seen by GI on 09/05/2018 and had blood work performed. Results significant for hemoglobin of 6.4 down from 8.1 three days ago and 9.4 ten days ago.  BUN 31 with Cr of 1.15. His INR was only mildly elevated at 1.5.  He was subsequently called back when labs resulted and referred to the emergency room. Patient denies any episodes of bloody emesis or tarry stools since leaving the hospital on 09/07/2018.  Patient with a hx of hepatocellular carcinoma that is noncurative per oncology. Patient with an appt at the [facility] on 09/20/2018 at 9 am  He presents with his ex wife who reports that he has an increase in confusion and misunderstanding commands and information.   Review of Systems  Constitutional: Negative.   HENT: Negative.   Eyes: Negative.   Respiratory: Negative.   Cardiovascular: Negative.   Gastrointestinal: Negative.        Distention  Genitourinary: Negative.   Musculoskeletal: Negative.   Skin: Negative.   Neurological: Positive for weakness (generalized).  Psychiatric/Behavioral: Positive for substance abuse (ETOH. has not had in 1 week. ). Negative for depression and suicidal ideas.     OBJECTIVE: BP 133/84 (BP Location: Right Arm, Patient  Position: Sitting, Cuff Size: Normal)   Pulse 86   Temp 98.5 F (36.9 C) (Oral)   Resp 16   Ht 5' 6"  (1.676 m)   Wt 136 lb (61.7 kg)   SpO2 100%   BMI 21.95 kg/m   Wt Readings from Last 3 Encounters:  09/08/18 136 lb (61.7 kg)  09/06/18 130 lb 1.1 oz (59 kg)  09/05/18 132 lb (59.9 kg)     Physical Exam  Constitutional: He is oriented to person, place, and time. He appears well-developed and well-nourished. No distress.  HENT:  Head: Normocephalic and atraumatic.  Eyes: Pupils are equal, round, and reactive to light. Conjunctivae and EOM are normal.  Neck: Normal range of motion.  Cardiovascular: Normal rate, regular rhythm, normal heart sounds and intact distal pulses.  Pulmonary/Chest: Effort normal and breath sounds normal. No respiratory distress.  Abdominal: He exhibits distension. Bowel sounds are decreased. There is no tenderness.  Musculoskeletal: Normal range of motion.  Neurological: He is alert and oriented to person, place, and time.  Skin: Skin is warm and dry.  Psychiatric: He has a normal mood and affect. His behavior is normal. Thought content normal.  Nursing note and vitals reviewed.   ASSESSMENT/PLAN:  1. Hypertension, unspecified type No medication changes warranted at the present time. Will continue to monitor.   2. Esophageal varices in alcoholic cirrhosis (HCC) Patient followed by GI for this. Continue with follow up appts.  3. Hepatocellular carcinoma ([place]) Patient with appt on 09/20/2018 for this.   4. Alcoholic cirrhosis of liver with ascites ([place]) Encouraged rehab for ETOH abuse.   5. Alcohol abuse Encouraged rehab for ETOH abuse. 6. Memory loss  Discussed possible etiologies of this that include blood loss, ETOH use, malnutrition, carcinoma and cirrhosis. Encouraged patient and caregiver to discuss extension of home health hours.   Time Spent: 25 minutes face-to-face with this patient discussing problems, treatments, and answering patient's  questions.        The patient was given clear instructions to go to ER or return to medical center if symptoms do not improve, worsen or new problems develop. The patient verbalized understanding and agreed with plan of care.    Return in about 2 weeks (around 09/22/2018). Ms. Peter Nelson. Peter Canary, FNP-BC [facility] [address] [place], [place] [address] [phone]     This note has been created with Dragon speech recognition software and smart phrase technology. Any transcriptional errors are unintentional.

## 2018-09-08 NOTE — Patient Instructions (Addendum)
Try to eat small meals every few hours. If swelling worsens or if shortness of breath occurs, please go to the nearest emergency room. Please remember to contact your home health agency to ask about extended or overnight coverage.    Cirrhosis Cirrhosis is long-term (chronic) liver injury. The liver is your largest internal organ, and it performs many functions. The liver converts food into energy, removes toxic material from your blood, makes important proteins, and absorbs necessary vitamins from your diet. If you have cirrhosis, it means many of your healthy liver cells have been replaced by scar tissue. This prevents blood from flowing through your liver, which makes it difficult for your liver to function. This scarring is not reversible, but treatment can prevent it from getting worse. What are the causes? Hepatitis C and long-term alcohol abuse are the most common causes of cirrhosis. Other causes include:  Nonalcoholic fatty liver disease.  Hepatitis B infection.  Autoimmune hepatitis.  Diseases that cause blockage of ducts inside the liver.  Inherited liver diseases.  Reactions to certain long-term medicines.  Parasitic infections.  Long-term exposure to certain toxins.  What increases the risk? You may have a higher risk of cirrhosis if you:  Have certain hepatitis viruses.  Abuse alcohol, especially if you are male.  Are overweight.  Share needles.  Have unprotected sex with someone who has hepatitis.  What are the signs or symptoms? You may not have any signs and symptoms at first. Symptoms may not develop until the damage to your liver starts to get worse. Signs and symptoms of cirrhosis may include:  Tenderness in the right-upper part of your abdomen.  Weakness and tiredness (fatigue).  Loss of appetite.  Nausea.  Weight loss and muscle loss.  Itchiness.  Yellow skin and eyes (jaundice).  Buildup of fluid in the abdomen (ascites).  Swelling of  the feet and ankles (edema).  Appearance of tiny blood vessels under the skin.  Mental confusion.  Easy bruising and bleeding.  How is this diagnosed? Your health care provider may suspect cirrhosis based on your symptoms and medical history, especially if you have other medical conditions or a history of alcohol abuse. Your health care provider will do a physical exam to feel your liver and check for signs of cirrhosis. Your health care provider may perform other tests, including:  Blood tests to check: ? Whether you have hepatitis B or C. ? Kidney function. ? Liver function.  Imaging tests such as: ? MRI or CT scan to look for changes seen in advanced cirrhosis. ? Ultrasound to see if normal liver tissue is being replaced by scar tissue.  A procedure using a long needle to take a sample of liver tissue (biopsy) for examination under a microscope. Liver biopsy can confirm the diagnosis of cirrhosis.  How is this treated? Treatment depends on how damaged your liver is and what caused the damage. Treatment may include treating cirrhosis symptoms or treating the underlying causes of the condition to try to slow the progression of the damage. Treatment may include:  Making lifestyle changes, such as: ? Eating a healthy diet. ? Restricting salt intake. ? Maintaining a healthy weight. ? Not abusing drugs or alcohol.  Taking medicines to: ? Treat liver infections or other infections. ? Control itching. ? Reduce fluid buildup. ? Reduce certain blood toxins. ? Reduce risk of bleeding from enlarged blood vessels in the stomach or esophagus (varices).  If varices are causing bleeding problems, you may need treatment with  a procedure that ties up the vessels causing them to fall off (band ligation).  If cirrhosis is causing your liver to fail, your health care provider may recommend a liver transplant.  Other treatments may be recommended depending on any complications of cirrhosis,  such as liver-related kidney failure (hepatorenal syndrome).  Follow these instructions at home:  Take medicines only as directed by your health care provider. Do not use drugs that are toxic to your liver. Ask your health care provider before taking any new medicines, including over-the-counter medicines.  Rest as needed.  Eat a well-balanced diet. Ask your health care provider or dietitian for more information.  You may have to follow a low-salt diet or restrict your water intake as directed.  Do not drink alcohol. This is especially important if you are taking acetaminophen.  Keep all follow-up visits as directed by your health care provider. This is important. Contact a health care provider if:  You have fatigue or weakness that is getting worse.  You develop swelling of the hands, feet, legs, or face.  You have a fever.  You develop loss of appetite.  You have nausea or vomiting.  You develop jaundice.  You develop easy bruising or bleeding. Get help right away if:  You vomit bright red blood or a material that looks like coffee grounds.  You have blood in your stools.  Your stools appear black and tarry.  You become confused.  You have chest pain or trouble breathing. This information is not intended to replace advice given to you by your health care provider. Make sure you discuss any questions you have with your health care provider. Document Released: 10/04/2005 Document Revised: 02/12/2016 Document Reviewed: 06/12/2014 Elsevier Interactive Patient Education  [employer].

## 2018-09-11 ENCOUNTER — Telehealth: Payer: Self-pay | Admitting: *Deleted

## 2018-09-11 NOTE — Telephone Encounter (Signed)
Home health RN requested order to discharge from home health care since patient continues to drink and has frequent GI bleeds and hospital admissions. Called back and left message to please continue home health until patient is seen in office on 09/20/18 and we will re-evaluate need at that time.

## 2018-09-12 ENCOUNTER — Emergency Department (HOSPITAL_COMMUNITY): Payer: Medicare HMO

## 2018-09-12 ENCOUNTER — Encounter (HOSPITAL_COMMUNITY): Payer: Self-pay

## 2018-09-12 ENCOUNTER — Emergency Department (HOSPITAL_COMMUNITY)
Admission: EM | Admit: 2018-09-12 | Discharge: 2018-09-12 | Disposition: A | Discharge status: Home / Self Care | Payer: Medicare HMO | Attending: Emergency Medicine | Admitting: Emergency Medicine

## 2018-09-12 ENCOUNTER — Telehealth: Payer: Self-pay | Admitting: *Deleted

## 2018-09-12 DIAGNOSIS — R0789 Other chest pain: Secondary | ICD-10-CM | POA: Insufficient documentation

## 2018-09-12 DIAGNOSIS — F17210 Nicotine dependence, cigarettes, uncomplicated: Secondary | ICD-10-CM | POA: Diagnosis not present

## 2018-09-12 DIAGNOSIS — I10 Essential (primary) hypertension: Secondary | ICD-10-CM | POA: Diagnosis not present

## 2018-09-12 DIAGNOSIS — Z8505 Personal history of malignant neoplasm of liver: Secondary | ICD-10-CM | POA: Diagnosis not present

## 2018-09-12 DIAGNOSIS — R188 Other ascites: Secondary | ICD-10-CM

## 2018-09-12 DIAGNOSIS — K7031 Alcoholic cirrhosis of liver with ascites: Secondary | ICD-10-CM | POA: Diagnosis not present

## 2018-09-12 DIAGNOSIS — R079 Chest pain, unspecified: Secondary | ICD-10-CM | POA: Diagnosis not present

## 2018-09-12 DIAGNOSIS — Z79899 Other long term (current) drug therapy: Secondary | ICD-10-CM | POA: Diagnosis not present

## 2018-09-12 DIAGNOSIS — R609 Edema, unspecified: Secondary | ICD-10-CM | POA: Diagnosis not present

## 2018-09-12 LAB — PROTIME-INR
INR: 1.43
Prothrombin Time: 17.3 seconds — ABNORMAL HIGH (ref 11.4–15.2)

## 2018-09-12 LAB — BASIC METABOLIC PANEL
ANION GAP: 7 (ref 5–15)
BUN: 15 mg/dL (ref 8–23)
CO2: 23 mmol/L (ref 22–32)
Calcium: 7.9 mg/dL — ABNORMAL LOW (ref 8.9–10.3)
Chloride: 106 mmol/L (ref 98–111)
Creatinine, Ser: 0.97 mg/dL (ref 0.61–1.24)
Glucose, Bld: 93 mg/dL (ref 70–99)
Potassium: 3.7 mmol/L (ref 3.5–5.1)
SODIUM: 136 mmol/L (ref 135–145)

## 2018-09-12 LAB — HEMOGLOBIN AND HEMATOCRIT, BLOOD
HCT: 24.6 % — ABNORMAL LOW (ref 39.0–52.0)
Hemoglobin: 8 g/dL — ABNORMAL LOW (ref 13.0–17.0)

## 2018-09-12 LAB — CBC
HCT: 28.8 % — ABNORMAL LOW (ref 39.0–52.0)
Hemoglobin: 9 g/dL — ABNORMAL LOW (ref 13.0–17.0)
MCH: 31.4 pg (ref 26.0–34.0)
MCHC: 31.3 g/dL (ref 30.0–36.0)
MCV: 100.3 fL — ABNORMAL HIGH (ref 80.0–100.0)
NRBC: 0 % (ref 0.0–0.2)
Platelets: 170 10*3/uL (ref 150–400)
RBC: 2.87 MIL/uL — AB (ref 4.22–5.81)
RDW: 16.5 % — AB (ref 11.5–15.5)
WBC: 7.4 10*3/uL (ref 4.0–10.5)

## 2018-09-12 LAB — I-STAT TROPONIN, ED
TROPONIN I, POC: 0 ng/mL (ref 0.00–0.08)
Troponin i, poc: 0.02 ng/mL (ref 0.00–0.08)

## 2018-09-12 LAB — BRAIN NATRIURETIC PEPTIDE: B Natriuretic Peptide: 311.9 pg/mL — ABNORMAL HIGH (ref 0.0–100.0)

## 2018-09-12 LAB — LIPASE, BLOOD: LIPASE: 34 U/L (ref 11–51)

## 2018-09-12 MED ORDER — LIDOCAINE HCL 1 % IJ SOLN
INTRAMUSCULAR | Status: AC
  Filled 2018-09-12: qty 20

## 2018-09-12 MED ORDER — SODIUM CHLORIDE 0.9 % IV BOLUS
1000.0000 mL | Freq: Once | INTRAVENOUS | Status: AC
  Administered 2018-09-12: 1000 mL via INTRAVENOUS

## 2018-09-12 MED ORDER — MORPHINE SULFATE (PF) 4 MG/ML IV SOLN
4.0000 mg | Freq: Once | INTRAVENOUS | Status: AC
  Administered 2018-09-12: 4 mg via INTRAVENOUS
  Filled 2018-09-12: qty 1

## 2018-09-12 MED ORDER — OXYCODONE HCL 5 MG PO TABS: 5.0000 mg | ORAL_TABLET | Freq: Four times a day (QID) | ORAL | 0 refills | Status: DC | PRN

## 2018-09-12 NOTE — ED Notes (Signed)
Pt transported to [facility]

## 2018-09-12 NOTE — ED Triage Notes (Signed)
Pt presents with c/o chest pain that started last night. Pt reports that he had an EGD last week and was told to come here if he had any swelling or pain. Pt is now having chest pain and swelling in his legs.

## 2018-09-12 NOTE — Procedures (Signed)
Ultrasound-guided therapeutic paracentesis performed yielding 4.1  liters of yellow fluid. No immediate complications.

## 2018-09-12 NOTE — Telephone Encounter (Signed)
Patient is now been moved from his ex-wife's home to daughter and they are declining home health care.

## 2018-09-12 NOTE — ED Notes (Signed)
Majik Darr : [phone] (pt niece) will pick pt up if discharged. Please call if you need anything.
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
## 2018-09-12 NOTE — Discharge Instructions (Signed)
Continue your medications as previously prescribed.  Return to the emergency department if you experience any new and/or concerning symptoms.

## 2018-09-12 NOTE — ED Notes (Signed)
One gold saved tube in main lab

## 2018-09-20 ENCOUNTER — Ambulatory Visit (HOSPITAL_COMMUNITY)
Admission: RE | Admit: 2018-09-20 | Discharge: 2018-09-20 | Disposition: A | Discharge status: Home / Self Care | Payer: Medicare HMO | Source: Ambulatory Visit | Attending: Oncology | Admitting: Oncology

## 2018-09-20 ENCOUNTER — Telehealth: Payer: Self-pay

## 2018-09-20 ENCOUNTER — Telehealth: Payer: Self-pay | Admitting: Pharmacist

## 2018-09-20 ENCOUNTER — Inpatient Hospital Stay: Payer: Medicare HMO | Attending: Oncology | Admitting: Oncology

## 2018-09-20 VITALS — BP 139/87 | HR 75 | Temp 97.6°F | Resp 17 | Ht 66.0 in | Wt 139.4 lb

## 2018-09-20 DIAGNOSIS — K766 Portal hypertension: Secondary | ICD-10-CM | POA: Diagnosis not present

## 2018-09-20 DIAGNOSIS — Z87891 Personal history of nicotine dependence: Secondary | ICD-10-CM | POA: Insufficient documentation

## 2018-09-20 DIAGNOSIS — R188 Other ascites: Secondary | ICD-10-CM | POA: Diagnosis not present

## 2018-09-20 DIAGNOSIS — C220 Liver cell carcinoma: Secondary | ICD-10-CM

## 2018-09-20 MED ORDER — OXYCODONE HCL 5 MG PO TABS: 5.0000 mg | ORAL_TABLET | Freq: Two times a day (BID) | ORAL | 0 refills | Status: DC | PRN

## 2018-09-20 MED ORDER — LIDOCAINE HCL 1 % IJ SOLN
INTRAMUSCULAR | Status: AC
  Filled 2018-09-20: qty 20

## 2018-09-20 MED ORDER — SORAFENIB TOSYLATE 200 MG PO TABS: 200.0000 mg | ORAL_TABLET | Freq: Every day | ORAL | 0 refills | Status: DC

## 2018-09-20 NOTE — Telephone Encounter (Signed)
Oral Oncology Patient Advocate Encounter  Received notification from [facility] that prior authorization for Nexavar is required.  PA submitted on CoverMyMeds Key [id number] Status is pending  Oral Oncology Clinic will continue to follow.  Peter Nelson Phone [phone] Fax [phone]

## 2018-09-20 NOTE — Progress Notes (Signed)
[facility] OFFICE PROGRESS NOTE   Diagnosis: Hepatocellular carcinoma  INTERVAL HISTORY:   Peter Nelson returns as scheduled.  He was admitted with GI bleeding again on 09/05/2018.  An upper endoscopy 09/06/2018 nonbleeding superficial esophageal ulcers were found.  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  No stigmata recent bleeding.  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  Biopsy of a duodenal nodule revealed a Brunner gland nodule.  He was transfused with packed red blood cells on 09/06/2018.  He was discharged home on 09/07/2018.  He denies recurrent bleeding. Peter Nelson is here today with his sister and niece.  He complains of abdominal pain.  He has run out of oxycodone.  He underwent a paracentesis for 4.1 L of fluid on 09/12/2018.   Objective:  Vital signs in last 24 hours:  Blood pressure 139/87, pulse 75, temperature 97.6 F (36.4 C), temperature source Oral, resp. rate 17, height _0  (1.676 m), weight 139 lb 6.4 oz (63.2 kg), SpO2 100 %.    Resp: Lungs clear bilaterally Cardio: Regular rate and rhythm GI: Distended with ascites, no hepatomegaly Vascular: 1+ pitting edema at the lower leg bilaterally  Lab Results:  Lab Results  Component Value Date   WBC 7.4 09/12/2018   HGB 8.0 (L) 09/12/2018   HCT 24.6 (L) 09/12/2018   MCV 100.3 (H) 09/12/2018   PLT 170 09/12/2018   NEUTROABS 4.0 09/07/2018    CMP  Lab Results  Component Value Date   NA 136 09/12/2018   K 3.7 09/12/2018   CL 106 09/12/2018   CO2 23 09/12/2018   GLUCOSE 93 09/12/2018   BUN 15 09/12/2018   CREATININE 0.97 09/12/2018   CALCIUM 7.9 (L) 09/12/2018   PROT 6.4 (L) 09/07/2018   ALBUMIN 2.1 (L) 09/07/2018   AST 82 (H) 09/07/2018   ALT 35 09/07/2018   ALKPHOS 91 09/07/2018   BILITOT 2.3 (H) 09/07/2018   GFRNONAA >60 09/12/2018   GFRAA >60 09/12/2018     Medications: I have reviewed the patient's current  medications.   Assessment/Plan: 1. Hepatocellular carcinoma ? Ablation of right liver lesion in 2015 ? Markedly elevated AFP ? MRI 08/26/2018- multiple new hepatic lesions compatible with multifocal hepatocellular carcinoma, nonocclusive thrombus versus tumor thrombus in the proximal portal vein, large proximal gastric varices and borderline splenomegaly, ascites  2. Hepatitis C and alcohol-related cirrhosis 3. 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 History of tobacco and alcohol abuse-he reports discontinuing alcohol    Disposition: Peter Nelson has advanced stage hepatocellular carcinoma.  His case was presented at the GI tumor conference.  He is not a candidate for surgery or hepatic directed therapy. We discussed treatment options including supportive care and systemic therapy.  He is at increased risk for toxicity with systemic therapy in the setting of advanced cirrhosis and a history of GI bleeding.  He would like to try systemic therapy.  He understands no therapy will be curative.  We discussed lenvatinib and sorafenib therapy.  We decided to try sorafenib based on the decreased bleeding risk.  We discussed the chance for mucositis, rash, diarrhea, and hematologic toxicity.  He met with the Cancer center pharmacist today to discuss toxicities associated with sorafenib.  He agrees to proceed.  The plan is to begin sorafenib within the next week.  He will return for an office and lab visit in approximately 2 weeks.  25 minutes were spent with  the patient today.  The majority of the time was used for counseling and coordination of care.  Betsy Coder, MD  09/20/2018  9:11 AM

## 2018-09-20 NOTE — Telephone Encounter (Signed)
Oral Chemotherapy Pharmacist Encounter  Due to risk of bleeding associated with therapy with Lenvima and multiple recent hospital admissions due to bleeding varices, melena prescription will be canceled at this time. Patient will be evaluated to start therapy on Nexavar. Nexavar work-up will be documented in a separate encounter.  Johny Drilling, PharmD, BCPS, BCOP  09/20/2018   10:02 AM Oral Oncology Clinic [phone]

## 2018-09-20 NOTE — Telephone Encounter (Addendum)
Oral Oncology Pharmacist Encounter  Received new prescription for Nexavar (sorafenib) for the treatment of hepatocellular carcinoma secondary to hepatitis C/alcohol related cirrhosis, planned duration until disease progression or unacceptable toxicity.  Original diagnosis in 2015 and underwent ablation procedure at that time. Patient was admitted to the hospital in October 2019with hematemesis and found to have progression of hepatocellular carcinoma  Labs from Epic assessed, [place] for treatment initiation.  SCr=0.97, round to 1.0 for age > 54, est CrCl ~ 60 mL/min 09/07/18 Phosphorus = 2.5, will be monitored periodically during treatment 09/12/18 lipase = 34, will be monitored periodically during treatment No baseline amylase, will be monitored periodically during treatment  BPs reviewed, patient noted with hypertension, optimal BP control will be discussed with patient and BP will be monitored closely during therapy  Last TSH performed 03/21/2017 = 1.33, will be monitored periodically during therapy  09/13/2018 shows QTC prolonged at 485 msec recommend repeat after treatment initiation  Calculated Child Pugh score, patient is Child Class C based on bilirubin, albumin, and large volume ascites No dose adjustments per manufacturer for severe hepatic dysfunction as it has not been studied  A pharmacokinetic study performed in 2009 shows that Nexavar dose should be decreased to 200 mg once daily in severe hepatic dysfunction  Current medication list in Epic reviewed, DDI with Nexavar and pantoprazole identified:  Category C interaction: Proton pump inhibitors may decrease the absorption of sorafenib.  Manufacturer recommends to monitor for decreased sorafenib efficacy during coadministration with proton pump inhibitors.  Prescribing information states that solubility of sorafenib is pH dependent, however, coadministered ministration of omeprazole did not result in clinically significant  changes in sorafenib exposure and no dose adjustments are recommended in patients receiving con commitment therapy with acid suppressant agents.  Prescription has been E scribed to the [facility] for benefits analysis and approval.  Oral Oncology Clinic will continue to follow for insurance authorization, copayment issues, initial counseling and start date.  Johny Drilling, PharmD, BCPS, BCOP  09/20/2018 10:19 AM Oral Oncology Clinic [phone]

## 2018-09-20 NOTE — Telephone Encounter (Signed)
Printed avs and calender of upcoming appointment. Per 12/4

## 2018-09-20 NOTE — Telephone Encounter (Signed)
Oral Oncology Pharmacist Encounter  Received new referral for Lenvima (lenvatinib) for the treatment of hepatocellular carcinoma secondary to hepatitis C/alcohol related cirrhosis, planned duration until disease progression or unacceptable toxicity.  Original diagnosis in 2015 and underwent ablation procedure at that time. Patient was admitted to the hospital in October 2019 with hematemesis and found to have progression of hepatocellular carcinoma  Labs from Epic assessed, [place] for treatment initiation. BPs reviewed, patient noted with hypertension, optimal BP control will be discussed with patient and BP will be monitored closely during therapy  Last TSH performed 03/21/2017 = 1.33, will be monitored periodically during therapy  Last urinalysis performed 05/04/2017, urine protein 2+, will be monitored closely during therapy  09/13/2018 shows QTC prolonged at 485 msec recommend repeat after treatment initiation  Calculated Child Pugh score, patient is Child Class C based on bilirubin, albumin, and large volume ascites No dose adjustments per manufacturer for Lenvima and hepatocellular carcinoma for severe renal dysfunction  Wt= 63.2kg, target dose 12 mg once daily for actual body weight >/= 60 kg, or 8 mg once daily for actual body weight < 60kg, target dose will be discussed with MD  Current medication list in Epic reviewed, no DDIs with Lenivima identified.  Prescription will be sent to appropriate specialty pharmacy for dispensing once insurance authorization is obtained.  Oral Oncology Clinic will continue to follow for insurance authorization, copayment issues, initial counseling and start date.  Johny Drilling, PharmD, BCPS, BCOP  09/20/2018 9:38 AM Oral Oncology Clinic [phone]

## 2018-09-20 NOTE — Telephone Encounter (Signed)
Oral Oncology Patient Advocate Encounter  Prior Authorization for Nexavar has been approved.    PA# [id number] Effective dates: 09/20/18 through 03/22/19  Oral Oncology Clinic will continue to follow.   [place] Patient [place] Phone [phone] Fax [phone]

## 2018-09-20 NOTE — Telephone Encounter (Signed)
Oral Chemotherapy Pharmacist Encounter   I spoke with patient, sister, and niece, and exam room for overview of: Nexavar (sorafenib) for the treatment of metastatic hepatocellular carcinoma, planned duration until disease progression or unacceptable toxicity.   Counseled patient on administration, dosing, side effects, monitoring, drug-food interactions, safe handling, storage, and disposal.  Nexavar will be initiated on a dose titration schedule with planned target dose of 241m BID.  Patient will take Nexavar 2027mtablets, 1 tablet (2003mby mouth once daily on an empty stomach, 1 hour before or 2 hours after meals for the 1st month. Patient states he will take his Nexavar at bedtime.  If tolerated, patient will increase to dose to Nexavar 200m33mblets, 1 tablets (200mg48m mouth 2 times daily on an empty stomach, onward.  Patient will separate Nexavar dosing by 10-12 hours each day once taking twice daily.  Nexavar start date: TBD, pending medication acquisition  Adverse effects include but are not limited to: hypertension, fatigue, hand-foot syndrome, skin rash, diarrhea, nausea, anorexia, lab abnormalities, cardiac conduction changes, hypothyroidism, and wound healing complications.     Patient will obtain anti diarrheal and alert the office of 4 or more loose stools above baseline.  Reviewed with patient importance of keeping a medication schedule and plan for any missed doses.  Mr. PenniHollared understanding and appreciation.   All questions answered. Medication reconciliation performed and medication/allergy list updated.  Insurance authorization has been approved. Test claim at the pharmacy reveals copayment $3.80. Oral oncology patient advocate will reach out to patient and family for medication acquisition coordination.  Patient knows to call the office with questions or concerns. Oral Oncology Clinic will continue to follow.  JesseJohny DrillingrmD, BCPS, BCOP   09/20/2018   10:33 AM Oral Oncology Clinic [phone]

## 2018-09-20 NOTE — Procedures (Addendum)
Ultrasound-guided  therapeutic paracentesis performed yielding 5 liters (maximum ordered) of clear, yellow fluid. No immediate complications. EBL< 1cc.

## 2018-09-21 ENCOUNTER — Telehealth: Payer: Self-pay | Admitting: *Deleted

## 2018-09-21 ENCOUNTER — Other Ambulatory Visit: Payer: Self-pay | Admitting: *Deleted

## 2018-09-21 MED ORDER — ONDANSETRON HCL 8 MG PO TABS: 8.0000 mg | ORAL_TABLET | Freq: Two times a day (BID) | ORAL | 0 refills | Status: AC | PRN

## 2018-09-21 MED FILL — NexAVAR 200 MG TABS: 200 | 30 days supply | Qty: 30 | Fill #0

## 2018-09-21 NOTE — Telephone Encounter (Signed)
Oral Oncology Patient Advocate Encounter  Confirmed with [facility] that Nexavar was shipped on 09/21/18 to deliver 09/22/18 with a $3.80 copay.  [place] Patient [place] Phone [phone] Fax [phone]

## 2018-09-21 NOTE — Telephone Encounter (Signed)
Prior auth approved for xifaxan 550 mg from [facility] through 10/18/2019.

## 2018-09-22 ENCOUNTER — Ambulatory Visit: Payer: Medicare HMO | Admitting: Family Medicine

## 2018-09-23 ENCOUNTER — Other Ambulatory Visit: Payer: Self-pay

## 2018-09-23 ENCOUNTER — Emergency Department (HOSPITAL_COMMUNITY)
Admission: EM | Admit: 2018-09-23 | Discharge: 2018-09-23 | Disposition: A | Discharge status: Home / Self Care | Payer: Medicare HMO | Attending: Emergency Medicine | Admitting: Emergency Medicine

## 2018-09-23 ENCOUNTER — Encounter (HOSPITAL_COMMUNITY): Payer: Self-pay

## 2018-09-23 DIAGNOSIS — I10 Essential (primary) hypertension: Secondary | ICD-10-CM | POA: Insufficient documentation

## 2018-09-23 DIAGNOSIS — F17210 Nicotine dependence, cigarettes, uncomplicated: Secondary | ICD-10-CM | POA: Diagnosis not present

## 2018-09-23 DIAGNOSIS — Z79899 Other long term (current) drug therapy: Secondary | ICD-10-CM | POA: Diagnosis not present

## 2018-09-23 DIAGNOSIS — J45909 Unspecified asthma, uncomplicated: Secondary | ICD-10-CM | POA: Insufficient documentation

## 2018-09-23 DIAGNOSIS — Z9889 Other specified postprocedural states: Secondary | ICD-10-CM | POA: Diagnosis not present

## 2018-09-23 NOTE — Discharge Instructions (Addendum)
Apply a dressing to the wound.  Lay flat for the rest the day.  Return here for fever, bloody drainage, or severe leakage
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
## 2018-09-23 NOTE — ED Triage Notes (Signed)
Pt arrives POV from home. Pt has liver cancer and had A parencentisis on Wednesday. Per family the puncture site has been draining clear fluid since but has had an increase in drainage today. Pt denies abd pain. Family called cancer center who advised pt to come to ED.

## 2018-09-25 ENCOUNTER — Telehealth: Payer: Self-pay | Admitting: Oncology

## 2018-09-25 NOTE — Telephone Encounter (Signed)
[facility] PAL 12/17 - moved appointments to 12/16. Left message. Schedule mailed.

## 2018-09-30 ENCOUNTER — Emergency Department (HOSPITAL_COMMUNITY): Payer: Medicare HMO

## 2018-09-30 ENCOUNTER — Inpatient Hospital Stay (HOSPITAL_COMMUNITY)
Admission: EM | Admit: 2018-09-30 | Discharge: 2018-10-04 | DRG: 871 | Disposition: A | Discharge status: Home / Self Care | Payer: Medicare HMO | Attending: Family Medicine | Admitting: Family Medicine

## 2018-09-30 ENCOUNTER — Encounter (HOSPITAL_COMMUNITY): Payer: Self-pay | Admitting: *Deleted

## 2018-09-30 ENCOUNTER — Other Ambulatory Visit: Payer: Self-pay

## 2018-09-30 DIAGNOSIS — I8511 Secondary esophageal varices with bleeding: Secondary | ICD-10-CM | POA: Diagnosis not present

## 2018-09-30 DIAGNOSIS — D6959 Other secondary thrombocytopenia: Secondary | ICD-10-CM | POA: Diagnosis present

## 2018-09-30 DIAGNOSIS — R627 Adult failure to thrive: Secondary | ICD-10-CM | POA: Diagnosis present

## 2018-09-30 DIAGNOSIS — I8510 Secondary esophageal varices without bleeding: Secondary | ICD-10-CM | POA: Diagnosis present

## 2018-09-30 DIAGNOSIS — A419 Sepsis, unspecified organism: Secondary | ICD-10-CM | POA: Diagnosis present

## 2018-09-30 DIAGNOSIS — E872 Acidosis, unspecified: Secondary | ICD-10-CM

## 2018-09-30 DIAGNOSIS — K7030 Alcoholic cirrhosis of liver without ascites: Secondary | ICD-10-CM

## 2018-09-30 DIAGNOSIS — K269 Duodenal ulcer, unspecified as acute or chronic, without hemorrhage or perforation: Secondary | ICD-10-CM | POA: Diagnosis present

## 2018-09-30 DIAGNOSIS — K7469 Other cirrhosis of liver: Secondary | ICD-10-CM

## 2018-09-30 DIAGNOSIS — Z515 Encounter for palliative care: Secondary | ICD-10-CM

## 2018-09-30 DIAGNOSIS — Z7189 Other specified counseling: Secondary | ICD-10-CM | POA: Diagnosis not present

## 2018-09-30 DIAGNOSIS — F17210 Nicotine dependence, cigarettes, uncomplicated: Secondary | ICD-10-CM | POA: Diagnosis present

## 2018-09-30 DIAGNOSIS — C220 Liver cell carcinoma: Secondary | ICD-10-CM | POA: Diagnosis not present

## 2018-09-30 DIAGNOSIS — K3189 Other diseases of stomach and duodenum: Secondary | ICD-10-CM | POA: Diagnosis present

## 2018-09-30 DIAGNOSIS — R109 Unspecified abdominal pain: Secondary | ICD-10-CM

## 2018-09-30 DIAGNOSIS — K7460 Unspecified cirrhosis of liver: Secondary | ICD-10-CM

## 2018-09-30 DIAGNOSIS — R1084 Generalized abdominal pain: Secondary | ICD-10-CM | POA: Diagnosis not present

## 2018-09-30 DIAGNOSIS — F17200 Nicotine dependence, unspecified, uncomplicated: Secondary | ICD-10-CM | POA: Diagnosis present

## 2018-09-30 DIAGNOSIS — K5190 Ulcerative colitis, unspecified, without complications: Secondary | ICD-10-CM | POA: Diagnosis present

## 2018-09-30 DIAGNOSIS — R197 Diarrhea, unspecified: Secondary | ICD-10-CM | POA: Diagnosis present

## 2018-09-30 DIAGNOSIS — A4102 Sepsis due to Methicillin resistant Staphylococcus aureus: Secondary | ICD-10-CM | POA: Diagnosis not present

## 2018-09-30 DIAGNOSIS — D849 Immunodeficiency, unspecified: Secondary | ICD-10-CM | POA: Diagnosis not present

## 2018-09-30 DIAGNOSIS — Z886 Allergy status to analgesic agent status: Secondary | ICD-10-CM

## 2018-09-30 DIAGNOSIS — R188 Other ascites: Secondary | ICD-10-CM | POA: Diagnosis not present

## 2018-09-30 DIAGNOSIS — Z8659 Personal history of other mental and behavioral disorders: Secondary | ICD-10-CM

## 2018-09-30 DIAGNOSIS — K652 Spontaneous bacterial peritonitis: Secondary | ICD-10-CM | POA: Diagnosis present

## 2018-09-30 DIAGNOSIS — Z6821 Body mass index (BMI) 21.0-21.9, adult: Secondary | ICD-10-CM

## 2018-09-30 DIAGNOSIS — D509 Iron deficiency anemia, unspecified: Secondary | ICD-10-CM | POA: Diagnosis present

## 2018-09-30 DIAGNOSIS — N400 Enlarged prostate without lower urinary tract symptoms: Secondary | ICD-10-CM | POA: Diagnosis present

## 2018-09-30 DIAGNOSIS — K766 Portal hypertension: Secondary | ICD-10-CM | POA: Diagnosis not present

## 2018-09-30 DIAGNOSIS — R10817 Generalized abdominal tenderness: Secondary | ICD-10-CM | POA: Diagnosis not present

## 2018-09-30 DIAGNOSIS — E876 Hypokalemia: Secondary | ICD-10-CM | POA: Diagnosis present

## 2018-09-30 DIAGNOSIS — N179 Acute kidney failure, unspecified: Secondary | ICD-10-CM | POA: Diagnosis not present

## 2018-09-30 DIAGNOSIS — I10 Essential (primary) hypertension: Secondary | ICD-10-CM | POA: Diagnosis present

## 2018-09-30 DIAGNOSIS — R6520 Severe sepsis without septic shock: Secondary | ICD-10-CM | POA: Diagnosis not present

## 2018-09-30 DIAGNOSIS — B182 Chronic viral hepatitis C: Secondary | ICD-10-CM | POA: Diagnosis not present

## 2018-09-30 DIAGNOSIS — J9811 Atelectasis: Secondary | ICD-10-CM | POA: Diagnosis not present

## 2018-09-30 DIAGNOSIS — J45909 Unspecified asthma, uncomplicated: Secondary | ICD-10-CM | POA: Diagnosis present

## 2018-09-30 DIAGNOSIS — R945 Abnormal results of liver function studies: Secondary | ICD-10-CM | POA: Diagnosis present

## 2018-09-30 DIAGNOSIS — D649 Anemia, unspecified: Secondary | ICD-10-CM | POA: Diagnosis present

## 2018-09-30 DIAGNOSIS — E43 Unspecified severe protein-calorie malnutrition: Secondary | ICD-10-CM | POA: Diagnosis not present

## 2018-09-30 DIAGNOSIS — Z79899 Other long term (current) drug therapy: Secondary | ICD-10-CM

## 2018-09-30 DIAGNOSIS — Z66 Do not resuscitate: Secondary | ICD-10-CM | POA: Diagnosis present

## 2018-09-30 DIAGNOSIS — K7031 Alcoholic cirrhosis of liver with ascites: Secondary | ICD-10-CM | POA: Diagnosis not present

## 2018-09-30 DIAGNOSIS — K7290 Hepatic failure, unspecified without coma: Secondary | ICD-10-CM | POA: Diagnosis not present

## 2018-09-30 DIAGNOSIS — Z8719 Personal history of other diseases of the digestive system: Secondary | ICD-10-CM

## 2018-09-30 DIAGNOSIS — A021 Salmonella sepsis: Secondary | ICD-10-CM | POA: Diagnosis not present

## 2018-09-30 DIAGNOSIS — K2210 Ulcer of esophagus without bleeding: Secondary | ICD-10-CM | POA: Diagnosis not present

## 2018-09-30 LAB — URINALYSIS, ROUTINE W REFLEX MICROSCOPIC
Glucose, UA: NEGATIVE mg/dL
Hgb urine dipstick: NEGATIVE
Ketones, ur: NEGATIVE mg/dL
Leukocytes, UA: NEGATIVE
Nitrite: NEGATIVE
Protein, ur: NEGATIVE mg/dL
Specific Gravity, Urine: 1.025 (ref 1.005–1.030)
pH: 5 (ref 5.0–8.0)

## 2018-09-30 LAB — CBC WITH DIFFERENTIAL/PLATELET
Abs Immature Granulocytes: 0.3 10*3/uL — ABNORMAL HIGH (ref 0.00–0.07)
BASOS ABS: 0 10*3/uL (ref 0.0–0.1)
Band Neutrophils: 15 %
Basophils Relative: 0 %
EOS ABS: 0 10*3/uL (ref 0.0–0.5)
EOS PCT: 0 %
HEMATOCRIT: 32.3 % — AB (ref 39.0–52.0)
Hemoglobin: 10.2 g/dL — ABNORMAL LOW (ref 13.0–17.0)
LYMPHS ABS: 0.6 10*3/uL — AB (ref 0.7–4.0)
Lymphocytes Relative: 7 %
MCH: 29.7 pg (ref 26.0–34.0)
MCHC: 31.6 g/dL (ref 30.0–36.0)
MCV: 93.9 fL (ref 80.0–100.0)
MONO ABS: 0 10*3/uL — AB (ref 0.1–1.0)
Metamyelocytes Relative: 4 %
Monocytes Relative: 0 %
NEUTROS PCT: 74 %
NRBC: 0 % (ref 0.0–0.2)
Neutro Abs: 7.1 10*3/uL (ref 1.7–7.7)
Platelets: 156 10*3/uL (ref 150–400)
RBC: 3.44 MIL/uL — ABNORMAL LOW (ref 4.22–5.81)
RDW: 16.6 % — AB (ref 11.5–15.5)
WBC: 8 10*3/uL (ref 4.0–10.5)

## 2018-09-30 LAB — COMPREHENSIVE METABOLIC PANEL
ALT: 39 U/L (ref 0–44)
AST: 85 U/L — ABNORMAL HIGH (ref 15–41)
Albumin: 2 g/dL — ABNORMAL LOW (ref 3.5–5.0)
Alkaline Phosphatase: 124 U/L (ref 38–126)
Anion gap: 8 (ref 5–15)
BUN: 19 mg/dL (ref 8–23)
CHLORIDE: 103 mmol/L (ref 98–111)
CO2: 20 mmol/L — ABNORMAL LOW (ref 22–32)
CREATININE: 1.18 mg/dL (ref 0.61–1.24)
Calcium: 7.7 mg/dL — ABNORMAL LOW (ref 8.9–10.3)
Glucose, Bld: 76 mg/dL (ref 70–99)
POTASSIUM: 4.2 mmol/L (ref 3.5–5.1)
Sodium: 131 mmol/L — ABNORMAL LOW (ref 135–145)
TOTAL PROTEIN: 7.3 g/dL (ref 6.5–8.1)
Total Bilirubin: 6.1 mg/dL — ABNORMAL HIGH (ref 0.3–1.2)

## 2018-09-30 LAB — GLUCOSE, PLEURAL OR PERITONEAL FLUID: Glucose, Fluid: 39 mg/dL

## 2018-09-30 LAB — PROTIME-INR
INR: 1.95
Prothrombin Time: 22 seconds — ABNORMAL HIGH (ref 11.4–15.2)

## 2018-09-30 LAB — BODY FLUID CELL COUNT WITH DIFFERENTIAL
Eos, Fluid: 0 %
Lymphs, Fluid: 4 %
MONOCYTE-MACROPHAGE-SEROUS FLUID: 0 % — AB (ref 50–90)
Neutrophil Count, Fluid: 96 % — ABNORMAL HIGH (ref 0–25)
Total Nucleated Cell Count, Fluid: 9033 cu mm — ABNORMAL HIGH (ref 0–1000)

## 2018-09-30 LAB — MRSA PCR SCREENING: MRSA by PCR: POSITIVE — AB

## 2018-09-30 LAB — LACTATE DEHYDROGENASE, PLEURAL OR PERITONEAL FLUID: LD FL: 124 U/L — AB (ref 3–23)

## 2018-09-30 LAB — ALBUMIN, PLEURAL OR PERITONEAL FLUID: Albumin, Fluid: <1 g/dL

## 2018-09-30 LAB — I-STAT CG4 LACTIC ACID, ED: LACTIC ACID, VENOUS: 3.02 mmol/L — AB (ref 0.5–1.9)

## 2018-09-30 LAB — PROTEIN, PLEURAL OR PERITONEAL FLUID: Total protein, fluid: <3 g/dL

## 2018-09-30 LAB — LACTIC ACID, PLASMA: Lactic Acid, Venous: 3.5 mmol/L (ref 0.5–1.9)

## 2018-09-30 MED ORDER — SUCRALFATE 1 GM/10ML PO SUSP
1.0000 g | Freq: Three times a day (TID) | ORAL | Status: DC
  Administered 2018-09-30 – 2018-10-04 (×17): 1 g via ORAL
  Filled 2018-09-30 (×17): qty 10

## 2018-09-30 MED ORDER — SORAFENIB TOSYLATE 200 MG PO TABS: 200.0000 mg | ORAL_TABLET | Freq: Every day | ORAL | Status: DC

## 2018-09-30 MED ORDER — POLYETHYLENE GLYCOL 3350 17 G PO PACK: 17.0000 g | PACK | Freq: Every day | ORAL | Status: DC | PRN

## 2018-09-30 MED ORDER — IBUPROFEN 200 MG PO TABS
400.0000 mg | ORAL_TABLET | Freq: Once | ORAL | Status: AC
  Administered 2018-09-30: 400 mg via ORAL
  Filled 2018-09-30: qty 2

## 2018-09-30 MED ORDER — SODIUM CHLORIDE 0.9 % IV SOLN
2.0000 g | Freq: Once | INTRAVENOUS | Status: AC
  Administered 2018-09-30: 2 g via INTRAVENOUS
  Filled 2018-09-30: qty 2

## 2018-09-30 MED ORDER — VITAMIN C 500 MG PO TABS
500.0000 mg | ORAL_TABLET | Freq: Every day | ORAL | Status: DC
  Administered 2018-09-30 – 2018-10-03 (×4): 500 mg via ORAL
  Filled 2018-09-30 (×5): qty 1

## 2018-09-30 MED ORDER — ONDANSETRON HCL 4 MG/2ML IJ SOLN: 4.0000 mg | Freq: Four times a day (QID) | INTRAMUSCULAR | Status: DC | PRN

## 2018-09-30 MED ORDER — VITAMIN E 180 MG (400 UNIT) PO CAPS
400.0000 [IU] | ORAL_CAPSULE | Freq: Every day | ORAL | Status: DC
  Administered 2018-10-01 – 2018-10-02 (×2): 400 [IU] via ORAL
  Filled 2018-09-30 (×5): qty 1

## 2018-09-30 MED ORDER — SODIUM CHLORIDE 0.9 % IV SOLN
1.0000 g | Freq: Two times a day (BID) | INTRAVENOUS | Status: DC
  Administered 2018-09-30 – 2018-10-02 (×4): 1 g via INTRAVENOUS
  Filled 2018-09-30 (×5): qty 1

## 2018-09-30 MED ORDER — NADOLOL 20 MG PO TABS
20.0000 mg | ORAL_TABLET | Freq: Every day | ORAL | Status: DC
  Administered 2018-09-30 – 2018-10-03 (×4): 20 mg via ORAL
  Filled 2018-09-30 (×5): qty 1

## 2018-09-30 MED ORDER — VANCOMYCIN HCL IN DEXTROSE 1-5 GM/200ML-% IV SOLN: 1000.0000 mg | Freq: Once | INTRAVENOUS | Status: DC

## 2018-09-30 MED ORDER — CHLORHEXIDINE GLUCONATE CLOTH 2 % EX PADS
6.0000 | MEDICATED_PAD | Freq: Every day | CUTANEOUS | Status: DC
  Administered 2018-10-02 – 2018-10-04 (×3): 6 via TOPICAL

## 2018-09-30 MED ORDER — VITAMIN B-1 100 MG PO TABS
100.0000 mg | ORAL_TABLET | Freq: Every day | ORAL | Status: DC
  Administered 2018-09-30 – 2018-10-03 (×4): 100 mg via ORAL
  Filled 2018-09-30 (×5): qty 1

## 2018-09-30 MED ORDER — MUPIROCIN 2 % EX OINT
1.0000 "application " | TOPICAL_OINTMENT | Freq: Two times a day (BID) | CUTANEOUS | Status: DC
  Administered 2018-09-30 – 2018-10-03 (×8): 1 via NASAL
  Filled 2018-09-30: qty 22

## 2018-09-30 MED ORDER — METOCLOPRAMIDE HCL 10 MG PO TABS
10.0000 mg | ORAL_TABLET | Freq: Three times a day (TID) | ORAL | Status: DC
  Administered 2018-09-30 – 2018-10-04 (×12): 10 mg via ORAL
  Filled 2018-09-30 (×13): qty 1

## 2018-09-30 MED ORDER — OXYCODONE HCL 5 MG PO TABS
5.0000 mg | ORAL_TABLET | ORAL | Status: DC | PRN
  Administered 2018-09-30 – 2018-10-04 (×8): 5 mg via ORAL
  Filled 2018-09-30 (×10): qty 1

## 2018-09-30 MED ORDER — VANCOMYCIN HCL 10 G IV SOLR
1500.0000 mg | Freq: Once | INTRAVENOUS | Status: AC
  Administered 2018-09-30: 1500 mg via INTRAVENOUS
  Filled 2018-09-30: qty 1500

## 2018-09-30 MED ORDER — FERROUS SULFATE 325 (65 FE) MG PO TABS
325.0000 mg | ORAL_TABLET | Freq: Every day | ORAL | Status: DC
  Administered 2018-10-01 – 2018-10-03 (×3): 325 mg via ORAL
  Filled 2018-09-30 (×4): qty 1

## 2018-09-30 MED ORDER — ONDANSETRON HCL 4 MG PO TABS: 4.0000 mg | ORAL_TABLET | Freq: Four times a day (QID) | ORAL | Status: DC | PRN

## 2018-09-30 MED ORDER — SPIRONOLACTONE 25 MG PO TABS
50.0000 mg | ORAL_TABLET | Freq: Every day | ORAL | Status: DC
  Administered 2018-09-30 – 2018-10-03 (×4): 50 mg via ORAL
  Filled 2018-09-30 (×5): qty 2

## 2018-09-30 MED ORDER — METRONIDAZOLE IN NACL 5-0.79 MG/ML-% IV SOLN
500.0000 mg | Freq: Three times a day (TID) | INTRAVENOUS | Status: DC
  Administered 2018-09-30 (×2): 500 mg via INTRAVENOUS
  Filled 2018-09-30 (×2): qty 100

## 2018-09-30 MED ORDER — PANTOPRAZOLE SODIUM 40 MG PO TBEC
40.0000 mg | DELAYED_RELEASE_TABLET | Freq: Every day | ORAL | Status: DC
  Administered 2018-09-30 – 2018-10-04 (×5): 40 mg via ORAL
  Filled 2018-09-30 (×5): qty 1

## 2018-09-30 MED ORDER — ALBUMIN HUMAN 25 % IV SOLN
50.0000 g | Freq: Once | INTRAVENOUS | Status: AC
  Administered 2018-10-01: 50 g via INTRAVENOUS
  Filled 2018-09-30: qty 200

## 2018-09-30 MED ORDER — LIDOCAINE HCL 1 % IJ SOLN
10.0000 mL | Freq: Once | INTRAMUSCULAR | Status: AC
  Administered 2018-09-30: 10 mL
  Filled 2018-09-30: qty 20

## 2018-09-30 MED ORDER — VITAMIN B-6 100 MG PO TABS
100.0000 mg | ORAL_TABLET | Freq: Every day | ORAL | Status: DC
  Administered 2018-09-30 – 2018-10-03 (×4): 100 mg via ORAL
  Filled 2018-09-30 (×5): qty 1

## 2018-09-30 MED ORDER — SODIUM CHLORIDE 0.9 % IV BOLUS
500.0000 mL | Freq: Once | INTRAVENOUS | Status: AC
  Administered 2018-09-30: 500 mL via INTRAVENOUS

## 2018-09-30 MED ORDER — VANCOMYCIN HCL IN DEXTROSE 1-5 GM/200ML-% IV SOLN: 1000.0000 mg | INTRAVENOUS | Status: DC

## 2018-09-30 MED ORDER — NICOTINE 14 MG/24HR TD PT24
14.0000 mg | MEDICATED_PATCH | Freq: Every day | TRANSDERMAL | Status: DC
  Filled 2018-09-30 (×3): qty 1

## 2018-09-30 NOTE — ED Notes (Signed)
ED TO INPATIENT HANDOFF REPORT  Name/Age/Gender Peter Nelson 67 y.o. male  Code Status Code Status History    Date Active Date Inactive Code Status Order ID Comments User Context   09/05/2018 1955 09/07/2018 1603 Full Code [id number]  Vianne Bulls, MD ED   08/22/2018 2342 08/26/2018 1834 Full Code [id number]  Shela Leff, MD ED   08/07/2018 2112 08/10/2018 2212 Full Code [id number]  Rise Patience, MD ED   01/11/2017 1456 01/12/2017 2103 Full Code [id number]  Caren Griffins, MD Inpatient   12/29/2016 2014 01/01/2017 1525 Full Code [id number]  Vianne Bulls, MD ED   10/25/2016 2347 10/30/2016 1942 Full Code [id number]  Rise Patience, MD ED   10/25/2016 2249 10/25/2016 2346 Full Code [id number]  Varney Biles, MD ED   03/15/2016 2043 03/16/2016 1451 Full Code [id number]  Etta Quill, DO ED    Advance Directive Documentation     Most Recent Value  Type of Advance Directive  Healthcare Power of Attorney, Living will  Pre-existing out of facility DNR order (yellow form or pink MOST form)  -  "MOST" Form in Place?  -      Home/SNF/Other Home  Chief Complaint Fever/Abdominal Pain  Level of Care/Admitting Diagnosis ED Disposition    ED Disposition Condition [place]: [facility] [[id number]]  Level of Care: Telemetry [5]  Admit to tele based on following criteria: Monitor for Ischemic changes  Diagnosis: Sepsis [facility]) [[id number]]  Admitting Physician: Louellen Molder [phone]  Attending Physician: Louellen Molder [phone]  Estimated length of stay: past midnight tomorrow  Certification:: I certify this patient will need inpatient services for at least 2 midnights  PT Class (Do Not Modify): Inpatient [101]  PT Acc Code (Do Not Modify): Private [1]       Medical History Past Medical History:  Diagnosis Date  . Alcohol abuse   . Allergy   . Arthritis   . Asthma   . Cancer ([place])    liver  . Cirrhosis ([place])   . Dysrhythmia    . Esophageal varices ([place])   . Hepatitis C   . Hepatocellular carcinoma ([place])   . Hyperplastic colon polyp   . Hypertension   . Internal hemorrhoids   . Mallory-Weiss tear   . Shortness of breath dyspnea    with activity and anxiety  . Ulcerative colitis ([place])   . Wears glasses     Allergies Allergies  Allergen Reactions  . Aspirin Other (See Comments)    Does not take because of Hep C     IV Location/Drains/Wounds Patient Lines/Drains/Airways Status   Active Line/Drains/Airways    Name:   Placement date:   Placement time:   Site:   Days:   Peripheral IV 09/30/18 Left Forearm   09/30/18    0419    Forearm   less than 1   Peripheral IV 09/30/18 Left Wrist   09/30/18    0545    Wrist   less than 1          Labs/Imaging Results for orders placed or performed during the hospital encounter of 09/30/18 (from the past 48 hour(s))  Comprehensive metabolic panel     Status: Abnormal   Collection Time: 09/30/18  5:54 AM  Result Value Ref Range   Sodium 131 (L) 135 - 145 mmol/L   Potassium 4.2 3.5 - 5.1 mmol/L   Chloride 103 98 - 111 mmol/L   CO2 20 (  L) 22 - 32 mmol/L   Glucose, Bld 76 70 - 99 mg/dL   BUN 19 8 - 23 mg/dL   Creatinine, Ser 1.18 0.61 - 1.24 mg/dL   Calcium 7.7 (L) 8.9 - 10.3 mg/dL   Total Protein 7.3 6.5 - 8.1 g/dL   Albumin 2.0 (L) 3.5 - 5.0 g/dL   AST 85 (H) 15 - 41 U/L   ALT 39 0 - 44 U/L   Alkaline Phosphatase 124 38 - 126 U/L   Total Bilirubin 6.1 (H) 0.3 - 1.2 mg/dL   GFR calc non Af Amer >60 >60 mL/min   GFR calc Af Amer >60 >60 mL/min   Anion gap 8 5 - 15    Comment: Performed at [facility], [place] [address] [place], [place] [address]  CBC with Differential     Status: Abnormal   Collection Time: 09/30/18  5:54 AM  Result Value Ref Range   WBC 8.0 4.0 - 10.5 K/uL   RBC 3.44 (L) 4.22 - 5.81 MIL/uL   Hemoglobin 10.2 (L) 13.0 - 17.0 g/dL   HCT 32.3 (L) 39.0 - 52.0 %   MCV 93.9 80.0 - 100.0 fL   MCH 29.7 26.0 - 34.0 pg    MCHC 31.6 30.0 - 36.0 g/dL   RDW 16.6 (H) 11.5 - 15.5 %   Platelets 156 150 - 400 K/uL   nRBC 0.0 0.0 - 0.2 %   Neutrophils Relative % 74 %   Neutro Abs 7.1 1.7 - 7.7 K/uL   Band Neutrophils 15 %   Lymphocytes Relative 7 %   Lymphs Abs 0.6 (L) 0.7 - 4.0 K/uL   Monocytes Relative 0 %   Monocytes Absolute 0.0 (L) 0.1 - 1.0 K/uL   Eosinophils Relative 0 %   Eosinophils Absolute 0.0 0.0 - 0.5 K/uL   Basophils Relative 0 %   Basophils Absolute 0.0 0.0 - 0.1 K/uL   WBC Morphology MILD LEFT SHIFT (1-5% METAS, OCC MYELO, OCC BANDS)    Metamyelocytes Relative 4 %   Abs Immature Granulocytes 0.30 (H) 0.00 - 0.07 K/uL   Dohle Bodies PRESENT    Polychromasia PRESENT     Comment: Performed at [facility], [place] [address], [place], [place] [address]  Protime-INR     Status: Abnormal   Collection Time: 09/30/18  5:54 AM  Result Value Ref Range   Prothrombin Time 22.0 (H) 11.4 - 15.2 seconds   INR 1.95     Comment: Performed at [facility], [place] [address] [place], [place] [address]  I-Stat CG4 Lactic Acid, ED     Status: Abnormal   Collection Time: 09/30/18  6:10 AM  Result Value Ref Range   Lactic Acid, Venous 3.02 (HH) 0.5 - 1.9 mmol/L   Comment NOTIFIED PHYSICIAN   Urinalysis, Routine w reflex microscopic     Status: Abnormal   Collection Time: 09/30/18  7:26 AM  Result Value Ref Range   Color, Urine Charlis Harner (A) YELLOW    Comment: BIOCHEMICALS MAY BE AFFECTED BY COLOR   APPearance CLEAR CLEAR   Specific Gravity, Urine 1.025 1.005 - 1.030   pH 5.0 5.0 - 8.0   Glucose, UA NEGATIVE NEGATIVE mg/dL   Hgb urine dipstick NEGATIVE NEGATIVE   Bilirubin Urine SMALL (A) NEGATIVE   Ketones, ur NEGATIVE NEGATIVE mg/dL   Protein, ur NEGATIVE NEGATIVE mg/dL   Nitrite NEGATIVE NEGATIVE   Leukocytes, UA NEGATIVE NEGATIVE    Comment: Performed at [facility]  [place] [address], [place], [place] [address]   Dg Chest 2 View  Result Date:  09/30/2018 CLINICAL DATA:  Rule out sepsis.  Fever. EXAM: CHEST - 2 VIEW COMPARISON:  Radiographs 09/12/2018 FINDINGS: The cardiomediastinal contours are normal. Lung volumes are low. Improved bibasilar atelectasis with probable scarring at the right lung base. Pulmonary vasculature is normal. No consolidation, pleural effusion, or pneumothorax. No acute osseous abnormalities are seen. IMPRESSION: Low lung volumes. No confluent airspace disease to suggest pneumonia. Improved bibasilar atelectasis since last month with residual atelectasis versus scarring at the right lung base. Electronically Signed   By: Keith Rake M.D.   On: 09/30/2018 05:54    Pending Labs Unresulted Labs (From admission, onward)    Start     Ordered   09/30/18 0818  Lactate dehydrogenase (pleural or peritoneal fluid)  (Peritoneal fluid analysis panel (pnl))  ONCE - STAT,   STAT     09/30/18 0817   09/30/18 0818  Glucose, pleural or peritoneal fluid  (Peritoneal fluid analysis panel (pnl))  ONCE - STAT,   STAT     09/30/18 0817   09/30/18 0818  Protein, pleural or peritoneal fluid  (Peritoneal fluid analysis panel (pnl))  ONCE - STAT,   STAT     09/30/18 0817   09/30/18 0818  Albumin, pleural or peritoneal fluid  (Peritoneal fluid analysis panel (pnl))  ONCE - STAT,   STAT     09/30/18 0817   09/30/18 0818  Body fluid culture  (Peritoneal fluid analysis panel (pnl))  ONCE - STAT,   STAT    Question Answer Comment  Are there also cytology or pathology orders on this specimen? No   Patient immune status Immunocompromised      09/30/18 0817   09/30/18 0818  Body fluid cell count with differential  Once,   R    Question:  Are there also cytology or pathology orders on this specimen?  Answer:  No   09/30/18 0817   09/30/18 0431  Culture, blood (Routine x 2)  BLOOD CULTURE X 2,   STAT     09/30/18 0431   09/30/18 0431  Urine culture  ONCE - STAT,   STAT     09/30/18 0431          Vitals/Pain Today's Vitals    09/30/18 0630 09/30/18 0727 09/30/18 0800 09/30/18 0900  BP: 131/80 131/80 123/74 114/79  Pulse: 90 88 86 86  Resp: 20 (!) 22 (!) 21 19  Temp:      TempSrc:      SpO2: 98% 100% 96% 98%  Weight:  63 kg    Height:      PainSc:        Isolation Precautions No active isolations  Medications Medications  metroNIDAZOLE (FLAGYL) IVPB 500 mg (500 mg Intravenous New Bag/Given 09/30/18 0750)  vancomycin (VANCOCIN) 1,500 mg in sodium chloride 0.9 % 500 mL IVPB (1,500 mg Intravenous New Bag/Given 09/30/18 0753)  sodium chloride 0.9 % bolus 500 mL (0 mLs Intravenous Stopped 09/30/18 0749)  ceFEPIme (MAXIPIME) 2 g in sodium chloride 0.9 % 100 mL IVPB (0 g Intravenous Stopped 09/30/18 0749)  lidocaine (XYLOCAINE) 1 % (with pres) injection 10 mL (10 mLs Infiltration Given 09/30/18 0756)  ibuprofen (ADVIL,MOTRIN) tablet 400 mg (400 mg Oral Given 09/30/18 0756)    Mobility walks 6

## 2018-09-30 NOTE — ED Notes (Signed)
Provider consulted regarding pt's fever control.  Awaiting return of labs to determine appropriate medication.  Labs sent.

## 2018-09-30 NOTE — Progress Notes (Signed)
Pharmacy: Sorafenib  Patient's a 67 y.o M with metastatic liver cancer on  sorafenib PTA, presented to the ED on 09/30/2018 with c/o fever and abd pain.  He's currently on abx for suspected sepsis.  Inpatient Sorafenib (Nexavar) hold criteria:  Acute coronary syndrome  Gastrointestinal perforation  Hand / foot syndrome - Grade 2 or higher  Hemorrhage  Surgery planned this admission  Active infection   Plan: - hold sorafenib for now per hospital policy  Dia Sitter, PharmD, BCPS 09/30/2018 10:06 AM
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
## 2018-09-30 NOTE — ED Notes (Signed)
Bed: WA09 Expected date:  Expected time:  Means of arrival:  Comments: EMS 67 yo male from home/liver cancer-distention/recent chemo fever 102 135/86

## 2018-09-30 NOTE — Progress Notes (Signed)
A consult was received from an ED physician for cefepime and vancomycin per pharmacy dosing.  The patient's profile has been reviewed for ht/wt/allergies/indication/available labs.   A one time order has been placed for Cefepime 2 Gm and Vancomycin 1500 mg.  Further antibiotics/pharmacy consults should be ordered by admitting physician if indicated.                       Thank you, Dorrene German 09/30/2018  6:27 AM
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
## 2018-09-30 NOTE — Progress Notes (Signed)
Pharmacy Antibiotic Note  Peter Nelson is a 67 y.o. male with metastatic liver cancer on sorafenib PTA, presented to the ED on 09/30/2018 with c/o fever and abd pain.  Patient received vancomycin, cefepime and flagyl in the ED.  To continue cefepime and flagyl inpatient for sepsis.   Today, 09/30/2018: - Tmax 101.7, wbc wnl - scr 1.18 (crcl~52) -  LA 3.02  09/30/18 5:18 PM  MD added vancomycin  Plan: - cefepime 1gm IV q12h - flagyl 500 mg IV q8h per MD  Vancomycin 1000 mg iv q24h. AUC 525 TBW/TBW.  _______________________________  Height: 5' 6"  (167.6 cm) Weight: 134 lb 11.2 oz (61.1 kg) IBW/kg (Calculated) : 63.8  Temp (24hrs), Avg:99.5 F (37.5 C), Min:97.8 F (36.6 C), Max:101.7 F (38.7 C)  Recent Labs  Lab 09/30/18 0554 09/30/18 0610 09/30/18 1625  WBC 8.0  --   --   CREATININE 1.18  --   --   LATICACIDVEN  --  3.02* 3.5*    Estimated Creatinine Clearance: 52.5 mL/min (by C-G formula based on SCr of 1.18 mg/dL).    Allergies  Allergen Reactions  . Aspirin Other (See Comments)    Does not take because of Hep C      Thank you for allowing pharmacy to be a part of this patient's care.  Ulice Dash D 09/30/2018 5:17 PM

## 2018-09-30 NOTE — Progress Notes (Signed)
Dr. Clementeen Graham made aware of LA of 3.5 via text page - will place needed orders

## 2018-09-30 NOTE — Progress Notes (Signed)
MRSA swab positive - MD made aware and protocol inititiated

## 2018-09-30 NOTE — ED Triage Notes (Signed)
Pt recently started chemo pill which he should take everyday 3 days ago.

## 2018-09-30 NOTE — ED Triage Notes (Signed)
Pt reports from home with abdominal pain, distension.  Pt had paracentesis recently.  Hx liver cancer, hep C.  Pt febrile with EMS, slightly tachycardic.  Pt alert, oriented.  Jaundiced.  Abdomen distended, tender to palpation.

## 2018-09-30 NOTE — Progress Notes (Signed)
Pharmacy Antibiotic Note  Peter Nelson is a 67 y.o. male with metastatic liver cancer on sorafenib PTA, presented to the ED on 09/30/2018 with c/o fever and abd pain.  Patient received vancomycin, cefepime and flagyl in the ED.  To continue cefepime and flagyl inpatient for sepsis.  Today, 09/30/2018: - Tmax 101.7, wbc wnl - scr 1.18 (crcl~52) -  LA 3.02   Plan: - cefepime 1gm IV q12h - flagyl 500 mg IV q8h per MD  _______________________________  Height: 5' 6"  (167.6 cm) Weight: 134 lb 11.2 oz (61.1 kg) IBW/kg (Calculated) : 63.8  Temp (24hrs), Avg:100.1 F (37.8 C), Min:98.2 F (36.8 C), Max:101.7 F (38.7 C)  Recent Labs  Lab 09/30/18 0554 09/30/18 0610  WBC 8.0  --   CREATININE 1.18  --   LATICACIDVEN  --  3.02*    Estimated Creatinine Clearance: 52.5 mL/min (by C-G formula based on SCr of 1.18 mg/dL).    Allergies  Allergen Reactions  . Aspirin Other (See Comments)    Does not take because of Hep C      Thank you for allowing pharmacy to be a part of this patient's care.  Lynelle Doctor 09/30/2018 9:54 AM

## 2018-10-01 ENCOUNTER — Inpatient Hospital Stay (HOSPITAL_COMMUNITY): Payer: Medicare HMO

## 2018-10-01 DIAGNOSIS — Z7189 Other specified counseling: Secondary | ICD-10-CM

## 2018-10-01 DIAGNOSIS — A021 Salmonella sepsis: Secondary | ICD-10-CM

## 2018-10-01 DIAGNOSIS — R109 Unspecified abdominal pain: Secondary | ICD-10-CM

## 2018-10-01 DIAGNOSIS — Z515 Encounter for palliative care: Secondary | ICD-10-CM

## 2018-10-01 DIAGNOSIS — K7469 Other cirrhosis of liver: Secondary | ICD-10-CM

## 2018-10-01 LAB — URINE CULTURE: Culture: NO GROWTH

## 2018-10-01 LAB — COMPREHENSIVE METABOLIC PANEL
ALT: 33 U/L (ref 0–44)
AST: 80 U/L — ABNORMAL HIGH (ref 15–41)
Albumin: 1.7 g/dL — ABNORMAL LOW (ref 3.5–5.0)
Alkaline Phosphatase: 99 U/L (ref 38–126)
Anion gap: 6 (ref 5–15)
BUN: 34 mg/dL — ABNORMAL HIGH (ref 8–23)
CO2: 18 mmol/L — ABNORMAL LOW (ref 22–32)
Calcium: 7.6 mg/dL — ABNORMAL LOW (ref 8.9–10.3)
Chloride: 108 mmol/L (ref 98–111)
Creatinine, Ser: 1.54 mg/dL — ABNORMAL HIGH (ref 0.61–1.24)
GFR calc Af Amer: 53 mL/min — ABNORMAL LOW (ref >60)
GFR calc non Af Amer: 46 mL/min — ABNORMAL LOW (ref >60)
GLUCOSE: 92 mg/dL (ref 70–99)
Potassium: 4.7 mmol/L (ref 3.5–5.1)
Sodium: 132 mmol/L — ABNORMAL LOW (ref 135–145)
TOTAL PROTEIN: 6.3 g/dL — AB (ref 6.5–8.1)
Total Bilirubin: 4.3 mg/dL — ABNORMAL HIGH (ref 0.3–1.2)

## 2018-10-01 LAB — CBC
HCT: 29.2 % — ABNORMAL LOW (ref 39.0–52.0)
Hemoglobin: 9.4 g/dL — ABNORMAL LOW (ref 13.0–17.0)
MCH: 30.6 pg (ref 26.0–34.0)
MCHC: 32.2 g/dL (ref 30.0–36.0)
MCV: 95.1 fL (ref 80.0–100.0)
Platelets: 138 10*3/uL — ABNORMAL LOW (ref 150–400)
RBC: 3.07 MIL/uL — ABNORMAL LOW (ref 4.22–5.81)
RDW: 16.6 % — ABNORMAL HIGH (ref 11.5–15.5)
WBC: 9 10*3/uL (ref 4.0–10.5)
nRBC: 0.2 % (ref 0.0–0.2)

## 2018-10-01 LAB — PROTIME-INR
INR: 2.37
Prothrombin Time: 25.6 seconds — ABNORMAL HIGH (ref 11.4–15.2)

## 2018-10-01 MED ORDER — VANCOMYCIN HCL IN DEXTROSE 750-5 MG/150ML-% IV SOLN
750.0000 mg | INTRAVENOUS | Status: DC
  Administered 2018-10-01 – 2018-10-04 (×4): 750 mg via INTRAVENOUS
  Filled 2018-10-01 (×4): qty 150

## 2018-10-01 MED ORDER — LIDOCAINE HCL 1 % IJ SOLN
INTRAMUSCULAR | Status: AC
  Filled 2018-10-01: qty 10

## 2018-10-01 NOTE — Progress Notes (Signed)
CRITICAL VALUE ALERT  Critical Value:  Peritoneal fluid cx - + staph aureus  Date & Time Notied:  10/01/18 @ 1250  Provider Notified: Osei-bonsu  Orders Received/Actions taken: none

## 2018-10-01 NOTE — Progress Notes (Signed)
[facility]                                                                              Patient Demographics  Peter Nelson, is a 67 y.o. male, DOB - 03-03-1951, TKP:[id number]  Admit date - 09/30/2018   Admitting Physician No admitting provider for patient encounter.  Outpatient Primary MD for the patient is Peter Nelson, [place]  Outpatient specialists:   LOS - 1  days    Chief Complaint  Patient presents with  . Fever  . Abdominal Pain       Brief summary   [name] a10 y.o.male,with medical history significant for but not limited to alcoholic liver cirrhosis with decompensation, hep C with hepatocellular carcinoma, recent hospitalizations x2 past month with upper GI bleed secondary to bleeding esophageal varices requiring banding who was recently started on sorafenib by his oncologist (10 days back) presenting with abdominal pain of 2 days duration associated with fever and elevated LFTs.  Patient is admitted to the hospital for sepsis of unclear etiology to rule out SBP     Assessment & Plan    Principal Problem:   Sepsis ([place]) Active Problems:   Tobacco use disorder   Chronic hepatitis C without hepatic coma (HCC)   Hepatocellular carcinoma (HCC)   Esophageal varices in alcoholic cirrhosis (HCC)   BPH (benign prostatic hyperplasia)   Symptomatic anemia   Decompensated liver disease (HCC)    Severe sepsis  ([place]) Etiology unclear.   Given abdominal pain and distention with ascites and recent bleeding esophageal varices, SBP is of high concern. Diagnostic paracentesis done in the ED -follow-up results of fluid analysis Continue IV antibiotics May need CT of the abdomen and pelvis for further evaluation if not better Supportive care      Decompensated liver disease with bleeding esophageal varices (HCC) Combination of alcoholic liver disease and hep C with hepatocellular carcinoma.   Recent hospitalization for GI bleed with  esophageal varices that was banded about 5 weeks back.   He was rehospitalized with melena and hematemesis 3 weeks ago and again underwent EGD showing many superficial nonbleeding esophageal ulcer and nonbleeding esophageal varices, portal hypertensive gastropathy and nonbleeding duodenal erosions.  GI commended for twice daily PPI and Carafate. Patient denies any hematemesis or melena at this time.   Continue propanolol and Aldactone.  Total bilirubin has elevated. Monitor hematologic indicis and LFTs Consider repeat therapeutic paracentesis as needed   Hepatocellular carcinoma /Chronic hepatitis C without hepatic coma  Being followed by Dr. Learta Nelson - deemed not surgical candidate candidate - on sorafenib. Hold sorafenib for now in the setting of sepsis.  Patient reported having diarrhea 2 days ago but currently subsided.     Esophageal varices in alcoholic cirrhosis (HCC) Stable  continue twice daily PPI and Carafate  Abdominal pain On chronic oxycodone at home which is continued.  Iron deficiency anemia Continue supplement.  Tobacco use disorder Counseled strongly on cessation.  Nicotine patch applied   Code Status: Full code DVT Prophylaxis:  SCD's Family Communication: Discussed in detail with the patient, all imaging results, lab results explained to the patient  Disposition Plan: Home  Time Spent in minutes  33 minutes  Procedures:  Abdominal paracentesis by ED physician  Consultants:     Antimicrobials:   Vancomycin and cefepime   Medications  Scheduled Meds: . Chlorhexidine Gluconate Cloth  6 each Topical Q0600  . ferrous sulfate  325 mg Oral Q breakfast  . lidocaine      . metoCLOPramide  10 mg Oral TID AC  . mupirocin ointment  1 application Nasal BID  . nadolol  20 mg Oral Daily  . nicotine  14 mg Transdermal Daily  . pantoprazole  40 mg Oral Daily  . pyridOXINE  100 mg Oral Daily  . spironolactone  50 mg Oral Daily  . sucralfate  1 g Oral  TID WC & HS  . thiamine  100 mg Oral Daily  . vitamin C  500 mg Oral Daily  . vitamin E  400 Units Oral Daily   Continuous Infusions: . ceFEPime (MAXIPIME) IV 1 g (10/01/18 0523)  . vancomycin     PRN Meds:.ondansetron **OR** ondansetron (ZOFRAN) IV, oxyCODONE, polyethylene glycol   Antibiotics   Anti-infectives (From admission, onward)   Start     Dose/Rate Route Frequency Ordered Stop   10/01/18 1000  vancomycin (VANCOCIN) IVPB 750 mg/150 ml premix     750 mg 150 mL/hr over 60 Minutes Intravenous Every 24 hours 10/01/18 0914     10/01/18 0800  vancomycin (VANCOCIN) IVPB 1000 mg/200 mL premix  Status:  Discontinued     1,000 mg 200 mL/hr over 60 Minutes Intravenous Every 24 hours 09/30/18 1717 10/01/18 0914   09/30/18 1800  ceFEPIme (MAXIPIME) 1 g in sodium chloride 0.9 % 100 mL IVPB     1 g 200 mL/hr over 30 Minutes Intravenous Every 12 hours 09/30/18 1003     09/30/18 0630  ceFEPIme (MAXIPIME) 2 g in sodium chloride 0.9 % 100 mL IVPB     2 g 200 mL/hr over 30 Minutes Intravenous  Once 09/30/18 0619 09/30/18 0749   09/30/18 0630  metroNIDAZOLE (FLAGYL) IVPB 500 mg  Status:  Discontinued     500 mg 100 mL/hr over 60 Minutes Intravenous Every 8 hours 09/30/18 0619 09/30/18 1704   09/30/18 0630  vancomycin (VANCOCIN) IVPB 1000 mg/200 mL premix  Status:  Discontinued     1,000 mg 200 mL/hr over 60 Minutes Intravenous  Once 09/30/18 0619 09/30/18 0627   09/30/18 0630  vancomycin (VANCOCIN) 1,500 mg in sodium chloride 0.9 % 500 mL IVPB     1,500 mg 250 mL/hr over 120 Minutes Intravenous  Once 09/30/18 4193 09/30/18 7902        Subjective:   Peter Nelson was seen and examined today.  No fever chills.  Abdominal pain better.  No acute events overnight.    Objective:   Vitals:   10/01/18 0825 10/01/18 0830 10/01/18 0840 10/01/18 0851  BP: 105/67 119/76 111/74 110/73  Pulse:      Resp:      Temp:      TempSrc:      SpO2:      Weight:      Height:         Intake/Output Summary (Last 24 hours) at 10/01/2018 1235 Last data filed at 10/01/2018 0600 Gross per 24 hour  Intake 823.99 ml  Output -  Net 823.99 ml     Wt Readings from Last 3 Encounters:  09/30/18 61.1 kg  09/23/18 63 kg  09/20/18 63.2 kg     Exam  General:  NAD chronically ill-appearing, cachectic  HEENT: NCAT,  PERRL,MMM  Neck: SUPPLE, (-) JVD  Cardiovascular: RRR, (-) GALLOP, (-) MURMUR  Respiratory: CTA  Gastrointestinal: SOFT, (+) DISTENSION, BS(+), (+) mild diffuse TENDERNESS, no rebound tenderness  Ext: (-) CYANOSIS, (-) EDEMA  Neuro: A, OX 3  Skin:(-) RASH  Psych:NORMAL AFFECT/MOOD   Data Reviewed:  I have personally reviewed following labs and imaging studies  Micro Results Recent Results (from the past 240 hour(s))  Urine culture     Status: None   Collection Time: 09/30/18  7:26 AM  Result Value Ref Range Status   Specimen Description   Final    URINE, RANDOM Performed at [facility] [address], [place], [place] [address]    Special Requests   Final    NONE Performed at [facility], [place] [address] [place], [place] [address]    Culture   Final    NO GROWTH Performed at [facility], [place] [address], [place], [place] [address]    Report Status 10/01/2018 FINAL  Final  Body fluid culture     Status: None (Preliminary result)   Collection Time: 09/30/18  8:18 AM  Result Value Ref Range Status   Specimen Description   Final    PERITONEAL CAVITY Performed at [facility] [address], [place], [place] [address]    Special Requests   Final    Immunocompromised Performed at [facility], [place] [address], [place], [place] [address]    Gram Stain   Final    ABUNDANT WBC PRESENT, PREDOMINANTLY PMN NO ORGANISMS SEEN    Culture   Final    FEW STAPHYLOCOCCUS AUREUS CRITICAL RESULT CALLED TO, READ BACK BY AND VERIFIED WITH: Jerrye Noble RN, AT  1123 10/01/18 BY D. VANHOOK REGARDING CULTURE GROWTH Performed at [facility], [place] [address] [place], [place] [address]    Report Status PENDING  Incomplete  MRSA PCR Screening     Status: Abnormal   Collection Time: 09/30/18 12:12 PM  Result Value Ref Range Status   MRSA by PCR POSITIVE (A) NEGATIVE Final    Comment: RESULT CALLED TO, READ BACK BY AND VERIFIED WITH: BULLINS,H RN [phone] COVINGTON,N        The GeneXpert MRSA Assay (FDA approved for NASAL specimens only), is one component of a comprehensive MRSA colonization surveillance program. It is not intended to diagnose MRSA infection nor to guide or monitor treatment for MRSA infections. Performed at [facility], [place] [address], [place], [place] [address]     Radiology Reports Dg Chest 2 View  Result Date: 09/30/2018 CLINICAL DATA:  Rule out sepsis.  Fever. EXAM: CHEST - 2 VIEW COMPARISON:  Radiographs 09/12/2018 FINDINGS: The cardiomediastinal contours are normal. Lung volumes are low. Improved bibasilar atelectasis with probable scarring at the right lung base. Pulmonary vasculature is normal. No consolidation, pleural effusion, or pneumothorax. No acute osseous abnormalities are seen. IMPRESSION: Low lung volumes. No confluent airspace disease to suggest pneumonia. Improved bibasilar atelectasis since last month with residual atelectasis versus scarring at the right lung base. Electronically Signed   By: Keith Rake M.D.   On: 09/30/2018 05:54   Dg Chest 2 View  Result Date: 09/12/2018 CLINICAL DATA:  Chest pain and peripheral edema since endoscopy last week. History of liver malignancy and recent GI bleed. No cough, shortness of breath, or chest congestion. Current smoker. EXAM: CHEST - 2 VIEW COMPARISON:  Chest x-ray of August 07, 2018 FINDINGS: The lungs are adequately inflated. The interstitial markings are mildly increased. The heart is top-normal in size. The pulmonary  vascularity is not clearly engorged. There is calcification in the wall of the aortic arch. The bony thorax exhibits no acute abnormality. IMPRESSION: Coarse lung markings at both bases suggests subsegmental atelectasis. No definite pneumonia nor pulmonary edema. Thoracic aortic atherosclerosis. Electronically Signed   By: David  Martinique M.D.   On: 09/12/2018 10:00   US Paracentesis  Result Date: 10/01/2018 INDICATION: Patient with history of hepatocellular carcinoma, cirrhosis, hepatitis-C, recurrent ascites. Request made for therapeutic paracentesis. EXAM: ULTRASOUND GUIDED THERAPEUTIC PARACENTESIS MEDICATIONS: None COMPLICATIONS: None immediate. PROCEDURE: Informed written consent was obtained from the patient after a discussion of the risks, benefits and alternatives to treatment. A timeout was performed prior to the initiation of the procedure. Initial ultrasound scanning demonstrates a moderate to large amount of ascites within the right mid to lower abdominal quadrant. The right mid to lower abdomen was prepped and draped in the usual sterile fashion. 1% lidocaine was used for local anesthesia. Following this, a 19 gauge, 7-cm, Yueh catheter was introduced. An ultrasound image was saved for documentation purposes. The paracentesis was performed. The catheter was removed and a dressing was applied. The patient tolerated the procedure well without immediate post procedural complication. FINDINGS: A total of approximately 4.6 liters of hazy, yellow fluid was removed. IMPRESSION: Successful ultrasound-guided therapeutic paracentesis yielding 4.6 liters liters of peritoneal fluid. Read by: Rowe Robert, PA-C Electronically Signed   By: Sandi Mariscal M.D.   On: 10/01/2018 11:22   US Paracentesis  Result Date: 09/20/2018 INDICATION: Patient with history of hepatocellular carcinoma, esophageal varices with prior banding, cirrhosis, hepatitis-C, recurrent ascites. Request made for therapeutic paracentesis up to  5 liters. EXAM: ULTRASOUND GUIDED THERAPEUTIC PARACENTESIS MEDICATIONS: None COMPLICATIONS: None immediate. PROCEDURE: Informed written consent was obtained from the patient after a discussion of the risks, benefits and alternatives to treatment. A timeout was performed prior to the initiation of the procedure. Initial ultrasound scanning demonstrates a large amount of ascites within the left mid to lower abdominal quadrant. The left mid to lower abdomen was prepped and draped in the usual sterile fashion. 1% lidocaine was used for local anesthesia. Following this, a 19 gauge, 7-cm, Yueh catheter was introduced. An ultrasound image was saved for documentation purposes. The paracentesis was performed. The catheter was removed and a dressing was applied. The patient tolerated the procedure well without immediate post procedural complication. FINDINGS: A total of approximately 5 liters of clear, yellow fluid was removed. IMPRESSION: Successful ultrasound-guided therapeutic paracentesis yielding 5 liters of peritoneal fluid. Read by: Rowe Robert, PA-C Electronically Signed   By: Jerilynn Mages.  Shick M.D.   On: 09/20/2018 16:55   US Paracentesis  Result Date: 09/12/2018 INDICATION: Patient with history of hepatocellular carcinoma, esophageal varices with prior banding, cirrhosis, hepatitis-C, recurrent ascites. Request made for therapeutic paracentesis. EXAM: ULTRASOUND GUIDED THERAPEUTIC PARACENTESIS MEDICATIONS: None COMPLICATIONS: None immediate. PROCEDURE: Informed written consent was obtained from the patient after a discussion of the risks, benefits and alternatives to treatment. A timeout was performed prior to the initiation of the procedure. Initial ultrasound scanning demonstrates a amount of ascites within the right upper to mid abdominal quadrant. The right upper to mid abdomen was prepped and draped in the usual sterile fashion. 1% lidocaine was used for local anesthesia. Following this, a 19 gauge, 7-cm, Yueh  catheter was introduced. An ultrasound image was saved for documentation purposes.  The paracentesis was performed. The catheter was removed and a dressing was applied. The patient tolerated the procedure well without immediate post procedural complication. FINDINGS: A total of approximately 4.1 liters of yellow fluid was removed. IMPRESSION: Successful ultrasound-guided therapeutic paracentesis yielding 4.1 liters of peritoneal fluid. Read by: Rowe Robert, PA-C Electronically Signed   By: Marybelle Killings M.D.   On: 09/12/2018 14:07    Lab Data:  CBC: Recent Labs  Lab 09/30/18 0554 10/01/18 0617  WBC 8.0 9.0  NEUTROABS 7.1  --   HGB 10.2* 9.4*  HCT 32.3* 29.2*  MCV 93.9 95.1  PLT 156 959*   Basic Metabolic Panel: Recent Labs  Lab 09/30/18 0554 10/01/18 0617  NA 131* 132*  K 4.2 4.7  CL 103 108  CO2 20* 18*  GLUCOSE 76 92  BUN 19 34*  CREATININE 1.18 1.54*  CALCIUM 7.7* 7.6*   GFR: Estimated Creatinine Clearance: 40.2 mL/min (A) (by C-G formula based on SCr of 1.54 mg/dL (H)). Liver Function Tests: Recent Labs  Lab 09/30/18 0554 10/01/18 0617  AST 85* 80*  ALT 39 33  ALKPHOS 124 99  BILITOT 6.1* 4.3*  PROT 7.3 6.3*  ALBUMIN 2.0* 1.7*   No results for input(s): LIPASE, AMYLASE in the last 168 hours. No results for input(s): AMMONIA in the last 168 hours. Coagulation Profile: Recent Labs  Lab 09/30/18 0554 10/01/18 0617  INR 1.95 2.37   Cardiac Enzymes: No results for input(s): CKTOTAL, CKMB, CKMBINDEX, TROPONINI in the last 168 hours. BNP (last 3 results) No results for input(s): PROBNP in the last 8760 hours. HbA1C: No results for input(s): HGBA1C in the last 72 hours. CBG: No results for input(s): GLUCAP in the last 168 hours. Lipid Profile: No results for input(s): CHOL, HDL, LDLCALC, TRIG, CHOLHDL, LDLDIRECT in the last 72 hours. Thyroid Function Tests: No results for input(s): TSH, T4TOTAL, FREET4, T3FREE, THYROIDAB in the last 72 hours. Anemia  Panel: No results for input(s): VITAMINB12, FOLATE, FERRITIN, TIBC, IRON, RETICCTPCT in the last 72 hours. Urine analysis:    Component Value Date/Time   COLORURINE AMBER (A) 09/30/2018 0726   APPEARANCEUR CLEAR 09/30/2018 0726   LABSPEC 1.025 09/30/2018 0726   PHURINE 5.0 09/30/2018 0726   GLUCOSEU NEGATIVE 09/30/2018 0726   HGBUR NEGATIVE 09/30/2018 0726   BILIRUBINUR SMALL (A) 09/30/2018 0726   KETONESUR NEGATIVE 09/30/2018 0726   PROTEINUR NEGATIVE 09/30/2018 0726   UROBILINOGEN >=8.0 05/04/2017 0914   NITRITE NEGATIVE 09/30/2018 0726   LEUKOCYTESUR NEGATIVE 09/30/2018 0726     Benito Mccreedy M.D. [facility] 10/01/2018, 12:35 PM  Pager: [phone] Between 7am to 7pm - call Pager - [phone]  After 7pm go to [email or web address] - password [email or web address]  Call night coverage person covering after 7pm

## 2018-10-01 NOTE — Procedures (Signed)
Ultrasound-guided therapeutic paracentesis performed yielding 4.6 liters of hazy, yellow fluid. No immediate complications. EBL none.

## 2018-10-01 NOTE — Consult Note (Signed)
Consultation Note Date: 10/01/2018   Patient Name: Peter Nelson  DOB: Jul 06, 1951  MRN: [id number]  Age / Sex: 67 y.o., male  PCP: Lanae Boast, [place] Referring Physician: Benito Mccreedy, MD  Reason for Consultation: Establishing goals of care  HPI/Patient Profile: 67 y.o. male  admitted on 09/30/2018   Clinical Assessment and Goals of Care:  Peter Nelson  is a 67 y.o. male, with history of alcoholic liver cirrhosis with decompensation, hep C with hepatocellular carcinoma, recent hospitalizations x2 past month with upper GI bleed secondary to bleeding esophageal varices requiring banding who was recently started on sorafenib by his oncologist (10 days back) presented to the hospital with abdominal pain. He has been admitted with sepsis, decompensated liver disease, esophageal varices, he has life limiting illness of hepatocellular carcinoma.    A palliative consult has been requested for goals of care discussions.   The patient is resting in bed, with covers drawn over his head. He is able to awaken easily. He states that he has just returned from his procedure. The patient underwent abdominal para centesis, 4.6L ascitic fluid was drained. He is on broad spectrum antibiotics.   I introduced myself and palliative care as follows:  Palliative medicine is specialized medical care for people living with serious illness. It focuses on providing relief from the symptoms and stress of a serious illness. The goal is to improve quality of life for both the patient and the family.  The patient states that he would like to rest, he has just returned from his procedure he states that his sister Joaquim Lai is his power of attorney.   Call placed and discussed with sister Joaquim Lai. I discussed with her about the patient's current condition. Patient reportedly has made a living will, sister Joaquim Lai was of the opinion that  the patient's wishes might be to "not be hooked up to nothing", but she isn't sure about whether or not it would be appropriate to establish DNR DNI. We will need to discuss further, directly with the patient, when is more able to participate in his decision making.    HCPOA  patient elects his sister Joaquim Lai [phone] as his HCPOA agent.    SUMMARY OF RECOMMENDATIONS    1. Ongoing discussions with patient and his sister Joaquim Lai regarding consideration for DNR DNI.  2. Recommend med onc follow up, currently Sorafenib on hold.  3. Continue current mode of care, prognosis appears to be guarded, the patient's serum creatinine is worsening, his lactic acid is 3.5 and his serum albumin is 1.7 grams/dL. PMT to continue to follow along.   Code Status/Advance Care Planning:  Full code    Symptom Management:    continue current mode of care.   Palliative Prophylaxis:   Delirium Protocol  Additional Recommendations (Limitations, Scope, Preferences):  Full Scope Treatment  Psycho-social/Spiritual:   Desire for further Chaplaincy support:yes  Additional Recommendations: Caregiving  Support/Resources  Prognosis:   Guarded   Discharge Planning: To Be Determined      Primary Diagnoses:  Present on Admission: . Sepsis ([place]) . Tobacco use disorder . Symptomatic anemia . Hepatocellular carcinoma ([place]) . Esophageal varices in alcoholic cirrhosis ([place]) . BPH (benign prostatic hyperplasia) . Chronic hepatitis C without hepatic coma ([place])   I have reviewed the medical record, interviewed the patient and family, and examined the patient. The following aspects are pertinent.  Past Medical History:  Diagnosis Date  . Alcohol abuse   . Allergy   . Arthritis   . Asthma   . Cancer ([place])    liver  . Cirrhosis ([place])   . Dysrhythmia   . Esophageal varices ([place])   . Hepatitis C   . Hepatocellular carcinoma ([place])   . Hyperplastic colon polyp   . Hypertension   . Internal  hemorrhoids   . Mallory-Weiss tear   . Shortness of breath dyspnea    with activity and anxiety  . Ulcerative colitis ([place])   . Wears glasses    Social History   Socioeconomic History  . Marital status: Widowed    Spouse name: Not on file  . Number of children: 60  . Years of education: Not on file  . Highest education level: Not on file  Occupational History  . Not on file  Social Needs  . Financial resource strain: Not on file  . Food insecurity:    Worry: Not on file    Inability: Not on file  . Transportation needs:    Medical: Not on file    Non-medical: Not on file  Tobacco Use  . Smoking status: Current Every Day Smoker    Packs/day: 0.25    Years: 55.00    Pack years: 13.75    Types: Cigarettes  . Smokeless tobacco: Never Used  . Tobacco comment: cutting back  Substance and Sexual Activity  . Alcohol use: Not Currently    Alcohol/week: 0.0 standard drinks    Frequency: Never    Comment: "a 1/2 pint a day" for more than 30 years, average 1 pint a day, cut back lately   . Drug use: No  . Sexual activity: Yes    Partners: Female  Lifestyle  . Physical activity:    Days per week: Not on file    Minutes per session: Not on file  . Stress: Not on file  Relationships  . Social connections:    Talks on phone: Not on file    Gets together: Not on file    Attends religious service: Not on file    Active member of club or organization: Not on file    Attends meetings of clubs or organizations: Not on file    Relationship status: Not on file  Other Topics Concern  . Not on file  Social History Narrative   Married Dec 22nd, 2016 to [place]   Has #13 children   Family History  Problem Relation Age of Onset  . Hypertension Mother   . Diabetes Mother   . Heart disease Mother   . Alzheimer's disease Mother   . Diabetes Sister   . Diabetes Father   . Cancer Brother        Liver  . Colon cancer Neg Hx   . Esophageal cancer Neg Hx   . Stomach cancer Neg Hx     . Rectal cancer Neg Hx    Scheduled Meds: . Chlorhexidine Gluconate Cloth  6 each Topical Q0600  . ferrous sulfate  325 mg Oral Q breakfast  . lidocaine      .  metoCLOPramide  10 mg Oral TID AC  . mupirocin ointment  1 application Nasal BID  . nadolol  20 mg Oral Daily  . nicotine  14 mg Transdermal Daily  . pantoprazole  40 mg Oral Daily  . pyridOXINE  100 mg Oral Daily  . spironolactone  50 mg Oral Daily  . sucralfate  1 g Oral TID WC & HS  . thiamine  100 mg Oral Daily  . vitamin C  500 mg Oral Daily  . vitamin E  400 Units Oral Daily   Continuous Infusions: . ceFEPime (MAXIPIME) IV 1 g (10/01/18 0523)  . vancomycin 750 mg (10/01/18 1301)   PRN Meds:.ondansetron **OR** ondansetron (ZOFRAN) IV, oxyCODONE, polyethylene glycol Medications Prior to Admission:  Prior to Admission medications   Medication Sig Start Date End Date Taking? Authorizing Provider  Ferrous Sulfate 27 MG TABS Take 27 mg by mouth daily.   Yes [provider]  metoCLOPramide (REGLAN) 10 MG tablet Take 10 mg by mouth 3 (three) times daily before meals.    Yes [provider]  nadolol (CORGARD) 20 MG tablet Take 1 tablet (20 mg total) by mouth daily. 09/05/18  Yes Pyrtle, Lajuan Lines, MD  ondansetron (ZOFRAN) 8 MG tablet Take 1 tablet (8 mg total) by mouth 2 (two) times daily as needed for nausea or vomiting. 09/21/18  Yes Ladell Pier, MD  oxyCODONE (OXY IR/ROXICODONE) 5 MG immediate release tablet Take 1 tablet (5 mg total) by mouth 2 (two) times daily as needed for severe pain. 09/20/18  Yes Ladell Pier, MD  pantoprazole (PROTONIX) 40 MG tablet Take 1 tablet (40 mg total) by mouth 2 (two) times daily for 14 days, THEN 1 tablet (40 mg total) daily. 09/07/18 10/21/18 Yes Sheikh, Omair Latif, DO  polyethylene glycol (MIRALAX / GLYCOLAX) packet Take 17 g by mouth daily. Patient taking differently: Take 17 g by mouth daily as needed for mild constipation.  08/27/18  Yes Regalado, Belkys A, MD   pyridOXINE (VITAMIN B-6) 100 MG tablet Take 100 mg by mouth daily.   Yes [provider]  SORAfenib (NEXAVAR) 200 MG tablet Take 1 tablet (200 mg total) by mouth at bedtime. Give on an empty stomach 1 hour before or 2 hours after meals. 09/20/18  Yes Ladell Pier, MD  spironolactone (ALDACTONE) 50 MG tablet Take 1 tablet (50 mg total) by mouth daily. 08/26/18 09/30/18 Yes Regalado, Belkys A, MD  sucralfate (CARAFATE) 1 GM/10ML suspension Take 1 g by mouth 4 (four) times daily -  with meals and at bedtime.   Yes [provider]  thiamine 100 MG tablet Take 1 tablet (100 mg total) by mouth daily. 09/08/18  Yes Sheikh, Omair Latif, DO  vitamin C (ASCORBIC ACID) 500 MG tablet Take 500 mg by mouth daily.   Yes [provider]  vitamin E 400 UNIT capsule Take 400 Units by mouth daily.   Yes [provider]   Allergies  Allergen Reactions  . Aspirin Other (See Comments)    Does not take because of Hep C    Review of Systems +weakness  Physical Exam Frail weak gentleman abd is less distended S 1 S 2  Diminished breath sounds bases Some edema Awake alert  Vital Signs: BP 116/67 (BP Location: Right Arm)   Pulse 83   Temp 98.2 F (36.8 C) (Oral)   Resp 16   Ht 5' 6"  (1.676 m)   Wt 61.1 kg   SpO2 99%  BMI 21.74 kg/m  Pain Scale: 0-10 POSS *See Group Information*: 1-Acceptable,Awake and alert Pain Score: 3    SpO2: SpO2: 99 % O2 Device:SpO2: 99 % O2 Flow Rate: .   IO: Intake/output summary:   Intake/Output Summary (Last 24 hours) at 10/01/2018 1534 Last data filed at 10/01/2018 0600 Gross per 24 hour  Intake 503.99 ml  Output -  Net 503.99 ml    LBM: Last BM Date: 10/01/18 Baseline Weight: Weight: 66.2 kg Most recent weight: Weight: 61.1 kg     Palliative Assessment/Data:   PPS 30%  Time In:  1400 Time Out:  1500 Time Total:  60 min  Greater than 50%  of this time was spent counseling and coordinating care related to the  above assessment and plan.  Signed by: Loistine Chance, MD  [phone] Please contact Palliative Medicine Team phone at [phone] for questions and concerns.  For individual provider: See Shea Evans

## 2018-10-01 NOTE — Progress Notes (Signed)
Pharmacy Antibiotic Note  Peter Nelson is a 67 y.o. male with metastatic liver cancer on sorafenib PTA, presented to the ED on 09/30/2018 with c/o fever and abd pain.  Patient's currently on cefepime and  vancomycin for sepsis.  Today, 10/01/2018: - afeb, wbc wnl - scr up 1.54 (crcl~40) -  LA elevated   Plan: - continue cefepime 1gm IV q12h - adjust vancomycin dose to 750 mg IV q24h for est AUC 501 - monitor renal function closely  _______________________________  Height: 5' 6"  (167.6 cm) Weight: 134 lb 11.2 oz (61.1 kg) IBW/kg (Calculated) : 63.8  Temp (24hrs), Avg:98 F (36.7 C), Min:97.8 F (36.6 C), Max:98.2 F (36.8 C)  Recent Labs  Lab 09/30/18 0554 09/30/18 0610 09/30/18 1625 10/01/18 0617  WBC 8.0  --   --  9.0  CREATININE 1.18  --   --  1.54*  LATICACIDVEN  --  3.02* 3.5*  --     Estimated Creatinine Clearance: 40.2 mL/min (A) (by C-G formula based on SCr of 1.54 mg/dL (H)).    Allergies  Allergen Reactions  . Aspirin Other (See Comments)    Does not take because of Hep C    Antimicrobials this admission: 12/14 cefepime >>  12/14 vancomycin >> 12/14 flagyl>>12/14  Microbiology results: 12/14 BCx x2:  12/14 UCx: 12/14 peritoneal cavity:    Thank you for allowing pharmacy to be a part of this patient's care.  Lynelle Doctor 10/01/2018 9:17 AM

## 2018-10-02 ENCOUNTER — Inpatient Hospital Stay: Payer: Medicare HMO | Admitting: Nurse Practitioner

## 2018-10-02 ENCOUNTER — Inpatient Hospital Stay: Payer: Medicare HMO

## 2018-10-02 DIAGNOSIS — B182 Chronic viral hepatitis C: Secondary | ICD-10-CM

## 2018-10-02 DIAGNOSIS — C220 Liver cell carcinoma: Secondary | ICD-10-CM

## 2018-10-02 DIAGNOSIS — I8510 Secondary esophageal varices without bleeding: Secondary | ICD-10-CM

## 2018-10-02 DIAGNOSIS — K7030 Alcoholic cirrhosis of liver without ascites: Secondary | ICD-10-CM

## 2018-10-02 DIAGNOSIS — K652 Spontaneous bacterial peritonitis: Secondary | ICD-10-CM

## 2018-10-02 DIAGNOSIS — K7031 Alcoholic cirrhosis of liver with ascites: Secondary | ICD-10-CM

## 2018-10-02 LAB — COMPREHENSIVE METABOLIC PANEL
ALT: 31 U/L (ref 0–44)
AST: 67 U/L — ABNORMAL HIGH (ref 15–41)
Albumin: 2.1 g/dL — ABNORMAL LOW (ref 3.5–5.0)
Alkaline Phosphatase: 84 U/L (ref 38–126)
Anion gap: 4 — ABNORMAL LOW (ref 5–15)
BUN: 30 mg/dL — ABNORMAL HIGH (ref 8–23)
CO2: 20 mmol/L — ABNORMAL LOW (ref 22–32)
Calcium: 7.7 mg/dL — ABNORMAL LOW (ref 8.9–10.3)
Chloride: 109 mmol/L (ref 98–111)
Creatinine, Ser: 1.19 mg/dL (ref 0.61–1.24)
GFR calc Af Amer: >60 mL/min (ref >60)
GFR calc non Af Amer: >60 mL/min (ref >60)
Glucose, Bld: 111 mg/dL — ABNORMAL HIGH (ref 70–99)
Potassium: 3.5 mmol/L (ref 3.5–5.1)
Sodium: 133 mmol/L — ABNORMAL LOW (ref 135–145)
Total Bilirubin: 3 mg/dL — ABNORMAL HIGH (ref 0.3–1.2)
Total Protein: 5.6 g/dL — ABNORMAL LOW (ref 6.5–8.1)

## 2018-10-02 LAB — BODY FLUID CULTURE

## 2018-10-02 LAB — CBC
HCT: 25 % — ABNORMAL LOW (ref 39.0–52.0)
Hemoglobin: 8.1 g/dL — ABNORMAL LOW (ref 13.0–17.0)
MCH: 30.1 pg (ref 26.0–34.0)
MCHC: 32.4 g/dL (ref 30.0–36.0)
MCV: 92.9 fL (ref 80.0–100.0)
PLATELETS: 103 10*3/uL — AB (ref 150–400)
RBC: 2.69 MIL/uL — ABNORMAL LOW (ref 4.22–5.81)
RDW: 16.7 % — ABNORMAL HIGH (ref 11.5–15.5)
WBC: 8.6 10*3/uL (ref 4.0–10.5)
nRBC: 0 % (ref 0.0–0.2)

## 2018-10-02 LAB — PATHOLOGIST SMEAR REVIEW

## 2018-10-02 NOTE — Progress Notes (Addendum)
[facility]                                                                              Patient Demographics  Peter Nelson, is a 67 y.o. male, DOB - 1951/06/28, YQI:[id number]  Admit date - 09/30/2018   Admitting Physician No admitting provider for patient encounter.  Outpatient Primary MD for the patient is Lanae Boast, [place]  Outpatient specialists:   LOS - 2  days    Chief Complaint  Patient presents with  . Fever  . Abdominal Pain       Brief summary   [name] a31 y.o.male,with medical history significant for but not limited to alcoholic liver cirrhosis with decompensation, hep C with hepatocellular carcinoma, recent hospitalizations x2 past month with upper GI bleed secondary to bleeding esophageal varices requiring banding who was recently started on sorafenib by his oncologist (10 days back) presenting with abdominal pain of 2 days duration associated with fever and elevated LFTs.  Patient is admitted to the hospital for sepsis and SBP.     Assessment & Plan    Principal Problem:   Sepsis ([place]) Active Problems:   Tobacco use disorder   Chronic hepatitis C without hepatic coma (HCC)   Hepatocellular carcinoma (HCC)   Esophageal varices in alcoholic cirrhosis (HCC)   BPH (benign prostatic hyperplasia)   Symptomatic anemia   Decompensated liver disease ([place])   Sudden onset of severe abdominal pain   Palliative care by specialist   Goals of care, counseling/discussion    Severe sepsis  ([place]) Given abdominal pain and distention with ascites and recent bleeding esophageal varices, Diagnostic paracentesis done in the ED -highly suggestive of SBP, WBC 9K, predominantly (96%) neutrophils. Patient was on empiric IV cefepime and vancomycin.  Ascitic fluid culture shows MRSA.  Continue vancomycin, discontinued cefepime Ongoing abdominal pain but no acute findings on abdominal exam.  Decompensated liver disease with bleeding esophageal  varices (HCC) Combination of alcoholic liver disease and hep C with hepatocellular carcinoma.   Recent hospitalization for GI bleed with esophageal varices that was banded about 5 weeks back.  He was rehospitalized with melena and hematemesis 3 weeks ago and again underwent EGD showing many superficial nonbleeding esophageal ulcer and nonbleeding esophageal varices, portal hypertensive gastropathy and nonbleeding duodenal erosions.  GI commended for twice daily PPI and Carafate. Patient denies any hematemesis or melena at this time.   Continue propanolol and Aldactone.  Total bilirubin has elevated. Monitor hematologic indicis and LFTs Status post 4.6 L therapeutic paracentesis done by IR on 12/15.  Hepatocellular carcinoma /Chronic hepatitis C without hepatic coma Being followed by Peter Nelson - deemed not surgical candidate candidate - on sorafenib. Hold sorafenib for now in the setting of sepsis.  Patient noted with intermittent diarrhea.  Not on lactulose. Will alert Peter Nelson by adding to care team.  Esophageal varices in alcoholic cirrhosis ([place]) Stable  continue twice daily PPI and Carafate No overt bleeding reported.  Abdominal pain On chronic oxycodone at home which is continued.  Secondary to SBP.  Iron deficiency anemia Hemoglobin has dropped from 9.4-8.1 in the absence of overt bleeding.  Follow CBC  in a.m.  Transfuse if hemoglobin 7 g or less.  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  Follow CBCs.  Acute kidney injury:  Creatinine was at 1.54 yesterday and has normalized today.  Monitor closely while on diuretics.  Tobacco use disorder Counseled strongly on cessation.  Nicotine patch applied  Adult failure to thrive PMT input appreciated.  Code Status: Full code DVT Prophylaxis:  SCD's Family Communication: None at bedside. Disposition Plan: DC home pending clinical improvement.    Procedures:  Abdominal paracentesis by ED physician Therapeutic  abdominal paracentesis by IR on 12/15.  Consultants:   IR PMT  Antimicrobials:   Vancomycin continued.  Cefepime discontinued.   Medications  Scheduled Meds: . Chlorhexidine Gluconate Cloth  6 each Topical Q0600  . ferrous sulfate  325 mg Oral Q breakfast  . metoCLOPramide  10 mg Oral TID AC  . mupirocin ointment  1 application Nasal BID  . nadolol  20 mg Oral Daily  . nicotine  14 mg Transdermal Daily  . pantoprazole  40 mg Oral Daily  . pyridOXINE  100 mg Oral Daily  . spironolactone  50 mg Oral Daily  . sucralfate  1 g Oral TID WC & HS  . thiamine  100 mg Oral Daily  . vitamin C  500 mg Oral Daily  . vitamin E  400 Units Oral Daily   Continuous Infusions: . ceFEPime (MAXIPIME) IV 200 mL/hr at 10/02/18 0600  . vancomycin 750 mg (10/02/18 0904)   PRN Meds:.ondansetron **OR** ondansetron (ZOFRAN) IV, oxyCODONE, polyethylene glycol   Antibiotics   Anti-infectives (From admission, onward)   Start     Dose/Rate Route Frequency Ordered Stop   10/01/18 1000  vancomycin (VANCOCIN) IVPB 750 mg/150 ml premix     750 mg 150 mL/hr over 60 Minutes Intravenous Every 24 hours 10/01/18 0914     10/01/18 0800  vancomycin (VANCOCIN) IVPB 1000 mg/200 mL premix  Status:  Discontinued     1,000 mg 200 mL/hr over 60 Minutes Intravenous Every 24 hours 09/30/18 1717 10/01/18 0914   09/30/18 1800  ceFEPIme (MAXIPIME) 1 g in sodium chloride 0.9 % 100 mL IVPB     1 g 200 mL/hr over 30 Minutes Intravenous Every 12 hours 09/30/18 1003     09/30/18 0630  ceFEPIme (MAXIPIME) 2 g in sodium chloride 0.9 % 100 mL IVPB     2 g 200 mL/hr over 30 Minutes Intravenous  Once 09/30/18 0619 09/30/18 0749   09/30/18 0630  metroNIDAZOLE (FLAGYL) IVPB 500 mg  Status:  Discontinued     500 mg 100 mL/hr over 60 Minutes Intravenous Every 8 hours 09/30/18 0619 09/30/18 1704   09/30/18 0630  vancomycin (VANCOCIN) IVPB 1000 mg/200 mL premix  Status:  Discontinued     1,000 mg 200 mL/hr over 60 Minutes  Intravenous  Once 09/30/18 0619 09/30/18 0627   09/30/18 0630  vancomycin (VANCOCIN) 1,500 mg in sodium chloride 0.9 % 500 mL IVPB     1,500 mg 250 mL/hr over 120 Minutes Intravenous  Once 09/30/18 0627 09/30/18 0953        Subjective:   Reports some abdominal pain.  As per RN, some diarrhea but no details available.  Intermittent mild dyspnea without cough or chest pain.  No fever or chills.  Objective:   Vitals:   10/01/18 0851 10/01/18 1329 10/01/18 2025 10/02/18 0448  BP: 110/73 116/67 110/69 103/67  Pulse:  83 93 83  Resp:  16 16 16   Temp:  98.2  F (36.8 C) (!) 100.5 F (38.1 C) 99.7 F (37.6 C)  TempSrc:  Oral Oral Oral  SpO2:  99% 96% 96%  Weight:      Height:        Intake/Output Summary (Last 24 hours) at 10/02/2018 1321 Last data filed at 10/02/2018 0086 Gross per 24 hour  Intake 387.23 ml  Output -  Net 387.23 ml     Wt Readings from Last 3 Encounters:  09/30/18 61.1 kg  09/23/18 63 kg  09/20/18 63.2 kg     Exam  General: Middle-age male, small built, thinly nourished, frail, chronically ill looking lying comfortably propped up in bed.  Respiratory system: Slightly diminished breath sounds in the bases but otherwise clear to auscultation.  Cardiovascular system: First and second heart sounds heard, RRR.  No JVD, murmurs or pedal edema.  Telemetry: Sinus rhythm.  Abdomen: Moderately distended, soft and nontender.  No organomegaly or masses appreciated.  Normal bowel sounds heard.  Ascites + +.  CNS: Alert and oriented.  No focal neurological deficits.  Extremities: Symmetric 5 x 5 power.   Data Reviewed:  I have personally reviewed following labs and imaging studies  Micro Results Recent Results (from the past 240 hour(s))  Culture, blood (Routine x 2)     Status: None (Preliminary result)   Collection Time: 09/30/18  5:54 AM  Result Value Ref Range Status   Specimen Description   Final    BLOOD RIGHT WRIST Performed at [facility] [address] [place], [place] [address]    Special Requests   Final    BOTTLES DRAWN AEROBIC AND ANAEROBIC Blood Culture adequate volume Performed at [facility] [address] [place], [place] [address]    Culture   Final    NO GROWTH 2 DAYS Performed at [place] [address] [place], [place] [address]    Report Status PENDING  Incomplete  Culture, blood (Routine x 2)     Status: None (Preliminary result)   Collection Time: 09/30/18  5:54 AM  Result Value Ref Range Status   Specimen Description   Final    BLOOD BLOOD RIGHT FOREARM Performed at [facility] [address], [place], [place] [address]    Special Requests   Final    BOTTLES DRAWN AEROBIC AND ANAEROBIC Blood Culture adequate volume Performed at [facility] [address], [place], [place] [address]    Culture   Final    NO GROWTH 2 DAYS Performed at [facility] [address] [place], [place] [address]    Report Status PENDING  Incomplete  Urine culture     Status: None   Collection Time: 09/30/18  7:26 AM  Result Value Ref Range Status   Specimen Description   Final    URINE, RANDOM Performed at [facility] [address], [place], [place] [address]    Special Requests   Final    NONE Performed at [facility], [place] [address], [place], [place] [address]    Culture   Final    NO GROWTH Performed at [facility], [place] [address] [place], [place] [address]    Report Status 10/01/2018 FINAL  Final  Body fluid culture     Status: None   Collection Time: 09/30/18  8:18 AM  Result Value Ref Range Status   Specimen Description   Final    PERITONEAL  CAVITY Performed at [facility], [place] [address] [place], [place] [address]    Special Requests   Final    Immunocompromised Performed at [facility], [place] [address], [place], [place] [address]    Gram Stain   Final    ABUNDANT WBC PRESENT, PREDOMINANTLY PMN NO ORGANISMS SEEN    Culture   Final    FEW METHICILLIN RESISTANT STAPHYLOCOCCUS AUREUS CRITICAL RESULT CALLED TO, READ BACK BY AND VERIFIED WITH: Jerrye Noble RN, AT 1123 10/01/18 BY D. VANHOOK REGARDING CULTURE GROWTH Performed at [facility], [place] [address], [place], [place] [address]    Report Status 10/02/2018 FINAL  Final   Organism ID, Bacteria METHICILLIN RESISTANT STAPHYLOCOCCUS AUREUS  Final      Susceptibility   Methicillin resistant staphylococcus aureus - MIC*    CIPROFLOXACIN >=8 RESISTANT Resistant     ERYTHROMYCIN >=8 RESISTANT Resistant     GENTAMICIN <=0.5 SENSITIVE Sensitive     OXACILLIN >=4 RESISTANT Resistant     TETRACYCLINE <=1 SENSITIVE Sensitive     VANCOMYCIN <=0.5 SENSITIVE Sensitive     TRIMETH/SULFA <=10 SENSITIVE Sensitive     CLINDAMYCIN <=0.25 SENSITIVE Sensitive     RIFAMPIN <=0.5 SENSITIVE Sensitive     Inducible Clindamycin NEGATIVE Sensitive     * FEW METHICILLIN RESISTANT STAPHYLOCOCCUS AUREUS  MRSA PCR Screening     Status: Abnormal   Collection Time: 09/30/18 12:12 PM  Result Value Ref Range Status   MRSA by PCR POSITIVE (A) NEGATIVE Final    Comment: RESULT CALLED TO, READ BACK BY AND VERIFIED WITH: BULLINS,H RN [phone] [place],N        The GeneXpert MRSA Assay (FDA approved for NASAL specimens only), is one component of a comprehensive MRSA colonization surveillance program. It is not intended to diagnose MRSA infection nor to guide or monitor treatment for MRSA infections. Performed at [facility], [place] [address], [place], [place] [address]     Radiology Reports Dg Chest 2 View  Result Date: 09/30/2018 CLINICAL DATA:  Rule out sepsis.  Fever. EXAM: CHEST - 2 VIEW COMPARISON:  Radiographs 09/12/2018 FINDINGS: The cardiomediastinal contours are normal. Lung volumes are low. Improved bibasilar atelectasis  with probable scarring at the right lung base. Pulmonary vasculature is normal. No consolidation, pleural effusion, or pneumothorax. No acute osseous abnormalities are seen. IMPRESSION: Low lung volumes. No confluent airspace disease to suggest pneumonia. Improved bibasilar atelectasis since last month with residual atelectasis versus scarring at the right lung base. Electronically Signed   By: Keith Rake M.D.   On: 09/30/2018 05:54   Dg Chest 2 View  Result Date: 09/12/2018 CLINICAL DATA:  Chest pain and peripheral edema since endoscopy last week. History of liver malignancy and recent GI bleed. No cough, shortness of breath, or chest congestion. Current smoker. EXAM: CHEST - 2 VIEW COMPARISON:  Chest x-ray of August 07, 2018 FINDINGS: The lungs are adequately inflated. The interstitial markings are mildly increased. The heart is top-normal in size. The pulmonary vascularity is not clearly engorged. There is calcification in the wall of the aortic arch. The bony thorax exhibits no acute abnormality. IMPRESSION: Coarse lung markings at both bases suggests subsegmental atelectasis. No definite pneumonia nor pulmonary edema. Thoracic aortic atherosclerosis. Electronically Signed   By: David  Martinique M.D.   On: 09/12/2018 10:00   US Paracentesis  Result Date: 10/01/2018 INDICATION: Patient with history of hepatocellular carcinoma, cirrhosis, hepatitis-C, recurrent ascites. Request made for therapeutic  paracentesis. EXAM: ULTRASOUND GUIDED THERAPEUTIC PARACENTESIS MEDICATIONS: None COMPLICATIONS: None immediate. PROCEDURE: Informed written consent was obtained from the patient after a discussion of the risks, benefits and alternatives to treatment. A timeout was performed prior to the initiation of the procedure. Initial ultrasound scanning demonstrates a moderate to large amount of ascites within the right mid to lower abdominal quadrant. The right mid to lower abdomen was prepped and draped in the  usual sterile fashion. 1% lidocaine was used for local anesthesia. Following this, a 19 gauge, 7-cm, Yueh catheter was introduced. An ultrasound image was saved for documentation purposes. The paracentesis was performed. The catheter was removed and a dressing was applied. The patient tolerated the procedure well without immediate post procedural complication. FINDINGS: A total of approximately 4.6 liters of hazy, yellow fluid was removed. IMPRESSION: Successful ultrasound-guided therapeutic paracentesis yielding 4.6 liters liters of peritoneal fluid. Read by: Rowe Robert, PA-C Electronically Signed   By: Sandi Mariscal M.D.   On: 10/01/2018 11:22   US Paracentesis  Result Date: 09/20/2018 INDICATION: Patient with history of hepatocellular carcinoma, esophageal varices with prior banding, cirrhosis, hepatitis-C, recurrent ascites. Request made for therapeutic paracentesis up to 5 liters. EXAM: ULTRASOUND GUIDED THERAPEUTIC PARACENTESIS MEDICATIONS: None COMPLICATIONS: None immediate. PROCEDURE: Informed written consent was obtained from the patient after a discussion of the risks, benefits and alternatives to treatment. A timeout was performed prior to the initiation of the procedure. Initial ultrasound scanning demonstrates a large amount of ascites within the left mid to lower abdominal quadrant. The left mid to lower abdomen was prepped and draped in the usual sterile fashion. 1% lidocaine was used for local anesthesia. Following this, a 19 gauge, 7-cm, Yueh catheter was introduced. An ultrasound image was saved for documentation purposes. The paracentesis was performed. The catheter was removed and a dressing was applied. The patient tolerated the procedure well without immediate post procedural complication. FINDINGS: A total of approximately 5 liters of clear, yellow fluid was removed. IMPRESSION: Successful ultrasound-guided therapeutic paracentesis yielding 5 liters of peritoneal fluid. Read by: Rowe Robert, PA-C Electronically Signed   By: Jerilynn Mages.  Shick M.D.   On: 09/20/2018 16:55   US Paracentesis  Result Date: 09/12/2018 INDICATION: Patient with history of hepatocellular carcinoma, esophageal varices with prior banding, cirrhosis, hepatitis-C, recurrent ascites. Request made for therapeutic paracentesis. EXAM: ULTRASOUND GUIDED THERAPEUTIC PARACENTESIS MEDICATIONS: None COMPLICATIONS: None immediate. PROCEDURE: Informed written consent was obtained from the patient after a discussion of the risks, benefits and alternatives to treatment. A timeout was performed prior to the initiation of the procedure. Initial ultrasound scanning demonstrates a amount of ascites within the right upper to mid abdominal quadrant. The right upper to mid abdomen was prepped and draped in the usual sterile fashion. 1% lidocaine was used for local anesthesia. Following this, a 19 gauge, 7-cm, Yueh catheter was introduced. An ultrasound image was saved for documentation purposes. The paracentesis was performed. The catheter was removed and a dressing was applied. The patient tolerated the procedure well without immediate post procedural complication. FINDINGS: A total of approximately 4.1 liters of yellow fluid was removed. IMPRESSION: Successful ultrasound-guided therapeutic paracentesis yielding 4.1 liters of peritoneal fluid. Read by: Rowe Robert, PA-C Electronically Signed   By: Marybelle Killings M.D.   On: 09/12/2018 14:07    Lab Data:  CBC: Recent Labs  Lab 09/30/18 0554 10/01/18 0617 10/02/18 0534  WBC 8.0 9.0 8.6  NEUTROABS 7.1  --   --   HGB 10.2* 9.4* 8.1*  HCT 32.3*  29.2* 25.0*  MCV 93.9 95.1 92.9  PLT 156 138* 353*   Basic Metabolic Panel: Recent Labs  Lab 09/30/18 0554 10/01/18 0617 10/02/18 0534  NA 131* 132* 133*  K 4.2 4.7 3.5  CL 103 108 109  CO2 20* 18* 20*  GLUCOSE 76 92 111*  BUN 19 34* 30*  CREATININE 1.18 1.54* 1.19  CALCIUM 7.7* 7.6* 7.7*   GFR: Estimated Creatinine Clearance:  52.1 mL/min (by C-G formula based on SCr of 1.19 mg/dL). Liver Function Tests: Recent Labs  Lab 09/30/18 0554 10/01/18 0617 10/02/18 0534  AST 85* 80* 67*  ALT 39 33 31  ALKPHOS 124 99 84  BILITOT 6.1* 4.3* 3.0*  PROT 7.3 6.3* 5.6*  ALBUMIN 2.0* 1.7* 2.1*   Coagulation Profile: Recent Labs  Lab 09/30/18 0554 10/01/18 0617  INR 1.95 2.37   Urine analysis:    Component Value Date/Time   COLORURINE AMBER (A) 09/30/2018 0726   APPEARANCEUR CLEAR 09/30/2018 0726   LABSPEC 1.025 09/30/2018 0726   PHURINE 5.0 09/30/2018 0726   GLUCOSEU NEGATIVE 09/30/2018 0726   HGBUR NEGATIVE 09/30/2018 0726   BILIRUBINUR SMALL (A) 09/30/2018 0726   KETONESUR NEGATIVE 09/30/2018 0726   PROTEINUR NEGATIVE 09/30/2018 0726   UROBILINOGEN >=8.0 05/04/2017 0914   NITRITE NEGATIVE 09/30/2018 0726   LEUKOCYTESUR NEGATIVE 09/30/2018 0726     Vernell Leep, MD, FACP, FHM. [facility] Pager [phone]  If 7PM-7AM, please contact night-coverage [email or web address] Password [email or web address] 10/02/2018, 1:31 PM

## 2018-10-02 NOTE — Progress Notes (Signed)
Daily Progress Note   Patient Name: Peter Nelson       Date: 10/02/2018 DOB: 01-24-1951  Age: 67 y.o. MRN#: [id number] Attending Physician: Modena Jansky, MD Primary Care Physician: Lanae Boast, FNP Admit Date: 09/30/2018  Reason for Consultation/Follow-up: Establishing goals of care  Subjective:  patient is resting in bed, he denies any pain.  There is no family at bedside See below:  Length of Stay: 2  Current Medications: Scheduled Meds:  . Chlorhexidine Gluconate Cloth  6 each Topical Q0600  . ferrous sulfate  325 mg Oral Q breakfast  . metoCLOPramide  10 mg Oral TID AC  . mupirocin ointment  1 application Nasal BID  . nadolol  20 mg Oral Daily  . nicotine  14 mg Transdermal Daily  . pantoprazole  40 mg Oral Daily  . pyridOXINE  100 mg Oral Daily  . spironolactone  50 mg Oral Daily  . sucralfate  1 g Oral TID WC & HS  . thiamine  100 mg Oral Daily  . vitamin C  500 mg Oral Daily  . vitamin E  400 Units Oral Daily    Continuous Infusions: . ceFEPime (MAXIPIME) IV 200 mL/hr at 10/02/18 0600  . vancomycin 750 mg (10/02/18 0904)    PRN Meds: ondansetron **OR** ondansetron (ZOFRAN) IV, oxyCODONE, polyethylene glycol  Physical Exam         Frail appearing gentleman Abdomen is distended Clear breath sounds  S 1 S 2  No edema Flat affect   Vital Signs: BP 103/67 (BP Location: Left Arm)   Pulse 83   Temp 99.7 F (37.6 C) (Oral)   Resp 16   Ht 5' 6"  (1.676 m)   Wt 61.1 kg   SpO2 96%   BMI 21.74 kg/m  SpO2: SpO2: 96 % O2 Device: O2 Device: Room Air O2 Flow Rate:    Intake/output summary:   Intake/Output Summary (Last 24 hours) at 10/02/2018 1248 Last data filed at 10/02/2018 5927 Gross per 24 hour  Intake 387.23 ml  Output -  Net 387.23 ml    LBM: Last BM Date: 10/02/18 Baseline Weight: Weight: 66.2 kg Most recent weight: Weight: 61.1 kg       Palliative Assessment/Data: PPS 30%     Patient Active Problem List   Diagnosis Date Noted  .  Sudden onset of severe abdominal pain   . Palliative care by specialist   . Goals of care, counseling/discussion   . Sepsis ([place]) 09/30/2018  . Decompensated liver disease ([place]) 09/30/2018  . Duodenal nodule   . Symptomatic anemia 09/05/2018  . Bleeding esophageal varices ([place]) 08/23/2018  . History of hepatitis C 08/23/2018  . HTN (hypertension) 08/23/2018  . BPH (benign prostatic hyperplasia) 08/23/2018  . Hematemesis 08/08/2018  . Mallory-Weiss tear   . Acute GI bleeding 08/07/2018  . Acute hypokalemia 01/12/2017  . Acute upper GI bleeding 12/29/2016  . Esophageal varices in alcoholic cirrhosis ([place])   . Ascites February 25, 202018  . Hemorrhage of esophageal varices in alcoholic cirrhosis ([place])   . UGIB (upper gastrointestinal bleed)   . Acute blood loss anemia 10/25/2016  . Enlarged prostate on rectal examination 10/01/2016  . Erectile dysfunction 10/01/2016  . Abscess of right lower leg 03/15/2016  . Eczema 01/26/2016  . Hepatocellular carcinoma ([place]) 11/13/2015  . Memory loss 11/10/2015  . Alcoholic cirrhosis of liver ([place]) 11/10/2015  . Hepatitis, viral 11/10/2015  . Chronic hepatitis C without hepatic coma ([place]) 09/17/2015  . Stress at home 08/26/2015  . Alcohol abuse 07/28/2015  . Tobacco use disorder 07/28/2015  . Syncope 05/08/2015    Palliative Care Assessment & Plan   Patient Profile:     Assessment:  [name] a67 y.o.male,with history of alcoholic liver cirrhosis with decompensation, hep C with hepatocellular carcinoma, recent hospitalizations x2 past month with upper GI bleed secondary to bleeding esophageal varices requiring banding who was recently started on sorafenib by his oncologist (10 days back) presented to the hospital with abdominal  pain. He has been admitted with sepsis, decompensated liver disease, esophageal varices, he has life limiting illness of hepatocellular carcinoma.    A palliative consult has been requested for goals of care discussions.   Recommendations/Plan:   1. After discussing with patient as well as his sister over the phone, code status now revised as DNR DNI. Discussed with patient in detail. 2. Continue current mode of care.  3. Recommend SNF rehab with palliative on discharge.    Code Status:    Code Status Orders  (From admission, onward)         Start     Ordered   10/02/18 1249  Do not attempt resuscitation (DNR)  Continuous    Question Answer Comment  In the event of cardiac or respiratory ARREST Do not call a "code blue"   In the event of cardiac or respiratory ARREST Do not perform Intubation, CPR, defibrillation or ACLS   In the event of cardiac or respiratory ARREST Use medication by any route, position, wound care, and other measures to relive pain and suffering. May use oxygen, suction and manual treatment of airway obstruction as needed for comfort.      10/02/18 1248        Code Status History    Date Active Date Inactive Code Status Order ID Comments User Context   09/30/2018 0937 10/02/2018 1248 Full Code [id number]  Louellen Molder, MD Inpatient   09/05/2018 1955 09/07/2018 1603 Full Code [id number]  Vianne Bulls, MD ED   08/22/2018 2342 08/26/2018 1834 Full Code [id number]  Shela Leff, MD ED   08/07/2018 2112 08/10/2018 2212 Full Code [id number]  Rise Patience, MD ED   01/11/2017 1456 01/12/2017 2103 Full Code [id number]  Caren Griffins, MD Inpatient   12/29/2016 2014 01/01/2017 1525 Full Code [id number]  Vianne Bulls, MD ED  10/25/2016 2347 10/30/2016 1942 Full Code [id number]  Rise Patience, MD ED   10/25/2016 2249 10/25/2016 2346 Full Code [id number]  Varney Biles, MD ED   03/15/2016 2043 03/16/2016 1451 Full Code [id number]  Etta Quill, DO ED     Advance Directive Documentation     Most Recent Value  Type of Advance Directive  Healthcare Power of Attorney  Pre-existing out of facility DNR order (yellow form or pink MOST form)  -  "MOST" Form in Place?  -       Prognosis:   Unable to determine  Discharge Planning:  To Be Determined  Care plan was discussed with   Patient and sister   Thank you for allowing the Palliative Medicine Team to assist in the care of this patient.   Time In:  12 Time Out: 12.35 Total Time  35  Prolonged Time Billed  no       Greater than 50%  of this time was spent counseling and coordinating care related to the above assessment and plan.  Loistine Chance, MD [phone]  Please contact Palliative Medicine Team phone at [phone] for questions and concerns.

## 2018-10-02 NOTE — Consult Note (Signed)
   [facility] CM Inpatient Consult   10/02/2018  Tray [facility] [place] [birth year] [phone]    Patient screened for potential [facility] Care Management services due to unplanned readmission risk score of 40% (extreme) and multiple hospitalizations.  Chart reviewed. Mr. Normand is eligible or enrolled in [place] program. Verified on All Payers List. [employer] Special Needs program provides case management services. Therefore, [facility] Care Management not appropriate at this time.   Marthenia Rolling, MSN-Ed, RN,BSN [facility] Liaison [phone]

## 2018-10-03 ENCOUNTER — Other Ambulatory Visit: Payer: Medicare HMO

## 2018-10-03 ENCOUNTER — Ambulatory Visit: Payer: Medicare HMO | Admitting: Nurse Practitioner

## 2018-10-03 LAB — CBC
HCT: 26.3 % — ABNORMAL LOW (ref 39.0–52.0)
Hemoglobin: 8.5 g/dL — ABNORMAL LOW (ref 13.0–17.0)
MCH: 29.9 pg (ref 26.0–34.0)
MCHC: 32.3 g/dL (ref 30.0–36.0)
MCV: 92.6 fL (ref 80.0–100.0)
PLATELETS: 112 10*3/uL — AB (ref 150–400)
RBC: 2.84 MIL/uL — ABNORMAL LOW (ref 4.22–5.81)
RDW: 16.8 % — AB (ref 11.5–15.5)
WBC: 7 10*3/uL (ref 4.0–10.5)
nRBC: 0 % (ref 0.0–0.2)

## 2018-10-03 LAB — COMPREHENSIVE METABOLIC PANEL
ALT: 31 U/L (ref 0–44)
AST: 68 U/L — ABNORMAL HIGH (ref 15–41)
Albumin: 1.9 g/dL — ABNORMAL LOW (ref 3.5–5.0)
Alkaline Phosphatase: 94 U/L (ref 38–126)
Anion gap: 6 (ref 5–15)
BUN: 24 mg/dL — ABNORMAL HIGH (ref 8–23)
CHLORIDE: 109 mmol/L (ref 98–111)
CO2: 17 mmol/L — ABNORMAL LOW (ref 22–32)
Calcium: 7.6 mg/dL — ABNORMAL LOW (ref 8.9–10.3)
Creatinine, Ser: 1.21 mg/dL (ref 0.61–1.24)
GFR calc Af Amer: >60 mL/min (ref >60)
GFR calc non Af Amer: >60 mL/min (ref >60)
Glucose, Bld: 90 mg/dL (ref 70–99)
Potassium: 3.3 mmol/L — ABNORMAL LOW (ref 3.5–5.1)
Sodium: 132 mmol/L — ABNORMAL LOW (ref 135–145)
Total Bilirubin: 2.6 mg/dL — ABNORMAL HIGH (ref 0.3–1.2)
Total Protein: 6 g/dL — ABNORMAL LOW (ref 6.5–8.1)

## 2018-10-03 LAB — MAGNESIUM: Magnesium: 1.9 mg/dL (ref 1.7–2.4)

## 2018-10-03 MED ORDER — POTASSIUM CHLORIDE CRYS ER 20 MEQ PO TBCR
40.0000 meq | EXTENDED_RELEASE_TABLET | Freq: Once | ORAL | Status: DC
  Filled 2018-10-03: qty 2

## 2018-10-03 NOTE — Progress Notes (Signed)
PT Cancellation Note  Patient Details Name: [name] MRN: [id number] DOB: [birth year]   Cancelled Treatment:    Reason Eval/Treat Not Completed: Pain limiting ability to participate, patient in bed with covers over head. Recently received pain medication and unable to mobilize, per patient. Check back another time.   Claretha Cooper 10/03/2018, 1:11 PM [place] Pager [phone] Office [phone]

## 2018-10-03 NOTE — Progress Notes (Signed)
[facility]                                                                              Patient Demographics  Peter Nelson, is a 67 y.o. male, DOB - 1951/05/02, TLX:[id number]  Admit date - 09/30/2018   Admitting Physician No admitting provider for patient encounter.  Outpatient Primary MD for the patient is Lanae Boast, [place]  Outpatient specialists:   LOS - 3  days    Chief Complaint  Patient presents with  . Fever  . Abdominal Pain       Brief summary   Peter Nelson a9 y.o.male,with medical history significant for but not limited to alcoholic liver cirrhosis with decompensation, hep C with hepatocellular carcinoma, recent hospitalizations x2 past month with upper GI bleed secondary to bleeding esophageal varices requiring banding who was recently started on sorafenib by his oncologist (10 days back) presenting with abdominal pain of 2 days duration associated with fever and elevated LFTs.  Patient is admitted to the hospital for sepsis and SBP.     Assessment & Plan    Principal Problem:   Sepsis ([place]) Active Problems:   Tobacco use disorder   Chronic hepatitis C without hepatic coma (HCC)   Hepatocellular carcinoma (HCC)   Esophageal varices in alcoholic cirrhosis (HCC)   BPH (benign prostatic hyperplasia)   Symptomatic anemia   Decompensated liver disease ([place])   Sudden onset of severe abdominal pain   Palliative care by specialist   Goals of care, counseling/discussion    Severe sepsis  ([place]) due to SBP Given abdominal pain and distention with ascites and recent bleeding esophageal varices, Diagnostic paracentesis done in the ED -highly suggestive of SBP, WBC 9K, predominantly (96%) neutrophils. Patient was on empiric IV cefepime and vancomycin.  Ascitic fluid culture shows MRSA.  Continue vancomycin, discontinued cefepime Abdominal pain better. Please consult ID [id number] regarding duration of treatment of SBP and if there is any need  for further evaluation. MRSA SBP could be related to seeding from repeated taps.  Decompensated liver disease with bleeding esophageal varices (HCC) Combination of alcoholic liver disease and hep C with hepatocellular carcinoma.   Recent hospitalization for GI bleed with esophageal varices that was banded about 5 weeks back.  He was rehospitalized with melena and hematemesis 3 weeks ago and again underwent EGD showing many superficial nonbleeding esophageal ulcer and nonbleeding esophageal varices, portal hypertensive gastropathy and nonbleeding duodenal erosions.  GI commended for twice daily PPI and Carafate. Patient denies any hematemesis or melena at this time.   Continue propanolol and Aldactone.  Total bilirubin has elevated. Monitor hematologic indicis and LFTs Status post 4.6 L therapeutic paracentesis done by IR on 12/15.  Hepatocellular carcinoma /Chronic hepatitis C without hepatic coma Being followed by Dr. Learta Codding - deemed not surgical candidate candidate - on sorafenib. Hold sorafenib for now in the setting of sepsis.  Patient noted with intermittent diarrhea.  Not on lactulose. I discussed with Dr. Benay Spice, oncology on 12/17 and he will arrange outpatient follow-up with him.  He agreed with holding sorafenib at this time and will decide on this during outpatient follow-up.  He will  also discussed regarding goals of care and hospice during outpatient visit.  Esophageal varices in alcoholic cirrhosis (HCC) Stable  continue twice daily PPI and Carafate No overt bleeding reported.  Abdominal pain On chronic oxycodone at home which is continued.  Secondary to SBP.  Improving.  Iron deficiency anemia Hemoglobin has dropped from 9.4-8.1 in the absence of overt bleeding.  Follow CBC in a.m.  Transfuse if hemoglobin 7 g or less.  Stable.  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  Follow CBCs.  Stable.  Acute kidney injury:  Creatinine was at 1.54 which has normalized.   Monitor closely while on diuretics.  Tobacco use disorder Counseled strongly on cessation.  Nicotine patch applied  Adult failure to thrive PMT input appreciated.  Hypokalemia Replaced.  Follow BMP.  Code Status: Full code DVT Prophylaxis:  SCD's Family Communication: None at bedside. Disposition Plan: DC home pending clinical improvement.  Not medically ready for discharge.    Procedures:  Abdominal paracentesis by ED physician Therapeutic abdominal paracentesis by IR on 12/15.  Consultants:   IR PMT  Antimicrobials:   Vancomycin continued.  Cefepime discontinued.   Medications  Scheduled Meds: . Chlorhexidine Gluconate Cloth  6 each Topical Q0600  . ferrous sulfate  325 mg Oral Q breakfast  . metoCLOPramide  10 mg Oral TID AC  . mupirocin ointment  1 application Nasal BID  . nadolol  20 mg Oral Daily  . nicotine  14 mg Transdermal Daily  . pantoprazole  40 mg Oral Daily  . potassium chloride  40 mEq Oral Once  . pyridOXINE  100 mg Oral Daily  . spironolactone  50 mg Oral Daily  . sucralfate  1 g Oral TID WC & HS  . thiamine  100 mg Oral Daily  . vitamin C  500 mg Oral Daily  . vitamin E  400 Units Oral Daily   Continuous Infusions: . vancomycin 750 mg (10/03/18 1049)   PRN Meds:.ondansetron **OR** ondansetron (ZOFRAN) IV, oxyCODONE, polyethylene glycol   Antibiotics   Anti-infectives (From admission, onward)   Start     Dose/Rate Route Frequency Ordered Stop   10/01/18 1000  vancomycin (VANCOCIN) IVPB 750 mg/150 ml premix     750 mg 150 mL/hr over 60 Minutes Intravenous Every 24 hours 10/01/18 0914     10/01/18 0800  vancomycin (VANCOCIN) IVPB 1000 mg/200 mL premix  Status:  Discontinued     1,000 mg 200 mL/hr over 60 Minutes Intravenous Every 24 hours 09/30/18 1717 10/01/18 0914   09/30/18 1800  ceFEPIme (MAXIPIME) 1 g in sodium chloride 0.9 % 100 mL IVPB  Status:  Discontinued     1 g 200 mL/hr over 30 Minutes Intravenous Every 12 hours 09/30/18  1003 10/02/18 1335   09/30/18 0630  ceFEPIme (MAXIPIME) 2 g in sodium chloride 0.9 % 100 mL IVPB     2 g 200 mL/hr over 30 Minutes Intravenous  Once 09/30/18 0619 09/30/18 0749   09/30/18 0630  metroNIDAZOLE (FLAGYL) IVPB 500 mg  Status:  Discontinued     500 mg 100 mL/hr over 60 Minutes Intravenous Every 8 hours 09/30/18 0619 09/30/18 1704   09/30/18 0630  vancomycin (VANCOCIN) IVPB 1000 mg/200 mL premix  Status:  Discontinued     1,000 mg 200 mL/hr over 60 Minutes Intravenous  Once 09/30/18 0619 09/30/18 0627   09/30/18 0630  vancomycin (VANCOCIN) 1,500 mg in sodium chloride 0.9 % 500 mL IVPB     1,500 mg 250 mL/hr over 120 Minutes  Intravenous  Once 09/30/18 0627 09/30/18 0953        Subjective:   Abdominal pain better.  No dyspnea reported.  No diarrhea.  Weakness to in hat, brown/green but unable to make out consistency because he was admixed with urine.  No blood noted.  Objective:   Vitals:   10/02/18 2018 10/02/18 2355 10/03/18 0407 10/03/18 1334  BP: 106/71  104/64 111/73  Pulse: 86  79 76  Resp: 14  12 15   Temp: (!) 100.4 F (38 C) 99.1 F (37.3 C) 99.1 F (37.3 C) 98.9 F (37.2 C)  TempSrc: Oral Oral Oral Oral  SpO2: 97%  97% 100%  Weight:      Height:        Intake/Output Summary (Last 24 hours) at 10/03/2018 1648 Last data filed at 10/03/2018 1400 Gross per 24 hour  Intake 720 ml  Output -  Net 720 ml     Wt Readings from Last 3 Encounters:  09/30/18 61.1 kg  09/23/18 63 kg  09/20/18 63.2 kg     Exam  General: Middle-age male, small built, thinly nourished, frail, chronically ill looking lying comfortably propped up in bed.  Seems to be in good spirits this morning.  Respiratory system: Slightly diminished breath sounds in the bases but otherwise clear to auscultation.  Stable without change.  Cardiovascular system: First and second heart sounds heard, RRR.  No JVD, murmurs or pedal edema.  Stable.  Abdomen: Moderately distended, soft and  nontender.  No organomegaly or masses appreciated.  Normal bowel sounds heard.  Ascites + +.  Stable.  CNS: Alert and oriented.  No focal neurological deficits.  Extremities: Symmetric 5 x 5 power.   Data Reviewed:  I have personally reviewed following labs and imaging studies  Micro Results Recent Results (from the past 240 hour(s))  Culture, blood (Routine x 2)     Status: None (Preliminary result)   Collection Time: 09/30/18  5:54 AM  Result Value Ref Range Status   Specimen Description   Final    BLOOD RIGHT WRIST Performed at [facility] [address] [place], [place] [address]    Special Requests   Final    BOTTLES DRAWN AEROBIC AND ANAEROBIC Blood Culture adequate volume Performed at [facility] [address] [place], [place] [address]    Culture   Final    NO GROWTH 3 DAYS Performed at [facility], [place] [address], [place], [place] [address]    Report Status PENDING  Incomplete  Culture, blood (Routine x 2)     Status: None (Preliminary result)   Collection Time: 09/30/18  5:54 AM  Result Value Ref Range Status   Specimen Description   Final    BLOOD BLOOD RIGHT FOREARM Performed at [facility] [address], [place], [place] [address]    Special Requests   Final    BOTTLES DRAWN AEROBIC AND ANAEROBIC Blood Culture adequate volume Performed at [facility] [address] [place], [place] [address]    Culture   Final    NO GROWTH 3 DAYS Performed at [facility], [place] [address], [place], [place] [address]    Report Status PENDING  Incomplete  Urine culture     Status: None   Collection Time: 09/30/18  7:26 AM  Result Value Ref Range Status   Specimen Description   Final    URINE, RANDOM Performed at [facility], [place] [address], [place], [place] [address]    Special Requests   Final    NONE Performed at [facility], [place] [address] [place], [place] [address]    Culture   Final    NO GROWTH Performed at [facility], [place] [address], [place], [place] [address]    Report Status 10/01/2018 FINAL  Final  Body fluid culture     Status: None   Collection Time: 09/30/18  8:18 AM  Result Value Ref Range Status   Specimen Description   Final    PERITONEAL CAVITY Performed at [facility] [address] [place], [place] [address]    Special Requests   Final    Immunocompromised Performed at [facility], [place] [address], [place], [place] [address]    Gram Stain   Final    ABUNDANT WBC PRESENT, PREDOMINANTLY PMN NO ORGANISMS SEEN    Culture   Final    FEW METHICILLIN RESISTANT STAPHYLOCOCCUS AUREUS CRITICAL RESULT CALLED TO, READ BACK BY AND VERIFIED WITH: Jerrye Noble RN, AT 1123 10/01/18 BY D. VANHOOK REGARDING CULTURE GROWTH Performed at [facility], [place] [address] [place], [place] [address]    Report Status 10/02/2018 FINAL  Final   Organism ID, Bacteria METHICILLIN RESISTANT STAPHYLOCOCCUS AUREUS  Final      Susceptibility   Methicillin resistant staphylococcus aureus - MIC*    CIPROFLOXACIN >=8 RESISTANT Resistant     ERYTHROMYCIN >=8 RESISTANT Resistant     GENTAMICIN <=0.5 SENSITIVE Sensitive     OXACILLIN >=4 RESISTANT Resistant     TETRACYCLINE <=1 SENSITIVE Sensitive     VANCOMYCIN <=0.5 SENSITIVE Sensitive     TRIMETH/SULFA <=10 SENSITIVE Sensitive     CLINDAMYCIN <=0.25 SENSITIVE Sensitive     RIFAMPIN <=0.5 SENSITIVE Sensitive     Inducible Clindamycin NEGATIVE Sensitive     * FEW METHICILLIN RESISTANT STAPHYLOCOCCUS AUREUS  MRSA PCR Screening     Status: Abnormal   Collection Time: 09/30/18 12:12 PM  Result Value Ref Range Status   MRSA by PCR POSITIVE (A) NEGATIVE Final    Comment: RESULT CALLED TO, READ BACK BY AND VERIFIED WITH: BULLINS,H RN [phone] [place],N        The GeneXpert MRSA Assay (FDA approved for NASAL  specimens only), is one component of a comprehensive MRSA colonization surveillance program. It is not intended to diagnose MRSA infection nor to guide or monitor treatment for MRSA infections. Performed at [facility], [place] [address], [place], [place] [address]     Radiology Reports Dg Chest 2 View  Result Date: 09/30/2018 CLINICAL DATA:  Rule out sepsis.  Fever. EXAM: CHEST - 2 VIEW COMPARISON:  Radiographs 09/12/2018 FINDINGS: The cardiomediastinal contours are normal. Lung volumes are low. Improved bibasilar atelectasis with probable scarring at the right lung base. Pulmonary vasculature is normal. No consolidation, pleural effusion, or pneumothorax. No acute osseous abnormalities are seen. IMPRESSION: Low lung volumes. No confluent airspace disease to suggest pneumonia. Improved bibasilar atelectasis since last month with residual atelectasis versus scarring at the right lung base. Electronically Signed   By: Keith Rake M.D.   On: 09/30/2018 05:54   Dg Chest 2 View  Result Date: 09/12/2018 CLINICAL DATA:  Chest pain and peripheral edema since endoscopy last week. History of liver malignancy and recent GI bleed. No cough, shortness of breath, or chest congestion. Current smoker. EXAM: CHEST - 2 VIEW COMPARISON:  Chest x-ray  of August 07, 2018 FINDINGS: The lungs are adequately inflated. The interstitial markings are mildly increased. The heart is top-normal in size. The pulmonary vascularity is not clearly engorged. There is calcification in the wall of the aortic arch. The bony thorax exhibits no acute abnormality. IMPRESSION: Coarse lung markings at both bases suggests subsegmental atelectasis. No definite pneumonia nor pulmonary edema. Thoracic aortic atherosclerosis. Electronically Signed   By: David  Martinique M.D.   On: 09/12/2018 10:00   US Paracentesis  Result Date: 10/01/2018 INDICATION: Patient with history of hepatocellular carcinoma, cirrhosis,  hepatitis-C, recurrent ascites. Request made for therapeutic paracentesis. EXAM: ULTRASOUND GUIDED THERAPEUTIC PARACENTESIS MEDICATIONS: None COMPLICATIONS: None immediate. PROCEDURE: Informed written consent was obtained from the patient after a discussion of the risks, benefits and alternatives to treatment. A timeout was performed prior to the initiation of the procedure. Initial ultrasound scanning demonstrates a moderate to large amount of ascites within the right mid to lower abdominal quadrant. The right mid to lower abdomen was prepped and draped in the usual sterile fashion. 1% lidocaine was used for local anesthesia. Following this, a 19 gauge, 7-cm, Yueh catheter was introduced. An ultrasound image was saved for documentation purposes. The paracentesis was performed. The catheter was removed and a dressing was applied. The patient tolerated the procedure well without immediate post procedural complication. FINDINGS: A total of approximately 4.6 liters of hazy, yellow fluid was removed. IMPRESSION: Successful ultrasound-guided therapeutic paracentesis yielding 4.6 liters liters of peritoneal fluid. Read by: Rowe Robert, PA-C Electronically Signed   By: Sandi Mariscal M.D.   On: 10/01/2018 11:22   US Paracentesis  Result Date: 09/20/2018 INDICATION: Patient with history of hepatocellular carcinoma, esophageal varices with prior banding, cirrhosis, hepatitis-C, recurrent ascites. Request made for therapeutic paracentesis up to 5 liters. EXAM: ULTRASOUND GUIDED THERAPEUTIC PARACENTESIS MEDICATIONS: None COMPLICATIONS: None immediate. PROCEDURE: Informed written consent was obtained from the patient after a discussion of the risks, benefits and alternatives to treatment. A timeout was performed prior to the initiation of the procedure. Initial ultrasound scanning demonstrates a large amount of ascites within the left mid to lower abdominal quadrant. The left mid to lower abdomen was prepped and draped in the  usual sterile fashion. 1% lidocaine was used for local anesthesia. Following this, a 19 gauge, 7-cm, Yueh catheter was introduced. An ultrasound image was saved for documentation purposes. The paracentesis was performed. The catheter was removed and a dressing was applied. The patient tolerated the procedure well without immediate post procedural complication. FINDINGS: A total of approximately 5 liters of clear, yellow fluid was removed. IMPRESSION: Successful ultrasound-guided therapeutic paracentesis yielding 5 liters of peritoneal fluid. Read by: Rowe Robert, PA-C Electronically Signed   By: Jerilynn Mages.  Shick M.D.   On: 09/20/2018 16:55   US Paracentesis  Result Date: 09/12/2018 INDICATION: Patient with history of hepatocellular carcinoma, esophageal varices with prior banding, cirrhosis, hepatitis-C, recurrent ascites. Request made for therapeutic paracentesis. EXAM: ULTRASOUND GUIDED THERAPEUTIC PARACENTESIS MEDICATIONS: None COMPLICATIONS: None immediate. PROCEDURE: Informed written consent was obtained from the patient after a discussion of the risks, benefits and alternatives to treatment. A timeout was performed prior to the initiation of the procedure. Initial ultrasound scanning demonstrates a amount of ascites within the right upper to mid abdominal quadrant. The right upper to mid abdomen was prepped and draped in the usual sterile fashion. 1% lidocaine was used for local anesthesia. Following this, a 19 gauge, 7-cm, Yueh catheter was introduced. An ultrasound image was saved for documentation purposes. The paracentesis was  performed. The catheter was removed and a dressing was applied. The patient tolerated the procedure well without immediate post procedural complication. FINDINGS: A total of approximately 4.1 liters of yellow fluid was removed. IMPRESSION: Successful ultrasound-guided therapeutic paracentesis yielding 4.1 liters of peritoneal fluid. Read by: Rowe Robert, PA-C Electronically Signed    By: Marybelle Killings M.D.   On: 09/12/2018 14:07    Lab Data:  CBC: Recent Labs  Lab 09/30/18 0554 10/01/18 0617 10/02/18 0534 10/03/18 0633  WBC 8.0 9.0 8.6 7.0  NEUTROABS 7.1  --   --   --   HGB 10.2* 9.4* 8.1* 8.5*  HCT 32.3* 29.2* 25.0* 26.3*  MCV 93.9 95.1 92.9 92.6  PLT 156 138* 103* 374*   Basic Metabolic Panel: Recent Labs  Lab 09/30/18 0554 10/01/18 0617 10/02/18 0534 10/03/18 0633  NA 131* 132* 133* 132*  K 4.2 4.7 3.5 3.3*  CL 103 108 109 109  CO2 20* 18* 20* 17*  GLUCOSE 76 92 111* 90  BUN 19 34* 30* 24*  CREATININE 1.18 1.54* 1.19 1.21  CALCIUM 7.7* 7.6* 7.7* 7.6*  MG  --   --   --  1.9   GFR: Estimated Creatinine Clearance: 51.2 mL/min (by C-G formula based on SCr of 1.21 mg/dL). Liver Function Tests: Recent Labs  Lab 09/30/18 0554 10/01/18 0617 10/02/18 0534 10/03/18 0633  AST 85* 80* 67* 68*  ALT 39 33 31 31  ALKPHOS 124 99 84 94  BILITOT 6.1* 4.3* 3.0* 2.6*  PROT 7.3 6.3* 5.6* 6.0*  ALBUMIN 2.0* 1.7* 2.1* 1.9*   Coagulation Profile: Recent Labs  Lab 09/30/18 0554 10/01/18 0617  INR 1.95 2.37   Urine analysis:    Component Value Date/Time   COLORURINE AMBER (A) 09/30/2018 0726   APPEARANCEUR CLEAR 09/30/2018 0726   LABSPEC 1.025 09/30/2018 0726   PHURINE 5.0 09/30/2018 0726   GLUCOSEU NEGATIVE 09/30/2018 0726   HGBUR NEGATIVE 09/30/2018 0726   BILIRUBINUR SMALL (A) 09/30/2018 0726   KETONESUR NEGATIVE 09/30/2018 0726   PROTEINUR NEGATIVE 09/30/2018 0726   UROBILINOGEN >=8.0 05/04/2017 0914   NITRITE NEGATIVE 09/30/2018 0726   LEUKOCYTESUR NEGATIVE 09/30/2018 0726     Vernell Leep, MD, FACP, FHM. [facility] Pager [phone]  If 7PM-7AM, please contact night-coverage [email or web address] Password [facility] 10/03/2018, 4:48 PM

## 2018-10-03 NOTE — Care Management Important Message (Signed)
Important Message  Patient Details  Name: Peter Nelson MRN: [id number] Date of Birth: Jun 01, 1951   Medicare Important Message Given:  Yes    Kerin Salen 10/03/2018, 1:34 PMImportant Message  Patient Details  Name: Peter Nelson MRN: [id number] Date of Birth: Nov 12, 1950   Medicare Important Message Given:  Yes    Kerin Salen 10/03/2018, 1:34 PM

## 2018-10-03 NOTE — Progress Notes (Signed)
Daily Progress Note   Patient Name: Peter Nelson       Date: 10/03/2018 DOB: 01-07-1951  Age: 67 y.o. MRN#: [id number] Attending Physician: Modena Jansky, MD Primary Care Physician: Lanae Boast, FNP Admit Date: 09/30/2018  Reason for Consultation/Follow-up: Establishing goals of care  Subjective:  Patient is resting in bed, he denies any pain.  There is no family at bedside See below:  Length of Stay: 3  Current Medications: Scheduled Meds:  . Chlorhexidine Gluconate Cloth  6 each Topical Q0600  . ferrous sulfate  325 mg Oral Q breakfast  . metoCLOPramide  10 mg Oral TID AC  . mupirocin ointment  1 application Nasal BID  . nadolol  20 mg Oral Daily  . nicotine  14 mg Transdermal Daily  . pantoprazole  40 mg Oral Daily  . potassium chloride  40 mEq Oral Once  . pyridOXINE  100 mg Oral Daily  . spironolactone  50 mg Oral Daily  . sucralfate  1 g Oral TID WC & HS  . thiamine  100 mg Oral Daily  . vitamin C  500 mg Oral Daily  . vitamin E  400 Units Oral Daily    Continuous Infusions: . vancomycin 750 mg (10/03/18 1049)    PRN Meds: ondansetron **OR** ondansetron (ZOFRAN) IV, oxyCODONE, polyethylene glycol  Physical Exam         Frail appearing gentleman Abdomen is distended Clear breath sounds  S 1 S 2  No edema Flat affect   Vital Signs: BP 111/73 (BP Location: Left Arm)   Pulse 76   Temp 98.9 F (37.2 C) (Oral)   Resp 15   Ht 5' 6"  (1.676 m)   Wt 61.1 kg   SpO2 100%   BMI 21.74 kg/m  SpO2: SpO2: 100 % O2 Device: O2 Device: Room Air O2 Flow Rate:    Intake/output summary:   Intake/Output Summary (Last 24 hours) at 10/03/2018 1743 Last data filed at 10/03/2018 1400 Gross per 24 hour  Intake 720 ml  Output -  Net 720 ml   LBM: Last BM  Date: 10/02/18 Baseline Weight: Weight: 66.2 kg Most recent weight: Weight: 61.1 kg       Palliative Assessment/Data: PPS 30%   Flowsheet Rows     Most Recent Value  Intake Tab  Referral Department  Hospitalist  Unit at Time of Referral  -- [urology/telementry]  Palliative Care Primary Diagnosis  Cancer  Date Notified  09/30/18  Palliative Care Type  New Palliative care  Reason for referral  Clarify Goals of Care  Date of Admission  09/30/18  Date first seen by Palliative Care  10/01/18  # of days Palliative referral response time  1 Day(s)  # of days IP prior to Palliative referral  0  Clinical Assessment  Psychosocial & Spiritual Assessment  Palliative Care Outcomes      Patient Active Problem List   Diagnosis Date Noted  . Sudden onset of severe abdominal pain   . Palliative care by specialist   . Goals of care, counseling/discussion   . Sepsis ([place]) 09/30/2018  . Decompensated liver disease ([place]) 09/30/2018  . Duodenal nodule   . Symptomatic anemia 09/05/2018  . Bleeding esophageal varices ([place]) 08/23/2018  . History of hepatitis C 08/23/2018  . HTN (hypertension) 08/23/2018  . BPH (benign prostatic hyperplasia) 08/23/2018  . Hematemesis 08/08/2018  . Mallory-Weiss tear   . Acute GI bleeding 08/07/2018  . Acute hypokalemia 01/12/2017  . Acute upper GI bleeding 12/29/2016  . Esophageal varices in alcoholic cirrhosis ([place])   . Ascites 08/21/202018  . Hemorrhage of esophageal varices in alcoholic cirrhosis ([place])   . UGIB (upper gastrointestinal bleed)   . Acute blood loss anemia 10/25/2016  . Enlarged prostate on rectal examination 10/01/2016  . Erectile dysfunction 10/01/2016  . Abscess of right lower leg 03/15/2016  . Eczema 01/26/2016  . Hepatocellular carcinoma ([place]) 11/13/2015  . Memory loss 11/10/2015  . Alcoholic cirrhosis of liver ([place]) 11/10/2015  . Hepatitis, viral 11/10/2015  . Chronic hepatitis C without hepatic coma ([place]) 09/17/2015  . Stress at  home 08/26/2015  . Alcohol abuse 07/28/2015  . Tobacco use disorder 07/28/2015  . Syncope 05/08/2015    Palliative Care Assessment & Plan   Patient Profile:     Assessment:  [name] a67 y.o.male,with history of alcoholic liver cirrhosis with decompensation, hep C with hepatocellular carcinoma, recent hospitalizations x2 past month with upper GI bleed secondary to bleeding esophageal varices requiring banding who was recently started on sorafenib by his oncologist (10 days back) presented to the hospital with abdominal pain. He has been admitted with sepsis, decompensated liver disease, esophageal varices, he has life limiting illness of hepatocellular carcinoma.    A palliative consult has been requested for goals of care discussions.   Recommendations/Plan: 1. DNR/DNI 2. Continue current care plan.  He reports no concerns or complaints today. 3. Recommend SNF rehab with palliative on discharge.  Please include this on the discharge summary if you agree this is appropriate.   Code Status:    Code Status Orders  (From admission, onward)         Start     Ordered   10/02/18 1249  Do not attempt resuscitation (DNR)  Continuous    Question Answer Comment  In the event of cardiac or respiratory ARREST Do not call a "code blue"   In the event of cardiac or respiratory ARREST Do not perform Intubation, CPR, defibrillation or ACLS   In the event of cardiac or respiratory ARREST Use medication by any route, position, wound care, and other measures to relive pain and suffering. May use oxygen, suction and manual treatment of airway obstruction as needed for comfort.      10/02/18 1248        Code Status History  Date Active Date Inactive Code Status Order ID Comments User Context   09/30/2018 0937 10/02/2018 1248 Full Code [id number]  Louellen Molder, MD Inpatient   09/05/2018 1955 09/07/2018 1603 Full Code [id number]  Vianne Bulls, MD ED   08/22/2018 2342  08/26/2018 1834 Full Code [id number]  Shela Leff, MD ED   08/07/2018 2112 08/10/2018 2212 Full Code [id number]  Rise Patience, MD ED   01/11/2017 1456 01/12/2017 2103 Full Code [id number]  Caren Griffins, MD Inpatient   12/29/2016 2014 01/01/2017 1525 Full Code [id number]  Vianne Bulls, MD ED   10/25/2016 2347 10/30/2016 1942 Full Code [id number]  Rise Patience, MD ED   10/25/2016 2249 10/25/2016 2346 Full Code [id number]  Varney Biles, MD ED   03/15/2016 2043 03/16/2016 1451 Full Code [id number]  Etta Quill, DO ED    Advance Directive Documentation     Most Recent Value  Type of Advance Directive  Healthcare Power of Attorney  Pre-existing out of facility DNR order (yellow form or pink MOST form)  -  "MOST" Form in Place?  -       Prognosis:   Unable to determine  Discharge Planning:  To Be Determined  Care plan was discussed with   Patient  Thank you for allowing the Palliative Medicine Team to assist in the care of this patient.   Total Time  20  Prolonged Time Billed  no       Greater than 50%  of this time was spent counseling and coordinating care related to the above assessment and plan.  Micheline Rough, MD  Please contact Palliative Medicine Team phone at [phone] for questions and concerns.

## 2018-10-04 ENCOUNTER — Other Ambulatory Visit: Payer: Self-pay | Admitting: *Deleted

## 2018-10-04 DIAGNOSIS — C220 Liver cell carcinoma: Secondary | ICD-10-CM

## 2018-10-04 DIAGNOSIS — E43 Unspecified severe protein-calorie malnutrition: Secondary | ICD-10-CM

## 2018-10-04 LAB — CBC
HCT: 27.6 % — ABNORMAL LOW (ref 39.0–52.0)
Hemoglobin: 8.9 g/dL — ABNORMAL LOW (ref 13.0–17.0)
MCH: 30.4 pg (ref 26.0–34.0)
MCHC: 32.2 g/dL (ref 30.0–36.0)
MCV: 94.2 fL (ref 80.0–100.0)
PLATELETS: 129 10*3/uL — AB (ref 150–400)
RBC: 2.93 MIL/uL — ABNORMAL LOW (ref 4.22–5.81)
RDW: 17.1 % — ABNORMAL HIGH (ref 11.5–15.5)
WBC: 5.8 10*3/uL (ref 4.0–10.5)
nRBC: 0 % (ref 0.0–0.2)

## 2018-10-04 LAB — BASIC METABOLIC PANEL
Anion gap: 5 (ref 5–15)
BUN: 25 mg/dL — AB (ref 8–23)
CO2: 20 mmol/L — ABNORMAL LOW (ref 22–32)
Calcium: 7.8 mg/dL — ABNORMAL LOW (ref 8.9–10.3)
Chloride: 107 mmol/L (ref 98–111)
Creatinine, Ser: 1.19 mg/dL (ref 0.61–1.24)
GFR calc Af Amer: >60 mL/min (ref >60)
GFR calc non Af Amer: >60 mL/min (ref >60)
GLUCOSE: 111 mg/dL — AB (ref 70–99)
Potassium: 3.4 mmol/L — ABNORMAL LOW (ref 3.5–5.1)
Sodium: 132 mmol/L — ABNORMAL LOW (ref 135–145)

## 2018-10-04 LAB — MAGNESIUM: Magnesium: 1.8 mg/dL (ref 1.7–2.4)

## 2018-10-04 MED ORDER — POTASSIUM CHLORIDE CRYS ER 20 MEQ PO TBCR
40.0000 meq | EXTENDED_RELEASE_TABLET | Freq: Once | ORAL | Status: DC
  Filled 2018-10-04: qty 2

## 2018-10-04 MED ORDER — ENSURE ENLIVE PO LIQD: 237.0000 mL | Freq: Three times a day (TID) | ORAL | Status: DC

## 2018-10-04 MED ORDER — SULFAMETHOXAZOLE-TRIMETHOPRIM 800-160 MG PO TABS: 1.0000 | ORAL_TABLET | Freq: Every day | ORAL | 0 refills | Status: DC

## 2018-10-04 MED ORDER — ADULT MULTIVITAMIN W/MINERALS CH: 1.0000 | ORAL_TABLET | Freq: Every day | ORAL | Status: DC

## 2018-10-04 NOTE — Progress Notes (Signed)
Patient  A&o, ambulatory without assistance at time of DC. Pt ready to DC without further needs per primary RN

## 2018-10-04 NOTE — Progress Notes (Signed)
Initial Nutrition Assessment  DOCUMENTATION CODES:   Severe malnutrition in context of chronic illness  INTERVENTION:    Ensure Enlive po TID, each supplement provides 350 kcal and 20 grams of protein  Provide MVI daily  NUTRITION DIAGNOSIS:   Severe Malnutrition related to cancer and cancer related treatments, chronic illness as evidenced by severe fat depletion, severe muscle depletion.  GOAL:   Patient will meet greater than or equal to 90% of their needs  MONITOR:   PO intake, Supplement acceptance, Weight trends, Labs, I & O's  REASON FOR ASSESSMENT:   Consult Assessment of nutrition requirement/status  ASSESSMENT:   Patient with PMH significant for alcoholic liver cirrhosis with decompensation, esophageal varices, Hep C, HTN, UC, and hepatocellular carcinoma. Had two hospitalization within the last month due to upper GI bleed secondary to bleeding esophogeal varices s/p banding. Presents this admission with severe sepsis with concern for SBP.    Pt denies any loss in appetite PTA. States he typically eats 5-6 six meal daily. When asked what his meals consist of pt states he eats "food." When asked if he eats protein rich foods pt states "I eat food." Pt drinks one Ensure for breakfast and one Ensue at night. Pt unwilling to provide any further dietary history. Pt denies any issues with swallowing but has a hard time using his voice. Meal completions this admission range from 0-100% for his last eight meals (4 meals total 0% completion). Suspect pt has not met energy requirement in prolonged period but unable to quantify given lack of evidence.   Pt endorses a UBW of 138 lb and is unsure if he lost weight recently. Records indicate pt's weight fluctuates from 130-140 lb. Unsure how much is dry wt loss versus fluid fluctuations.  Nutrition-Focused physical exam completed.  Medications reviewed and include: ferrous sulfate, 40 mEq once daily, Vit B6, thiamine, Vit C, Vit  E Labs reviewed: Na 132 (L) K 3.4 (L)   NUTRITION - FOCUSED PHYSICAL EXAM:    Most Recent Value  Orbital Region  Moderate depletion  Upper Arm Region  Severe depletion  Thoracic and Lumbar Region  Unable to assess  Buccal Region  Severe depletion  Clavicle Bone Region  Severe depletion  Clavicle and Acromion Bone Region  Severe depletion  Scapular Bone Region  Unable to assess  Dorsal Hand  Severe depletion  Patellar Region  Severe depletion  Anterior Thigh Region  Severe depletion  Posterior Calf Region  Severe depletion  Edema (RD Assessment)  Mild  Hair  Reviewed  Eyes  Reviewed  Mouth  Reviewed  Skin  Reviewed  Nails  Reviewed     Diet Order:   Diet Order            Diet 2 gram sodium Room service appropriate? Yes; Fluid consistency: Thin  Diet effective now              EDUCATION NEEDS:   Education needs have been addressed  Skin:  Skin Assessment: Reviewed RN Assessment  Last BM:  10/03/18  Height:   Ht Readings from Last 1 Encounters:  09/30/18 5' 6"  (1.676 m)    Weight:   Wt Readings from Last 1 Encounters:  09/30/18 61.1 kg    Ideal Body Weight:  64.5 kg  BMI:  Body mass index is 21.74 kg/m.  Estimated Nutritional Needs:   Kcal:  1950-2150 kcal  Protein:  95-110 grams  Fluid:  >/= 1.9 L/day   Mariana Single RD, LDN Clinical  Nutrition Pager # - [phone]

## 2018-10-04 NOTE — Evaluation (Signed)
Physical Therapy Evaluation Patient Details Name: Peter Nelson MRN: [id number] DOB: December 19, 1950 Today's Date: 10/04/2018   History of Present Illness  67 yo male admitted with sepsis. Hx of ETOH liver cirrhosis, hepatocellular ca, ETOH abuse, Hep C  Clinical Impression  On eval, pt was Supervision level assist for mobility. He walked ~150 feet with a RW. No LOB with RW use. He tolerated activity well.  Pt reported he has a cane and walker at home already. Discussed d/c plan-pt will return home where he lives with his sister. He politely declines HHPT f/u. Will follow during hospital stay if pt remains in hospital.     Follow Up Recommendations No PT follow up;Supervision - Intermittent (pt declines any PT f/u)    Equipment Recommendations  None recommended by PT(pt stated he has "everything I need")    Recommendations for Other Services       Precautions / Restrictions Precautions Precautions: Fall Restrictions Weight Bearing Restrictions: No      Mobility  Bed Mobility Overal bed mobility: Modified Independent                Transfers Overall transfer level: Needs assistance Equipment used: Rolling walker (2 wheeled) Transfers: Sit to/from Stand Sit to Stand: Supervision         General transfer comment: for safety. VCs hand placement  Ambulation/Gait Ambulation/Gait assistance: Supervision Gait Distance (Feet): 150 Feet Assistive device: Rolling walker (2 wheeled) Gait Pattern/deviations: Step-through pattern     General Gait Details: No LOB with RW use. Pt tolerated activity well.   Stairs            Wheelchair Mobility    Modified Rankin (Stroke Patients Only)       Balance Overall balance assessment: Mild deficits observed, not formally tested                                           Pertinent Vitals/Pain Pain Assessment: No/denies pain    Home Living Family/patient expects to be discharged to:: Private  residence Living Arrangements: Other relatives Available Help at Discharge: Family;Friend(s);Available PRN/intermittently Type of Home: House Home Access: Stairs to enter Entrance Stairs-Rails: Right Entrance Stairs-Number of Steps: 4 Home Layout: One level Home Equipment: Cane - single point;Walker - 2 wheels      Prior Function Level of Independence: Independent with assistive device(s)         Comments: uses cane as needed, but does not regularly use AD      Hand Dominance        Extremity/Trunk Assessment   Upper Extremity Assessment Upper Extremity Assessment: Generalized weakness    Lower Extremity Assessment Lower Extremity Assessment: Generalized weakness    Cervical / Trunk Assessment Cervical / Trunk Assessment: Kyphotic  Communication   Communication: No difficulties  Cognition Arousal/Alertness: Awake/alert Behavior During Therapy: WFL for tasks assessed/performed Overall Cognitive Status: Within Functional Limits for tasks assessed                                        General Comments      Exercises     Assessment/Plan    PT Assessment Patient needs continued PT services  PT Problem List Decreased mobility;Decreased balance       PT Treatment Interventions Therapeutic activities;Therapeutic exercise;Patient/family education;Functional  mobility training    PT Goals (Current goals can be found in the Care Plan section)  Acute Rehab PT Goals Patient Stated Goal: home PT Goal Formulation: With patient Time For Goal Achievement: 10/18/18 Potential to Achieve Goals: Good    Frequency Min 3X/week   Barriers to discharge        Co-evaluation               AM-PAC PT "6 Clicks" Mobility  Outcome Measure Help needed turning from your back to your side while in a flat bed without using bedrails?: None Help needed moving from lying on your back to sitting on the side of a flat bed without using bedrails?: None Help  needed moving to and from a bed to a chair (including a wheelchair)?: None Help needed standing up from a chair using your arms (e.g., wheelchair or bedside chair)?: None Help needed to walk in hospital room?: A Little Help needed climbing 3-5 steps with a railing? : A Little 6 Click Score: 22    End of Session Equipment Utilized During Treatment: Gait belt Activity Tolerance: Patient tolerated treatment well Patient left: in bed;with call bell/phone within reach   PT Visit Diagnosis: Unsteadiness on feet (R26.81)    Time: 7505-1071 PT Time Calculation (min) (ACUTE ONLY): 9 min   Charges:   PT Evaluation $PT Eval Moderate Complexity: [place], PT Acute Rehabilitation Services Pager: [phone] Office: [phone]

## 2018-10-04 NOTE — Discharge Summary (Signed)
Physician Discharge Summary  Tiras Threats Segundo UYQ:[id number] DOB: [birth year] DOA: 09/30/2018  PCP: Mike Gip, FNP  Admit date: 09/30/2018 Discharge date: 10/04/2018  Time spent: 40 minutes  Recommendations for Outpatient Follow-up:  1. Follow up outpatient CBC/CMP 2. Ensure follow up with oncology as outpatient 3. Started on bactrim for SBP prophylaxis  4. Continue goals of care converstations with Dr. Truett Perna or palliative (palliative recommended DNR/DNI, palliative to follow at discharge, but pt not going to SNF - will allow pt to continue discussions with Dr. Truett Perna) 5. Follow up with GI as outpatient   Discharge Diagnoses:  Principal Problem:   Sepsis (HCC) Active Problems:   Tobacco use disorder   Chronic hepatitis C without hepatic coma (HCC)   Hepatocellular carcinoma (HCC)   Esophageal varices in alcoholic cirrhosis (HCC)   BPH (benign prostatic hyperplasia)   Symptomatic anemia   Decompensated liver disease (HCC)   Sudden onset of severe abdominal pain   Palliative care by specialist   Goals of care, counseling/discussion   Protein-calorie malnutrition, severe   Discharge Condition: stable  Filed Weights   09/30/18 0428 09/30/18 0727 09/30/18 0934  Weight: 66.2 kg 63 kg 61.1 kg    History of present illness:  [name] a67 y.o.male,with medical history significant for but not limited to alcoholic liver cirrhosis with decompensation, hep C with hepatocellular carcinoma, recent hospitalizations x2 past month with upper GI bleed secondary to bleeding esophageal varices requiring banding who was recently started on sorafenib by his oncologist (10 days back) presenting with abdominal pain of 2 days duration associated with fever and elevated LFTs.  Patient is admitted to the hospital for sepsis and SBP.  He was admitted with abdominal pain and fever as well as elevated LFT's.  His workup was notable for SBP with ascitic fluid growing MRSA.  He's now s/p  5 days of abx for SBP and his symptoms are improved.  The case was discussed with ID who did not recommend any additional duration of abx or workup at this time, but agreed with prophylaxis with bactrim.  He was discharged on 12/18 with plans to follow up with oncology.  Hospital Course:  Severe sepsis (HCC) due to SBP Given abdominal pain and distention with ascites and recent bleeding esophageal varices, Diagnostic paracentesis done in the ED. Highly suggestive of SBP, WBC 9K, predominantly (96%) neutrophils. Ascitic fluid culture grew MRSA He's now s/p 5 days of vancomycin Case was discussed with ID on call on 12/18 who did not recommend any additional evaluation or any extended duration of abx (in setting of his negative blood cx).  They agreed with starting Mr. Reome on bactrim for SBP prophylaxis.  Abdominal pain better. MRSA SBP could be related to seeding from repeated taps.  Decompensated liver diseasewith bleeding esophageal varices(HCC) Combination of alcoholic liver disease and hep C with hepatocellular carcinoma.  Recent hospitalization for GI bleed with esophageal varices that was banded about 5 weeks back.  He was rehospitalized with melena and hematemesis 3 weeks ago and again underwent EGD showing many superficial nonbleeding esophageal ulcer and nonbleeding esophageal varices, portal hypertensive gastropathy and nonbleeding duodenal erosions.  GI recommended for twice daily PPI (now daily) and Carafate. Patient denies any hematemesis or melena at this time.  Continue propanolol and Aldactone. Total bilirubin has elevated (improving prior to d/c). Monitor hematologic indicis and LFTs Status post 4.6 L therapeutic paracentesis done by IR on 12/15.  Hepatocellular carcinoma  Chronic hepatitis C without hepatic coma Being followed by Dr.Sherill-  deemed not surgical candidate candidate - on sorafenib. Discussed with Dr. Truett Perna on discharge who notes sorafenib can  be resumed. Planning to follow up as an outpatient.  Dr. Truett Perna will discuss goals of care with Mr. Brazzel at discharge.   Esophageal varices in alcoholic cirrhosis (HCC) Stable  continue twice daily PPI and Carafate No overt bleeding reported.  Abdominal pain On chronic oxycodone at home which is continued.  Secondary to SBP.  Improving.  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  Follow CBCs.  Stable.  Acute kidney injury:  Improved, follow  Tobacco use disorder Counseled strongly on cessation. Nicotine patch applied  Adult failure to thrive PMT input appreciated.  Hypokalemia Replaced.  Follow BMP.  Procedures:  Abdominal paracentesis by ED physician  Therapeutic paracentesis on 12/15 by IR   Consultations:  Palliative Care  IR  Discharge Exam: Vitals:   10/04/18 0342 10/04/18 1301  BP: 126/78 136/82  Pulse: 81 81  Resp: 14 12  Temp: 99 F (37.2 C) 98 F (36.7 C)  SpO2: 99% 99%   Asking about going home.   Feels better.  General: No acute distress. Cardiovascular: Heart sounds show Taffie Eckmann regular rate, and rhythm. . Lungs: Clear to auscultation bilaterally  Abdomen: disteneded, but nontender Neurological: Alert and oriented 3. Moves all extremities 4. Cranial nerves II through XII grossly intact. Skin: Warm and dry. No rashes or lesions. Extremities: No clubbing or cyanosis. No edema.  Psychiatric: Mood and affect are normal. Insight and judgment are appropriate.  Discharge Instructions   Discharge Instructions    Call MD for:  difficulty breathing, headache or visual disturbances   Complete by:  As directed    Call MD for:  extreme fatigue   Complete by:  As directed    Call MD for:  persistant dizziness or light-headedness   Complete by:  As directed    Call MD for:  persistant nausea and vomiting   Complete by:  As directed    Call MD for:  redness, tenderness, or signs of  infection (pain, swelling, redness, odor or green/yellow discharge around incision site)   Complete by:  As directed    Call MD for:  severe uncontrolled pain   Complete by:  As directed    Call MD for:  temperature >100.4   Complete by:  As directed    Diet - low sodium heart healthy   Complete by:  As directed    Discharge instructions   Complete by:  As directed    You were seen for spontaneous bacterial peritonitis.  We've treated you with IV antibiotics.    You will start bactrim daily when you go home.  This is to help prevent recurrent episodes of peritonitis.  Please follow up with Dr. Truett Perna and your PCP within Sarika Baldini few days.  Return for new, recurrent, or worsening symptoms.  Please ask your PCP to request records from this hospitalization so they know what was done and what the next steps will be.   Increase activity slowly   Complete by:  As directed      Allergies as of 10/04/2018      Reactions   Aspirin Other (See Comments)   Does not take because of Hep C       Medication List    TAKE these medications   Ferrous Sulfate 27 MG Tabs Take 27 mg by mouth daily.   metoCLOPramide 10 MG tablet Commonly known as:  REGLAN Take 10 mg by mouth 3 (three) times daily before meals.   nadolol 20 MG tablet Commonly known as:  CORGARD Take 1 tablet (20 mg total) by mouth daily.   ondansetron 8 MG tablet Commonly known as:  ZOFRAN Take 1 tablet (8 mg total) by mouth 2 (two) times daily as needed for nausea or vomiting.   oxyCODONE 5 MG immediate release tablet Commonly known as:  Oxy IR/ROXICODONE Take 1 tablet (5 mg total) by mouth 2 (two) times daily as needed for severe pain.   pantoprazole 40 MG tablet Commonly known as:  PROTONIX Take 1 tablet (40 mg total) by mouth 2 (two) times daily for 14 days, THEN 1 tablet (40 mg total) daily. Start taking on:  September 07, 2018   polyethylene glycol packet Commonly known as:  MIRALAX / GLYCOLAX Take 17 g by mouth  daily. What changed:    when to take this  reasons to take this   pyridOXINE 100 MG tablet Commonly known as:  VITAMIN B-6 Take 100 mg by mouth daily.   SORAfenib 200 MG tablet Commonly known as:  NEXAVAR Take 1 tablet (200 mg total) by mouth at bedtime. Give on an empty stomach 1 hour before or 2 hours after meals.   spironolactone 50 MG tablet Commonly known as:  ALDACTONE Take 1 tablet (50 mg total) by mouth daily.   sucralfate 1 GM/10ML suspension Commonly known as:  CARAFATE Take 1 g by mouth 4 (four) times daily -  with meals and at bedtime.   sulfamethoxazole-trimethoprim 800-160 MG tablet Commonly known as:  BACTRIM DS,SEPTRA DS Take 1 tablet by mouth daily.   thiamine 100 MG tablet Take 1 tablet (100 mg total) by mouth daily.   vitamin C 500 MG tablet Commonly known as:  ASCORBIC ACID Take 500 mg by mouth daily.   vitamin E 400 UNIT capsule Take 400 Units by mouth daily.      Allergies  Allergen Reactions  . Aspirin Other (See Comments)    Does not take because of Hep C       The results of significant diagnostics from this hospitalization (including imaging, microbiology, ancillary and laboratory) are listed below for reference.    Significant Diagnostic Studies: Dg Chest 2 View  Result Date: 09/30/2018 CLINICAL DATA:  Rule out sepsis.  Fever. EXAM: CHEST - 2 VIEW COMPARISON:  Radiographs 09/12/2018 FINDINGS: The cardiomediastinal contours are normal. Lung volumes are low. Improved bibasilar atelectasis with probable scarring at the right lung base. Pulmonary vasculature is normal. No consolidation, pleural effusion, or pneumothorax. No acute osseous abnormalities are seen. IMPRESSION: Low lung volumes. No confluent airspace disease to suggest pneumonia. Improved bibasilar atelectasis since last month with residual atelectasis versus scarring at the right lung base. Electronically Signed   By: Narda Rutherford M.D.   On: 09/30/2018 05:54   Dg Chest 2  View  Result Date: 09/12/2018 CLINICAL DATA:  Chest pain and peripheral edema since endoscopy last week. History of liver malignancy and recent GI bleed. No cough, shortness of breath, or chest congestion. Current smoker. EXAM: CHEST - 2 VIEW COMPARISON:  Chest x-ray of August 07, 2018 FINDINGS: The lungs are adequately inflated. The interstitial markings are mildly increased. The heart is top-normal in size. The pulmonary vascularity is not clearly engorged. There is calcification in the wall of the aortic arch. The bony thorax exhibits no acute abnormality. IMPRESSION: Coarse lung markings at both bases suggests subsegmental atelectasis. No definite pneumonia nor pulmonary  edema. Thoracic aortic atherosclerosis. Electronically Signed   By: David  Swaz[place]and M.D.   On: 09/12/2018 10:00   US Paracentesis  Result Date: 10/01/2018 INDICATION: Patient with history of hepatocellular carcinoma, cirrhosis, hepatitis-C, recurrent ascites. Request made for therapeutic paracentesis. EXAM: ULTRASOUND GUIDED THERAPEUTIC PARACENTESIS MEDICATIONS: None COMPLICATIONS: None immediate. PROCEDURE: Informed written consent was obtained from the patient after Shauntae Reitman discussion of the risks, benefits and alternatives to treatment. Jeanpierre Thebeau timeout was performed prior to the initiation of the procedure. Initial ultrasound scanning demonstrates Ebb Carelock moderate to large amount of ascites within the right mid to lower abdominal quadrant. The right mid to lower abdomen was prepped and draped in the usual sterile fashion. 1% lidocaine was used for local anesthesia. Following this, Fraya Ueda 19 gauge, 7-cm, Yueh catheter was introduced. An ultrasound image was saved for documentation purposes. The paracentesis was performed. The catheter was removed and Dennette Faulconer dressing was applied. The patient tolerated the procedure well without immediate post procedural complication. FINDINGS: Jeriah Skufca total of approximately 4.6 liters of hazy, yellow fluid was removed. IMPRESSION:  Successful ultrasound-guided therapeutic paracentesis yielding 4.6 liters liters of peritoneal fluid. Read by: Jeananne Rama, PA-C Electronically Signed   By: Simonne Come M.D.   On: 10/01/2018 11:22   US Paracentesis  Result Date: 09/20/2018 INDICATION: Patient with history of hepatocellular carcinoma, esophageal varices with prior banding, cirrhosis, hepatitis-C, recurrent ascites. Request made for therapeutic paracentesis up to 5 liters. EXAM: ULTRASOUND GUIDED THERAPEUTIC PARACENTESIS MEDICATIONS: None COMPLICATIONS: None immediate. PROCEDURE: Informed written consent was obtained from the patient after Luetta Piazza discussion of the risks, benefits and alternatives to treatment. Nadirah Socorro timeout was performed prior to the initiation of the procedure. Initial ultrasound scanning demonstrates Gagan Dillion large amount of ascites within the left mid to lower abdominal quadrant. The left mid to lower abdomen was prepped and draped in the usual sterile fashion. 1% lidocaine was used for local anesthesia. Following this, Fredrich Cory 19 gauge, 7-cm, Yueh catheter was introduced. An ultrasound image was saved for documentation purposes. The paracentesis was performed. The catheter was removed and Saragrace Selke dressing was applied. The patient tolerated the procedure well without immediate post procedural complication. FINDINGS: Mable Dara total of approximately 5 liters of clear, yellow fluid was removed. IMPRESSION: Successful ultrasound-guided therapeutic paracentesis yielding 5 liters of peritoneal fluid. Read by: Jeananne Rama, PA-C Electronically Signed   By: Judie Petit.  Shick M.D.   On: 09/20/2018 16:55   US Paracentesis  Result Date: 09/12/2018 INDICATION: Patient with history of hepatocellular carcinoma, esophageal varices with prior banding, cirrhosis, hepatitis-C, recurrent ascites. Request made for therapeutic paracentesis. EXAM: ULTRASOUND GUIDED THERAPEUTIC PARACENTESIS MEDICATIONS: None COMPLICATIONS: None immediate. PROCEDURE: Informed written consent was obtained  from the patient after Breah Joa discussion of the risks, benefits and alternatives to treatment. Eean Buss timeout was performed prior to the initiation of the procedure. Initial ultrasound scanning demonstrates Noami Bove amount of ascites within the right upper to mid abdominal quadrant. The right upper to mid abdomen was prepped and draped in the usual sterile fashion. 1% lidocaine was used for local anesthesia. Following this, Kellin Bartling 19 gauge, 7-cm, Yueh catheter was introduced. An ultrasound image was saved for documentation purposes. The paracentesis was performed. The catheter was removed and Yoseph Haile dressing was applied. The patient tolerated the procedure well without immediate post procedural complication. FINDINGS: Tylisa Alcivar total of approximately 4.1 liters of yellow fluid was removed. IMPRESSION: Successful ultrasound-guided therapeutic paracentesis yielding 4.1 liters of peritoneal fluid. Read by: Jeananne Rama, PA-C Electronically Signed   By: Jolaine Click M.D.   On:  09/12/2018 14:07    Microbiology: Recent Results (from the past 240 hour(s))  Culture, blood (Routine x 2)     Status: None (Preliminary result)   Collection Time: 09/30/18  5:54 AM  Result Value Ref Range Status   Specimen Description   Final    BLOOD RIGHT WRIST Performed at [facility], [address], [place], [place] [address]    Special Requests   Final    BOTTLES DRAWN AEROBIC AND ANAEROBIC Blood Culture adequate volume Performed at [facility], [address], [place], [place] [address]    Culture   Final    NO GROWTH 4 DAYS Performed at [facility], [address] [place], [place] [address]    Report Status PENDING  Incomplete  Culture, blood (Routine x 2)     Status: None (Preliminary result)   Collection Time: 09/30/18  5:54 AM  Result Value Ref Range Status   Specimen Description   Final    BLOOD BLOOD RIGHT FOREARM Performed at [facility], [address], [place], [place]  [address]    Special Requests   Final    BOTTLES DRAWN AEROBIC AND ANAEROBIC Blood Culture adequate volume Performed at [facility], [address], [place], [place] [address]    Culture   Final    NO GROWTH 4 DAYS Performed at [facility], [address] [place], [place] [address]    Report Status PENDING  Incomplete  Urine culture     Status: None   Collection Time: 09/30/18  7:26 AM  Result Value Ref Range Status   Specimen Description   Final    URINE, RANDOM Performed at [facility], [address], [place], [place] [address]    Special Requests   Final    NONE Performed at [facility], [address] [place], [place] [address]    Culture   Final    NO GROWTH Performed at [facility], [address], [place], [place] [address]    Report Status 10/01/2018 FINAL  Final  Body fluid culture     Status: None   Collection Time: 09/30/18  8:18 AM  Result Value Ref Range Status   Specimen Description   Final    PERITONEAL CAVITY Performed at [facility] [address] [place], [place] [address]    Special Requests   Final    Immunocompromised Performed at [facility], [address], [place], [place] [address]    Gram Stain   Final    ABUNDANT WBC PRESENT, PREDOMINANTLY PMN NO ORGANISMS SEEN    Culture   Final    FEW METHICILLIN RESISTANT STAPHYLOCOCCUS AUREUS CRITICAL RESULT CALLED TO, READ BACK BY AND VERIFIED WITH: Sherrye Payor RN, AT 1123 10/01/18 BY D. VANHOOK REGARDING CULTURE GROWTH Performed at [facility], [address] [place], [place] [address]    Report Status 10/02/2018 FINAL  Final   Organism ID, Bacteria METHICILLIN RESISTANT STAPHYLOCOCCUS AUREUS  Final      Susceptibility   Methicillin resistant staphylococcus aureus - MIC*    CIPROFLOXACIN >=8 RESISTANT Resistant     ERYTHROMYCIN >=8 RESISTANT Resistant     GENTAMICIN <=0.5  SENSITIVE Sensitive     OXACILLIN >=4 RESISTANT Resistant     TETRACYCLINE <=1 SENSITIVE Sensitive     VANCOMYCIN <=0.5 SENSITIVE Sensitive     TRIMETH/SULFA <=10 SENSITIVE Sensitive  CLINDAMYCIN <=0.25 SENSITIVE Sensitive     RIFAMPIN <=0.5 SENSITIVE Sensitive     Inducible Clindamycin NEGATIVE Sensitive     * FEW METHICILLIN RESISTANT STAPHYLOCOCCUS AUREUS  MRSA PCR Screening     Status: Abnormal   Collection Time: 09/30/18 12:12 PM  Result Value Ref Range Status   MRSA by PCR POSITIVE (Viliami Bracco) NEGATIVE Final    Comment: RESULT CALLED TO, READ BACK BY AND VERIFIED WITH: BULLINS,H RN [phone] C[place],N        The GeneXpert MRSA Assay (FDA approved for NASAL specimens only), is one component of Dominque Marlin comprehensive MRSA colonization surveillance program. It is not intended to diagnose MRSA infection nor to guide or monitor treatment for MRSA infections. Performed at [facility], [address], [place], [place] [address]      Labs: Basic Metabolic Panel: Recent Labs  Lab 09/30/18 0554 10/01/18 0617 10/02/18 0534 10/03/18 0633 10/04/18 0613  NA 131* 132* 133* 132* 132*  K 4.2 4.7 3.5 3.3* 3.4*  CL 103 108 109 109 107  CO2 20* 18* 20* 17* 20*  GLUCOSE 76 92 111* 90 111*  BUN 19 34* 30* 24* 25*  CREATININE 1.18 1.54* 1.19 1.21 1.19  CALCIUM 7.7* 7.6* 7.7* 7.6* 7.8*  MG  --   --   --  1.9 1.8   Liver Function Tests: Recent Labs  Lab 09/30/18 0554 10/01/18 0617 10/02/18 0534 10/03/18 0633  AST 85* 80* 67* 68*  ALT 39 33 31 31  ALKPHOS 124 99 84 94  BILITOT 6.1* 4.3* 3.0* 2.6*  PROT 7.3 6.3* 5.6* 6.0*  ALBUMIN 2.0* 1.7* 2.1* 1.9*   No results for input(s): LIPASE, AMYLASE in the last 168 hours. No results for input(s): AMMONIA in the last 168 hours. CBC: Recent Labs  Lab 09/30/18 0554 10/01/18 0617 10/02/18 0534 10/03/18 0633 10/04/18 0613  WBC 8.0 9.0 8.6 7.0 5.8  NEUTROABS 7.1  --   --   --   --   HGB 10.2* 9.4* 8.1* 8.5* 8.9*   HCT 32.3* 29.2* 25.0* 26.3* 27.6*  MCV 93.9 95.1 92.9 92.6 94.2  PLT 156 138* 103* 112* 129*   Cardiac Enzymes: No results for input(s): CKTOTAL, CKMB, CKMBINDEX, TROPONINI in the last 168 hours. BNP: BNP (last 3 results) Recent Labs    09/12/18 1029  BNP 311.9*    ProBNP (last 3 results) No results for input(s): PROBNP in the last 8760 hours.  CBG: No results for input(s): GLUCAP in the last 168 hours.     Signed:  Lacretia Nicks MD.  [facility] 10/04/2018, 2:21 PM

## 2018-10-04 NOTE — Progress Notes (Signed)
Pharmacy Antibiotic Note  Peter Nelson is a 67 y.o. male with metastatic liver cancer on sorafenib PTA, presented to the ED on 09/30/2018 with c/o fever and abd pain.  Patient's currently on Vancomycin for MRSA SBP.  Today, 10/04/2018: - D5 Vancomycin - afebrile, wbc wnl - Renal function stable  Plan: - Continue Vancomycin 750 mg IV q24h for goal AUC 400-500 - Consider d/c antibiotics once patient clinically improved (noted plan to d/w ID) _______________________________  Height: 5' 6"  (167.6 cm) Weight: 134 lb 11.2 oz (61.1 kg) IBW/kg (Calculated) : 63.8  Temp (24hrs), Avg:99 F (37.2 C), Min:98.9 F (37.2 C), Max:99.1 F (37.3 C)  Recent Labs  Lab 09/30/18 0554 09/30/18 0610 09/30/18 1625 10/01/18 0617 10/02/18 0534 10/03/18 0633 10/04/18 0613  WBC 8.0  --   --  9.0 8.6 7.0 5.8  CREATININE 1.18  --   --  1.54* 1.19 1.21 1.19  LATICACIDVEN  --  3.02* 3.5*  --   --   --   --     Estimated Creatinine Clearance: 52.1 mL/min (by C-G formula based on SCr of 1.19 mg/dL).    Allergies  Allergen Reactions  . Aspirin Other (See Comments)    Does not take because of Hep C    Antimicrobials this admission: 12/14 cefepime >> 12/16 12/14 vancomycin >> 12/14 flagyl >>12/14  Dose adjustments this admission:  Microbiology results: 12/14 BCx x2: NGTD 12/14 UCx: NGF 12/14 peritoneal cavity: few staph - MRSA 12/14 MRSA PCR: positive    Thank you for allowing pharmacy to be a part of this patient's care.  Biagio Borg 10/04/2018 10:57 AM

## 2018-10-05 ENCOUNTER — Other Ambulatory Visit: Payer: Medicare HMO

## 2018-10-05 LAB — CULTURE, BLOOD (ROUTINE X 2)
CULTURE: NO GROWTH
Culture: NO GROWTH
Special Requests: ADEQUATE
Special Requests: ADEQUATE

## 2018-10-06 ENCOUNTER — Telehealth: Payer: Self-pay | Admitting: *Deleted

## 2018-10-06 DIAGNOSIS — K7031 Alcoholic cirrhosis of liver with ascites: Secondary | ICD-10-CM

## 2018-10-06 DIAGNOSIS — C220 Liver cell carcinoma: Secondary | ICD-10-CM

## 2018-10-06 NOTE — Telephone Encounter (Signed)
Sister, Dub Mikes called 2nd time and reported that patient needs a paracentesis. His abdomen is swelling again. Should he go to the emergency room? OK for paracentesis per Dr. Benay Spice. Called sister back and gave appointment for paracentesis on 12/23 at 1045/1100 at [facility]. She thinks he will be able to wait until then (this was first available). She also reports he is getting more confused-tries to leave the house and turn on the stove. Encouraged her to lock doors and take knobs off stove. Made her aware of his appointment on 12/26 at 0800 for lab/OV here. Will make MD aware of the increased confusion.

## 2018-10-09 ENCOUNTER — Telehealth: Payer: Self-pay | Admitting: *Deleted

## 2018-10-09 ENCOUNTER — Ambulatory Visit (HOSPITAL_COMMUNITY)
Admission: RE | Admit: 2018-10-09 | Discharge: 2018-10-09 | Disposition: A | Discharge status: Home / Self Care | Payer: Medicare HMO | Source: Ambulatory Visit | Attending: Oncology | Admitting: Oncology

## 2018-10-09 DIAGNOSIS — K7031 Alcoholic cirrhosis of liver with ascites: Secondary | ICD-10-CM

## 2018-10-09 DIAGNOSIS — C220 Liver cell carcinoma: Secondary | ICD-10-CM | POA: Insufficient documentation

## 2018-10-09 DIAGNOSIS — R188 Other ascites: Secondary | ICD-10-CM | POA: Diagnosis not present

## 2018-10-09 MED ORDER — LIDOCAINE HCL 1 % IJ SOLN
INTRAMUSCULAR | Status: AC
  Filled 2018-10-09: qty 10

## 2018-10-09 NOTE — Procedures (Signed)
PROCEDURE SUMMARY:  Successful image-guided paracentesis from the left lower abdomen.  Yielded 5.8 liters of clear yellow fluid.  No immediate complications.  EBL: zero Patient tolerated well.   Specimen was not sent for labs.  Please see imaging section of Epic for full dictation.  Joaquim Nam PA-C 10/09/2018 11:22 AM

## 2018-10-09 NOTE — Telephone Encounter (Addendum)
Left VM that he had chest pain and his feet and legs are very swollen.

## 2018-10-09 NOTE — Telephone Encounter (Signed)
Called sister back and she reports the chest pain was over the weekend and he is not having any today. Concerned his feet and legs are very swollen. Informed her that is due to the pressure from his liver/ascites not allowing venous return from legs. Encouraged her to have him elevate legs above his heart as much as possible. Hopefully the paracentesis today will help as well. She confirmed that he is getting the spironolactone every day.

## 2018-10-12 ENCOUNTER — Other Ambulatory Visit: Payer: Self-pay | Admitting: Oncology

## 2018-10-12 ENCOUNTER — Inpatient Hospital Stay (HOSPITAL_BASED_OUTPATIENT_CLINIC_OR_DEPARTMENT_OTHER): Payer: Medicare HMO | Admitting: Oncology

## 2018-10-12 ENCOUNTER — Telehealth: Payer: Self-pay | Admitting: *Deleted

## 2018-10-12 ENCOUNTER — Inpatient Hospital Stay: Payer: Medicare HMO

## 2018-10-12 ENCOUNTER — Telehealth: Payer: Self-pay | Admitting: General Practice

## 2018-10-12 ENCOUNTER — Telehealth: Payer: Self-pay | Admitting: Oncology

## 2018-10-12 VITALS — BP 129/80 | HR 74 | Temp 97.5°F | Resp 17 | Ht 66.0 in | Wt 123.3 lb

## 2018-10-12 DIAGNOSIS — C220 Liver cell carcinoma: Secondary | ICD-10-CM | POA: Diagnosis not present

## 2018-10-12 DIAGNOSIS — K766 Portal hypertension: Secondary | ICD-10-CM

## 2018-10-12 DIAGNOSIS — Z87891 Personal history of nicotine dependence: Secondary | ICD-10-CM | POA: Diagnosis not present

## 2018-10-12 LAB — CMP (CANCER CENTER ONLY)
ALBUMIN: 2.1 g/dL — AB (ref 3.5–5.0)
ALT: 52 U/L — ABNORMAL HIGH (ref 0–44)
AST: 113 U/L — ABNORMAL HIGH (ref 15–41)
Alkaline Phosphatase: 231 U/L — ABNORMAL HIGH (ref 38–126)
Anion gap: 7 (ref 5–15)
BUN: 18 mg/dL (ref 8–23)
CO2: 16 mmol/L — ABNORMAL LOW (ref 22–32)
Calcium: 8 mg/dL — ABNORMAL LOW (ref 8.9–10.3)
Chloride: 108 mmol/L (ref 98–111)
Creatinine: 1.39 mg/dL — ABNORMAL HIGH (ref 0.61–1.24)
GFR, Est AFR Am: >60 mL/min (ref >60)
GFR, Estimated: 52 mL/min — ABNORMAL LOW (ref >60)
GLUCOSE: 121 mg/dL — AB (ref 70–99)
Potassium: 4.2 mmol/L (ref 3.5–5.1)
Sodium: 131 mmol/L — ABNORMAL LOW (ref 135–145)
Total Bilirubin: 4.1 mg/dL (ref 0.3–1.2)
Total Protein: 8.3 g/dL — ABNORMAL HIGH (ref 6.5–8.1)

## 2018-10-12 LAB — CBC WITH DIFFERENTIAL (CANCER CENTER ONLY)
Abs Immature Granulocytes: 0.03 10*3/uL (ref 0.00–0.07)
Basophils Absolute: 0.1 10*3/uL (ref 0.0–0.1)
Basophils Relative: 1 %
Eosinophils Absolute: 0.1 10*3/uL (ref 0.0–0.5)
Eosinophils Relative: 2 %
HCT: 32.4 % — ABNORMAL LOW (ref 39.0–52.0)
Hemoglobin: 10.7 g/dL — ABNORMAL LOW (ref 13.0–17.0)
Immature Granulocytes: 0 %
Lymphocytes Relative: 26 %
Lymphs Abs: 1.8 10*3/uL (ref 0.7–4.0)
MCH: 29.8 pg (ref 26.0–34.0)
MCHC: 33 g/dL (ref 30.0–36.0)
MCV: 90.3 fL (ref 80.0–100.0)
Monocytes Absolute: 0.3 10*3/uL (ref 0.1–1.0)
Monocytes Relative: 5 %
Neutro Abs: 4.6 10*3/uL (ref 1.7–7.7)
Neutrophils Relative %: 66 %
Platelet Count: 180 10*3/uL (ref 150–400)
RBC: 3.59 MIL/uL — AB (ref 4.22–5.81)
RDW: 18.1 % — ABNORMAL HIGH (ref 11.5–15.5)
WBC Count: 6.9 10*3/uL (ref 4.0–10.5)
nRBC: 0 % (ref 0.0–0.2)

## 2018-10-12 MED ORDER — SUCRALFATE 1 GM/10ML PO SUSP: 1.0000 g | Freq: Three times a day (TID) | ORAL | 0 refills | Status: AC

## 2018-10-12 NOTE — Telephone Encounter (Signed)
[place] CSW Progress Notes  Request received from Charlean Sanfilippo to reach out to sister re resources for placement or in home support.  Called both patient and sister, left VM w contact information and encouraged return call.  From chart review, patient may qualify for [facility].  Application given to desk nurse for MD review and signature.  Can be submitted upon patient consent to referral.    Edwyna Shell, LCSW Clinical Social Worker Phone:  [phone]

## 2018-10-12 NOTE — Progress Notes (Signed)
[place] OFFICE PROGRESS NOTE   Diagnosis: Hepatocellular carcinoma  INTERVAL HISTORY:   Mr. Werts was admitted on 09/30/2018 with a fever and abdominal pain.  A paracentesis 09/30/2018 revealed a high neutrophil count in the ascitic fluid.  The peritoneal fluid culture returned positive for MRSA.  He was treated with IV vancomycin and is now maintained on Bactrim.  He is taking sorafenib.  No rash, hand/foot pain, mouth sores, or consistent diarrhea.  He is here today with his sister.  He complains of anorexia, generalized weakness, and persistent abdominal distention.  He underwent a paracentesis for 5.8 L of fluid on 10/09/2018.  He continues to have abdominal pain.  The pain is not relieved while taking oxycodone every 6 hours.  His sister reports Mr. Sleight is intermittently confused.  She is scared to leave him alone for an extended time.  Objective:  Vital signs in last 24 hours:  Blood pressure 129/80, pulse 74, temperature (!) 97.5 F (36.4 C), temperature source Oral, resp. rate 17, height 5' 6"  (1.676 m), weight 123 lb 4.8 oz (55.9 kg), SpO2 100 %.    HEENT: Mild white coat over the tongue, possible early thrush at the right buccal mucosa Resp: Lungs clear bilaterally Cardio: Regular rate and rhythm GI: Distended with ascites, no mass, nontender Vascular: Trace pitting edema at the lower leg bilaterally   Portacath/PICC-without erythema  Lab Results:  Lab Results  Component Value Date   WBC 6.9 10/12/2018   HGB 10.7 (L) 10/12/2018   HCT 32.4 (L) 10/12/2018   MCV 90.3 10/12/2018   PLT 180 10/12/2018   NEUTROABS PENDING 10/12/2018    CMP  Lab Results  Component Value Date   NA 131 (L) 10/12/2018   K 4.2 10/12/2018   CL 108 10/12/2018   CO2 16 (L) 10/12/2018   GLUCOSE 121 (H) 10/12/2018   BUN 18 10/12/2018   CREATININE 1.39 (H) 10/12/2018   CALCIUM 8.0 (L) 10/12/2018   PROT 8.3 (H) 10/12/2018   ALBUMIN 2.1 (L) 10/12/2018   AST 113  (H) 10/12/2018   ALT 52 (H) 10/12/2018   ALKPHOS 231 (H) 10/12/2018   BILITOT 4.1 (HH) 10/12/2018   GFRNONAA 52 (L) 10/12/2018   GFRAA >60 10/12/2018    No results found for: CEA1  Lab Results  Component Value Date   INR 2.37 10/01/2018    Imaging:  US Paracentesis  Result Date: 10/09/2018 INDICATION: Patient with history of hepatitis C, hepatocellular carcinoma and cirrhosis with recurrent ascites requiring repeated large volume paracenteses. Request for therapeutic paracentesis today. EXAM: ULTRASOUND GUIDED THERAPEUTIC PARACENTESIS MEDICATIONS: 10 ml 1% lidocaine COMPLICATIONS: None immediate. PROCEDURE: Informed written consent was obtained from the patient after a discussion of the risks, benefits and alternatives to treatment. A timeout was performed prior to the initiation of the procedure. Initial ultrasound scanning demonstrates a large amount of ascites within the left lower abdominal quadrant. The left lower abdomen was prepped and draped in the usual sterile fashion. 1% lidocaine was used for local anesthesia. Following this, a 19 gauge, 7-cm, Yueh catheter was introduced. An ultrasound image was saved for documentation purposes. The paracentesis was performed. The catheter was removed and a dressing was applied. The patient tolerated the procedure well without immediate post procedural complication. FINDINGS: A total of approximately 5.8 L of clear yellow fluid was removed. IMPRESSION: Successful ultrasound-guided paracentesis yielding 5.8 liters of peritoneal fluid. Read by Candiss Norse, PA-C Electronically Signed   By: Jerilynn Mages.  Shick M.D.  On: 10/09/2018 12:10    Medications: I have reviewed the patient's current medications.   Assessment/Plan: 1. Hepatocellular carcinoma ? Ablation of right liver lesion in 2015 ? Markedly elevated AFP ? MRI 08/26/2018- multiple new hepatic lesions compatible with multifocal hepatocellular carcinoma, nonocclusive thrombus versus tumor  thrombus in the proximal portal vein, large proximal gastric varices and borderline splenomegaly, ascites ? Sorafenib started 09/22/2018  2. Hepatitis C and alcohol-related cirrhosis 3. 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 History of tobacco and alcohol abuse-he reports discontinuing alcohol  5. Admission 09/30/2018 with MRSA peritonitis 6. Pain secondary to hepatocellular carcinoma    Disposition: Mr. Caine has hepatocellular carcinoma in the setting of advanced cirrhosis.  He was recently diagnosed with MRSA peritonitis.  His performance status is poor.  He started a trial of sorafenib on 09/22/2018.  He appears to be tolerating the sorafenib well.  I discussed treatment options with Mr. Radu and his sister.  He qualifies for Hospice care, but understands he would need to discontinue sorafenib if he enters a hospice program.  He does not wish to enter hospice or continue sorafenib at present.  He confirmed a no CODE BLUE status.  We will asked the Cancer center social worker to meet with Mr. Mercadel and his sister to discuss placement and availability of home care services.  We will make a referral to [facility] for a PT/OT and safety assessment.  We will also request home RN evaluation for help with medication management.  He will return for an office visit in approximately 3 weeks.  He will contact us in the interim as needed.  25 minutes were spent with the patient today.  The majority of the time was used for counseling and coordination of care.  Betsy Coder, MD  10/12/2018  10:22 AM

## 2018-10-12 NOTE — Telephone Encounter (Signed)
Unable to reach patient per 12/26 los - left message for patient with appt date and time

## 2018-10-12 NOTE — Addendum Note (Signed)
Addended by: Tania Ade on: 10/12/2018 02:29 PM   Modules accepted: Orders

## 2018-10-12 NOTE — Telephone Encounter (Signed)
Left VM for sister to return call re: approval to call [facility] for [facility] Health referral for PT/Nursing/CSW. According to Education officer, museum, he will not qualify for SNF due to being ambulatory and would not qualify for a memory care center since there is not diagnosis of dementia. He does appear to qualify for personal care assistant.  Confirmed with [facility] that they accept his insurance. Requested sister call back as well to let us know if we can use [facility] or do they prefer [facility] as was used in past.

## 2018-10-13 ENCOUNTER — Telehealth: Payer: Self-pay | Admitting: *Deleted

## 2018-10-13 NOTE — Telephone Encounter (Signed)
Spoke with sister, Peter Nelson regarding not qualifying for SNF due to ambulatory status and would not qualify for a locked assisted care unit since he does not have documented diagnosis of dementia. Explained that having PT/nurse and CSW would have more people checking on him during the day while she is at work as well as helping with his strength and access to community resources. Also explained that he would qualify for a personal care assistant who could help with hygiene issues, making him a meal while she was at work and make sure he eats and drinks. She is in agreement with this plan and agrees to proceed.

## 2018-10-14 ENCOUNTER — Emergency Department (HOSPITAL_COMMUNITY)
Admission: EM | Admit: 2018-10-14 | Discharge: 2018-10-15 | Disposition: A | Discharge status: Home / Self Care | Payer: Medicare HMO | Attending: Emergency Medicine | Admitting: Emergency Medicine

## 2018-10-14 ENCOUNTER — Encounter (HOSPITAL_COMMUNITY): Payer: Self-pay | Admitting: Nurse Practitioner

## 2018-10-14 DIAGNOSIS — J45909 Unspecified asthma, uncomplicated: Secondary | ICD-10-CM | POA: Insufficient documentation

## 2018-10-14 DIAGNOSIS — R1084 Generalized abdominal pain: Secondary | ICD-10-CM | POA: Diagnosis not present

## 2018-10-14 DIAGNOSIS — Z79899 Other long term (current) drug therapy: Secondary | ICD-10-CM | POA: Insufficient documentation

## 2018-10-14 DIAGNOSIS — Z8505 Personal history of malignant neoplasm of liver: Secondary | ICD-10-CM | POA: Insufficient documentation

## 2018-10-14 DIAGNOSIS — F17210 Nicotine dependence, cigarettes, uncomplicated: Secondary | ICD-10-CM | POA: Insufficient documentation

## 2018-10-14 DIAGNOSIS — R188 Other ascites: Secondary | ICD-10-CM | POA: Insufficient documentation

## 2018-10-14 DIAGNOSIS — I10 Essential (primary) hypertension: Secondary | ICD-10-CM | POA: Diagnosis not present

## 2018-10-14 DIAGNOSIS — R52 Pain, unspecified: Secondary | ICD-10-CM | POA: Diagnosis not present

## 2018-10-14 DIAGNOSIS — R140 Abdominal distension (gaseous): Secondary | ICD-10-CM | POA: Diagnosis not present

## 2018-10-14 LAB — CBC
HCT: 32 % — ABNORMAL LOW (ref 39.0–52.0)
Hemoglobin: 10.3 g/dL — ABNORMAL LOW (ref 13.0–17.0)
MCH: 30 pg (ref 26.0–34.0)
MCHC: 32.2 g/dL (ref 30.0–36.0)
MCV: 93.3 fL (ref 80.0–100.0)
Platelets: 165 10*3/uL (ref 150–400)
RBC: 3.43 MIL/uL — ABNORMAL LOW (ref 4.22–5.81)
RDW: 18.6 % — ABNORMAL HIGH (ref 11.5–15.5)
WBC: 6.6 10*3/uL (ref 4.0–10.5)
nRBC: 0 % (ref 0.0–0.2)

## 2018-10-14 LAB — URINALYSIS, ROUTINE W REFLEX MICROSCOPIC
Bilirubin Urine: NEGATIVE
Glucose, UA: NEGATIVE mg/dL
Hgb urine dipstick: NEGATIVE
Ketones, ur: NEGATIVE mg/dL
Leukocytes, UA: NEGATIVE
Nitrite: NEGATIVE
Protein, ur: 30 mg/dL — AB
Specific Gravity, Urine: 1.027 (ref 1.005–1.030)
pH: 5 (ref 5.0–8.0)

## 2018-10-14 LAB — COMPREHENSIVE METABOLIC PANEL
ALK PHOS: 199 U/L — AB (ref 38–126)
ALT: 55 U/L — ABNORMAL HIGH (ref 0–44)
AST: 129 U/L — ABNORMAL HIGH (ref 15–41)
Albumin: 2.1 g/dL — ABNORMAL LOW (ref 3.5–5.0)
Anion gap: 5 (ref 5–15)
BUN: 20 mg/dL (ref 8–23)
CO2: 19 mmol/L — ABNORMAL LOW (ref 22–32)
Calcium: 7.7 mg/dL — ABNORMAL LOW (ref 8.9–10.3)
Chloride: 108 mmol/L (ref 98–111)
Creatinine, Ser: 1.66 mg/dL — ABNORMAL HIGH (ref 0.61–1.24)
GFR calc Af Amer: 49 mL/min — ABNORMAL LOW (ref >60)
GFR calc non Af Amer: 42 mL/min — ABNORMAL LOW (ref >60)
Glucose, Bld: 115 mg/dL — ABNORMAL HIGH (ref 70–99)
Potassium: 4 mmol/L (ref 3.5–5.1)
SODIUM: 132 mmol/L — AB (ref 135–145)
Total Bilirubin: 3 mg/dL — ABNORMAL HIGH (ref 0.3–1.2)
Total Protein: 7.7 g/dL (ref 6.5–8.1)

## 2018-10-14 LAB — LIPASE, BLOOD: Lipase: 51 U/L (ref 11–51)

## 2018-10-14 NOTE — ED Triage Notes (Signed)
Pt is c/o abdominal pain and distension that he is relating to hx of liver disease. Recent hospitalization and paracentesis.

## 2018-10-14 NOTE — ED Notes (Signed)
Pt states he cannot void at this time for urine sample.

## 2018-10-15 DIAGNOSIS — R188 Other ascites: Secondary | ICD-10-CM | POA: Diagnosis not present

## 2018-10-15 LAB — BODY FLUID CELL COUNT WITH DIFFERENTIAL
Lymphs, Fluid: 74 %
MONOCYTE-MACROPHAGE-SEROUS FLUID: 13 % — AB (ref 50–90)
Neutrophil Count, Fluid: 13 % (ref 0–25)
Total Nucleated Cell Count, Fluid: 103 cu mm (ref 0–1000)

## 2018-10-15 MED ORDER — MORPHINE SULFATE (PF) 4 MG/ML IV SOLN
4.0000 mg | Freq: Once | INTRAVENOUS | Status: AC
  Administered 2018-10-15: 4 mg via INTRAVENOUS
  Filled 2018-10-15: qty 1

## 2018-10-15 MED ORDER — LIDOCAINE-EPINEPHRINE (PF) 2 %-1:200000 IJ SOLN
10.0000 mL | Freq: Once | INTRAMUSCULAR | Status: DC
  Filled 2018-10-15: qty 10
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
## 2018-10-15 NOTE — Discharge Instructions (Addendum)
Return here as needed. Follow up with your doctor. °

## 2018-10-15 NOTE — ED Provider Notes (Signed)
  Physical Exam  BP 122/78   Pulse 65   Temp 98.6 F (37 C) (Oral)   Resp 20   SpO2 98%    .Paracentesis Date/Time: 10/15/2018 1:46 AM Performed by: Sherwood Gambler, MD Authorized by: Sherwood Gambler, MD   Consent:    Consent obtained:  Written and verbal   Consent given by:  Patient   Risks discussed:  Bleeding, bowel perforation, infection and pain Pre-procedure details:    Procedure purpose:  Therapeutic   Preparation: Patient was prepped and draped in usual sterile fashion   Anesthesia (see MAR for exact dosages):    Anesthesia method:  Local infiltration   Local anesthetic:  Lidocaine 1% w/o epi Procedure details:    Puncture site:  R lower quadrant   Fluid removed amount:  5L   Fluid appearance:  Amber   Dressing:  Adhesive bandage Post-procedure details:    Patient tolerance of procedure:  Tolerated well, no immediate complications    MDM  Therapeutic paracentesis performed given his discomfort and it affecting his breathing to a mild degree.  Labs for SBP are negative.  Discharge home.       Sherwood Gambler, MD 10/15/18 [phone]

## 2018-10-15 NOTE — ED Notes (Signed)
Pt had 5 jars of abdominal fluids drained  Pt v/s continue to be monitored until discharge

## 2018-10-15 NOTE — ED Notes (Signed)
Pt v/s checked now ready to d/c  No signs of hypovolemia

## 2018-10-15 NOTE — ED Notes (Signed)
Called pt family pt d/c to lobby waiting ride

## 2018-10-16 LAB — PATHOLOGIST SMEAR REVIEW

## 2018-10-18 LAB — BODY FLUID CULTURE: Culture: NO GROWTH

## 2018-10-19 ENCOUNTER — Telehealth: Payer: Self-pay | Admitting: *Deleted

## 2018-10-20 ENCOUNTER — Other Ambulatory Visit: Payer: Self-pay

## 2018-10-20 ENCOUNTER — Encounter (HOSPITAL_COMMUNITY): Payer: Self-pay | Admitting: Emergency Medicine

## 2018-10-20 ENCOUNTER — Emergency Department (HOSPITAL_COMMUNITY)
Admission: EM | Admit: 2018-10-20 | Discharge: 2018-10-20 | Disposition: A | Discharge status: Home / Self Care | Payer: Medicare HMO | Attending: Emergency Medicine | Admitting: Emergency Medicine

## 2018-10-20 DIAGNOSIS — T148XXA Other injury of unspecified body region, initial encounter: Secondary | ICD-10-CM

## 2018-10-20 DIAGNOSIS — Z8505 Personal history of malignant neoplasm of liver: Secondary | ICD-10-CM | POA: Insufficient documentation

## 2018-10-20 DIAGNOSIS — F17210 Nicotine dependence, cigarettes, uncomplicated: Secondary | ICD-10-CM | POA: Diagnosis not present

## 2018-10-20 DIAGNOSIS — Y939 Activity, unspecified: Secondary | ICD-10-CM | POA: Insufficient documentation

## 2018-10-20 DIAGNOSIS — S31633A Puncture wound without foreign body of abdominal wall, right lower quadrant with penetration into peritoneal cavity, initial encounter: Secondary | ICD-10-CM | POA: Insufficient documentation

## 2018-10-20 DIAGNOSIS — Y929 Unspecified place or not applicable: Secondary | ICD-10-CM | POA: Insufficient documentation

## 2018-10-20 DIAGNOSIS — J45909 Unspecified asthma, uncomplicated: Secondary | ICD-10-CM | POA: Diagnosis not present

## 2018-10-20 DIAGNOSIS — R0902 Hypoxemia: Secondary | ICD-10-CM | POA: Diagnosis not present

## 2018-10-20 DIAGNOSIS — R42 Dizziness and giddiness: Secondary | ICD-10-CM | POA: Diagnosis not present

## 2018-10-20 DIAGNOSIS — Y999 Unspecified external cause status: Secondary | ICD-10-CM | POA: Insufficient documentation

## 2018-10-20 DIAGNOSIS — Y844 Aspiration of fluid as the cause of abnormal reaction of the patient, or of later complication, without mention of misadventure at the time of the procedure: Secondary | ICD-10-CM | POA: Diagnosis not present

## 2018-10-20 DIAGNOSIS — Z79899 Other long term (current) drug therapy: Secondary | ICD-10-CM | POA: Insufficient documentation

## 2018-10-20 DIAGNOSIS — S3991XA Unspecified injury of abdomen, initial encounter: Secondary | ICD-10-CM | POA: Diagnosis present

## 2018-10-20 DIAGNOSIS — S31139A Puncture wound of abdominal wall without foreign body, unspecified quadrant without penetration into peritoneal cavity, initial encounter: Secondary | ICD-10-CM | POA: Diagnosis not present

## 2018-10-20 LAB — CBC WITH DIFFERENTIAL/PLATELET
Abs Immature Granulocytes: 0.03 10*3/uL (ref 0.00–0.07)
Basophils Absolute: 0 10*3/uL (ref 0.0–0.1)
Basophils Relative: 1 %
Eosinophils Absolute: 0.2 10*3/uL (ref 0.0–0.5)
Eosinophils Relative: 3 %
HCT: 36.1 % — ABNORMAL LOW (ref 39.0–52.0)
Hemoglobin: 11.9 g/dL — ABNORMAL LOW (ref 13.0–17.0)
Immature Granulocytes: 1 %
Lymphocytes Relative: 38 %
Lymphs Abs: 2.3 10*3/uL (ref 0.7–4.0)
MCH: 29.6 pg (ref 26.0–34.0)
MCHC: 33 g/dL (ref 30.0–36.0)
MCV: 89.8 fL (ref 80.0–100.0)
Monocytes Absolute: 0.4 10*3/uL (ref 0.1–1.0)
Monocytes Relative: 7 %
NEUTROS PCT: 50 %
Neutro Abs: 3.2 10*3/uL (ref 1.7–7.7)
Platelets: 124 10*3/uL — ABNORMAL LOW (ref 150–400)
RBC: 4.02 MIL/uL — ABNORMAL LOW (ref 4.22–5.81)
RDW: 19.4 % — ABNORMAL HIGH (ref 11.5–15.5)
WBC: 6.2 10*3/uL (ref 4.0–10.5)
nRBC: 0 % (ref 0.0–0.2)

## 2018-10-20 LAB — COMPREHENSIVE METABOLIC PANEL
ALT: 64 U/L — ABNORMAL HIGH (ref 0–44)
ANION GAP: 7 (ref 5–15)
AST: 133 U/L — ABNORMAL HIGH (ref 15–41)
Albumin: 2.3 g/dL — ABNORMAL LOW (ref 3.5–5.0)
Alkaline Phosphatase: 231 U/L — ABNORMAL HIGH (ref 38–126)
BUN: 24 mg/dL — ABNORMAL HIGH (ref 8–23)
CO2: 23 mmol/L (ref 22–32)
Calcium: 8.7 mg/dL — ABNORMAL LOW (ref 8.9–10.3)
Chloride: 109 mmol/L (ref 98–111)
Creatinine, Ser: 1.51 mg/dL — ABNORMAL HIGH (ref 0.61–1.24)
GFR calc Af Amer: 55 mL/min — ABNORMAL LOW (ref >60)
GFR calc non Af Amer: 47 mL/min — ABNORMAL LOW (ref >60)
GLUCOSE: 101 mg/dL — AB (ref 70–99)
Potassium: 4.6 mmol/L (ref 3.5–5.1)
Sodium: 139 mmol/L (ref 135–145)
Total Bilirubin: 3.9 mg/dL — ABNORMAL HIGH (ref 0.3–1.2)
Total Protein: 8.3 g/dL — ABNORMAL HIGH (ref 6.5–8.1)

## 2018-10-20 LAB — PROTIME-INR
INR: 1.46
Prothrombin Time: 17.6 seconds — ABNORMAL HIGH (ref 11.4–15.2)

## 2018-10-20 LAB — AMMONIA: Ammonia: 25 umol/L (ref 9–35)

## 2018-10-20 MED ORDER — LIDOCAINE HCL (PF) 2 % IJ SOLN
10.0000 mL | Freq: Once | INTRAMUSCULAR | Status: DC
  Filled 2018-10-20: qty 10

## 2018-10-20 NOTE — ED Notes (Signed)
Pt tolerating apple sauce and water without difficulty.

## 2018-10-20 NOTE — Discharge Instructions (Signed)
We put a suture in the wound of your abdominal wall which should stop the leaking.  The leaking was caused by your last paracentesis, which is a common complication.  The suture will need to be removed in 7 to 10 days.  Your primary care doctor can do that for you.  Also please arrange with them to have paracentesis done, as needed.

## 2018-10-20 NOTE — ED Triage Notes (Addendum)
Per EMS, patient from hotel, reports he had paracentesis last week. States steady drainage from abdomen. Abdomen warm to the touch. Patient febrile with EMS. Hx liver cancer and Hep C.  HR 72 BP 104/80  Patient is afebrile upon arrival. Clear drainage noted to be coming from right abdomen. Reports last chemo pill taken x2 days.

## 2018-10-20 NOTE — Telephone Encounter (Signed)
[place] Work  Clinical Social Work received call from patient's sister regarding patient's difficult behavior at home.  Patient's sister requesting patient transition to a SNF.  CSW explained patient is not eligible at this time due to being independent with all ADL's.  Patient's sister reported he is leaving the stove on, smearing feces on the bathroom walls, and exhibiting other sorts of unusual behavior.  She reported the patient left and she does not know where he has been for past several days.   CSW consulted with Dr. Ashok Cordia RN and learned that patient did have capacity to make his own decisions at recent office visit. CSW contacted patient's sister for follow up- patient's sister shared she has "come to terms" with focusing on what is in her control and plans to wait to see where her brother is (as this is not uncharacteristic for him to leave).     Gwinda Maine, LCSW  Clinical Social Worker [facility]
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
## 2018-10-23 MED FILL — NexAVAR 200 MG TABS: 200 | 30 days supply | Qty: 30 | Fill #0

## 2018-10-24 ENCOUNTER — Telehealth: Payer: Self-pay | Admitting: *Deleted

## 2018-10-24 ENCOUNTER — Other Ambulatory Visit: Payer: Self-pay | Admitting: Nurse Practitioner

## 2018-10-24 DIAGNOSIS — C220 Liver cell carcinoma: Secondary | ICD-10-CM

## 2018-10-24 MED ORDER — OXYCODONE HCL 5 MG PO TABS: 5.0000 mg | ORAL_TABLET | Freq: Two times a day (BID) | ORAL | 0 refills | Status: DC | PRN

## 2018-10-24 NOTE — Telephone Encounter (Addendum)
Ex-wife, Levada Dy called to request refill on his oxycodone 5 mg tablet. He is in a lot of pain and won't eat or get OOB. Spoke with patient and he confirms he has been taking his pain med 1 tablet/day and ran out yesterday. His abdomen is swollen again and agreed that a paracentesis would help him feel better and would like to try for tomorrow. Confirmed with him that as of 1/3 he has moved out of his sister's home and is living with Levada Dy again. Patient also reports that [facility] care spoke with him about a personal care assistant and he has agreed to allow them in his home.

## 2018-10-24 NOTE — Telephone Encounter (Signed)
Notified Levada Dy that refill has been sent and he will arrive at [facility] radiology tomorrow at 1015 for his paracentesis.

## 2018-10-24 NOTE — Telephone Encounter (Signed)
"  Calling on behalf of my husband Peter Nelson.  He was given limited prescription for pain medication and needs something for pain.  He's in a lot of pain.  He's lying in bed and won't get up.  He won't eat or drink.  I left a message asking for a return call but need help to get the medicine.  No, I did not leave refill request."

## 2018-10-24 NOTE — Telephone Encounter (Signed)
MD approved refill and paracentesis

## 2018-10-25 ENCOUNTER — Ambulatory Visit (HOSPITAL_COMMUNITY)
Admission: RE | Admit: 2018-10-25 | Discharge: 2018-10-25 | Disposition: A | Discharge status: Home / Self Care | Payer: Medicare HMO | Source: Ambulatory Visit | Attending: Oncology | Admitting: Oncology

## 2018-10-25 DIAGNOSIS — R188 Other ascites: Secondary | ICD-10-CM | POA: Diagnosis not present

## 2018-10-25 DIAGNOSIS — R109 Unspecified abdominal pain: Secondary | ICD-10-CM | POA: Diagnosis not present

## 2018-10-25 DIAGNOSIS — A4151 Sepsis due to Escherichia coli [E. coli]: Secondary | ICD-10-CM | POA: Diagnosis not present

## 2018-10-25 DIAGNOSIS — C220 Liver cell carcinoma: Secondary | ICD-10-CM | POA: Insufficient documentation

## 2018-10-25 MED ORDER — LIDOCAINE HCL 1 % IJ SOLN
INTRAMUSCULAR | Status: AC
  Filled 2018-10-25: qty 20

## 2018-10-25 NOTE — Procedures (Signed)
Ultrasound-guided  therapeutic paracentesis performed yielding 4.3 liters of yellow fluid. No immediate complications. EBL none.

## 2018-10-27 ENCOUNTER — Encounter (HOSPITAL_COMMUNITY): Payer: Self-pay

## 2018-10-27 ENCOUNTER — Emergency Department (HOSPITAL_COMMUNITY): Payer: Medicare HMO

## 2018-10-27 ENCOUNTER — Inpatient Hospital Stay (HOSPITAL_COMMUNITY)
Admission: EM | Admit: 2018-10-27 | Discharge: 2018-11-02 | DRG: 871 | Disposition: A | Discharge status: Hospice | Payer: Medicare HMO | Attending: Internal Medicine | Admitting: Internal Medicine

## 2018-10-27 ENCOUNTER — Other Ambulatory Visit: Payer: Self-pay

## 2018-10-27 DIAGNOSIS — K7031 Alcoholic cirrhosis of liver with ascites: Secondary | ICD-10-CM

## 2018-10-27 DIAGNOSIS — A4151 Sepsis due to Escherichia coli [E. coli]: Principal | ICD-10-CM | POA: Diagnosis present

## 2018-10-27 DIAGNOSIS — R52 Pain, unspecified: Secondary | ICD-10-CM | POA: Diagnosis not present

## 2018-10-27 DIAGNOSIS — R180 Malignant ascites: Secondary | ICD-10-CM | POA: Diagnosis present

## 2018-10-27 DIAGNOSIS — C220 Liver cell carcinoma: Secondary | ICD-10-CM | POA: Diagnosis not present

## 2018-10-27 DIAGNOSIS — N183 Chronic kidney disease, stage 3 (moderate): Secondary | ICD-10-CM | POA: Diagnosis present

## 2018-10-27 DIAGNOSIS — Z79899 Other long term (current) drug therapy: Secondary | ICD-10-CM

## 2018-10-27 DIAGNOSIS — K652 Spontaneous bacterial peritonitis: Secondary | ICD-10-CM | POA: Diagnosis present

## 2018-10-27 DIAGNOSIS — I10 Essential (primary) hypertension: Secondary | ICD-10-CM | POA: Diagnosis not present

## 2018-10-27 DIAGNOSIS — Z7189 Other specified counseling: Secondary | ICD-10-CM | POA: Diagnosis not present

## 2018-10-27 DIAGNOSIS — Z8249 Family history of ischemic heart disease and other diseases of the circulatory system: Secondary | ICD-10-CM | POA: Diagnosis not present

## 2018-10-27 DIAGNOSIS — B182 Chronic viral hepatitis C: Secondary | ICD-10-CM | POA: Diagnosis present

## 2018-10-27 DIAGNOSIS — N179 Acute kidney failure, unspecified: Secondary | ICD-10-CM | POA: Diagnosis not present

## 2018-10-27 DIAGNOSIS — K6389 Other specified diseases of intestine: Secondary | ICD-10-CM | POA: Diagnosis not present

## 2018-10-27 DIAGNOSIS — R188 Other ascites: Secondary | ICD-10-CM

## 2018-10-27 DIAGNOSIS — Z66 Do not resuscitate: Secondary | ICD-10-CM | POA: Diagnosis not present

## 2018-10-27 DIAGNOSIS — E43 Unspecified severe protein-calorie malnutrition: Secondary | ICD-10-CM | POA: Diagnosis not present

## 2018-10-27 DIAGNOSIS — E871 Hypo-osmolality and hyponatremia: Secondary | ICD-10-CM | POA: Diagnosis present

## 2018-10-27 DIAGNOSIS — F1010 Alcohol abuse, uncomplicated: Secondary | ICD-10-CM | POA: Diagnosis not present

## 2018-10-27 DIAGNOSIS — K7030 Alcoholic cirrhosis of liver without ascites: Secondary | ICD-10-CM | POA: Diagnosis present

## 2018-10-27 DIAGNOSIS — R000 Tachycardia, unspecified: Secondary | ICD-10-CM | POA: Diagnosis not present

## 2018-10-27 DIAGNOSIS — R319 Hematuria, unspecified: Secondary | ICD-10-CM | POA: Diagnosis not present

## 2018-10-27 DIAGNOSIS — G9341 Metabolic encephalopathy: Secondary | ICD-10-CM | POA: Diagnosis present

## 2018-10-27 DIAGNOSIS — R627 Adult failure to thrive: Secondary | ICD-10-CM

## 2018-10-27 DIAGNOSIS — I129 Hypertensive chronic kidney disease with stage 1 through stage 4 chronic kidney disease, or unspecified chronic kidney disease: Secondary | ICD-10-CM | POA: Diagnosis present

## 2018-10-27 DIAGNOSIS — R7881 Bacteremia: Secondary | ICD-10-CM | POA: Diagnosis not present

## 2018-10-27 DIAGNOSIS — M2550 Pain in unspecified joint: Secondary | ICD-10-CM | POA: Diagnosis not present

## 2018-10-27 DIAGNOSIS — D638 Anemia in other chronic diseases classified elsewhere: Secondary | ICD-10-CM | POA: Diagnosis present

## 2018-10-27 DIAGNOSIS — D6959 Other secondary thrombocytopenia: Secondary | ICD-10-CM | POA: Diagnosis present

## 2018-10-27 DIAGNOSIS — I8510 Secondary esophageal varices without bleeding: Secondary | ICD-10-CM | POA: Diagnosis not present

## 2018-10-27 DIAGNOSIS — A419 Sepsis, unspecified organism: Secondary | ICD-10-CM | POA: Diagnosis not present

## 2018-10-27 DIAGNOSIS — R791 Abnormal coagulation profile: Secondary | ICD-10-CM | POA: Diagnosis present

## 2018-10-27 DIAGNOSIS — R109 Unspecified abdominal pain: Secondary | ICD-10-CM | POA: Diagnosis not present

## 2018-10-27 DIAGNOSIS — Z886 Allergy status to analgesic agent status: Secondary | ICD-10-CM | POA: Diagnosis not present

## 2018-10-27 DIAGNOSIS — Z7401 Bed confinement status: Secondary | ICD-10-CM | POA: Diagnosis not present

## 2018-10-27 DIAGNOSIS — R1084 Generalized abdominal pain: Secondary | ICD-10-CM

## 2018-10-27 DIAGNOSIS — Z515 Encounter for palliative care: Secondary | ICD-10-CM | POA: Diagnosis not present

## 2018-10-27 DIAGNOSIS — Z681 Body mass index (BMI) 19 or less, adult: Secondary | ICD-10-CM | POA: Diagnosis not present

## 2018-10-27 DIAGNOSIS — Z833 Family history of diabetes mellitus: Secondary | ICD-10-CM

## 2018-10-27 DIAGNOSIS — K767 Hepatorenal syndrome: Secondary | ICD-10-CM | POA: Diagnosis not present

## 2018-10-27 DIAGNOSIS — F17210 Nicotine dependence, cigarettes, uncomplicated: Secondary | ICD-10-CM | POA: Diagnosis present

## 2018-10-27 DIAGNOSIS — Z973 Presence of spectacles and contact lenses: Secondary | ICD-10-CM

## 2018-10-27 DIAGNOSIS — Z4689 Encounter for fitting and adjustment of other specified devices: Secondary | ICD-10-CM | POA: Diagnosis not present

## 2018-10-27 DIAGNOSIS — K769 Liver disease, unspecified: Secondary | ICD-10-CM | POA: Diagnosis not present

## 2018-10-27 LAB — I-STAT CG4 LACTIC ACID, ED
LACTIC ACID, VENOUS: 4.07 mmol/L — AB (ref 0.5–1.9)
Lactic Acid, Venous: 3.39 mmol/L (ref 0.5–1.9)

## 2018-10-27 LAB — COMPREHENSIVE METABOLIC PANEL
ALT: 60 U/L — ABNORMAL HIGH (ref 0–44)
AST: 135 U/L — ABNORMAL HIGH (ref 15–41)
Albumin: 2.1 g/dL — ABNORMAL LOW (ref 3.5–5.0)
Alkaline Phosphatase: 207 U/L — ABNORMAL HIGH (ref 38–126)
Anion gap: 10 (ref 5–15)
BUN: 38 mg/dL — ABNORMAL HIGH (ref 8–23)
CHLORIDE: 102 mmol/L (ref 98–111)
CO2: 18 mmol/L — ABNORMAL LOW (ref 22–32)
Calcium: 8.4 mg/dL — ABNORMAL LOW (ref 8.9–10.3)
Creatinine, Ser: 1.63 mg/dL — ABNORMAL HIGH (ref 0.61–1.24)
GFR calc Af Amer: 50 mL/min — ABNORMAL LOW (ref >60)
GFR calc non Af Amer: 43 mL/min — ABNORMAL LOW (ref >60)
Glucose, Bld: 95 mg/dL (ref 70–99)
Potassium: 4.4 mmol/L (ref 3.5–5.1)
Sodium: 130 mmol/L — ABNORMAL LOW (ref 135–145)
Total Bilirubin: 5.8 mg/dL — ABNORMAL HIGH (ref 0.3–1.2)
Total Protein: 8.7 g/dL — ABNORMAL HIGH (ref 6.5–8.1)

## 2018-10-27 LAB — LACTIC ACID, PLASMA: Lactic Acid, Venous: 3.4 mmol/L (ref 0.5–1.9)

## 2018-10-27 LAB — URINALYSIS, ROUTINE W REFLEX MICROSCOPIC
Bacteria, UA: NONE SEEN
Bilirubin Urine: NEGATIVE
Glucose, UA: NEGATIVE mg/dL
Ketones, ur: NEGATIVE mg/dL
LEUKOCYTES UA: NEGATIVE
Nitrite: NEGATIVE
Protein, ur: NEGATIVE mg/dL
Specific Gravity, Urine: 1.043 — ABNORMAL HIGH (ref 1.005–1.030)
pH: 5 (ref 5.0–8.0)

## 2018-10-27 LAB — CBC WITH DIFFERENTIAL/PLATELET
Abs Immature Granulocytes: 0.1 10*3/uL — ABNORMAL HIGH (ref 0.00–0.07)
Basophils Absolute: 0 10*3/uL (ref 0.0–0.1)
Basophils Relative: 0 %
Eosinophils Absolute: 0 10*3/uL (ref 0.0–0.5)
Eosinophils Relative: 0 %
HCT: 38.1 % — ABNORMAL LOW (ref 39.0–52.0)
Hemoglobin: 12.5 g/dL — ABNORMAL LOW (ref 13.0–17.0)
Immature Granulocytes: 1 %
Lymphocytes Relative: 7 %
Lymphs Abs: 0.8 10*3/uL (ref 0.7–4.0)
MCH: 30.4 pg (ref 26.0–34.0)
MCHC: 32.8 g/dL (ref 30.0–36.0)
MCV: 92.7 fL (ref 80.0–100.0)
MONO ABS: 0.2 10*3/uL (ref 0.1–1.0)
Monocytes Relative: 2 %
Neutro Abs: 10.5 10*3/uL — ABNORMAL HIGH (ref 1.7–7.7)
Neutrophils Relative %: 90 %
PLATELETS: 151 10*3/uL (ref 150–400)
RBC: 4.11 MIL/uL — ABNORMAL LOW (ref 4.22–5.81)
RDW: 20 % — ABNORMAL HIGH (ref 11.5–15.5)
WBC: 11.6 10*3/uL — ABNORMAL HIGH (ref 4.0–10.5)
nRBC: 0 % (ref 0.0–0.2)

## 2018-10-27 LAB — LIPASE, BLOOD: LIPASE: 38 U/L (ref 11–51)

## 2018-10-27 LAB — AMMONIA: Ammonia: 39 umol/L — ABNORMAL HIGH (ref 9–35)

## 2018-10-27 LAB — PROTIME-INR
INR: 1.63
Prothrombin Time: 19.1 seconds — ABNORMAL HIGH (ref 11.4–15.2)

## 2018-10-27 MED ORDER — SODIUM CHLORIDE 0.9 % IV BOLUS (SEPSIS)
250.0000 mL | Freq: Once | INTRAVENOUS | Status: AC
  Administered 2018-10-27: 250 mL via INTRAVENOUS

## 2018-10-27 MED ORDER — SORAFENIB TOSYLATE 200 MG PO TABS: 200.0000 mg | ORAL_TABLET | Freq: Every day | ORAL | Status: DC

## 2018-10-27 MED ORDER — HYDROMORPHONE HCL 1 MG/ML IJ SOLN
0.5000 mg | INTRAMUSCULAR | Status: DC | PRN
  Administered 2018-10-28 – 2018-10-30 (×5): 1 mg via INTRAVENOUS
  Administered 2018-10-30: 0.5 mg via INTRAVENOUS
  Administered 2018-10-31 – 2018-11-02 (×4): 1 mg via INTRAVENOUS
  Filled 2018-10-27 (×11): qty 1

## 2018-10-27 MED ORDER — METRONIDAZOLE IN NACL 5-0.79 MG/ML-% IV SOLN
500.0000 mg | Freq: Three times a day (TID) | INTRAVENOUS | Status: DC
  Administered 2018-10-28 – 2018-11-02 (×17): 500 mg via INTRAVENOUS
  Filled 2018-10-27 (×19): qty 100

## 2018-10-27 MED ORDER — ONDANSETRON HCL 4 MG/2ML IJ SOLN: 4.0000 mg | Freq: Four times a day (QID) | INTRAMUSCULAR | Status: DC | PRN

## 2018-10-27 MED ORDER — SODIUM CHLORIDE 0.9 % IV BOLUS
500.0000 mL | Freq: Once | INTRAVENOUS | Status: AC
  Administered 2018-10-27: 500 mL via INTRAVENOUS

## 2018-10-27 MED ORDER — SODIUM CHLORIDE (PF) 0.9 % IJ SOLN
INTRAMUSCULAR | Status: AC
  Filled 2018-10-27: qty 50

## 2018-10-27 MED ORDER — POLYETHYLENE GLYCOL 3350 17 G PO PACK: 17.0000 g | PACK | Freq: Every day | ORAL | Status: DC | PRN

## 2018-10-27 MED ORDER — METRONIDAZOLE IN NACL 5-0.79 MG/ML-% IV SOLN
500.0000 mg | Freq: Three times a day (TID) | INTRAVENOUS | Status: DC
  Administered 2018-10-27: 500 mg via INTRAVENOUS
  Filled 2018-10-27: qty 100

## 2018-10-27 MED ORDER — HYDROMORPHONE HCL 1 MG/ML IJ SOLN
0.5000 mg | Freq: Once | INTRAMUSCULAR | Status: AC
  Administered 2018-10-27: 0.5 mg via INTRAVENOUS
  Filled 2018-10-27: qty 1

## 2018-10-27 MED ORDER — ACETAMINOPHEN 650 MG RE SUPP: 650.0000 mg | Freq: Four times a day (QID) | RECTAL | Status: DC | PRN

## 2018-10-27 MED ORDER — SODIUM CHLORIDE 0.9 % IV SOLN
2.0000 g | Freq: Once | INTRAVENOUS | Status: AC
  Administered 2018-10-27: 2 g via INTRAVENOUS
  Filled 2018-10-27: qty 2

## 2018-10-27 MED ORDER — IOPAMIDOL (ISOVUE-300) INJECTION 61%
INTRAVENOUS | Status: AC
  Filled 2018-10-27: qty 100

## 2018-10-27 MED ORDER — IOPAMIDOL (ISOVUE-300) INJECTION 61%
100.0000 mL | Freq: Once | INTRAVENOUS | Status: AC | PRN
  Administered 2018-10-27: 100 mL via INTRAVENOUS

## 2018-10-27 MED ORDER — VANCOMYCIN HCL IN DEXTROSE 1-5 GM/200ML-% IV SOLN
1000.0000 mg | INTRAVENOUS | Status: DC
  Administered 2018-10-28: 1000 mg via INTRAVENOUS
  Filled 2018-10-27: qty 200

## 2018-10-27 MED ORDER — PANTOPRAZOLE SODIUM 40 MG PO TBEC
40.0000 mg | DELAYED_RELEASE_TABLET | Freq: Every day | ORAL | Status: DC
  Administered 2018-10-28 – 2018-11-02 (×6): 40 mg via ORAL
  Filled 2018-10-27 (×6): qty 1

## 2018-10-27 MED ORDER — ACETAMINOPHEN 325 MG PO TABS: 650.0000 mg | ORAL_TABLET | Freq: Four times a day (QID) | ORAL | Status: DC | PRN

## 2018-10-27 MED ORDER — SUCRALFATE 1 GM/10ML PO SUSP
1.0000 g | Freq: Three times a day (TID) | ORAL | Status: DC
  Administered 2018-10-28 – 2018-11-02 (×21): 1 g via ORAL
  Filled 2018-10-27 (×21): qty 10

## 2018-10-27 MED ORDER — SODIUM CHLORIDE 0.9 % IV BOLUS (SEPSIS)
500.0000 mL | Freq: Once | INTRAVENOUS | Status: AC
  Administered 2018-10-27: 500 mL via INTRAVENOUS

## 2018-10-27 MED ORDER — SODIUM CHLORIDE 0.9 % IV BOLUS (SEPSIS)
1000.0000 mL | Freq: Once | INTRAVENOUS | Status: AC
  Administered 2018-10-27: 1000 mL via INTRAVENOUS

## 2018-10-27 MED ORDER — NADOLOL 20 MG PO TABS
20.0000 mg | ORAL_TABLET | Freq: Every day | ORAL | Status: DC
  Administered 2018-10-28 – 2018-11-02 (×6): 20 mg via ORAL
  Filled 2018-10-27 (×7): qty 1

## 2018-10-27 MED ORDER — VANCOMYCIN HCL IN DEXTROSE 1-5 GM/200ML-% IV SOLN
1000.0000 mg | Freq: Once | INTRAVENOUS | Status: AC
  Administered 2018-10-27: 1000 mg via INTRAVENOUS
  Filled 2018-10-27: qty 200

## 2018-10-27 MED ORDER — ONDANSETRON HCL 4 MG PO TABS: 4.0000 mg | ORAL_TABLET | Freq: Four times a day (QID) | ORAL | Status: DC | PRN

## 2018-10-27 MED ORDER — SODIUM CHLORIDE 0.9 % IV SOLN
2.0000 g | INTRAVENOUS | Status: DC
  Administered 2018-10-28 – 2018-11-02 (×6): 2 g via INTRAVENOUS
  Filled 2018-10-27 (×2): qty 2
  Filled 2018-10-27: qty 20
  Filled 2018-10-27 (×4): qty 2

## 2018-10-27 NOTE — ED Notes (Signed)
Date and time results received: 10/27/18 10:06 PM   (use smartphrase ".now" to insert current time)  Test: lactic acid  Critical Value: 3.4  Name of Provider Notified: Dr. Alcario Drought   Orders Received? Or Actions Taken?:

## 2018-10-27 NOTE — ED Notes (Signed)
Phlebotomy at bedside.

## 2018-10-27 NOTE — Progress Notes (Signed)
A consult was received from an ED physician for Cefepime and Vancomcyin per pharmacy dosing.  The patient's profile has been reviewed for ht/wt/allergies/indication/available labs.   A one time order has been placed for Cefepime 2g and Vanc 1g.  Further antibiotics/pharmacy consults should be ordered by admitting physician if indicated.                       Thank you,  Gretta Arab PharmD, BCPS Pager [phone] 10/27/2018 6:06 PM

## 2018-10-27 NOTE — ED Notes (Signed)
BLOOD CULTURE X 1 OBTAINED LEFT ARM

## 2018-10-27 NOTE — Progress Notes (Signed)
Pharmacy: Re- sorafenib  Inpatient Sorafenib (Nexavar) hold criteria  Acute coronary syndrome  Gastrointestinal perforation  Hand / foot syndrome - Grade 2 or higher  Hemorrhage  Surgery planned this admission  Active infection (currently on ceftriaxone, vancomycin and flagyl)  - Pharmacy will hold sorafenib per P&T policy.  Dia Sitter, PharmD, BCPS 10/27/2018 11:37 PM

## 2018-10-27 NOTE — ED Notes (Signed)
Provided pt applesauce

## 2018-10-27 NOTE — ED Notes (Signed)
[facility] called for transport. 

## 2018-10-27 NOTE — ED Notes (Signed)
Failed attempt to collect 2nd set of cultures. RN was notified

## 2018-10-27 NOTE — ED Notes (Signed)
ATTEMPTED X1 BY NT JONNA FOR 2ND BLOOD CULTURE. NOT SUCCESS. PT REQUESTED AT THIS TIME NOT TO BE STUCK. CONTINUED WITH PT'S PLAN OF CARE

## 2018-10-27 NOTE — ED Notes (Signed)
Bed: WA20 Expected date:  Expected time:  Means of arrival:  Comments: EMs
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
## 2018-10-27 NOTE — ED Triage Notes (Signed)
Per [facility]- Paracentesis 1 week ago. Pt reports not eating and drinking well. HX of stomach pain increased increased pain today. HX of liver cancer, cirrhosis and HEP C. Denies N/V/D and fever.

## 2018-10-27 NOTE — ED Notes (Signed)
Patient transported to X-ray 

## 2018-10-27 NOTE — ED Notes (Signed)
Consult to General Surgery canceled per EDP,Tegeler,MD.

## 2018-10-27 NOTE — ED Notes (Signed)
Consulted phlebotomy about re-stick for updated lactic acid levels
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
## 2018-10-27 NOTE — ED Notes (Signed)
ED Provider at bedside.TEGELER

## 2018-10-27 NOTE — Progress Notes (Addendum)
Pharmacy Antibiotic Note  Peter Nelson is a 68 y.o. male admitted on 10/27/2018 with suspected SBP with recent MRSA SBP. Pharmacy has been consulted for vancomycin dosing.  Plan:  Vancomycin 1000 mg IV now, then 1000 mg IV q36 hr (est AUC 550 based on SCr 1.63)  Measure vancomycin AUC at steady state as indicated  Rocephin/Flagyl per MD - Will adjust Rocephin to 2g for SBP indication  SCr q48 hr   Height: 5' 6"  (167.6 cm) Weight: 123 lb 3.8 oz (55.9 kg) IBW/kg (Calculated) : 63.8  Temp (24hrs), Avg:99.7 F (37.6 C), Min:98.8 F (37.1 C), Max:100.5 F (38.1 C)  Recent Labs  Lab 10/27/18 1745 10/27/18 1753 10/27/18 2114 10/27/18 2121  WBC 11.6*  --   --   --   CREATININE 1.63*  --   --   --   LATICACIDVEN  --  4.07* 3.4* 3.39*    Estimated Creatinine Clearance: 34.8 mL/min (A) (by C-G formula based on SCr of 1.63 mg/dL (H)).    Allergies  Allergen Reactions  . Aspirin Other (See Comments)    Does not take because of Hep C     Thank you for allowing pharmacy to be a part of this patient's care.  Reuel Boom, PharmD, BCPS [phone] 10/27/2018, 10:39 PM

## 2018-10-28 ENCOUNTER — Inpatient Hospital Stay (HOSPITAL_COMMUNITY): Payer: Medicare HMO

## 2018-10-28 DIAGNOSIS — E43 Unspecified severe protein-calorie malnutrition: Secondary | ICD-10-CM

## 2018-10-28 DIAGNOSIS — A419 Sepsis, unspecified organism: Secondary | ICD-10-CM

## 2018-10-28 LAB — COMPREHENSIVE METABOLIC PANEL
ALK PHOS: 140 U/L — AB (ref 38–126)
ALT: 44 U/L (ref 0–44)
AST: 91 U/L — ABNORMAL HIGH (ref 15–41)
Albumin: 1.5 g/dL — ABNORMAL LOW (ref 3.5–5.0)
Anion gap: 7 (ref 5–15)
BILIRUBIN TOTAL: 4.1 mg/dL — AB (ref 0.3–1.2)
BUN: 33 mg/dL — ABNORMAL HIGH (ref 8–23)
CO2: 16 mmol/L — ABNORMAL LOW (ref 22–32)
Calcium: 7.4 mg/dL — ABNORMAL LOW (ref 8.9–10.3)
Chloride: 108 mmol/L (ref 98–111)
Creatinine, Ser: 1.28 mg/dL — ABNORMAL HIGH (ref 0.61–1.24)
GFR calc Af Amer: >60 mL/min (ref >60)
GFR, EST NON AFRICAN AMERICAN: 58 mL/min — AB (ref >60)
Glucose, Bld: 115 mg/dL — ABNORMAL HIGH (ref 70–99)
Potassium: 4 mmol/L (ref 3.5–5.1)
Sodium: 131 mmol/L — ABNORMAL LOW (ref 135–145)
TOTAL PROTEIN: 6.3 g/dL — AB (ref 6.5–8.1)

## 2018-10-28 LAB — BLOOD CULTURE ID PANEL (REFLEXED)
Acinetobacter baumannii: NOT DETECTED
CANDIDA KRUSEI: NOT DETECTED
CANDIDA PARAPSILOSIS: NOT DETECTED
Candida albicans: NOT DETECTED
Candida glabrata: NOT DETECTED
Candida tropicalis: NOT DETECTED
Carbapenem resistance: NOT DETECTED
Enterobacter cloacae complex: NOT DETECTED
Enterobacteriaceae species: DETECTED — AB
Enterococcus species: NOT DETECTED
Escherichia coli: DETECTED — AB
Haemophilus influenzae: NOT DETECTED
Klebsiella oxytoca: NOT DETECTED
Klebsiella pneumoniae: NOT DETECTED
Listeria monocytogenes: NOT DETECTED
Neisseria meningitidis: NOT DETECTED
PSEUDOMONAS AERUGINOSA: NOT DETECTED
Proteus species: NOT DETECTED
STREPTOCOCCUS SPECIES: NOT DETECTED
Serratia marcescens: NOT DETECTED
Staphylococcus aureus (BCID): NOT DETECTED
Staphylococcus species: NOT DETECTED
Streptococcus agalactiae: NOT DETECTED
Streptococcus pneumoniae: NOT DETECTED
Streptococcus pyogenes: NOT DETECTED

## 2018-10-28 LAB — GRAM STAIN

## 2018-10-28 LAB — CBC
HEMATOCRIT: 28.8 % — AB (ref 39.0–52.0)
Hemoglobin: 9.1 g/dL — ABNORMAL LOW (ref 13.0–17.0)
MCH: 30.2 pg (ref 26.0–34.0)
MCHC: 31.6 g/dL (ref 30.0–36.0)
MCV: 95.7 fL (ref 80.0–100.0)
Platelets: 95 10*3/uL — ABNORMAL LOW (ref 150–400)
RBC: 3.01 MIL/uL — ABNORMAL LOW (ref 4.22–5.81)
RDW: 20.3 % — ABNORMAL HIGH (ref 11.5–15.5)
WBC: 15.3 10*3/uL — ABNORMAL HIGH (ref 4.0–10.5)
nRBC: 0 % (ref 0.0–0.2)

## 2018-10-28 LAB — BODY FLUID CELL COUNT WITH DIFFERENTIAL
Eos, Fluid: 0 %
Lymphs, Fluid: 1 %
Monocyte-Macrophage-Serous Fluid: 9 % — ABNORMAL LOW (ref 50–90)
Neutrophil Count, Fluid: 90 % — ABNORMAL HIGH (ref 0–25)
Total Nucleated Cell Count, Fluid: 8829 cu mm — ABNORMAL HIGH (ref 0–1000)

## 2018-10-28 LAB — LACTIC ACID, PLASMA: Lactic Acid, Venous: 3.8 mmol/L (ref 0.5–1.9)

## 2018-10-28 MED ORDER — LIDOCAINE HCL 1 % IJ SOLN
INTRAMUSCULAR | Status: AC
  Filled 2018-10-28: qty 10

## 2018-10-28 MED ORDER — SODIUM CHLORIDE 0.9 % IV BOLUS
500.0000 mL | Freq: Once | INTRAVENOUS | Status: AC
  Administered 2018-10-28: 500 mL via INTRAVENOUS

## 2018-10-28 NOTE — Progress Notes (Signed)
PROGRESS NOTE  Peter Nelson NOI:[id number] DOB: Jun 12, 1951 DOA: 10/27/2018 PCP: Lanae Boast, FNP  HPI/Recap of past 24 hours:  Fever coming down, reports ab pain is improving, awaiting for paracentesis  Assessment/Plan: Principal Problem:   SBP (spontaneous bacterial peritonitis) ([place]) Active Problems:   Chronic hepatitis C without hepatic coma (HCC)   Alcoholic cirrhosis of liver (HCC)   Hepatocellular carcinoma (HCC)   Esophageal varices in alcoholic cirrhosis (HCC)   HTN (hypertension)   Protein-calorie malnutrition, severe  Sepsis/SBP/bacteremia -presented with fever, leukocytosis, lactic acidosis -Continue currently abx , awaiting for ascites fluids culture  AKI on CKDII -cr 1.63 on admission, today is 1.28 -renal dosing meds  Hyponatremia in a cirrhotic patient with ascites -sodium 130-131  Anemia: Likely combination of anemia of chronic disease and intermittent gi loss monitor hgb and sign of bleeding   Hepatocellular carcinoma  Chronic hepatitis C without hepatic coma/alcohol cirrhosis/esophageal varices/recurrent ascites Per family patient has received paracentesis almost 10times all together, he got three paracentesis last week, Not on Lasix/spironolatone due to renal function and bp issues Currently on nadolol Has been on nexavar  will discuss palliative drain placement once infection resolves  Severe malnutrition due to chronic disease/underweight  Family reports ongoing weight loss  FTT: has been in bed most of the day and night, per ex wife patient has been in bed 22hr out of 24hrs most of the time.  perex wife, patient recently changed his living will , he wants his daughter and ex wife to be his HPOA instead of his sister Family will bring in paper work Family confirmed DNR status Will discuss home hospice with family once they bring in paper work.  Code Status: DNR  Family Communication: patient and ex wife at bedside  Disposition Plan:  not ready to discharge   Consultants:  IR  Procedures:  US guided paracentesis on 1/11  Antibiotics:  As above   Objective: BP 108/76 (BP Location: Left Arm)   Pulse (!) 113   Temp 98.9 F (37.2 C) (Oral)   Resp 16   Ht 5' 6"  (1.676 m)   Wt 55.9 kg   SpO2 98%   BMI 19.89 kg/m   Intake/Output Summary (Last 24 hours) at 10/28/2018 1218 Last data filed at 10/28/2018 1003 Gross per 24 hour  Intake 3650.09 ml  Output 125 ml  Net 3525.09 ml   Filed Weights   10/27/18 1707  Weight: 55.9 kg    Exam: Patient is examined daily including today on 10/28/2018, exams remain the same as of yesterday except that has changed    General:  Thin, malnourished, frail, chronically ill,   Cardiovascular: RRR  Respiratory: CTABL  Abdomen: mild distension, mild lower abdomen tender, but still soft,positive BS  Musculoskeletal: No Edema  Neuro: alert, not oriented to time , know he is in the hospital but not able to state the name  Data Reviewed: Basic Metabolic Panel: Recent Labs  Lab 10/27/18 1745 10/28/18 0417  NA 130* 131*  K 4.4 4.0  CL 102 108  CO2 18* 16*  GLUCOSE 95 115*  BUN 38* 33*  CREATININE 1.63* 1.28*  CALCIUM 8.4* 7.4*   Liver Function Tests: Recent Labs  Lab 10/27/18 1745 10/28/18 0417  AST 135* 91*  ALT 60* 44  ALKPHOS 207* 140*  BILITOT 5.8* 4.1*  PROT 8.7* 6.3*  ALBUMIN 2.1* 1.5*   Recent Labs  Lab 10/27/18 1745  LIPASE 38   Recent Labs  Lab 10/27/18 1745  AMMONIA  39*   CBC: Recent Labs  Lab 10/27/18 1745 10/28/18 0417  WBC 11.6* 15.3*  NEUTROABS 10.5*  --   HGB 12.5* 9.1*  HCT 38.1* 28.8*  MCV 92.7 95.7  PLT 151 95*   Cardiac Enzymes:   No results for input(s): CKTOTAL, CKMB, CKMBINDEX, TROPONINI in the last 168 hours. BNP (last 3 results) Recent Labs    09/12/18 1029  BNP 311.9*    ProBNP (last 3 results) No results for input(s): PROBNP in the last 8760 hours.  CBG: No results for input(s): GLUCAP in the  last 168 hours.  Recent Results (from the past 240 hour(s))  Blood Culture (routine x 2)     Status: None (Preliminary result)   Collection Time: 10/27/18  5:58 PM  Result Value Ref Range Status   Specimen Description   Final    BLOOD BLOOD LEFT ARM Performed at [facility] [address], [place], [place] [address]    Special Requests   Final    BOTTLES DRAWN AEROBIC AND ANAEROBIC Blood Culture adequate volume Performed at [facility] [address], [place], [place] [address]    Culture  Setup Time   Final    GRAM NEGATIVE RODS IN BOTH AEROBIC AND ANAEROBIC BOTTLES CRITICAL RESULT CALLED TO, READ BACK BY AND VERIFIED WITH: PHARMD TERRY GREEN 0913 Performed at [facility], [place] [address] [place],  [address]    Culture GRAM NEGATIVE RODS  Final   Report Status PENDING  Incomplete  Blood Culture ID Panel (Reflexed)     Status: Abnormal   Collection Time: 10/27/18  5:58 PM  Result Value Ref Range Status   Enterococcus species NOT DETECTED NOT DETECTED Final   Listeria monocytogenes NOT DETECTED NOT DETECTED Final   Staphylococcus species NOT DETECTED NOT DETECTED Final   Staphylococcus aureus (BCID) NOT DETECTED NOT DETECTED Final   Streptococcus species NOT DETECTED NOT DETECTED Final   Streptococcus agalactiae NOT DETECTED NOT DETECTED Final   Streptococcus pneumoniae NOT DETECTED NOT DETECTED Final   Streptococcus pyogenes NOT DETECTED NOT DETECTED Final   Acinetobacter baumannii NOT DETECTED NOT DETECTED Final   Enterobacteriaceae species DETECTED (A) NOT DETECTED Final    Comment: Enterobacteriaceae represent a large family of gram-negative bacteria, not a single organism. CRITICAL RESULT CALLED TO, READ BACK BY AND VERIFIED WITH: PHARMD TERRY GREEN [id number] F[facility]    Enterobacter cloacae complex NOT DETECTED NOT DETECTED Final   Escherichia coli DETECTED (A) NOT DETECTED Final    Comment: CRITICAL RESULT CALLED TO,  READ BACK BY AND VERIFIED WITH: PHARMD TERRY GREEN [id number] F[facility]    Klebsiella oxytoca NOT DETECTED NOT DETECTED Final   Klebsiella pneumoniae NOT DETECTED NOT DETECTED Final   Proteus species NOT DETECTED NOT DETECTED Final   Serratia marcescens NOT DETECTED NOT DETECTED Final   Carbapenem resistance NOT DETECTED NOT DETECTED Final   Haemophilus influenzae NOT DETECTED NOT DETECTED Final   Neisseria meningitidis NOT DETECTED NOT DETECTED Final   Pseudomonas aeruginosa NOT DETECTED NOT DETECTED Final   Candida albicans NOT DETECTED NOT DETECTED Final   Candida glabrata NOT DETECTED NOT DETECTED Final   Candida krusei NOT DETECTED NOT DETECTED Final   Candida parapsilosis NOT DETECTED NOT DETECTED Final   Candida tropicalis NOT DETECTED NOT DETECTED Final     Studies: Dg Chest 2 View  Result Date: 10/27/2018 CLINICAL DATA:  Tachycardia with chills. EXAM: CHEST - 2 VIEW COMPARISON:  09/30/2018 FINDINGS: Normal heart  size and mediastinal contours. Lungs are clear. No overt pulmonary edema, effusion or pneumothorax. No acute osseous abnormality. IMPRESSION: No active cardiopulmonary disease. Electronically Signed   By: Ashley Royalty M.D.   On: 10/27/2018 18:09   Ct Abdomen Pelvis W Contrast  Result Date: 10/27/2018 CLINICAL DATA:  Abdominal pain and distention. History of liver disease, post multiple paracenteses. Paracentesis yesterday with worsening abdominal pain today. Suspicion for injury related paracentesis. History of hepatocellular carcinoma, post ablation. EXAM: CT ABDOMEN AND PELVIS WITH CONTRAST TECHNIQUE: Multidetector CT imaging of the abdomen and pelvis was performed using the standard protocol following bolus administration of intravenous contrast. CONTRAST:  137m ISOVUE-300 IOPAMIDOL (ISOVUE-300) INJECTION 61% COMPARISON:  Ultrasound paracentesis 10/25/2018. MRI abdomen 02/28/2017, MRI abdomen 08/26/2018 FINDINGS: Lower chest: Emphysematous changes and fibrosis in the lung  bases. Small esophageal hiatal hernia. Hepatobiliary: Changes of hepatic cirrhosis with nodular liver contour and prominence of the lateral segment left and caudate lobes of the liver. Overall, the liver is small in size. Multiple lesions are demonstrated throughout the liver, some hypoechoic, some heterogeneous and predominantly isoechoic, and some with surrounding enhancing capsules. The largest lesions are in segment 4, measuring 3.8 cm diameter and in segment 5, measuring 4.3 cm in diameter. These lesions likely represent malignant lesions, with the lesion appearance most consistent with multifocal hepatocellular carcinoma. Appearance of the lesions is similar to previous MRI from 08/26/2018. There is a circumscribed cystic appearing lesion centrally at the dome of the liver which corresponds to a previous ablation center as seen on previous MRI. No evidence of parenchymal hemorrhage. Gallbladder is contracted. No stones are identified. No bile duct dilatation. Pancreas: Unremarkable. No pancreatic ductal dilatation or surrounding inflammatory changes. Spleen: Normal in size without focal abnormality. Adrenals/Urinary Tract: Adrenal glands are unremarkable. Kidneys are normal, without renal calculi, focal lesion, or hydronephrosis. Bladder is unremarkable. Stomach/Bowel: Stomach, small bowel, and colon are not abnormally distended. Mild diffuse small bowel wall thickening and hyperemia likely related to ascites. Early ischemic change would be a less likely possibility. Colon is decompressed and stool-filled, limiting evaluation of the colonic wall but there appears to be some colonic wall thickening. This may be related to pulmonary hypertensive colopathy. Appendix is not definitively identified. Vascular/Lymphatic: Aortic atherosclerosis. No enlarged abdominal or pelvic lymph nodes. Multiple upper abdominal varices identified with varices in the omentum and mesentery. 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 In addition, there is new focal asymmetrical filling defect in the proximal superior mesenteric vein likely represents focal nonocclusive mesenteric vein thrombosis. Reproductive: Prostate is unremarkable. Other: There is prominent diffuse abdominal and pelvic ascites. No free air is identified. No definite omental nodularity. Musculoskeletal: Somewhat poorly defined lucent bone lesions are demonstrated in the L1 vertebra and in the left ischium, suspicious for metastatic disease. 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 IMPRESSION: 1. Changes of hepatic cirrhosis with portal venous hypertension, multiple upper abdominal varices, mesenteric varices, and diffuse abdominal and pelvic ascites. 2. Multiple lesions throughout the liver likely representing malignant lesions, probably multifocal hepatocellular carcinoma. 3. Portal venous thrombus again demonstrated, similar to previous MRI. There is progression with new nonocclusive thrombus in the superior mesenteric vein today. 4. Diffuse small bowel wall thickening and hyperemia may be related to ascites. Early ischemic change could also have this appearance. 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 5. Indeterminate bone lesions in the L1 vertebra and left ischium suspicious for metastatic disease. 6. Emphysematous changes and fibrosis in the lung bases.  Aortic Atherosclerosis (ICD10-I70.0) and Emphysema (ICD10-J43.9). These results were called by telephone at the time of interpretation on 10/27/2018 at 7:36 pm to Dr. Marda Stalker , who verbally acknowledged these results. Electronically Signed   By: Lucienne Capers M.D.   On: 10/27/2018 19:56    Scheduled Meds: . lidocaine      . nadolol  20 mg Oral Daily  . pantoprazole  40 mg Oral Daily  . sucralfate  1 g Oral TID WC & HS    Continuous Infusions: . cefTRIAXone (ROCEPHIN)  IV Stopped (10/28/18 0724)  . metronidazole Stopped (10/28/18  0529)  . vancomycin       Time spent: 17mns I have personally reviewed and interpreted on  10/28/2018 daily labs, imagings as discussed above under date review session and assessment and plans.  I reviewed all nursing notes, pharmacy notes, consultant notes,  vitals, pertinent old records  I have discussed plan of care as described above with RN , patient and family on 10/28/2018   FFlorencia ReasonsMD, PhD  [facility] Pager [phone] If 7PM-7AM, please contact night-coverage at [email or web address], password [email or web address]1/08/2019, 12:18 PM  LOS: 1 day

## 2018-10-28 NOTE — Procedures (Signed)
PROCEDURE SUMMARY:  Successful US guided paracentesis from left lateral abdomen.  Yielded 2 liters of clear yellow fluid.  No immediate complications.  Patient tolerated well.  EBL = trace  Specimen was sent for labs.  Neelie Welshans S Arad Burston PA-C 10/28/2018 2:45 PM

## 2018-10-28 NOTE — Progress Notes (Signed)
PHARMACY - PHYSICIAN COMMUNICATION CRITICAL VALUE ALERT - BLOOD CULTURE IDENTIFICATION (BCID)  Peter Nelson is an 68 y.o. male who presented to [facility] on 10/27/2018 with a chief complaint of abdominal pain, hx cirrhosis, risk of SBP  Assessment:  1 of 4 bottles with E coli, no resistance noted  Name of physician (or Provider) Contacted: Xu  Current antibiotics: Ceftriaxone 2gm q24, Metronidazole 543m IV q8  Changes to prescribed antibiotics recommended: no change    Results for orders placed or performed during the hospital encounter of 10/27/18  Blood Culture ID Panel (Reflexed) (Collected: 10/27/2018  5:58 PM)  Result Value Ref Range   Enterococcus species NOT DETECTED NOT DETECTED   Listeria monocytogenes NOT DETECTED NOT DETECTED   Staphylococcus species NOT DETECTED NOT DETECTED   Staphylococcus aureus (BCID) NOT DETECTED NOT DETECTED   Streptococcus species NOT DETECTED NOT DETECTED   Streptococcus agalactiae NOT DETECTED NOT DETECTED   Streptococcus pneumoniae NOT DETECTED NOT DETECTED   Streptococcus pyogenes NOT DETECTED NOT DETECTED   Acinetobacter baumannii NOT DETECTED NOT DETECTED   Enterobacteriaceae species DETECTED (A) NOT DETECTED   Enterobacter cloacae complex NOT DETECTED NOT DETECTED   Escherichia coli DETECTED (A) NOT DETECTED   Klebsiella oxytoca NOT DETECTED NOT DETECTED   Klebsiella pneumoniae NOT DETECTED NOT DETECTED   Proteus species NOT DETECTED NOT DETECTED   Serratia marcescens NOT DETECTED NOT DETECTED   Carbapenem resistance NOT DETECTED NOT DETECTED   Haemophilus influenzae NOT DETECTED NOT DETECTED   Neisseria meningitidis NOT DETECTED NOT DETECTED   Pseudomonas aeruginosa NOT DETECTED NOT DETECTED   Candida albicans NOT DETECTED NOT DETECTED   Candida glabrata NOT DETECTED NOT DETECTED   Candida krusei NOT DETECTED NOT DETECTED   Candida parapsilosis NOT DETECTED NOT DETECTED   Candida tropicalis NOT DETECTED NOT DETECTED    GNyoka Cowden  Augustino Savastano L 10/28/2018  9:18 AM

## 2018-10-29 DIAGNOSIS — R7881 Bacteremia: Secondary | ICD-10-CM

## 2018-10-29 DIAGNOSIS — N179 Acute kidney failure, unspecified: Secondary | ICD-10-CM

## 2018-10-29 DIAGNOSIS — R627 Adult failure to thrive: Secondary | ICD-10-CM

## 2018-10-29 LAB — BASIC METABOLIC PANEL
Anion gap: 5 (ref 5–15)
BUN: 29 mg/dL — ABNORMAL HIGH (ref 8–23)
CHLORIDE: 108 mmol/L (ref 98–111)
CO2: 17 mmol/L — ABNORMAL LOW (ref 22–32)
CREATININE: 1.06 mg/dL (ref 0.61–1.24)
Calcium: 7.3 mg/dL — ABNORMAL LOW (ref 8.9–10.3)
GFR calc Af Amer: >60 mL/min (ref >60)
GFR calc non Af Amer: >60 mL/min (ref >60)
Glucose, Bld: 100 mg/dL — ABNORMAL HIGH (ref 70–99)
Potassium: 4.2 mmol/L (ref 3.5–5.1)
Sodium: 130 mmol/L — ABNORMAL LOW (ref 135–145)

## 2018-10-29 LAB — CBC WITH DIFFERENTIAL/PLATELET
Abs Immature Granulocytes: 0.15 10*3/uL — ABNORMAL HIGH (ref 0.00–0.07)
Basophils Absolute: 0 10*3/uL (ref 0.0–0.1)
Basophils Relative: 0 %
Eosinophils Absolute: 0.1 10*3/uL (ref 0.0–0.5)
Eosinophils Relative: 0 %
HEMATOCRIT: 24.7 % — AB (ref 39.0–52.0)
Hemoglobin: 8.2 g/dL — ABNORMAL LOW (ref 13.0–17.0)
Immature Granulocytes: 1 %
LYMPHS ABS: 1.9 10*3/uL (ref 0.7–4.0)
Lymphocytes Relative: 16 %
MCH: 30.4 pg (ref 26.0–34.0)
MCHC: 33.2 g/dL (ref 30.0–36.0)
MCV: 91.5 fL (ref 80.0–100.0)
Monocytes Absolute: 1 10*3/uL (ref 0.1–1.0)
Monocytes Relative: 9 %
Neutro Abs: 8.5 10*3/uL — ABNORMAL HIGH (ref 1.7–7.7)
Neutrophils Relative %: 74 %
Platelets: 87 10*3/uL — ABNORMAL LOW (ref 150–400)
RBC: 2.7 MIL/uL — ABNORMAL LOW (ref 4.22–5.81)
RDW: 20.4 % — ABNORMAL HIGH (ref 11.5–15.5)
WBC: 11.5 10*3/uL — ABNORMAL HIGH (ref 4.0–10.5)
nRBC: 0 % (ref 0.0–0.2)

## 2018-10-29 LAB — PROTIME-INR
INR: 2.82
Prothrombin Time: 29.3 seconds — ABNORMAL HIGH (ref 11.4–15.2)

## 2018-10-29 LAB — URINE CULTURE: Culture: <10000 — AB

## 2018-10-29 LAB — MAGNESIUM: Magnesium: 1.6 mg/dL — ABNORMAL LOW (ref 1.7–2.4)

## 2018-10-29 MED ORDER — MAGNESIUM SULFATE 2 GM/50ML IV SOLN
2.0000 g | Freq: Once | INTRAVENOUS | Status: AC
  Administered 2018-10-29: 2 g via INTRAVENOUS
  Filled 2018-10-29: qty 50

## 2018-10-29 MED ORDER — SULFAMETHOXAZOLE-TRIMETHOPRIM 800-160 MG PO TABS
1.0000 | ORAL_TABLET | Freq: Every day | ORAL | Status: DC
  Administered 2018-10-29 – 2018-11-01 (×4): 1 via ORAL
  Filled 2018-10-29 (×5): qty 1

## 2018-10-29 MED ORDER — ENSURE ENLIVE PO LIQD
237.0000 mL | Freq: Two times a day (BID) | ORAL | Status: DC
  Administered 2018-10-29 – 2018-10-30 (×2): 237 mL via ORAL

## 2018-10-29 NOTE — Plan of Care (Signed)
Plan of care discussed with caregiver Levada Dy at bedside.

## 2018-10-29 NOTE — Progress Notes (Signed)
PROGRESS NOTE  Peter Nelson Peter Nelson ZMO:[id number] DOB: [birth year] DOA: 10/27/2018 PCP: Lanae Boast, FNP  HPI/Recap of past 24 hours:   t max 99.9, he reports feeling better, denies pain, report black stool He is less confused today, his exwife at bedside Ex wife reports patient has been changing his HPOA back and forth, patient in the past wants his sister to be HPOA, today, patient reports he wants his daughter and his ex wife who is currently at bedside to be his HPOA    Assessment/Plan: Principal Problem:   SBP (spontaneous bacterial peritonitis) ([place]) Active Problems:   Chronic hepatitis C without hepatic coma ([place])   Alcoholic cirrhosis of liver ([place])   Hepatocellular carcinoma ([place])   Esophageal varices in alcoholic cirrhosis ([place])   HTN (hypertension)   Protein-calorie malnutrition, severe  Sepsis/SBP/ecoli bacteremia -presented with fever, leukocytosis, lactic acidosis -Continue currently abx , awaiting for final culture result.  AKI on CKDII -cr 1.63 on admission, today is 1.06 -renal dosing meds  Hyponatremia in a cirrhotic patient with ascites -sodium 130-131  Anemia: Likely combination of anemia of chronic disease and intermittent gi loss hgb dropping, Has black stool today, continue carafate  Continue goals of care, blood transfusion will depends on goals of care.  Thrombocytopenia From infection with underline liver disease  Coagulopathy INR 2.8 Monitor INR, consider vitamin k depends of goals of care  Hepatocellular carcinoma  Chronic hepatitis C without hepatic coma/alcohol cirrhosis/esophageal varices/recurrent ascites Per family patient has received paracentesis almost 10times all together, he got three paracentesis last week, Not on Lasix/spironolatone due to renal function and bp issues Currently on nadolol Has been on nexavar  will discuss palliative drain placement once infection getting controlled  Severe malnutrition due to chronic  disease/underweight  Family reports ongoing weight loss  FTT:  has been in bed most of the day and night, per ex wife patient has been in bed 22hr out of 24hrs most of the time.  Per ex wife, patient recently changed his living will , he wants his daughter and ex wife to be his HPOA , he does not want  his sister to be HPOA Chaplain consulted for updated living will Family confirmed DNR status Palliative care consulted for goals of care, patient will benefit from comfort measures and hospice, he does not want to go to a facility, he wants to go home with his ex wife, ex wife works third shift as Chartered certified accountant  at [employer] long I have started discussion with them about full comfort measures and hospice , patient and family is interested to meets with palliative care to continue the discussion, palliative care consutled  Prognosis guarded   Code Status: DNR  Family Communication: patient and ex wife at bedside  Disposition Plan: not ready to discharge, possible transition to comfort care and home hospice pending palliative care discussion    Consultants:  IR  Procedures:  US guided paracentesis on 1/11  Antibiotics:  As above   Objective: BP 110/72   Pulse 93   Temp 99.9 F (37.7 C) (Oral)   Resp 18   Ht 5' 6"  (1.676 m)   Wt 55.9 kg   SpO2 100%   BMI 19.89 kg/m   Intake/Output Summary (Last 24 hours) at 10/29/2018 1307 Last data filed at 10/29/2018 1059 Gross per 24 hour  Intake 787.59 ml  Output 280 ml  Net 507.59 ml   Filed Weights   10/27/18 1707  Weight: 55.9 kg    Exam:  Patient is examined daily including today on 10/29/2018, exams remain the same as of yesterday except that has changed    General:  Thin, malnourished, frail, chronically ill, aaox3  Cardiovascular: RRR  Respiratory: CTABL  Abdomen: mild distension, mild lower abdomen tender, but still soft,positive BS  Musculoskeletal: No Edema  Neuro: alert, aaox3  Data Reviewed: Basic Metabolic  Panel: Recent Labs  Lab 10/27/18 1745 10/28/18 0417 10/29/18 0329  NA 130* 131* 130*  K 4.4 4.0 4.2  CL 102 108 108  CO2 18* 16* 17*  GLUCOSE 95 115* 100*  BUN 38* 33* 29*  CREATININE 1.63* 1.28* 1.06  CALCIUM 8.4* 7.4* 7.3*  MG  --   --  1.6*   Liver Function Tests: Recent Labs  Lab 10/27/18 1745 10/28/18 0417  AST 135* 91*  ALT 60* 44  ALKPHOS 207* 140*  BILITOT 5.8* 4.1*  PROT 8.7* 6.3*  ALBUMIN 2.1* 1.5*   Recent Labs  Lab 10/27/18 1745  LIPASE 38   Recent Labs  Lab 10/27/18 1745  AMMONIA 39*   CBC: Recent Labs  Lab 10/27/18 1745 10/28/18 0417 10/29/18 0329  WBC 11.6* 15.3* 11.5*  NEUTROABS 10.5*  --  8.5*  HGB 12.5* 9.1* 8.2*  HCT 38.1* 28.8* 24.7*  MCV 92.7 95.7 91.5  PLT 151 95* 87*   Cardiac Enzymes:   No results for input(s): CKTOTAL, CKMB, CKMBINDEX, TROPONINI in the last 168 hours. BNP (last 3 results) Recent Labs    09/12/18 1029  BNP 311.9*    ProBNP (last 3 results) No results for input(s): PROBNP in the last 8760 hours.  CBG: No results for input(s): GLUCAP in the last 168 hours.  Recent Results (from the past 240 hour(s))  Urine culture     Status: Abnormal   Collection Time: 10/27/18  5:45 PM  Result Value Ref Range Status   Specimen Description URINE, RANDOM  Final   Special Requests   Final    NONE Performed at [facility] [address], [place], [place] [address]    Culture <10,000 COLONIES/mL INSIGNIFICANT GROWTH (A)  Final   Report Status 10/29/2018 FINAL  Final  Blood Culture (routine x 2)     Status: Abnormal (Preliminary result)   Collection Time: 10/27/18  5:58 PM  Result Value Ref Range Status   Specimen Description   Final    BLOOD BLOOD LEFT ARM Performed at [facility] [address] [place], [place] [address]    Special Requests   Final    BOTTLES DRAWN AEROBIC AND ANAEROBIC Blood Culture adequate volume Performed at [facility]  [address], [place], [place] [address]    Culture  Setup Time   Final    GRAM NEGATIVE RODS IN BOTH AEROBIC AND ANAEROBIC BOTTLES CRITICAL RESULT CALLED TO, READ BACK BY AND VERIFIED WITH: PHARMD TERRY GREEN 0913 Performed at [facility], [place] [address] [place], [place] [address]    Culture ESCHERICHIA COLI (A)  Final   Report Status PENDING  Incomplete  Blood Culture ID Panel (Reflexed)     Status: Abnormal   Collection Time: 10/27/18  5:58 PM  Result Value Ref Range Status   Enterococcus species NOT DETECTED NOT DETECTED Final   Listeria monocytogenes NOT DETECTED NOT DETECTED Final   Staphylococcus species NOT DETECTED NOT DETECTED Final   Staphylococcus aureus (BCID) NOT DETECTED NOT DETECTED Final   Streptococcus species NOT DETECTED NOT DETECTED Final   Streptococcus agalactiae  NOT DETECTED NOT DETECTED Final   Streptococcus pneumoniae NOT DETECTED NOT DETECTED Final   Streptococcus pyogenes NOT DETECTED NOT DETECTED Final   Acinetobacter baumannii NOT DETECTED NOT DETECTED Final   Enterobacteriaceae species DETECTED (A) NOT DETECTED Final    Comment: Enterobacteriaceae represent a large family of gram-negative bacteria, not a single organism. CRITICAL RESULT CALLED TO, READ BACK BY AND VERIFIED WITH: PHARMD TERRY GREEN [id number] FCP    Enterobacter cloacae complex NOT DETECTED NOT DETECTED Final   Escherichia coli DETECTED (A) NOT DETECTED Final    Comment: CRITICAL RESULT CALLED TO, READ BACK BY AND VERIFIED WITH: PHARMD TERRY GREEN [id number] [id number] F[facility]    Klebsiella oxytoca NOT DETECTED NOT DETECTED Final   Klebsiella pneumoniae NOT DETECTED NOT DETECTED Final   Proteus species NOT DETECTED NOT DETECTED Final   Serratia marcescens NOT DETECTED NOT DETECTED Final   Carbapenem resistance NOT DETECTED NOT DETECTED Final   Haemophilus influenzae NOT DETECTED NOT DETECTED Final   Neisseria meningitidis NOT DETECTED NOT DETECTED Final   Pseudomonas aeruginosa NOT DETECTED  NOT DETECTED Final   Candida albicans NOT DETECTED NOT DETECTED Final   Candida glabrata NOT DETECTED NOT DETECTED Final   Candida krusei NOT DETECTED NOT DETECTED Final   Candida parapsilosis NOT DETECTED NOT DETECTED Final   Candida tropicalis NOT DETECTED NOT DETECTED Final  Blood Culture (routine x 2)     Status: None (Preliminary result)   Collection Time: 10/27/18  9:14 PM  Result Value Ref Range Status   Specimen Description   Final    BLOOD RIGHT ANTECUBITAL Performed at [facility], [place] [address], [place], [place] [address]    Special Requests   Final    BOTTLES DRAWN AEROBIC ONLY Blood Culture adequate volume Performed at [facility] [address], [place], [place] [address]    Culture   Final    NO GROWTH 1 DAY Performed at [facility], [place] [address] [place], [place] [address]    Report Status PENDING  Incomplete  Culture, body fluid-bottle     Status: None (Preliminary result)   Collection Time: 10/28/18  6:55 PM  Result Value Ref Range Status   Specimen Description PERITONEAL  Final   Special Requests NONE  Final   Culture   Final    NO GROWTH < 24 HOURS Performed at [facility], [place] [address] [place], [place] [address]    Report Status PENDING  Incomplete  Gram stain     Status: None   Collection Time: 10/28/18  6:55 PM  Result Value Ref Range Status   Specimen Description PERITONEAL  Final   Special Requests NONE  Final   Gram Stain   Final    FEW WBC PRESENT, PREDOMINANTLY PMN NO ORGANISMS SEEN Performed at [facility], [place] [address], [place], [place] [address]    Report Status 10/28/2018 FINAL  Final     Studies: US Paracentesis  Result Date: 10/28/2018 INDICATION: Hepatocellular carcinoma. Cirrhosis. Recurrent ascites. Request for therapeutic paracentesis. EXAM: ULTRASOUND GUIDED PARACENTESIS MEDICATIONS: 1% lidocaine 7 mL COMPLICATIONS: None immediate. PROCEDURE: Informed written  consent was obtained from the patient after a discussion of the risks, benefits and alternatives to treatment. A timeout was performed prior to the initiation of the procedure. Initial ultrasound scanning demonstrates a small amount of ascites within the left lower abdominal quadrant. The left lower abdomen was prepped and draped in the usual sterile fashion. 1% lidocaine  with epinephrine was used for local anesthesia. Following this, a 19 gauge, 7-cm, Yueh catheter was introduced. An ultrasound image was saved for documentation purposes. The paracentesis was performed. The catheter was removed and a dressing was applied. The patient tolerated the procedure well without immediate post procedural complication. FINDINGS: A total of approximately 2 L of clear yellow fluid was removed. Samples were sent to the laboratory as requested by the clinical team. IMPRESSION: Successful ultrasound-guided paracentesis yielding 2 liters of peritoneal fluid. Read by: Gareth Eagle, PA-C Electronically Signed   By: Jacqulynn Cadet M.D.   On: 10/28/2018 14:26    Scheduled Meds: . feeding supplement (ENSURE ENLIVE)  237 mL Oral BID BM  . nadolol  20 mg Oral Daily  . pantoprazole  40 mg Oral Daily  . sucralfate  1 g Oral TID WC & HS  . sulfamethoxazole-trimethoprim  1 tablet Oral Daily    Continuous Infusions: . cefTRIAXone (ROCEPHIN)  IV 200 mL/hr at 10/29/18 0600  . metronidazole 500 mg (10/29/18 1153)     Time spent: 60mns I have personally reviewed and interpreted on  10/29/2018 daily labs, imagings as discussed above under date review session and assessment and plans.  I reviewed all nursing notes, pharmacy notes, consultant notes,  vitals, pertinent old records  I have discussed plan of care as described above with RN , patient and family on 10/29/2018   FFlorencia ReasonsMD, PhD  [facility] Pager [phone] If 7PM-7AM, please contact night-coverage at [email or web address], password [email or web address]1/09/2019, 1:07 PM  LOS:  2 days

## 2018-10-29 NOTE — Plan of Care (Signed)
  Problem: Education: Goal: Knowledge of General Education information will improve Description Including pain rating scale, medication(s)/side effects and non-pharmacologic comfort measures Outcome: Progressing   

## 2018-10-30 DIAGNOSIS — K652 Spontaneous bacterial peritonitis: Secondary | ICD-10-CM

## 2018-10-30 DIAGNOSIS — Z515 Encounter for palliative care: Secondary | ICD-10-CM

## 2018-10-30 DIAGNOSIS — Z7189 Other specified counseling: Secondary | ICD-10-CM

## 2018-10-30 LAB — CULTURE, BLOOD (ROUTINE X 2): Special Requests: ADEQUATE

## 2018-10-30 LAB — CBC WITH DIFFERENTIAL/PLATELET
Abs Immature Granulocytes: 0.14 10*3/uL — ABNORMAL HIGH (ref 0.00–0.07)
BASOS ABS: 0 10*3/uL (ref 0.0–0.1)
Basophils Relative: 0 %
Eosinophils Absolute: 0.1 10*3/uL (ref 0.0–0.5)
Eosinophils Relative: 1 %
HCT: 29 % — ABNORMAL LOW (ref 39.0–52.0)
Hemoglobin: 9.4 g/dL — ABNORMAL LOW (ref 13.0–17.0)
Immature Granulocytes: 1 %
LYMPHS ABS: 2.1 10*3/uL (ref 0.7–4.0)
Lymphocytes Relative: 21 %
MCH: 30.8 pg (ref 26.0–34.0)
MCHC: 32.4 g/dL (ref 30.0–36.0)
MCV: 95.1 fL (ref 80.0–100.0)
Monocytes Absolute: 0.9 10*3/uL (ref 0.1–1.0)
Monocytes Relative: 9 %
NEUTROS PCT: 68 %
Neutro Abs: 6.8 10*3/uL (ref 1.7–7.7)
Platelets: 109 10*3/uL — ABNORMAL LOW (ref 150–400)
RBC: 3.05 MIL/uL — ABNORMAL LOW (ref 4.22–5.81)
RDW: 21.2 % — ABNORMAL HIGH (ref 11.5–15.5)
WBC: 10 10*3/uL (ref 4.0–10.5)
nRBC: 0 % (ref 0.0–0.2)

## 2018-10-30 LAB — MAGNESIUM: Magnesium: 2.1 mg/dL (ref 1.7–2.4)

## 2018-10-30 LAB — BASIC METABOLIC PANEL
ANION GAP: 7 (ref 5–15)
BUN: 30 mg/dL — ABNORMAL HIGH (ref 8–23)
CO2: 17 mmol/L — ABNORMAL LOW (ref 22–32)
Calcium: 7.4 mg/dL — ABNORMAL LOW (ref 8.9–10.3)
Chloride: 108 mmol/L (ref 98–111)
Creatinine, Ser: 1.17 mg/dL (ref 0.61–1.24)
GFR calc non Af Amer: >60 mL/min (ref >60)
Glucose, Bld: 99 mg/dL (ref 70–99)
Potassium: 4 mmol/L (ref 3.5–5.1)
Sodium: 132 mmol/L — ABNORMAL LOW (ref 135–145)

## 2018-10-30 LAB — PROTIME-INR
INR: 2.28
Prothrombin Time: 24.9 seconds — ABNORMAL HIGH (ref 11.4–15.2)

## 2018-10-30 MED ORDER — PHYTONADIONE 5 MG PO TABS
2.5000 mg | ORAL_TABLET | Freq: Once | ORAL | Status: AC
  Administered 2018-10-30: 2.5 mg via ORAL
  Filled 2018-10-30: qty 1

## 2018-10-30 MED ORDER — OXYCODONE HCL 5 MG PO TABS
5.0000 mg | ORAL_TABLET | Freq: Two times a day (BID) | ORAL | Status: DC | PRN
  Administered 2018-10-30 – 2018-11-01 (×2): 5 mg via ORAL
  Filled 2018-10-30 (×2): qty 1

## 2018-10-30 NOTE — Progress Notes (Signed)
   10/30/18 1457  Clinical Encounter Type  Visited With Patient and family together  Visit Type Initial;Other (Comment) (AD)  Referral From Nurse  Consult/Referral To Chaplain  The chaplain responded to consult for Pt. AD.  The Pt. and his Ex.wife-Angela were present during the education and completion of the document.  The notarized document will supercede any pre-existing AD. The chaplain gave the original document and a copy of the AD to the Pt.  A copy of the AD was placed in the Pt. chart.  The Pt. declined any further spiritual care at this time.
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
## 2018-10-30 NOTE — Consult Note (Signed)
   [facility] CM Inpatient Consult   10/30/2018  Login [facility] June 13, 1951 [id number]     Patient screened for potential [facility] Care Management services due to unplanned readmission risk score of 46% (extreme) and multiple hospitalizations.  Chart reviewed. Mr. Mahaney is eligible or enrolled in [place] program. Verified on All Payers List. [employer] Special Needs program provides case management services. Therefore, [facility] Care Management not appropriate at this time.    Marthenia Rolling, MSN-Ed, RN,BSN [facility] Liaison [phone]

## 2018-10-30 NOTE — Plan of Care (Signed)
Pt alert and oriented, drowsy this am. C/o abd pain, RN will monitor.

## 2018-10-30 NOTE — Consult Note (Signed)
Consultation Note Date: 10/30/2018   Patient Name: Peter Nelson  DOB: Sep 01, 1951  MRN: [id number]  Age / Sex: 68 y.o., male  PCP: Lanae Boast, [place] Referring Physician: Florencia Reasons, MD  Reason for Consultation: Establishing goals of care  HPI/Patient Profile: 68 y.o. male    admitted on 10/27/2018    Clinical Assessment and Goals of Care:  68 yo gentleman known to PMT, seen in December 2019. He has a life limiting illness of hepatocellular carcinoma, chronic hepatitis C. He also has history of ETOH cirrhosis, esophageal varices. He has seen Dr Benay Spice from oncology in the past. He has recurrent ascites.   The patient was under the care of his sister, how ever his ex wife Peter Nelson is now present at the bedside.   The patient has been admitted with sepsis E coli bacteremia. 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 He has coagulopathy high INR due to his liver disease.   A PMT consult has been requested for ongoing goals of care discussions, advanced directives.   The patient is resting in bed. He has lost more weight, he appears frail and weak. Ex wife Peter Nelson is at the bedside. I introduced myself and palliative care as follows: Palliative medicine is specialized medical care for people living with serious illness. It focuses on providing relief from the symptoms and stress of a serious illness. The goal is to improve quality of life for both the patient and the family.  Patient says yes to having new advanced directives paperwork, designating his ex wife Peter Nelson to be his HCPOA agent. He is a DNR.   Peter Nelson states that the patient has been getting weaker by the day, since the past 3-4 weeks. He remains with worsening appetite and worsening functional status.   He is to be seen by IR for abdominal drain placement.   The patient and his ex wife Peter Dy do not have any kids together, how  ever, the patient reportedly has 7-9 kids.  We discussed about what is important to the patient. He doesn't want to go to a hospice facility. He is accepting of hospice support at [facility].   NEXT OF KIN  ex wife Peter Nelson.   SUMMARY OF RECOMMENDATIONS    Agree with DNR DNI IR following for drain placement.  Continue current mode of care.  Care manager consulted to help provide ex wife Peter Nelson with private duty sitter list.  Home with hospice on discharge, towards the end of this hospitalization Prognosis likely not more than few weeks at this point.   Code Status/Advance Care Planning:  DNR    Symptom Management:    as above   Palliative Prophylaxis:   Delirium Protocol   Psycho-social/Spiritual:   Desire for further Chaplaincy support:yes  Additional Recommendations: Education on Hospice  Prognosis:   < 4 weeks  Discharge Planning: To Be Determined      Primary Diagnoses: Present on Admission: . Hepatocellular carcinoma ([place]) . Alcoholic cirrhosis of liver ([place]) .  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 Chronic hepatitis C without hepatic coma ([place]) . HTN (hypertension) . Protein-calorie malnutrition, severe . SBP (spontaneous bacterial peritonitis) ([place])   I have reviewed the medical record, interviewed the patient and family, and examined the patient. The following aspects are pertinent.  Past Medical History:  Diagnosis Date  . Alcohol abuse   . Allergy   . Arthritis   . Asthma   . Cancer ([place])    liver  . Cirrhosis ([place])   . Dysrhythmia   . Esophageal varices ([place])   . Hepatitis C   . Hepatocellular carcinoma ([place])   . Hyperplastic colon polyp   . Hypertension   . Internal hemorrhoids   . Mallory-Weiss tear   . Shortness of breath dyspnea    with activity and anxiety  . Ulcerative colitis ([place])   . Wears glasses    Social History   Socioeconomic History  . Marital status: Widowed    Spouse name: Not on file  . Number  of children: 48  . Years of education: Not on file  . Highest education level: Not on file  Occupational History  . Not on file  Social Needs  . Financial resource strain: Not on file  . Food insecurity:    Worry: Not on file    Inability: Not on file  . Transportation needs:    Medical: Not on file    Non-medical: Not on file  Tobacco Use  . Smoking status: Current Every Day Smoker    Packs/day: 0.25    Years: 55.00    Pack years: 13.75    Types: Cigarettes  . Smokeless tobacco: Never Used  . Tobacco comment: cutting back  Substance and Sexual Activity  . Alcohol use: Not Currently    Alcohol/week: 0.0 standard drinks    Frequency: Never    Comment: "a 1/2 pint a day" for more than 30 years, average 1 pint a day, cut back lately   . Drug use: No  . Sexual activity: Yes    Partners: Female  Lifestyle  . Physical activity:    Days per week: Not on file    Minutes per session: Not on file  . Stress: Not on file  Relationships  . Social connections:    Talks on phone: Not on file    Gets together: Not on file    Attends religious service: Not on file    Active member of club or organization: Not on file    Attends meetings of clubs or organizations: Not on file    Relationship status: Not on file  Other Topics Concern  . Not on file  Social History Narrative   Married Dec 22nd, 2016 to [place]   Has #13 children   Family History  Problem Relation Age of Onset  . Hypertension Mother   . Diabetes Mother   . Heart disease Mother   . Alzheimer's disease Mother   . Diabetes Sister   . Diabetes Father   . Cancer Brother        Liver  . Colon cancer Neg Hx   . Esophageal cancer Neg Hx   . Stomach cancer Neg Hx   . Rectal cancer Neg Hx    Scheduled Meds: . feeding supplement (ENSURE ENLIVE)  237 mL Oral BID BM  . nadolol  20 mg Oral Daily  . pantoprazole  40 mg Oral Daily  . sucralfate  1 g Oral TID WC & HS  .  sulfamethoxazole-trimethoprim  1 tablet Oral Daily     Continuous Infusions: . cefTRIAXone (ROCEPHIN)  IV 200 mL/hr at 10/30/18 0600  . metronidazole 500 mg (10/30/18 1112)   PRN Meds:.acetaminophen **OR** acetaminophen, HYDROmorphone (DILAUDID) injection, ondansetron **OR** ondansetron (ZOFRAN) IV, polyethylene glycol Medications Prior to Admission:  Prior to Admission medications   Medication Sig Start Date End Date Taking? Authorizing Provider  Ferrous Sulfate 27 MG TABS Take 27 mg by mouth daily.   Yes [provider]  nadolol (CORGARD) 20 MG tablet Take 1 tablet (20 mg total) by mouth daily. 09/05/18  Yes Pyrtle, Lajuan Lines, MD  NEXAVAR 200 MG tablet TAKE 1 TABLET (200 MG TOTAL) BY MOUTH AT BEDTIME. GIVE ON AN EMPTY STOMACH 1 HOUR BEFORE OR 2 HOURS AFTER MEALS. Patient taking differently: Take 200 mg by mouth at bedtime. Give on an empty stomach 1 hour before or 2 hours after meals 10/13/18  Yes Ladell Pier, MD  oxyCODONE (OXY IR/ROXICODONE) 5 MG immediate release tablet Take 1 tablet (5 mg total) by mouth 2 (two) times daily as needed for severe pain. 10/24/18  Yes Owens Shark, NP  pantoprazole (PROTONIX) 40 MG tablet Take 40 mg by mouth daily as needed (indigestion).   Yes [provider]  polyethylene glycol (MIRALAX / GLYCOLAX) packet Take 17 g by mouth daily. Patient taking differently: Take 17 g by mouth daily as needed for moderate constipation.  08/27/18  Yes Regalado, Belkys A, MD  sucralfate (CARAFATE) 1 GM/10ML suspension Take 10 mLs (1 g total) by mouth 4 (four) times daily -  with meals and at bedtime. 10/12/18  Yes Ladell Pier, MD  sulfamethoxazole-trimethoprim (BACTRIM DS,SEPTRA DS) 800-160 MG tablet Take 1 tablet by mouth daily. Patient taking differently: Take 1 tablet by mouth 2 (two) times a week.  10/04/18 11/03/18 Yes Elodia Florence., MD  ondansetron (ZOFRAN) 8 MG tablet Take 1 tablet (8 mg total) by mouth 2 (two) times daily as needed for nausea or vomiting. 09/21/18   Ladell Pier, MD   pantoprazole (PROTONIX) 40 MG tablet Take 1 tablet (40 mg total) by mouth 2 (two) times daily for 14 days, THEN 1 tablet (40 mg total) daily. Patient taking differently: Take 1 tablet (40 mg total) by mouth daily 09/07/18 10/21/18  Raiford Noble Latif, DO  spironolactone (ALDACTONE) 50 MG tablet Take 1 tablet (50 mg total) by mouth daily. 08/26/18 10/14/18  Regalado, Cassie Freer, MD   Allergies  Allergen Reactions  . Aspirin Other (See Comments)    Does not take because of Hep C    Review of Systems Some pain in abdomen  Physical Exam    General:  Thin, malnourished, frail, chronically ill, aaox3  Cardiovascular: RRR  Respiratory: CTABL  Abdomen: mild distension, mild lower abdomen tender, soft,positive BS  Musculoskeletal: No Edema  Neuro: alert, aaox3  Flat affect  Vital Signs: BP 116/82 (BP Location: Right Arm)   Pulse 89   Temp 98.4 F (36.9 C)   Resp 16   Ht 5' 6"  (1.676 m)   Wt 55.9 kg   SpO2 100%   BMI 19.89 kg/m  Pain Scale: 0-10   Pain Score: 3    SpO2: SpO2: 100 % O2 Device:SpO2: 100 % O2 Flow Rate: .   IO: Intake/output summary:   Intake/Output Summary (Last 24 hours) at 10/30/2018 1252 Last data filed at 10/30/2018 0939 Gross per 24 hour  Intake 888.15 ml  Output 450 ml  Net 438.15 ml  LBM: Last BM Date: 10/29/18 Baseline Weight: Weight: 55.9 kg Most recent weight: Weight: 55.9 kg     Palliative Assessment/Data:   Flowsheet Rows     Most Recent Value  Intake Tab  Referral Department  Hospitalist  Unit at Time of Referral  Med/Surg Unit  Palliative Care Primary Diagnosis  Other (Comment) [MRSA SBP]  Palliative Care Type  Return patient Palliative Care  Reason for referral  Clarify Goals of Care, Counsel Regarding Hospice  Date of Admission  10/27/18  Clinical Assessment  Psychosocial & Spiritual Assessment  Palliative Care Outcomes     PPS 30%  Time In:  1400 Time Out:  1500 Time Total:  60 min  Greater than 50%  of this time  was spent counseling and coordinating care related to the above assessment and plan.  Signed by: Loistine Chance, MD [id number]  Please contact Palliative Medicine Team phone at [phone] for questions and concerns.  For individual provider: See Shea Evans

## 2018-10-30 NOTE — Plan of Care (Signed)
  Problem: Education: Goal: Knowledge of General Education information will improve Description Including pain rating scale, medication(s)/side effects and non-pharmacologic comfort measures Outcome: Progressing   

## 2018-10-30 NOTE — Care Management Important Message (Signed)
Important Message  Patient Details  Name: Peter Nelson MRN: [id number] Date of Birth: 1951-06-19   Medicare Important Message Given:  Yes    Kerin Salen 10/30/2018, 12:43 [place] Message  Patient Details  Name: Peter Nelson MRN: [id number] Date of Birth: Mar 03, 1951   Medicare Important Message Given:  Yes    Kerin Salen 10/30/2018, 12:43 PM

## 2018-10-30 NOTE — Progress Notes (Signed)
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 -leukocytosis and confusion  resolved today  AKI on CKDII -cr 1.63 on admission, improving -renal dosing meds  Hyponatremia in a cirrhotic patient with ascites -sodium 130-131-132  Anemia: Likely combination of anemia of chronic disease and intermittent gi loss Has black stool on 1/12, hgb nadir at 8.2, today at 9.4  continue carafate  Likely home with home hospice in 1-2 days.  Thrombocytopenia From infection with underline liver disease plt nadir at 87, improving  Coagulopathy INR 2.8, oral vitk x1 on 1/13  In preparation of peritoneal drain placement Monitor INR,   Hepatocellular carcinoma  Chronic hepatitis C without hepatic coma/alcohol cirrhosis/esophageal varices/recurrent ascites Per family patient has received paracentesis almost 10times all together, he got three paracentesis last week, Not on Lasix/spironolatone due to renal function and bp issues Currently on nadolol Has been on nexavar IR consulted for palliative drain placement, input appreciated  Severe malnutrition due to  chronic disease/underweight  Family reports ongoing weight loss  FTT:  has been in bed most of the day and night, per ex wife patient has been in bed 22hr out of 24hrs most of the time.  Per ex wife, patient recently changed his living will , he wants his daughter and ex wife to be his HPOA , he does not want  his sister to be HPOA Chaplain consulted for updated living will Family confirmed DNR status Palliative care consulted for goals of care, patient will benefit from comfort measures and hospice, he does not want to go to a facility, he wants to go home with his ex wife, ex wife works third shift as Chartered certified accountant  at [employer] long Palliative care consulted, input appreciated   likely home with home hospice in 1-2 days Prognosis guarded   Code Status: DNR  Family Communication: patient and ex wife at bedside on 1/11 and 1/12  Disposition Plan: home with  home hospice in 1-2 days   Consultants:  IR  Palliative care  Procedures:  US guided paracentesis on 1/11  Antibiotics:  As above   Objective: BP 99/75 (BP Location: Right Arm)   Pulse 83   Temp 98.2 F (36.8 C) (Oral)   Resp 13   Ht 5' 6"  (1.676 m)   Wt 55.9 kg   SpO2 98%   BMI 19.89 kg/m   Intake/Output Summary (Last 24 hours) at 10/30/2018 1525 Last data filed at 10/30/2018 0939 Gross per 24 hour  Intake 528.15 ml  Output 350 ml  Net 178.15 ml   Filed Weights   10/27/18 1707  Weight: 55.9 kg    Exam: Patient is examined daily including today on 10/30/2018, exams remain the same as of yesterday  except that has changed    General:  Thin, malnourished, frail, chronically ill, aaox3  Cardiovascular: RRR  Respiratory: CTABL  Abdomen: mild distension, mild lower abdomen tender, but still soft,positive BS  Musculoskeletal: No Edema  Neuro: alert, aaox3  Data Reviewed: Basic Metabolic Panel: Recent Labs  Lab 10/27/18 1745 10/28/18 0417 10/29/18 0329 10/30/18 0403  NA 130* 131* 130* 132*  K  4.4 4.0 4.2 4.0  CL 102 108 108 108  CO2 18* 16* 17* 17*  GLUCOSE 95 115* 100* 99  BUN 38* 33* 29* 30*  CREATININE 1.63* 1.28* 1.06 1.17  CALCIUM 8.4* 7.4* 7.3* 7.4*  MG  --   --  1.6* 2.1   Liver Function Tests: Recent Labs  Lab 10/27/18 1745 10/28/18 0417  AST 135* 91*  ALT 60* 44  ALKPHOS 207* 140*  BILITOT 5.8* 4.1*  PROT 8.7* 6.3*  ALBUMIN 2.1* 1.5*   Recent Labs  Lab 10/27/18 1745  LIPASE 38   Recent Labs  Lab 10/27/18 1745  AMMONIA 39*   CBC: Recent Labs  Lab 10/27/18 1745 10/28/18 0417 10/29/18 0329 10/30/18 0403  WBC 11.6* 15.3* 11.5* 10.0  NEUTROABS 10.5*  --  8.5* 6.8  HGB 12.5* 9.1* 8.2* 9.4*  HCT 38.1* 28.8* 24.7* 29.0*  MCV 92.7 95.7 91.5 95.1  PLT 151 95* 87* 109*   Cardiac Enzymes:   No results for input(s): CKTOTAL, CKMB, CKMBINDEX, TROPONINI in the last 168 hours. BNP (last 3 results) Recent Labs    09/12/18 1029  BNP 311.9*    ProBNP (last 3 results) No results for input(s): PROBNP in the last 8760 hours.  CBG: No results for input(s): GLUCAP in the last 168 hours.  Recent Results (from the past 240 hour(s))  Urine culture     Status: Abnormal   Collection Time: 10/27/18  5:45 PM  Result Value Ref Range Status   Specimen Description URINE, RANDOM  Final   Special Requests   Final    NONE Performed at [facility] [address] [place], [place] [address]    Culture <10,000 COLONIES/mL INSIGNIFICANT GROWTH (A)  Final   Report Status 10/29/2018 FINAL  Final  Blood Culture (routine x 2)     Status: Abnormal   Collection Time: 10/27/18  5:58 PM  Result Value Ref Range Status   Specimen Description   Final    BLOOD BLOOD LEFT ARM Performed at [facility] [address], [place], [place] [address]    Special Requests   Final    BOTTLES DRAWN AEROBIC AND ANAEROBIC Blood Culture adequate volume Performed at [facility] [address] [place], [place] [address]      Culture  Setup Time   Final    GRAM NEGATIVE RODS IN BOTH AEROBIC AND ANAEROBIC BOTTLES CRITICAL RESULT CALLED TO, READ BACK BY AND VERIFIED WITH: PHARMD TERRY GREEN 0913 Performed at [facility], [place] [address] [place], [place] [address]    Culture ESCHERICHIA COLI (A)  Final   Report Status 10/30/2018 FINAL  Final   Organism ID, Bacteria ESCHERICHIA COLI  Final      Susceptibility   Escherichia coli - MIC*    AMPICILLIN >=32 RESISTANT Resistant     CEFAZOLIN <=4 SENSITIVE Sensitive     CEFEPIME <=1 SENSITIVE Sensitive     CEFTAZIDIME <=1 SENSITIVE Sensitive     CEFTRIAXONE <=1 SENSITIVE Sensitive     CIPROFLOXACIN <=0.25 SENSITIVE Sensitive  GENTAMICIN <=1 SENSITIVE Sensitive     IMIPENEM <=0.25 SENSITIVE Sensitive     TRIMETH/SULFA >=320 RESISTANT Resistant     AMPICILLIN/SULBACTAM 16 INTERMEDIATE Intermediate     PIP/TAZO <=4 SENSITIVE Sensitive     Extended ESBL NEGATIVE Sensitive     * ESCHERICHIA COLI  Blood Culture ID Panel (Reflexed)     Status: Abnormal   Collection Time: 10/27/18  5:58 PM  Result Value Ref Range Status   Enterococcus species NOT DETECTED NOT DETECTED Final   Listeria monocytogenes NOT DETECTED NOT DETECTED Final   Staphylococcus species NOT DETECTED NOT DETECTED Final   Staphylococcus aureus (BCID) NOT DETECTED NOT DETECTED Final   Streptococcus species NOT DETECTED NOT DETECTED Final   Streptococcus agalactiae NOT DETECTED NOT DETECTED Final   Streptococcus pneumoniae NOT DETECTED NOT DETECTED Final   Streptococcus pyogenes NOT DETECTED NOT DETECTED Final   Acinetobacter baumannii NOT DETECTED NOT DETECTED Final   Enterobacteriaceae species DETECTED (A) NOT DETECTED Final    Comment: Enterobacteriaceae represent a large family of gram-negative bacteria, not a single organism. CRITICAL RESULT CALLED TO, READ BACK BY AND VERIFIED WITH: PHARMD TERRY GREEN [id number] [facility]    Enterobacter cloacae complex NOT DETECTED NOT DETECTED Final    Escherichia coli DETECTED (A) NOT DETECTED Final    Comment: CRITICAL RESULT CALLED TO, READ BACK BY AND VERIFIED WITH: PHARMD TERRY GREEN 0913 [id number] [facility]    Klebsiella oxytoca NOT DETECTED NOT DETECTED Final   Klebsiella pneumoniae NOT DETECTED NOT DETECTED Final   Proteus species NOT DETECTED NOT DETECTED Final   Serratia marcescens NOT DETECTED NOT DETECTED Final   Carbapenem resistance NOT DETECTED NOT DETECTED Final   Haemophilus influenzae NOT DETECTED NOT DETECTED Final   Neisseria meningitidis NOT DETECTED NOT DETECTED Final   Pseudomonas aeruginosa NOT DETECTED NOT DETECTED Final   Candida albicans NOT DETECTED NOT DETECTED Final   Candida glabrata NOT DETECTED NOT DETECTED Final   Candida krusei NOT DETECTED NOT DETECTED Final   Candida parapsilosis NOT DETECTED NOT DETECTED Final   Candida tropicalis NOT DETECTED NOT DETECTED Final  Blood Culture (routine x 2)     Status: None (Preliminary result)   Collection Time: 10/27/18  9:14 PM  Result Value Ref Range Status   Specimen Description   Final    BLOOD RIGHT ANTECUBITAL Performed at [facility], [place] [address] [place], [place] [address]    Special Requests   Final    BOTTLES DRAWN AEROBIC ONLY Blood Culture adequate volume Performed at [facility] [address], [place], [place] [address]    Culture   Final    NO GROWTH 2 DAYS Performed at [facility] [address] [place], [place] [address]    Report Status PENDING  Incomplete  Culture, body fluid-bottle     Status: None (Preliminary result)   Collection Time: 10/28/18  6:55 PM  Result Value Ref Range Status   Specimen Description PERITONEAL  Final   Special Requests NONE  Final   Culture   Final    NO GROWTH 2 DAYS Performed at [facility], [address] [place], [place] [address]    Report Status PENDING  Incomplete  Gram stain     Status: None   Collection Time: 10/28/18  6:55 PM  Result Value  Ref Range Status   Specimen Description PERITONEAL  Final   Special Requests NONE  Final   Gram Stain   Final  FEW WBC PRESENT, PREDOMINANTLY PMN NO ORGANISMS SEEN Performed at [facility] [address] [place], [place] [address]    Report Status 10/28/2018 FINAL  Final     Studies: No results found.  Scheduled Meds: . feeding supplement (ENSURE ENLIVE)  237 mL Oral BID BM  . nadolol  20 mg Oral Daily  . pantoprazole  40 mg Oral Daily  . phytonadione  2.5 mg Oral Once  . sucralfate  1 g Oral TID WC & HS  . sulfamethoxazole-trimethoprim  1 tablet Oral Daily    Continuous Infusions: . cefTRIAXone (ROCEPHIN)  IV 200 mL/hr at 10/30/18 0600  . metronidazole 500 mg (10/30/18 1112)     Time spent: 68mns, case discussed with IR and palliative care I have personally reviewed and interpreted on  10/30/2018 daily labs, imagings as discussed above under date review session and assessment and plans.  I reviewed all nursing notes, pharmacy notes, consultant notes,  vitals, pertinent old records  I have discussed plan of care as described above with RN , patient and family on 10/30/2018   FFlorencia ReasonsMD, PhD  [facility] Pager [phone] If 7PM-7AM, please contact night-coverage at [email or web address], password [email or web address]1/13/2020, 3:25 PM  LOS: 3 days

## 2018-10-31 LAB — BASIC METABOLIC PANEL
Anion gap: 9 (ref 5–15)
BUN: 42 mg/dL — ABNORMAL HIGH (ref 8–23)
CO2: 14 mmol/L — AB (ref 22–32)
Calcium: 8 mg/dL — ABNORMAL LOW (ref 8.9–10.3)
Chloride: 107 mmol/L (ref 98–111)
Creatinine, Ser: 1.95 mg/dL — ABNORMAL HIGH (ref 0.61–1.24)
GFR calc Af Amer: 40 mL/min — ABNORMAL LOW (ref >60)
GFR calc non Af Amer: 35 mL/min — ABNORMAL LOW (ref >60)
Glucose, Bld: 72 mg/dL (ref 70–99)
Potassium: 4.3 mmol/L (ref 3.5–5.1)
Sodium: 130 mmol/L — ABNORMAL LOW (ref 135–145)

## 2018-10-31 LAB — CBC WITH DIFFERENTIAL/PLATELET
Abs Immature Granulocytes: 0.22 10*3/uL — ABNORMAL HIGH (ref 0.00–0.07)
Basophils Absolute: 0 10*3/uL (ref 0.0–0.1)
Basophils Relative: 0 %
EOS PCT: 0 %
Eosinophils Absolute: 0 10*3/uL (ref 0.0–0.5)
HCT: 32 % — ABNORMAL LOW (ref 39.0–52.0)
Hemoglobin: 10.3 g/dL — ABNORMAL LOW (ref 13.0–17.0)
Immature Granulocytes: 2 %
Lymphocytes Relative: 15 %
Lymphs Abs: 1.8 10*3/uL (ref 0.7–4.0)
MCH: 31 pg (ref 26.0–34.0)
MCHC: 32.2 g/dL (ref 30.0–36.0)
MCV: 96.4 fL (ref 80.0–100.0)
Monocytes Absolute: 0.8 10*3/uL (ref 0.1–1.0)
Monocytes Relative: 7 %
Neutro Abs: 9 10*3/uL — ABNORMAL HIGH (ref 1.7–7.7)
Neutrophils Relative %: 76 %
Platelets: 144 10*3/uL — ABNORMAL LOW (ref 150–400)
RBC: 3.32 MIL/uL — ABNORMAL LOW (ref 4.22–5.81)
RDW: 21.2 % — ABNORMAL HIGH (ref 11.5–15.5)
WBC: 11.9 10*3/uL — ABNORMAL HIGH (ref 4.0–10.5)
nRBC: 0 % (ref 0.0–0.2)

## 2018-10-31 LAB — PROTIME-INR
INR: 2.3
Prothrombin Time: 25 seconds — ABNORMAL HIGH (ref 11.4–15.2)

## 2018-10-31 MED ORDER — PHYTONADIONE 5 MG PO TABS
5.0000 mg | ORAL_TABLET | Freq: Once | ORAL | Status: AC
  Administered 2018-10-31: 5 mg via ORAL
  Filled 2018-10-31: qty 1

## 2018-10-31 NOTE — Plan of Care (Signed)
  Problem: Clinical Measurements: Goal: Diagnostic test results will improve Outcome: Progressing   Problem: Clinical Measurements: Goal: Cardiovascular complication will be avoided Outcome: Progressing   Problem: Coping: Goal: Level of anxiety will decrease Outcome: Progressing   Problem: Pain Managment: Goal: General experience of comfort will improve Outcome: Progressing

## 2018-10-31 NOTE — Progress Notes (Signed)
PROGRESS NOTE  Peter Nelson LEX:[id number] DOB: 1951-01-01 DOA: 10/27/2018 PCP: Lanae Boast, FNP  HPI/Recap of past 24 hours:  Report ab pain, no fever, aaox3 today, very frail Awaiting INR to come down for peritoneal drain placement  Assessment/Plan: Principal Problem:   SBP (spontaneous bacterial peritonitis) ([facility]) Active Problems:   Chronic hepatitis C without hepatic coma (HCC)   Alcoholic cirrhosis of liver (HCC)   Hepatocellular carcinoma (HCC)   Esophageal varices in alcoholic cirrhosis (HCC)   HTN (hypertension)   Protein-calorie malnutrition, severe   AKI (acute kidney injury) ([place])   Bacteremia due to Escherichia coli   FTT (failure to thrive) in adult  Sepsis/SBP/ecoli bacteremia/acute metabolic encephalopathy -presented with fever, leukocytosis, lactic acidosis, confusion  -improving, currently on iv rocephin/flagyl, oral bactrium ( he is on oral bactrim daily at home for sbp prophylaxis due to h/o mrsa in ascites fluids). -leukocytosis and confusion improved  AKI on CKDII -cr 1.63 on admission, nadir at 1.06 , now trending up with poor urine output, concerning for hepatorenal  -renal dosing meds  Hyponatremia in a cirrhotic patient with ascites -sodium 130-131-132  Anemia: Likely combination of anemia of chronic disease and intermittent gi loss Has black stool on 1/12, hgb nadir at 8.2, today at 9.4  continue carafate  home with home hospice after peritoneal drain placement  Thrombocytopenia From infection with underline liver disease plt nadir at 87, improving, today is 144  Coagulopathy INR 2.8, oral vitk on 1/13 and 1/14 In preparation of peritoneal drain placement Monitor INR,   Hepatocellular carcinoma  Chronic hepatitis C without hepatic coma/alcohol cirrhosis/esophageal varices/recurrent ascites Per family patient has received paracentesis almost 10times all together, he got three paracentesis last week, Not on Lasix/spironolatone due to  renal function and bp issues Currently on nadolol Has been on nexavar IR consulted for palliative drain placement, input appreciated  Severe malnutrition due to chronic disease/underweight  Family reports ongoing weight loss  FTT:  has been in bed most of the day and night, per ex wife patient has been in bed 22hr out of 24hrs most of the time.   Patient wants his daughter and ex wife to be his HPOA , he does not want  his sister to be HPOA Chaplain consulted ,  living will updated Family confirmed DNR status Palliative care consulted for goals of care, patient will benefit from comfort measures and hospice, he does not want to go to a facility, he wants to go home with his ex wife, ex wife works third shift as Chartered certified accountant  at [employer] long Palliative care consulted, input appreciated  Prognosis guarded , days to weeks  Code Status: DNR  Family Communication: patient and ex wife at bedside on 1/11 and 1/12  Disposition Plan: discharge to home with  home hospice after peritoneal drain placement    Consultants:  IR  Palliative care  Procedures:  US guided paracentesis on 1/11  Antibiotics:  As above   Objective: BP 121/79   Pulse 93   Temp 98.3 F (36.8 C)   Resp 10   Ht 5' 6"  (1.676 m)   Wt 55.9 kg   SpO2 100%   BMI 19.89 kg/m   Intake/Output Summary (Last 24 hours) at 10/31/2018 1239 Last data filed at 10/31/2018 0834 Gross per 24 hour  Intake 320 ml  Output 200 ml  Net 120 ml   Filed Weights   10/27/18 1707  Weight: 55.9 kg    Exam: Patient is examined daily  including today on 10/31/2018, exams remain the same as of yesterday except that has changed    General:  Thin, malnourished, frail, chronically ill, aaox3  Cardiovascular: RRR  Respiratory: CTABL  Abdomen: mild distension, mild lower abdomen tender, but still soft,positive BS  Musculoskeletal: No Edema  Neuro: alert, aaox3  Data Reviewed: Basic Metabolic Panel: Recent Labs  Lab  10/27/18 1745 10/28/18 0417 10/29/18 0329 10/30/18 0403 10/31/18 0500  NA 130* 131* 130* 132* 130*  K 4.4 4.0 4.2 4.0 4.3  CL 102 108 108 108 107  CO2 18* 16* 17* 17* 14*  GLUCOSE 95 115* 100* 99 72  BUN 38* 33* 29* 30* 42*  CREATININE 1.63* 1.28* 1.06 1.17 1.95*  CALCIUM 8.4* 7.4* 7.3* 7.4* 8.0*  MG  --   --  1.6* 2.1  --    Liver Function Tests: Recent Labs  Lab 10/27/18 1745 10/28/18 0417  AST 135* 91*  ALT 60* 44  ALKPHOS 207* 140*  BILITOT 5.8* 4.1*  PROT 8.7* 6.3*  ALBUMIN 2.1* 1.5*   Recent Labs  Lab 10/27/18 1745  LIPASE 38   Recent Labs  Lab 10/27/18 1745  AMMONIA 39*   CBC: Recent Labs  Lab 10/27/18 1745 10/28/18 0417 10/29/18 0329 10/30/18 0403 10/31/18 0500  WBC 11.6* 15.3* 11.5* 10.0 11.9*  NEUTROABS 10.5*  --  8.5* 6.8 9.0*  HGB 12.5* 9.1* 8.2* 9.4* 10.3*  HCT 38.1* 28.8* 24.7* 29.0* 32.0*  MCV 92.7 95.7 91.5 95.1 96.4  PLT 151 95* 87* 109* 144*   Cardiac Enzymes:   No results for input(s): CKTOTAL, CKMB, CKMBINDEX, TROPONINI in the last 168 hours. BNP (last 3 results) Recent Labs    09/12/18 1029  BNP 311.9*    ProBNP (last 3 results) No results for input(s): PROBNP in the last 8760 hours.  CBG: No results for input(s): GLUCAP in the last 168 hours.  Recent Results (from the past 240 hour(s))  Urine culture     Status: Abnormal   Collection Time: 10/27/18  5:45 PM  Result Value Ref Range Status   Specimen Description URINE, RANDOM  Final   Special Requests   Final    NONE Performed at [facility] [address] [place], [place] [address]    Culture <10,000 COLONIES/mL INSIGNIFICANT GROWTH (A)  Final   Report Status 10/29/2018 FINAL  Final  Blood Culture (routine x 2)     Status: Abnormal   Collection Time: 10/27/18  5:58 PM  Result Value Ref Range Status   Specimen Description   Final    BLOOD BLOOD LEFT ARM Performed at [facility] [address], [place], [place] [address]     Special Requests   Final    BOTTLES DRAWN AEROBIC AND ANAEROBIC Blood Culture adequate volume Performed at [facility] [address], [place], [place] [address]    Culture  Setup Time   Final    GRAM NEGATIVE RODS IN BOTH AEROBIC AND ANAEROBIC BOTTLES CRITICAL RESULT CALLED TO, READ BACK BY AND VERIFIED WITH: PHARMD TERRY GREEN 0913 Performed at [facility], [place] [address] [place],  [address]    Culture ESCHERICHIA COLI (A)  Final   Report Status 10/30/2018 FINAL  Final   Organism ID, Bacteria ESCHERICHIA COLI  Final      Susceptibility   Escherichia coli - MIC*    AMPICILLIN >=32 RESISTANT Resistant     CEFAZOLIN <=4 SENSITIVE Sensitive     CEFEPIME <=  1 SENSITIVE Sensitive     CEFTAZIDIME <=1 SENSITIVE Sensitive     CEFTRIAXONE <=1 SENSITIVE Sensitive     CIPROFLOXACIN <=0.25 SENSITIVE Sensitive     GENTAMICIN <=1 SENSITIVE Sensitive     IMIPENEM <=0.25 SENSITIVE Sensitive     TRIMETH/SULFA >=320 RESISTANT Resistant     AMPICILLIN/SULBACTAM 16 INTERMEDIATE Intermediate     PIP/TAZO <=4 SENSITIVE Sensitive     Extended ESBL NEGATIVE Sensitive     * ESCHERICHIA COLI  Blood Culture ID Panel (Reflexed)     Status: Abnormal   Collection Time: 10/27/18  5:58 PM  Result Value Ref Range Status   Enterococcus species NOT DETECTED NOT DETECTED Final   Listeria monocytogenes NOT DETECTED NOT DETECTED Final   Staphylococcus species NOT DETECTED NOT DETECTED Final   Staphylococcus aureus (BCID) NOT DETECTED NOT DETECTED Final   Streptococcus species NOT DETECTED NOT DETECTED Final   Streptococcus agalactiae NOT DETECTED NOT DETECTED Final   Streptococcus pneumoniae NOT DETECTED NOT DETECTED Final   Streptococcus pyogenes NOT DETECTED NOT DETECTED Final   Acinetobacter baumannii NOT DETECTED NOT DETECTED Final   Enterobacteriaceae species DETECTED (A) NOT DETECTED Final    Comment: Enterobacteriaceae represent a large family of gram-negative  bacteria, not a single organism. CRITICAL RESULT CALLED TO, READ BACK BY AND VERIFIED WITH: PHARMD TERRY GREEN [id number] [facility]    Enterobacter cloacae complex NOT DETECTED NOT DETECTED Final   Escherichia coli DETECTED (A) NOT DETECTED Final    Comment: CRITICAL RESULT CALLED TO, READ BACK BY AND VERIFIED WITH: PHARMD TERRY GREEN 0913 [id number] [facility]    Klebsiella oxytoca NOT DETECTED NOT DETECTED Final   Klebsiella pneumoniae NOT DETECTED NOT DETECTED Final   Proteus species NOT DETECTED NOT DETECTED Final   Serratia marcescens NOT DETECTED NOT DETECTED Final   Carbapenem resistance NOT DETECTED NOT DETECTED Final   Haemophilus influenzae NOT DETECTED NOT DETECTED Final   Neisseria meningitidis NOT DETECTED NOT DETECTED Final   Pseudomonas aeruginosa NOT DETECTED NOT DETECTED Final   Candida albicans NOT DETECTED NOT DETECTED Final   Candida glabrata NOT DETECTED NOT DETECTED Final   Candida krusei NOT DETECTED NOT DETECTED Final   Candida parapsilosis NOT DETECTED NOT DETECTED Final   Candida tropicalis NOT DETECTED NOT DETECTED Final  Blood Culture (routine x 2)     Status: None (Preliminary result)   Collection Time: 10/27/18  9:14 PM  Result Value Ref Range Status   Specimen Description   Final    BLOOD RIGHT ANTECUBITAL Performed at [facility], [place] [address], [place], [place] [address]    Special Requests   Final    BOTTLES DRAWN AEROBIC ONLY Blood Culture adequate volume Performed at [facility] [address], [place], [place] [address]    Culture   Final    NO GROWTH 2 DAYS Performed at [facility] [address], [place], [place] [address]    Report Status PENDING  Incomplete  Culture, body fluid-bottle     Status: None (Preliminary result)   Collection Time: 10/28/18  6:55 PM  Result Value Ref Range Status   Specimen Description PERITONEAL  Final   Special Requests NONE  Final   Culture   Final    NO GROWTH 2  DAYS Performed at [facility], [address], [place], [place] [address]    Report Status PENDING  Incomplete  Gram stain     Status: None   Collection Time: 10/28/18  6:55 PM  Result Value Ref Range Status   Specimen Description PERITONEAL  Final   Special Requests NONE  Final   Gram Stain   Final    FEW WBC PRESENT, PREDOMINANTLY PMN NO ORGANISMS SEEN Performed at [facility], [address] [place], [place] [address]    Report Status 10/28/2018 FINAL  Final  Culture, blood (routine x 2)     Status: None (Preliminary result)   Collection Time: 10/30/18 12:32 PM  Result Value Ref Range Status   Specimen Description   Final    BLOOD RIGHT ARM Performed at [facility] [address], [place], [place] [address]    Special Requests   Final    BOTTLES DRAWN AEROBIC ONLY Blood Culture results may not be optimal due to an inadequate volume of blood received in culture bottles Performed at [facility] [address] [place], [place] [address]    Culture PENDING  Incomplete   Report Status PENDING  Incomplete     Studies: No results found.  Scheduled Meds: . feeding supplement (ENSURE ENLIVE)  237 mL Oral BID BM  . nadolol  20 mg Oral Daily  . pantoprazole  40 mg Oral Daily  . sucralfate  1 g Oral TID WC & HS  . sulfamethoxazole-trimethoprim  1 tablet Oral Daily    Continuous Infusions: . cefTRIAXone (ROCEPHIN)  IV 2 g (10/31/18 6294)  . metronidazole 500 mg (10/31/18 1215)     Time spent: 62mns,  I have personally reviewed and interpreted on  10/31/2018 daily labs, imagings as discussed above under date review session and assessment and plans.  I reviewed all nursing notes, pharmacy notes, consultant notes,  vitals, pertinent old records  I have discussed plan of care as described above with RN , patient  on 10/31/2018   FFlorencia ReasonsMD, PhD  [facility] Pager [phone] If 7PM-7AM, please contact night-coverage at  [email or web address], password [email or web address]1/14/2020, 12:39 PM  LOS: 4 days

## 2018-10-31 NOTE — Progress Notes (Signed)
Patient was kept NPO post midnight. Patient is alert, awake, needs longer time to respond. Respirations are deep and slow. Pain is managed with IV Dilaudid. Urine output is very low, cola color. Monitoring continues.

## 2018-10-31 NOTE — Progress Notes (Signed)
Spoke with patient at bedside and exwife on the phone. They were requesting hospice list for choice. Provide list to patient. Discussed using [facility], informed them that this would be out of pocket, not covered by insurance. They were appreciative of information.

## 2018-10-31 NOTE — Progress Notes (Signed)
Patient ID: Peter Nelson, male   DOB: [birth year], 68 y.o.   MRN: [id number] Patient's PT/INR today 25/2.3 which is still too high to safely place peritoneal drain. Follow up blood cx pend as well. We will continue to monitor.

## 2018-11-01 ENCOUNTER — Inpatient Hospital Stay (HOSPITAL_COMMUNITY): Payer: Medicare HMO

## 2018-11-01 DIAGNOSIS — N179 Acute kidney failure, unspecified: Secondary | ICD-10-CM

## 2018-11-01 LAB — COMPREHENSIVE METABOLIC PANEL
ALT: 26 U/L (ref 0–44)
AST: 55 U/L — ABNORMAL HIGH (ref 15–41)
Albumin: 1.5 g/dL — ABNORMAL LOW (ref 3.5–5.0)
Alkaline Phosphatase: 25 U/L — ABNORMAL LOW (ref 38–126)
Anion gap: 16 — ABNORMAL HIGH (ref 5–15)
BUN: 64 mg/dL — ABNORMAL HIGH (ref 8–23)
CALCIUM: 8 mg/dL — AB (ref 8.9–10.3)
CHLORIDE: 104 mmol/L (ref 98–111)
CO2: 11 mmol/L — ABNORMAL LOW (ref 22–32)
CREATININE: 3.11 mg/dL — AB (ref 0.61–1.24)
GFR calc Af Amer: 23 mL/min — ABNORMAL LOW (ref >60)
GFR calc non Af Amer: 20 mL/min — ABNORMAL LOW (ref >60)
Glucose, Bld: 104 mg/dL — ABNORMAL HIGH (ref 70–99)
Potassium: 4.6 mmol/L (ref 3.5–5.1)
Sodium: 131 mmol/L — ABNORMAL LOW (ref 135–145)
Total Bilirubin: 7.8 mg/dL — ABNORMAL HIGH (ref 0.3–1.2)
Total Protein: 6.5 g/dL (ref 6.5–8.1)

## 2018-11-01 LAB — CBC WITH DIFFERENTIAL/PLATELET
Abs Immature Granulocytes: 0.23 10*3/uL — ABNORMAL HIGH (ref 0.00–0.07)
Basophils Absolute: 0 10*3/uL (ref 0.0–0.1)
Basophils Relative: 0 %
Eosinophils Absolute: 0 10*3/uL (ref 0.0–0.5)
Eosinophils Relative: 0 %
HCT: 27.8 % — ABNORMAL LOW (ref 39.0–52.0)
Hemoglobin: 9.1 g/dL — ABNORMAL LOW (ref 13.0–17.0)
Immature Granulocytes: 2 %
Lymphocytes Relative: 15 %
Lymphs Abs: 2.2 10*3/uL (ref 0.7–4.0)
MCH: 30.2 pg (ref 26.0–34.0)
MCHC: 32.7 g/dL (ref 30.0–36.0)
MCV: 92.4 fL (ref 80.0–100.0)
Monocytes Absolute: 1.1 10*3/uL — ABNORMAL HIGH (ref 0.1–1.0)
Monocytes Relative: 7 %
Neutro Abs: 11.4 10*3/uL — ABNORMAL HIGH (ref 1.7–7.7)
Neutrophils Relative %: 76 %
Platelets: 131 10*3/uL — ABNORMAL LOW (ref 150–400)
RBC: 3.01 MIL/uL — ABNORMAL LOW (ref 4.22–5.81)
RDW: 21.2 % — ABNORMAL HIGH (ref 11.5–15.5)
WBC: 15 10*3/uL — ABNORMAL HIGH (ref 4.0–10.5)
nRBC: 0 % (ref 0.0–0.2)

## 2018-11-01 LAB — PROTIME-INR
INR: 3.04
Prothrombin Time: 31 seconds — ABNORMAL HIGH (ref 11.4–15.2)

## 2018-11-01 MED ORDER — DOXYCYCLINE HYCLATE 100 MG PO TABS
100.0000 mg | ORAL_TABLET | Freq: Two times a day (BID) | ORAL | Status: DC
  Administered 2018-11-01 – 2018-11-02 (×2): 100 mg via ORAL
  Filled 2018-11-01 (×3): qty 1

## 2018-11-01 MED ORDER — PHYTONADIONE 5 MG PO TABS
5.0000 mg | ORAL_TABLET | Freq: Every day | ORAL | Status: DC
  Administered 2018-11-01: 5 mg via ORAL
  Filled 2018-11-01: qty 1

## 2018-11-01 MED ORDER — SODIUM CHLORIDE 0.9 % IV SOLN
INTRAVENOUS | Status: DC
  Administered 2018-11-01 – 2018-11-02 (×3): via INTRAVENOUS

## 2018-11-01 MED ORDER — SODIUM CHLORIDE 0.9 % IV SOLN
INTRAVENOUS | Status: DC | PRN
  Administered 2018-11-01: 500 mL via INTRAVENOUS

## 2018-11-01 MED ORDER — PHYTONADIONE 5 MG PO TABS
5.0000 mg | ORAL_TABLET | Freq: Once | ORAL | Status: AC
  Administered 2018-11-01: 5 mg via ORAL
  Filled 2018-11-01: qty 1

## 2018-11-01 NOTE — Progress Notes (Signed)
   11/01/18 1559  Clinical Encounter Type  Visited With Family  Visit Type Follow-up  Referral From Chaplain  Consult/Referral To Chaplain  The chaplain checked in with RN-Brianna and PMT-GF before entering the Pt. room. The chaplain followed up and provided spiritual care for the Pt.ex wife-Angela outside the Pt. room. Levada Dy communicated to the chaplain her desire and the same desire of the Pt., to transition to residential Hospice in [place].  Levada Dy stated as the primary caregiver, residential Hospice may better care for the Pt. and allow the caregiver to better care for herself. Levada Dy is aware the Pt. daughter-HCPOA will need to be contacted.  The chaplain communicated the conversation to the PMT physician GF. The chaplain will provide F/U spiritual care as needed.

## 2018-11-01 NOTE — Progress Notes (Signed)
Palliative Care Progress Note  Reason for consult: Goals of care in light of hepatocellular carcinoma  I met today with Peter Nelson and his ex-wife. We discussed clinical course as well as wishes moving forward in regard to his care plan.    Plan was for peritoneal drain placement, but his is still with increased INR.  His course is now further complicated by increased bilirubin and renal failure.  Initially, he was hopeful to transition to his ex-wife's house with hospice support.  She expressed concern with being able to provide care that he needs, and we discussed how recent changes will further complicate his care moving forward.    - We discussed possibility of residential hospice.  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  If his disease continues along this course, I believe that his prognosis is likely < 2 weeks.   - He would like for his wife to tour residential hospice facilities.  He reports that he is willing to transition to residential hospice if his wife thinks he will be well served there after visiting facility.  Total time: 40 minutes Greater than 50%  of this time was spent counseling and coordinating care related to the above assessment and plan.  Micheline Rough, MD [place] Team [phone]

## 2018-11-01 NOTE — Progress Notes (Signed)
PROGRESS NOTE    Peter Nelson  BJY:[id number] DOB: 05/20/1951 DOA: 10/27/2018 PCP: Lanae Boast, FNP    Brief Narrative: 68 year old with past medical history significant for alcohol abuse, hepatitis C, cirrhosis, hepatocellular carcinoma, ascites requiring recurrent paracentesis, esophageal varices with bleed, recent admission last month for MRSA SBP.  Presented with generalized diffuse abdominal pain he had paracentesis 2 days prior to admission.  He presented with a temperature of 101.5 mild leukocytosis  Assessment & Plan:   Principal Problem:   SBP (spontaneous bacterial peritonitis) ([place]) Active Problems:   Chronic hepatitis C without hepatic coma (HCC)   Alcoholic cirrhosis of liver (HCC)   Hepatocellular carcinoma (HCC)   Esophageal varices in alcoholic cirrhosis (HCC)   HTN (hypertension)   Protein-calorie malnutrition, severe   AKI (acute kidney injury) ([place])   Bacteremia due to Escherichia coli   FTT (failure to thrive) in adult  Sepsis/SBP/E. coli bacteremia; Presented with fever leukocytosis lactic acidosis. Continue with IV ceftriaxone and Flagyl. Will stop Bactrim due to AKI.  Will start doxycycline  AKI on chronic kidney disease Concern for hepatorenal syndrome, also hypovolemia dehydration. Creatinine has increased to 3 today. We will start IV fluids.  Hyponatremia in the setting of cirrhosis with ascites. Monitor.  Anemia of chronic disease and intermittent GI loss. Continue to monitor hemoglobin.  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 Monitor.  Coagulopathy INR increased to 3.  Will repeat dose of vitamin K.  Hepatocellular carcinoma/chronic hepatitis C, alcoholic cirrhosis, esophageal varices, recurrent ascites.  hyperbilirubinemia Patient has received multiple paracentesis more than 8 times. IR was consulted for palliative drain placement. Delay in placement due to increase INR. -Liver function tests worse today,  bilirubin increased to 7.  Will get an ultrasound to further evaluate. Palliative to follow for further discussion with patient and his POA.   Severe malnutrition due to chronic disease/underweight  Family reports ongoing weight loss  FTT; Patient met with palliative he did not want to go to residential hospice.  Was made DNR. Estimated body mass index is 19.89 kg/m as calculated from the following:   Height as of this encounter: 5' 6"  (1.676 m).   Weight as of this encounter: 55.9 kg.   DVT prophylaxis: scd Code Status: DNR Family Communication; ex-wife at bedside.  Disposition Plan: family asking about residential hospice placement.   Consultants:   Palliative  IR   Procedures:   none   Antimicrobials:   none   Subjective: He denies abdominal pain.  He is weak.    Objective: Vitals:   10/31/18 2047 11/01/18 0530 11/01/18 0612 11/01/18 1429  BP:  103/60  95/69  Pulse:  66  70  Resp: 14 (!) 6 12 16   Temp:  (!) 97.4 F (36.3 C)    TempSrc:      SpO2:  100%  100%  Weight:      Height:        Intake/Output Summary (Last 24 hours) at 11/01/2018 1701 Last data filed at 11/01/2018 1355 Gross per 24 hour  Intake 750 ml  Output 300 ml  Net 450 ml   Filed Weights   10/27/18 1707  Weight: 55.9 kg    Examination:  General exam: cachetic Respiratory system: Clear to auscultation. Respiratory effort normal. Cardiovascular system: S1 & S2 heard, RRR. No JVD, murmurs, rubs, gallops or clicks. No pedal edema. Gastrointestinal system: Abdomen is distended Central nervous system: Alert and oriented.  Extremities: Symmetric 5 x 5 power. Skin: No rashes, lesions  or ulcers   Data Reviewed: I have personally reviewed following labs and imaging studies  CBC: Recent Labs  Lab 10/27/18 1745 10/28/18 0417 10/29/18 0329 10/30/18 0403 10/31/18 0500 11/01/18 0329  WBC 11.6* 15.3* 11.5* 10.0 11.9* 15.0*  NEUTROABS 10.5*  --  8.5* 6.8 9.0* 11.4*  HGB 12.5*  9.1* 8.2* 9.4* 10.3* 9.1*  HCT 38.1* 28.8* 24.7* 29.0* 32.0* 27.8*  MCV 92.7 95.7 91.5 95.1 96.4 92.4  PLT 151 95* 87* 109* 144* 540*   Basic Metabolic Panel: Recent Labs  Lab 10/28/18 0417 10/29/18 0329 10/30/18 0403 10/31/18 0500 11/01/18 0329  NA 131* 130* 132* 130* 131*  K 4.0 4.2 4.0 4.3 4.6  CL 108 108 108 107 104  CO2 16* 17* 17* 14* 11*  GLUCOSE 115* 100* 99 72 104*  BUN 33* 29* 30* 42* 64*  CREATININE 1.28* 1.06 1.17 1.95* 3.11*  CALCIUM 7.4* 7.3* 7.4* 8.0* 8.0*  MG  --  1.6* 2.1  --   --    GFR: Estimated Creatinine Clearance: 18.2 mL/min (A) (by C-G formula based on SCr of 3.11 mg/dL (H)). Liver Function Tests: Recent Labs  Lab 10/27/18 1745 10/28/18 0417 11/01/18 0329  AST 135* 91* 55*  ALT 60* 44 26  ALKPHOS 207* 140* 25*  BILITOT 5.8* 4.1* 7.8*  PROT 8.7* 6.3* 6.5  ALBUMIN 2.1* 1.5* 1.5*   Recent Labs  Lab 10/27/18 1745  LIPASE 38   Recent Labs  Lab 10/27/18 1745  AMMONIA 39*   Coagulation Profile: Recent Labs  Lab 10/27/18 1745 10/29/18 0329 10/30/18 0403 10/31/18 0500 11/01/18 0329  INR 1.63 2.82 2.28 2.30 3.04   Cardiac Enzymes: No results for input(s): CKTOTAL, CKMB, CKMBINDEX, TROPONINI in the last 168 hours. BNP (last 3 results) No results for input(s): PROBNP in the last 8760 hours. HbA1C: No results for input(s): HGBA1C in the last 72 hours. CBG: No results for input(s): GLUCAP in the last 168 hours. Lipid Profile: No results for input(s): CHOL, HDL, LDLCALC, TRIG, CHOLHDL, LDLDIRECT in the last 72 hours. Thyroid Function Tests: No results for input(s): TSH, T4TOTAL, FREET4, T3FREE, THYROIDAB in the last 72 hours. Anemia Panel: No results for input(s): VITAMINB12, FOLATE, FERRITIN, TIBC, IRON, RETICCTPCT in the last 72 hours. Sepsis Labs: Recent Labs  Lab 10/27/18 1753 10/27/18 2114 10/27/18 2121 10/27/18 2357  LATICACIDVEN 4.07* 3.4* 3.39* 3.8*    Recent Results (from the past 240 hour(s))  Urine culture      Status: Abnormal   Collection Time: 10/27/18  5:45 PM  Result Value Ref Range Status   Specimen Description URINE, RANDOM  Final   Special Requests   Final    NONE Performed at [facility] [address] [place], [place] [address]    Culture <10,000 COLONIES/mL INSIGNIFICANT GROWTH (A)  Final   Report Status 10/29/2018 FINAL  Final  Blood Culture (routine x 2)     Status: Abnormal   Collection Time: 10/27/18  5:58 PM  Result Value Ref Range Status   Specimen Description   Final    BLOOD BLOOD LEFT ARM Performed at [facility] [address] [place], [place] [address]    Special Requests   Final    BOTTLES DRAWN AEROBIC AND ANAEROBIC Blood Culture adequate volume Performed at [facility] [address], [place], [place] [address]    Culture  Setup Time   Final    GRAM NEGATIVE RODS IN BOTH AEROBIC AND ANAEROBIC BOTTLES CRITICAL RESULT  CALLED TO, READ BACK BY AND VERIFIED WITH: PHARMD TERRY GREEN 0913 Performed at [facility], [place] [address] [place], [place] [address]    Culture ESCHERICHIA COLI (A)  Final   Report Status 10/30/2018 FINAL  Final   Organism ID, Bacteria ESCHERICHIA COLI  Final      Susceptibility   Escherichia coli - MIC*    AMPICILLIN >=32 RESISTANT Resistant     CEFAZOLIN <=4 SENSITIVE Sensitive     CEFEPIME <=1 SENSITIVE Sensitive     CEFTAZIDIME <=1 SENSITIVE Sensitive     CEFTRIAXONE <=1 SENSITIVE Sensitive     CIPROFLOXACIN <=0.25 SENSITIVE Sensitive     GENTAMICIN <=1 SENSITIVE Sensitive     IMIPENEM <=0.25 SENSITIVE Sensitive     TRIMETH/SULFA >=320 RESISTANT Resistant     AMPICILLIN/SULBACTAM 16 INTERMEDIATE Intermediate     PIP/TAZO <=4 SENSITIVE Sensitive     Extended ESBL NEGATIVE Sensitive     * ESCHERICHIA COLI  Blood Culture ID Panel (Reflexed)     Status: Abnormal   Collection Time: 10/27/18  5:58 PM  Result Value Ref Range Status   Enterococcus species NOT DETECTED  NOT DETECTED Final   Listeria monocytogenes NOT DETECTED NOT DETECTED Final   Staphylococcus species NOT DETECTED NOT DETECTED Final   Staphylococcus aureus (BCID) NOT DETECTED NOT DETECTED Final   Streptococcus species NOT DETECTED NOT DETECTED Final   Streptococcus agalactiae NOT DETECTED NOT DETECTED Final   Streptococcus pneumoniae NOT DETECTED NOT DETECTED Final   Streptococcus pyogenes NOT DETECTED NOT DETECTED Final   Acinetobacter baumannii NOT DETECTED NOT DETECTED Final   Enterobacteriaceae species DETECTED (A) NOT DETECTED Final    Comment: Enterobacteriaceae represent a large family of gram-negative bacteria, not a single organism. CRITICAL RESULT CALLED TO, READ BACK BY AND VERIFIED WITH: PHARMD TERRY GREEN [id number] F[facility]    Enterobacter cloacae complex NOT DETECTED NOT DETECTED Final   Escherichia coli DETECTED (A) NOT DETECTED Final    Comment: CRITICAL RESULT CALLED TO, READ BACK BY AND VERIFIED WITH: PHARMD TERRY GREEN [id number] F[facility]    Klebsiella oxytoca NOT DETECTED NOT DETECTED Final   Klebsiella pneumoniae NOT DETECTED NOT DETECTED Final   Proteus species NOT DETECTED NOT DETECTED Final   Serratia marcescens NOT DETECTED NOT DETECTED Final   Carbapenem resistance NOT DETECTED NOT DETECTED Final   Haemophilus influenzae NOT DETECTED NOT DETECTED Final   Neisseria meningitidis NOT DETECTED NOT DETECTED Final   Pseudomonas aeruginosa NOT DETECTED NOT DETECTED Final   Candida albicans NOT DETECTED NOT DETECTED Final   Candida glabrata NOT DETECTED NOT DETECTED Final   Candida krusei NOT DETECTED NOT DETECTED Final   Candida parapsilosis NOT DETECTED NOT DETECTED Final   Candida tropicalis NOT DETECTED NOT DETECTED Final  Blood Culture (routine x 2)     Status: None (Preliminary result)   Collection Time: 10/27/18  9:14 PM  Result Value Ref Range Status   Specimen Description   Final    BLOOD RIGHT ANTECUBITAL Performed at [facility], [place]  [address] [place], [place] [address]    Special Requests   Final    BOTTLES DRAWN AEROBIC ONLY Blood Culture adequate volume Performed at [facility] [address] [place], [place] [address]    Culture   Final    NO GROWTH 4 DAYS Performed at [facility], [place] [address], [place], [place] [address]    Report Status PENDING  Incomplete  Culture, body fluid-bottle  Status: None (Preliminary result)   Collection Time: 10/28/18  6:55 PM  Result Value Ref Range Status   Specimen Description PERITONEAL  Final   Special Requests NONE  Final   Culture   Final    NO GROWTH 4 DAYS Performed at [facility], [address] [place], [place] [address]    Report Status PENDING  Incomplete  Gram stain     Status: None   Collection Time: 10/28/18  6:55 PM  Result Value Ref Range Status   Specimen Description PERITONEAL  Final   Special Requests NONE  Final   Gram Stain   Final    FEW WBC PRESENT, PREDOMINANTLY PMN NO ORGANISMS SEEN Performed at [facility], [place] [address] [place], [place] [address]    Report Status 10/28/2018 FINAL  Final  Culture, blood (routine x 2)     Status: None (Preliminary result)   Collection Time: 10/30/18 12:32 PM  Result Value Ref Range Status   Specimen Description   Final    BLOOD RIGHT ARM Performed at [facility] [address], [place], [place] [address]    Special Requests   Final    BOTTLES DRAWN AEROBIC ONLY Blood Culture results may not be optimal due to an inadequate volume of blood received in culture bottles   Culture   Final    NO GROWTH 2 DAYS Performed at [facility], [place] [address] [place], [place] [address]    Report Status PENDING  Incomplete  Culture, blood (routine x 2)     Status: None (Preliminary result)   Collection Time: 10/30/18 12:41 PM  Result Value Ref Range Status   Specimen Description   Final    BLOOD RIGHT ARM Performed at [facility] [address] [place], [place] [address]    Special Requests   Final    BOTTLES DRAWN AEROBIC ONLY Blood Culture adequate volume Performed at [facility] [address] [place], [place] [address]    Culture   Final    NO GROWTH 2 DAYS Performed at [facility] [address] [place], [place] [address]    Report Status PENDING  Incomplete         Radiology Studies: No results found.      Scheduled Meds: . feeding supplement (ENSURE ENLIVE)  237 mL Oral BID BM  . nadolol  20 mg Oral Daily  . pantoprazole  40 mg Oral Daily  . phytonadione  5 mg Oral Daily  . sucralfate  1 g Oral TID WC & HS  . sulfamethoxazole-trimethoprim  1 tablet Oral Daily   Continuous Infusions: . sodium chloride 500 mL (11/01/18 0215)  . sodium chloride 75 mL/hr at 11/01/18 1355  . cefTRIAXone (ROCEPHIN)  IV 2 g (11/01/18 0557)  . metronidazole 500 mg (11/01/18 1201)     LOS: 5 days    Time spent: 35 minutes.     Elmarie Shiley, MD [facility]   If 7PM-7AM, please contact night-coverage [email or web address] Password [facility] 11/01/2018, 5:01 PM

## 2018-11-02 LAB — COMPREHENSIVE METABOLIC PANEL
ALT: 33 U/L (ref 0–44)
AST: 96 U/L — ABNORMAL HIGH (ref 15–41)
Albumin: 1.5 g/dL — ABNORMAL LOW (ref 3.5–5.0)
Alkaline Phosphatase: 104 U/L (ref 38–126)
Anion gap: 10 (ref 5–15)
BUN: 79 mg/dL — ABNORMAL HIGH (ref 8–23)
CHLORIDE: 106 mmol/L (ref 98–111)
CO2: 13 mmol/L — ABNORMAL LOW (ref 22–32)
Calcium: 7.3 mg/dL — ABNORMAL LOW (ref 8.9–10.3)
Creatinine, Ser: 3.21 mg/dL — ABNORMAL HIGH (ref 0.61–1.24)
GFR calc Af Amer: 22 mL/min — ABNORMAL LOW (ref >60)
GFR calc non Af Amer: 19 mL/min — ABNORMAL LOW (ref >60)
Glucose, Bld: 86 mg/dL (ref 70–99)
Potassium: 5.2 mmol/L — ABNORMAL HIGH (ref 3.5–5.1)
Sodium: 129 mmol/L — ABNORMAL LOW (ref 135–145)
Total Bilirubin: 5.9 mg/dL — ABNORMAL HIGH (ref 0.3–1.2)
Total Protein: 6.1 g/dL — ABNORMAL LOW (ref 6.5–8.1)

## 2018-11-02 LAB — PROTIME-INR
INR: 3.22
Prothrombin Time: 32.4 seconds — ABNORMAL HIGH (ref 11.4–15.2)

## 2018-11-02 LAB — CBC
HCT: 29.2 % — ABNORMAL LOW (ref 39.0–52.0)
Hemoglobin: 9.5 g/dL — ABNORMAL LOW (ref 13.0–17.0)
MCH: 30.5 pg (ref 26.0–34.0)
MCHC: 32.5 g/dL (ref 30.0–36.0)
MCV: 93.9 fL (ref 80.0–100.0)
Platelets: 153 10*3/uL (ref 150–400)
RBC: 3.11 MIL/uL — ABNORMAL LOW (ref 4.22–5.81)
RDW: 21.6 % — ABNORMAL HIGH (ref 11.5–15.5)
WBC: 12.7 10*3/uL — ABNORMAL HIGH (ref 4.0–10.5)
nRBC: 0.2 % (ref 0.0–0.2)

## 2018-11-02 LAB — CULTURE, BLOOD (ROUTINE X 2)
Culture: NO GROWTH
Special Requests: ADEQUATE

## 2018-11-02 LAB — CULTURE, BODY FLUID W GRAM STAIN -BOTTLE: Culture: NO GROWTH

## 2018-11-02 MED ORDER — OXYCODONE HCL 5 MG PO TABS: 5.0000 mg | ORAL_TABLET | Freq: Four times a day (QID) | ORAL | 0 refills | Status: DC | PRN

## 2018-11-02 MED ORDER — DOXYCYCLINE HYCLATE 100 MG PO TABS: 100.0000 mg | ORAL_TABLET | Freq: Two times a day (BID) | ORAL | 0 refills | Status: AC

## 2018-11-02 MED ORDER — OXYCODONE HCL 5 MG PO TABS: 5.0000 mg | ORAL_TABLET | Freq: Four times a day (QID) | ORAL | 0 refills | Status: AC | PRN

## 2018-11-02 MED ORDER — SODIUM ZIRCONIUM CYCLOSILICATE 5 G PO PACK
5.0000 g | PACK | Freq: Once | ORAL | Status: DC
  Filled 2018-11-02: qty 1

## 2018-11-02 NOTE — Progress Notes (Signed)
Palliative Care Progress Note  Reason for consult: Goals of care in light of hepatocellular carcinoma  Chart reviewed including continued worsening of his Cr.  While WBC and bilirubin are minimally improved, there is not a significant clinical change and he appears weaker on exam today.  I met today with Mr. Slauson and his ex-wife.  His ex-wife was planning to [place] today, but reports that she was too exhausted when she finished working 3rd shift this morning to drive there.  While Mr. Arocho had been hopeful to transition to his ex-wife's house with hospice support, his clinical condition is changing quickly.  Yesterday, his wife expressed concern with being able to provide care that he needs, and she is clear today that going home is not an option.    - Mr. Worlds and his ex-wife/HCPOA are in agreement with seeking placement at residential hospice.  He is developing hepatorenal syndrome and had significant increase in his Cr yesterday and it continues to climb.  He has been septic with recurrent bacteremia/SBP.  Plan to forgo drain placement and stop fluids as he will continue to get more overloaded.  If his disease continues along this course, I believe his prognosis is < 2 weeks with likely increase in symptom burden.  I believe that he would be best served by transition to residential hospice for EOL care if it can be arranged.  Total time: 30 minutes Greater than 50%  of this time was spent counseling and coordinating care related to the above assessment and plan.  Micheline Rough, MD [facility] [phone]

## 2018-11-02 NOTE — Care Management Important Message (Signed)
Important Message  Patient Details  Name: ALEXZAVIER GIRARDIN MRN: [id number] Date of Birth: [birth year]   Medicare Important Message Given:  Yes    Kerin Salen 11/02/2018, 12:37 [facility] Message  Patient Details  Name: OTHELL JAIME MRN: [id number] Date of Birth: 12-02-1950   Medicare Important Message Given:  Yes    Kerin Salen 11/02/2018, 12:37 PM

## 2018-11-02 NOTE — Progress Notes (Addendum)
[facility])   Received request from [place] for family interest in [facility] with request for transfer.  Chart reviewed, we have a bed to offer today.    Met with Levada Dy (spouse) and patient confirm interest and explain services.  Family agreeable to transfer today.  Registration paper work completed.    Dr. Orpah Melter to assume care per family request.   Please fax discharge summary to [phone].   RN staff:  please call report to [phone].   Thank you, Venia Carbon BSN, [facility] Liaison (listed in [place]) [phone]

## 2018-11-02 NOTE — Discharge Summary (Signed)
Physician Discharge Summary  Peter Nelson BHA:[id number] DOB: 04-19-1951 DOA: 10/27/2018  PCP: Lanae Boast, FNP  Admit date: 10/27/2018 Discharge date: 11/02/2018  Admitted From: Home  Disposition:  Residential Hospice.   Recommendations for Outpatient Follow-up:  Patient will be discharge to residential hospice for comfort care.    Discharge Condition: guarded.  CODE STATUS: DNR Diet recommendation: comfort feeding.   Brief/Interim Summary: Brief Narrative: 68 year old with past medical history significant for alcohol abuse, hepatitis C, cirrhosis, hepatocellular carcinoma, ascites requiring recurrent paracentesis, esophageal varices with bleed, recent admission last month for MRSA SBP.  Presented with generalized diffuse abdominal pain he had paracentesis 2 days prior to admission.  He presented with a temperature of 101.5 mild leukocytosis.   Patient was treated with IV antibiotics. His liver function continue to get worse. He also develops AKI on CKD stage III, suspect hepatorenal syndrome. Abdominal drain was not place due poor prognosis and elevated INR.   After discussion with patient and POA, plan is for comfort care and transfer to residential hospice.    Assessment & Plan:   Principal Problem:   SBP (spontaneous bacterial peritonitis) ([place]) Active Problems:   Chronic hepatitis C without hepatic coma (HCC)   Alcoholic cirrhosis of liver (HCC)   Hepatocellular carcinoma (HCC)   Esophageal varices in alcoholic cirrhosis (HCC)   HTN (hypertension)   Protein-calorie malnutrition, severe   AKI (acute kidney injury) ([place])   Bacteremia due to Escherichia coli   FTT (failure to thrive) in adult  Sepsis/SBP/E. coli bacteremia; Presented with fever leukocytosis lactic acidosis. He was treated  IV ceftriaxone and Flagyl. Will discontinue at discharge.  Will stop Bactrim due to AKI.   started  Doxycycline, for SBP prophylaxis.   AKI on chronic kidney disease stage  III Concern for hepatorenal syndrome, also hypovolemia dehydration. Creatinine has increased to 3 today. Didn't improved on IV fluids. Worsening renal failure in setting of hepatorenal syndrome.  poor prognosis.   Hyponatremia in the setting of cirrhosis with ascites. Monitor.  Anemia of chronic disease and intermittent GI loss. Continue to monitor hemoglobin.  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 Monitor.  Coagulopathy INR increased to 3.  INR continue to be elevated beside vitamin K   Hepatocellular carcinoma/chronic hepatitis C, alcoholic cirrhosis, esophageal varices, recurrent ascites.  hyperbilirubinemia Patient has received multiple paracentesis more than 8 times. IR was consulted for palliative drain placement. Delay in placement due to increase INR. -Liver function test continue to increase, bilirubin increased to 7.  [place] negative for obstruction.  Palliative to follow for further discussion with patient and his POA. discussed with patient and POA, patient has develop hepatorenal syndrome, worsening liver failure and renal failure. Prognosis less than 2 weeks. They want to proceed with comfort care. Plan for residential hospice.   Severe malnutrition due to chronic disease/underweight  Family reports ongoing weight loss On ensure.   FTT; Patient met with palliative he did not want to go to residential hospice.  Was made DNR. Estimated body mass index is 19.89 kg/m as calculated from the following:   Height as of this encounter: 5' 6"  (1.676 m).   Weight as of this encounter: 55.9 kg.    Discharge Diagnoses:  Principal Problem:   SBP (spontaneous bacterial peritonitis) ([place]) Active Problems:   Chronic hepatitis C without hepatic coma (HCC)   Alcoholic cirrhosis of liver (HCC)   Hepatocellular carcinoma (HCC)   Esophageal varices in alcoholic cirrhosis (HCC)   HTN (hypertension)   Protein-calorie malnutrition,  severe   AKI (acute  kidney injury) ([place])   Bacteremia due to Escherichia coli   FTT (failure to thrive) in adult    Discharge Instructions  Discharge Instructions    Diet general   Complete by:  As directed    Increase activity slowly   Complete by:  As directed      Allergies as of 11/02/2018      Reactions   Aspirin Other (See Comments)   Does not take because of Hep C       Medication List    STOP taking these medications   Ferrous Sulfate 27 MG Tabs   NEXAVAR 200 MG tablet Generic drug:  SORAfenib   polyethylene glycol packet Commonly known as:  MIRALAX / GLYCOLAX   spironolactone 50 MG tablet Commonly known as:  ALDACTONE   sulfamethoxazole-trimethoprim 800-160 MG tablet Commonly known as:  BACTRIM DS,SEPTRA DS     TAKE these medications   doxycycline 100 MG tablet Commonly known as:  VIBRA-TABS Take 1 tablet (100 mg total) by mouth every 12 (twelve) hours.   nadolol 20 MG tablet Commonly known as:  CORGARD Take 1 tablet (20 mg total) by mouth daily.   ondansetron 8 MG tablet Commonly known as:  ZOFRAN Take 1 tablet (8 mg total) by mouth 2 (two) times daily as needed for nausea or vomiting.   oxyCODONE 5 MG immediate release tablet Commonly known as:  Oxy IR/ROXICODONE Take 1 tablet (5 mg total) by mouth every 6 (six) hours as needed for severe pain. What changed:  when to take this   pantoprazole 40 MG tablet Commonly known as:  PROTONIX Take 1 tablet (40 mg total) by mouth 2 (two) times daily for 14 days, THEN 1 tablet (40 mg total) daily. Start taking on:  September 07, 2018 What changed:    See the new instructions.  Another medication with the same name was removed. Continue taking this medication, and follow the directions you see here.   sucralfate 1 GM/10ML suspension Commonly known as:  CARAFATE Take 10 mLs (1 g total) by mouth 4 (four) times daily -  with meals and at bedtime.       Allergies  Allergen Reactions  . Aspirin Other (See Comments)     Does not take because of Hep C     Consultations: Palliative  Procedures/Studies: Dg Chest 2 View  Result Date: 10/27/2018 CLINICAL DATA:  Tachycardia with chills. EXAM: CHEST - 2 VIEW COMPARISON:  09/30/2018 FINDINGS: Normal heart size and mediastinal contours. Lungs are clear. No overt pulmonary edema, effusion or pneumothorax. No acute osseous abnormality. IMPRESSION: No active cardiopulmonary disease. Electronically Signed   By: Ashley Royalty M.D.   On: 10/27/2018 18:09   US Abdomen Complete  Result Date: 11/01/2018 CLINICAL DATA:  Bilirubinemia. EXAM: ABDOMEN ULTRASOUND COMPLETE COMPARISON:  CT scan of October 27, 2018. FINDINGS: Gallbladder: No gallstones are noted. Gallbladder sludge is noted. No sonographic Murphy's sign is noted. Mild gallbladder wall thickening is noted most likely due to surrounding ascites. Common bile duct: Diameter: 5 mm which is within normal limits. Liver: Nodular hepatic contours are noted consistent with hepatic cirrhosis. Heterogeneous echotexture is noted as well. Multiple hepatic lesions are noted with the largest measuring 3.6 cm most consistent with metastatic disease. Portal vein is patent on color Doppler imaging with reversal of blood flow away from the liver consistent with portal hypertension. IVC: No abnormality visualized. Pancreas: Visualized portion unremarkable. Spleen: Size and appearance within normal limits.  Right Kidney: Length: 9.2 cm. Echogenicity within normal limits. No mass or hydronephrosis visualized. Left Kidney: Length: 9.8 cm. 1.3 cm simple cyst is seen in lower pole. Echogenicity within normal limits. No mass or hydronephrosis visualized. Abdominal aorta: No aneurysm visualized. Other findings: Moderate ascites is noted. IMPRESSION: Findings consistent with hepatic cirrhosis. Multiple hepatic masses are noted consistent with metastatic disease. Reversal of normal flow within portal vein is noted consistent with portal hypertension. Moderate  ascites. Gallbladder sludge is noted. Electronically Signed   By: Marijo Conception, M.D.   On: 11/01/2018 21:38   Ct Abdomen Pelvis W Contrast  Result Date: 10/27/2018 CLINICAL DATA:  Abdominal pain and distention. History of liver disease, post multiple paracenteses. Paracentesis yesterday with worsening abdominal pain today. Suspicion for injury related paracentesis. History of hepatocellular carcinoma, post ablation. EXAM: CT ABDOMEN AND PELVIS WITH CONTRAST TECHNIQUE: Multidetector CT imaging of the abdomen and pelvis was performed using the standard protocol following bolus administration of intravenous contrast. CONTRAST:  144m ISOVUE-300 IOPAMIDOL (ISOVUE-300) INJECTION 61% COMPARISON:  Ultrasound paracentesis 10/25/2018. MRI abdomen 02/28/2017, MRI abdomen 08/26/2018 FINDINGS: Lower chest: Emphysematous changes and fibrosis in the lung bases. Small esophageal hiatal hernia. Hepatobiliary: Changes of hepatic cirrhosis with nodular liver contour and prominence of the lateral segment left and caudate lobes of the liver. Overall, the liver is small in size. Multiple lesions are demonstrated throughout the liver, some hypoechoic, some heterogeneous and predominantly isoechoic, and some with surrounding enhancing capsules. The largest lesions are in segment 4, measuring 3.8 cm diameter and in segment 5, measuring 4.3 cm in diameter. These lesions likely represent malignant lesions, with the lesion appearance most consistent with multifocal hepatocellular carcinoma. Appearance of the lesions is similar to previous MRI from 08/26/2018. There is a circumscribed cystic appearing lesion centrally at the dome of the liver which corresponds to a previous ablation center as seen on previous MRI. No evidence of parenchymal hemorrhage. Gallbladder is contracted. No stones are identified. No bile duct dilatation. Pancreas: Unremarkable. No pancreatic ductal dilatation or surrounding inflammatory changes. Spleen: Normal  in size without focal abnormality. Adrenals/Urinary Tract: Adrenal glands are unremarkable. Kidneys are normal, without renal calculi, focal lesion, or hydronephrosis. Bladder is unremarkable. Stomach/Bowel: Stomach, small bowel, and colon are not abnormally distended. Mild diffuse small bowel wall thickening and hyperemia likely related to ascites. Early ischemic change would be a less likely possibility. Colon is decompressed and stool-filled, limiting evaluation of the colonic wall but there appears to be some colonic wall thickening. This may be related to pulmonary hypertensive colopathy. Appendix is not definitively identified. Vascular/Lymphatic: Aortic atherosclerosis. No enlarged abdominal or pelvic lymph nodes. Multiple upper abdominal varices identified with varices in the omentum and mesentery. 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 In addition, there is new focal asymmetrical filling defect in the proximal superior mesenteric vein likely represents focal nonocclusive mesenteric vein thrombosis. Reproductive: Prostate is unremarkable. Other: There is prominent diffuse abdominal and pelvic ascites. No free air is identified. No definite omental nodularity. Musculoskeletal: Somewhat poorly defined lucent bone lesions are demonstrated in the L1 vertebra and in the left ischium, suspicious for metastatic disease. 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 IMPRESSION: 1. Changes of hepatic cirrhosis with portal venous hypertension, multiple upper abdominal varices, mesenteric varices, and diffuse abdominal and pelvic ascites. 2. Multiple lesions throughout the liver likely representing malignant lesions, probably multifocal hepatocellular carcinoma. 3. Portal venous thrombus again demonstrated, similar to previous MRI. There is progression with new nonocclusive thrombus  in the superior mesenteric vein today. 4. Diffuse small bowel wall thickening and hyperemia may be related to  ascites. Early ischemic change could also have this appearance. 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 5. Indeterminate bone lesions in the L1 vertebra and left ischium suspicious for metastatic disease. 6. Emphysematous changes and fibrosis in the lung bases. Aortic Atherosclerosis (ICD10-I70.0) and Emphysema (ICD10-J43.9). These results were called by telephone at the time of interpretation on 10/27/2018 at 7:36 pm to Dr. Marda Stalker , who verbally acknowledged these results. Electronically Signed   By: Lucienne Capers M.D.   On: 10/27/2018 19:56   US Paracentesis  Result Date: 10/28/2018 INDICATION: Hepatocellular carcinoma. Cirrhosis. Recurrent ascites. Request for therapeutic paracentesis. EXAM: ULTRASOUND GUIDED PARACENTESIS MEDICATIONS: 1% lidocaine 7 mL COMPLICATIONS: None immediate. PROCEDURE: Informed written consent was obtained from the patient after a discussion of the risks, benefits and alternatives to treatment. A timeout was performed prior to the initiation of the procedure. Initial ultrasound scanning demonstrates a small amount of ascites within the left lower abdominal quadrant. The left lower abdomen was prepped and draped in the usual sterile fashion. 1% lidocaine with epinephrine was used for local anesthesia. Following this, a 19 gauge, 7-cm, Yueh catheter was introduced. An ultrasound image was saved for documentation purposes. The paracentesis was performed. The catheter was removed and a dressing was applied. The patient tolerated the procedure well without immediate post procedural complication. FINDINGS: A total of approximately 2 L of clear yellow fluid was removed. Samples were sent to the laboratory as requested by the clinical team. IMPRESSION: Successful ultrasound-guided paracentesis yielding 2 liters of peritoneal fluid. Read by: Gareth Eagle, PA-C Electronically Signed   By: Jacqulynn Cadet M.D.   On: 10/28/2018 14:26   US  Paracentesis  Result Date: 10/25/2018 INDICATION: Patient with history of hepatitis-C, hepatocellular carcinoma, cirrhosis, recurrent ascites. Request made for therapeutic paracentesis. EXAM: ULTRASOUND GUIDED THERAPEUTIC PARACENTESIS MEDICATIONS: None COMPLICATIONS: None immediate. PROCEDURE: Informed written consent was obtained from the patient after a discussion of the risks, benefits and alternatives to treatment. A timeout was performed prior to the initiation of the procedure. Initial ultrasound scanning demonstrates a moderate to large amount of ascites within the left mid to lower abdominal quadrant. The left mid to lower abdomen was prepped and draped in the usual sterile fashion. 1% lidocaine was used for local anesthesia. Following this, a 19 gauge, 7-cm, Yueh catheter was introduced. An ultrasound image was saved for documentation purposes. The paracentesis was performed. The catheter was removed and a dressing was applied. The patient tolerated the procedure well without immediate post procedural complication. FINDINGS: A total of approximately 4.3 liters of yellow fluid was removed. IMPRESSION: Successful ultrasound-guided therapeutic paracentesis yielding 4.3 liters of peritoneal fluid. Read by: Rowe Robert, PA-C Electronically Signed   By: Jerilynn Mages.  Shick M.D.   On: 10/25/2018 12:21   US Paracentesis  Result Date: 10/09/2018 INDICATION: Patient with history of hepatitis C, hepatocellular carcinoma and cirrhosis with recurrent ascites requiring repeated large volume paracenteses. Request for therapeutic paracentesis today. EXAM: ULTRASOUND GUIDED THERAPEUTIC PARACENTESIS MEDICATIONS: 10 ml 1% lidocaine COMPLICATIONS: None immediate. PROCEDURE: Informed written consent was obtained from the patient after a discussion of the risks, benefits and alternatives to treatment. A timeout was performed prior to the initiation of the procedure. Initial ultrasound scanning demonstrates a large amount of ascites  within the left lower abdominal quadrant. The left lower abdomen was prepped and draped in the usual sterile fashion. 1% lidocaine was  used for local anesthesia. Following this, a 19 gauge, 7-cm, Yueh catheter was introduced. An ultrasound image was saved for documentation purposes. The paracentesis was performed. The catheter was removed and a dressing was applied. The patient tolerated the procedure well without immediate post procedural complication. FINDINGS: A total of approximately 5.8 L of clear yellow fluid was removed. IMPRESSION: Successful ultrasound-guided paracentesis yielding 5.8 liters of peritoneal fluid. Read by Candiss Norse, PA-C Electronically Signed   By: Jerilynn Mages.  Shick M.D.   On: 10/09/2018 12:10     Subjective: Alert, denies worsening abdominal pain.    Discharge Exam: Vitals:   11/01/18 2100 11/02/18 0552  BP:  94/65  Pulse:  78  Resp: 13   Temp:  98.3 F (36.8 C)  SpO2:  98%   Vitals:   11/01/18 1429 11/01/18 2058 11/01/18 2100 11/02/18 0552  BP: 95/69 91/63  94/65  Pulse: 70 74  78  Resp: 16  13   Temp:  98.7 F (37.1 C)  98.3 F (36.8 C)  TempSrc:      SpO2: 100% 98%  98%  Weight:      Height:        General: Cachetic Cardiovascular: S 1, S 2  Respiratory: Bilateral ronchus Abdominal: soft, mild distended.  Extremities: no edema, no cyanosis    The results of significant diagnostics from this hospitalization (including imaging, microbiology, ancillary and laboratory) are listed below for reference.     Microbiology: Recent Results (from the past 240 hour(s))  Urine culture     Status: Abnormal   Collection Time: 10/27/18  5:45 PM  Result Value Ref Range Status   Specimen Description URINE, RANDOM  Final   Special Requests   Final    NONE Performed at [facility] [address], [place], [place] [address]    Culture <10,000 COLONIES/mL INSIGNIFICANT GROWTH (A)  Final   Report Status 10/29/2018 FINAL  Final  Blood  Culture (routine x 2)     Status: Abnormal   Collection Time: 10/27/18  5:58 PM  Result Value Ref Range Status   Specimen Description   Final    BLOOD BLOOD LEFT ARM Performed at [facility] [address], [place], [place] [address]    Special Requests   Final    BOTTLES DRAWN AEROBIC AND ANAEROBIC Blood Culture adequate volume Performed at [facility] [address], [place], [place] [address]    Culture  Setup Time   Final    GRAM NEGATIVE RODS IN BOTH AEROBIC AND ANAEROBIC BOTTLES CRITICAL RESULT CALLED TO, READ BACK BY AND VERIFIED WITH: PHARMD TERRY GREEN 0913 Performed at [facility], [place] [address], [place], [place] [address]    Culture ESCHERICHIA COLI (A)  Final   Report Status 10/30/2018 FINAL  Final   Organism ID, Bacteria ESCHERICHIA COLI  Final      Susceptibility   Escherichia coli - MIC*    AMPICILLIN >=32 RESISTANT Resistant     CEFAZOLIN <=4 SENSITIVE Sensitive     CEFEPIME <=1 SENSITIVE Sensitive     CEFTAZIDIME <=1 SENSITIVE Sensitive     CEFTRIAXONE <=1 SENSITIVE Sensitive     CIPROFLOXACIN <=0.25 SENSITIVE Sensitive     GENTAMICIN <=1 SENSITIVE Sensitive     IMIPENEM <=0.25 SENSITIVE Sensitive     TRIMETH/SULFA >=320 RESISTANT Resistant     AMPICILLIN/SULBACTAM 16 INTERMEDIATE Intermediate     PIP/TAZO <=4 SENSITIVE Sensitive     Extended ESBL NEGATIVE  Sensitive     * ESCHERICHIA COLI  Blood Culture ID Panel (Reflexed)     Status: Abnormal   Collection Time: 10/27/18  5:58 PM  Result Value Ref Range Status   Enterococcus species NOT DETECTED NOT DETECTED Final   Listeria monocytogenes NOT DETECTED NOT DETECTED Final   Staphylococcus species NOT DETECTED NOT DETECTED Final   Staphylococcus aureus (BCID) NOT DETECTED NOT DETECTED Final   Streptococcus species NOT DETECTED NOT DETECTED Final   Streptococcus agalactiae NOT DETECTED NOT DETECTED Final   Streptococcus pneumoniae NOT DETECTED NOT DETECTED  Final   Streptococcus pyogenes NOT DETECTED NOT DETECTED Final   Acinetobacter baumannii NOT DETECTED NOT DETECTED Final   Enterobacteriaceae species DETECTED (A) NOT DETECTED Final    Comment: Enterobacteriaceae represent a large family of gram-negative bacteria, not a single organism. CRITICAL RESULT CALLED TO, READ BACK BY AND VERIFIED WITH: PHARMD TERRY GREEN [id number] [facility]    Enterobacter cloacae complex NOT DETECTED NOT DETECTED Final   Escherichia coli DETECTED (A) NOT DETECTED Final    Comment: CRITICAL RESULT CALLED TO, READ BACK BY AND VERIFIED WITH: PHARMD TERRY GREEN [id number] [facility]    Klebsiella oxytoca NOT DETECTED NOT DETECTED Final   Klebsiella pneumoniae NOT DETECTED NOT DETECTED Final   Proteus species NOT DETECTED NOT DETECTED Final   Serratia marcescens NOT DETECTED NOT DETECTED Final   Carbapenem resistance NOT DETECTED NOT DETECTED Final   Haemophilus influenzae NOT DETECTED NOT DETECTED Final   Neisseria meningitidis NOT DETECTED NOT DETECTED Final   Pseudomonas aeruginosa NOT DETECTED NOT DETECTED Final   Candida albicans NOT DETECTED NOT DETECTED Final   Candida glabrata NOT DETECTED NOT DETECTED Final   Candida krusei NOT DETECTED NOT DETECTED Final   Candida parapsilosis NOT DETECTED NOT DETECTED Final   Candida tropicalis NOT DETECTED NOT DETECTED Final  Blood Culture (routine x 2)     Status: None   Collection Time: 10/27/18  9:14 PM  Result Value Ref Range Status   Specimen Description   Final    BLOOD RIGHT ANTECUBITAL Performed at [facility], [place] [address] [place], [place] [address]    Special Requests   Final    BOTTLES DRAWN AEROBIC ONLY Blood Culture adequate volume Performed at [facility] [address] [place], [place] [address]    Culture   Final    NO GROWTH 5 DAYS Performed at [facility], [place] [address], [place], [place] [address]    Report Status 11/02/2018 FINAL  Final  Culture,  body fluid-bottle     Status: None   Collection Time: 10/28/18  6:55 PM  Result Value Ref Range Status   Specimen Description PERITONEAL  Final   Special Requests NONE  Final   Culture   Final    NO GROWTH 5 DAYS Performed at [facility] [address], [place], [place] [address]    Report Status 11/02/2018 FINAL  Final  Gram stain     Status: None   Collection Time: 10/28/18  6:55 PM  Result Value Ref Range Status   Specimen Description PERITONEAL  Final   Special Requests NONE  Final   Gram Stain   Final    FEW WBC PRESENT, PREDOMINANTLY PMN NO ORGANISMS SEEN Performed at [facility], [place] [address], [place], [place] [address]    Report Status 10/28/2018 FINAL  Final  Culture, blood (routine x 2)     Status: None (Preliminary  result)   Collection Time: 10/30/18 12:32 PM  Result Value Ref Range Status   Specimen Description   Final    BLOOD RIGHT ARM Performed at [facility] [address], [place], [place] [address]    Special Requests   Final    BOTTLES DRAWN AEROBIC ONLY Blood Culture results may not be optimal due to an inadequate volume of blood received in culture bottles   Culture   Final    NO GROWTH 3 DAYS Performed at [facility], [place] [address] [place], [place] [address]    Report Status PENDING  Incomplete  Culture, blood (routine x 2)     Status: None (Preliminary result)   Collection Time: 10/30/18 12:41 PM  Result Value Ref Range Status   Specimen Description   Final    BLOOD RIGHT ARM Performed at [facility] [address], [place], [place] [address]    Special Requests   Final    BOTTLES DRAWN AEROBIC ONLY Blood Culture adequate volume Performed at [facility] [address], [place], [place] [address]    Culture   Final    NO GROWTH 3 DAYS Performed at [facility], [place] [address] [place], [place] [address]    Report Status PENDING  Incomplete      Labs: BNP (last 3 results) Recent Labs    09/12/18 1029  BNP 626.9*   Basic Metabolic Panel: Recent Labs  Lab 10/29/18 0329 10/30/18 0403 10/31/18 0500 11/01/18 0329 11/02/18 0344  NA 130* 132* 130* 131* 129*  K 4.2 4.0 4.3 4.6 5.2*  CL 108 108 107 104 106  CO2 17* 17* 14* 11* 13*  GLUCOSE 100* 99 72 104* 86  BUN 29* 30* 42* 64* 79*  CREATININE 1.06 1.17 1.95* 3.11* 3.21*  CALCIUM 7.3* 7.4* 8.0* 8.0* 7.3*  MG 1.6* 2.1  --   --   --    Liver Function Tests: Recent Labs  Lab 10/27/18 1745 10/28/18 0417 11/01/18 0329 11/02/18 0344  AST 135* 91* 55* 96*  ALT 60* 44 26 33  ALKPHOS 207* 140* 25* 104  BILITOT 5.8* 4.1* 7.8* 5.9*  PROT 8.7* 6.3* 6.5 6.1*  ALBUMIN 2.1* 1.5* 1.5* 1.5*   Recent Labs  Lab 10/27/18 1745  LIPASE 38   Recent Labs  Lab 10/27/18 1745  AMMONIA 39*   CBC: Recent Labs  Lab 10/27/18 1745  10/29/18 0329 10/30/18 0403 10/31/18 0500 11/01/18 0329 11/02/18 0344  WBC 11.6*   < > 11.5* 10.0 11.9* 15.0* 12.7*  NEUTROABS 10.5*  --  8.5* 6.8 9.0* 11.4*  --   HGB 12.5*   < > 8.2* 9.4* 10.3* 9.1* 9.5*  HCT 38.1*   < > 24.7* 29.0* 32.0* 27.8* 29.2*  MCV 92.7   < > 91.5 95.1 96.4 92.4 93.9  PLT 151   < > 87* 109* 144* 131* 153   < > = values in this interval not displayed.   Cardiac Enzymes: No results for input(s): CKTOTAL, CKMB, CKMBINDEX, TROPONINI in the last 168 hours. BNP: Invalid input(s): POCBNP CBG: No results for input(s): GLUCAP in the last 168 hours. D-Dimer No results for input(s): DDIMER in the last 72 hours. Hgb A1c No results for input(s): HGBA1C in the last 72 hours. Lipid Profile No results for input(s): CHOL, HDL, LDLCALC, TRIG, CHOLHDL, LDLDIRECT in the last 72 hours. Thyroid function studies No results for input(s): TSH, T4TOTAL, T3FREE, THYROIDAB in the last  72 hours.  Invalid input(s): FREET3 Anemia work up No results for input(s): VITAMINB12, FOLATE, FERRITIN, TIBC, IRON, RETICCTPCT in the last 72  hours. Urinalysis    Component Value Date/Time   COLORURINE YELLOW 10/27/2018 1745   APPEARANCEUR CLEAR 10/27/2018 1745   LABSPEC 1.043 (H) 10/27/2018 1745   PHURINE 5.0 10/27/2018 1745   GLUCOSEU NEGATIVE 10/27/2018 1745   HGBUR SMALL (A) 10/27/2018 1745   BILIRUBINUR NEGATIVE 10/27/2018 1745   KETONESUR NEGATIVE 10/27/2018 1745   PROTEINUR NEGATIVE 10/27/2018 1745   UROBILINOGEN >=8.0 05/04/2017 0914   NITRITE NEGATIVE 10/27/2018 1745   LEUKOCYTESUR NEGATIVE 10/27/2018 1745   Sepsis Labs Invalid input(s): PROCALCITONIN,  WBC,  LACTICIDVEN Microbiology Recent Results (from the past 240 hour(s))  Urine culture     Status: Abnormal   Collection Time: 10/27/18  5:45 PM  Result Value Ref Range Status   Specimen Description URINE, RANDOM  Final   Special Requests   Final    NONE Performed at [facility], [place] [address], [place], [place] [address]    Culture <10,000 COLONIES/mL INSIGNIFICANT GROWTH (A)  Final   Report Status 10/29/2018 FINAL  Final  Blood Culture (routine x 2)     Status: Abnormal   Collection Time: 10/27/18  5:58 PM  Result Value Ref Range Status   Specimen Description   Final    BLOOD BLOOD LEFT ARM Performed at [facility] [address] [place], [place] [address]    Special Requests   Final    BOTTLES DRAWN AEROBIC AND ANAEROBIC Blood Culture adequate volume Performed at [facility] [address], [place], [place] [address]    Culture  Setup Time   Final    GRAM NEGATIVE RODS IN BOTH AEROBIC AND ANAEROBIC BOTTLES CRITICAL RESULT CALLED TO, READ BACK BY AND VERIFIED WITH: PHARMD TERRY GREEN 0913 Performed at [facility], [place] [address] [place], [place] [address]    Culture ESCHERICHIA COLI (A)  Final   Report Status 10/30/2018 FINAL  Final   Organism ID, Bacteria ESCHERICHIA COLI  Final      Susceptibility   Escherichia coli - MIC*    AMPICILLIN >=32 RESISTANT Resistant      CEFAZOLIN <=4 SENSITIVE Sensitive     CEFEPIME <=1 SENSITIVE Sensitive     CEFTAZIDIME <=1 SENSITIVE Sensitive     CEFTRIAXONE <=1 SENSITIVE Sensitive     CIPROFLOXACIN <=0.25 SENSITIVE Sensitive     GENTAMICIN <=1 SENSITIVE Sensitive     IMIPENEM <=0.25 SENSITIVE Sensitive     TRIMETH/SULFA >=320 RESISTANT Resistant     AMPICILLIN/SULBACTAM 16 INTERMEDIATE Intermediate     PIP/TAZO <=4 SENSITIVE Sensitive     Extended ESBL NEGATIVE Sensitive     * ESCHERICHIA COLI  Blood Culture ID Panel (Reflexed)     Status: Abnormal   Collection Time: 10/27/18  5:58 PM  Result Value Ref Range Status   Enterococcus species NOT DETECTED NOT DETECTED Final   Listeria monocytogenes NOT DETECTED NOT DETECTED Final   Staphylococcus species NOT DETECTED NOT DETECTED Final   Staphylococcus aureus (BCID) NOT DETECTED NOT DETECTED Final   Streptococcus species NOT DETECTED NOT DETECTED Final   Streptococcus agalactiae NOT DETECTED NOT DETECTED Final   Streptococcus pneumoniae NOT DETECTED NOT DETECTED Final   Streptococcus pyogenes NOT DETECTED NOT DETECTED Final   Acinetobacter baumannii NOT DETECTED NOT DETECTED Final   Enterobacteriaceae species DETECTED (A) NOT DETECTED Final    Comment: Enterobacteriaceae represent a large  family of gram-negative bacteria, not a single organism. CRITICAL RESULT CALLED TO, READ BACK BY AND VERIFIED WITH: PHARMD TERRY GREEN [id number] [facility]    Enterobacter cloacae complex NOT DETECTED NOT DETECTED Final   Escherichia coli DETECTED (A) NOT DETECTED Final    Comment: CRITICAL RESULT CALLED TO, READ BACK BY AND VERIFIED WITH: PHARMD TERRY GREEN 0913 [id number] [facility]    Klebsiella oxytoca NOT DETECTED NOT DETECTED Final   Klebsiella pneumoniae NOT DETECTED NOT DETECTED Final   Proteus species NOT DETECTED NOT DETECTED Final   Serratia marcescens NOT DETECTED NOT DETECTED Final   Carbapenem resistance NOT DETECTED NOT DETECTED Final   Haemophilus influenzae NOT DETECTED NOT  DETECTED Final   Neisseria meningitidis NOT DETECTED NOT DETECTED Final   Pseudomonas aeruginosa NOT DETECTED NOT DETECTED Final   Candida albicans NOT DETECTED NOT DETECTED Final   Candida glabrata NOT DETECTED NOT DETECTED Final   Candida krusei NOT DETECTED NOT DETECTED Final   Candida parapsilosis NOT DETECTED NOT DETECTED Final   Candida tropicalis NOT DETECTED NOT DETECTED Final  Blood Culture (routine x 2)     Status: None   Collection Time: 10/27/18  9:14 PM  Result Value Ref Range Status   Specimen Description   Final    BLOOD RIGHT ANTECUBITAL Performed at [facility], [place] [address] [place], [place] [address]    Special Requests   Final    BOTTLES DRAWN AEROBIC ONLY Blood Culture adequate volume Performed at [facility] [address] [place], [place] [address]    Culture   Final    NO GROWTH 5 DAYS Performed at [facility], [place] [address] [place], [place] [address]    Report Status 11/02/2018 FINAL  Final  Culture, body fluid-bottle     Status: None   Collection Time: 10/28/18  6:55 PM  Result Value Ref Range Status   Specimen Description PERITONEAL  Final   Special Requests NONE  Final   Culture   Final    NO GROWTH 5 DAYS Performed at [facility] [address], [place], [place] [address]    Report Status 11/02/2018 FINAL  Final  Gram stain     Status: None   Collection Time: 10/28/18  6:55 PM  Result Value Ref Range Status   Specimen Description PERITONEAL  Final   Special Requests NONE  Final   Gram Stain   Final    FEW WBC PRESENT, PREDOMINANTLY PMN NO ORGANISMS SEEN Performed at [facility], [place] [address], [place], [place] [address]    Report Status 10/28/2018 FINAL  Final  Culture, blood (routine x 2)     Status: None (Preliminary result)   Collection Time: 10/30/18 12:32 PM  Result Value Ref Range Status   Specimen Description   Final    BLOOD RIGHT ARM Performed at [facility] [address] [place], [place] [address]    Special Requests   Final    BOTTLES DRAWN AEROBIC ONLY Blood Culture results may not be optimal due to an inadequate volume of blood received in culture bottles   Culture   Final    NO GROWTH 3 DAYS Performed at [facility], [place] [address] [place],  [address]    Report Status PENDING  Incomplete  Culture, blood (routine x 2)     Status: None (Preliminary result)   Collection Time: 10/30/18 12:41 PM  Result Value Ref Range  Status   Specimen Description   Final    BLOOD RIGHT ARM Performed at [facility] [address] [place], [place] [address]    Special Requests   Final    BOTTLES DRAWN AEROBIC ONLY Blood Culture adequate volume Performed at [facility] [address] [place], [place] [address]    Culture   Final    NO GROWTH 3 DAYS Performed at [facility], [place] [address] [place], [place] [address]    Report Status PENDING  Incomplete     Time coordinating discharge: 35 Minutes.   SIGNED:   Elmarie Shiley, MD  [facility] 11/02/2018, 12:40 PM Pager   If 7PM-7AM, please contact night-coverage [email or web address] Password [email or web address]

## 2018-11-02 NOTE — Progress Notes (Addendum)
CSW referred patient to [facility] for residential placement at [facility]. Anderson [place] will follow up with the patient ex-wife/friend. CSW will continue to assist with patient discharge.   Patient accepted to [facility]. Admission paperwork completed. CSW informed physician. [facility] will transport when [facility] is ready to accept the patient.   [facility] called. D/C Summary faxed.   Kathrin Greathouse, Marlinda Mike, MSW Clinical Social Worker  [phone] 11/02/2018  11:31 AM

## 2018-11-03 ENCOUNTER — Inpatient Hospital Stay: Payer: Medicare HMO | Attending: Oncology | Admitting: Nurse Practitioner

## 2018-11-03 ENCOUNTER — Inpatient Hospital Stay: Payer: Medicare HMO

## 2018-11-04 LAB — CULTURE, BLOOD (ROUTINE X 2)
Culture: NO GROWTH
Culture: NO GROWTH
SPECIAL REQUESTS: ADEQUATE

## 2018-11-18 DEATH — deceased

## 2019-09-11 IMAGING — CR DG CHEST 2V
2 series · 2 of 2 positions shown · non-contrast
Comparison: Chest x-ray of August 07, 2018

CLINICAL DATA: Chest pain and peripheral edema since endoscopy last
week. History of liver malignancy and recent GI bleed. No cough,
shortness of breath, or chest congestion. Current smoker.

EXAM:
CHEST - 2 VIEW

[w chest pa]
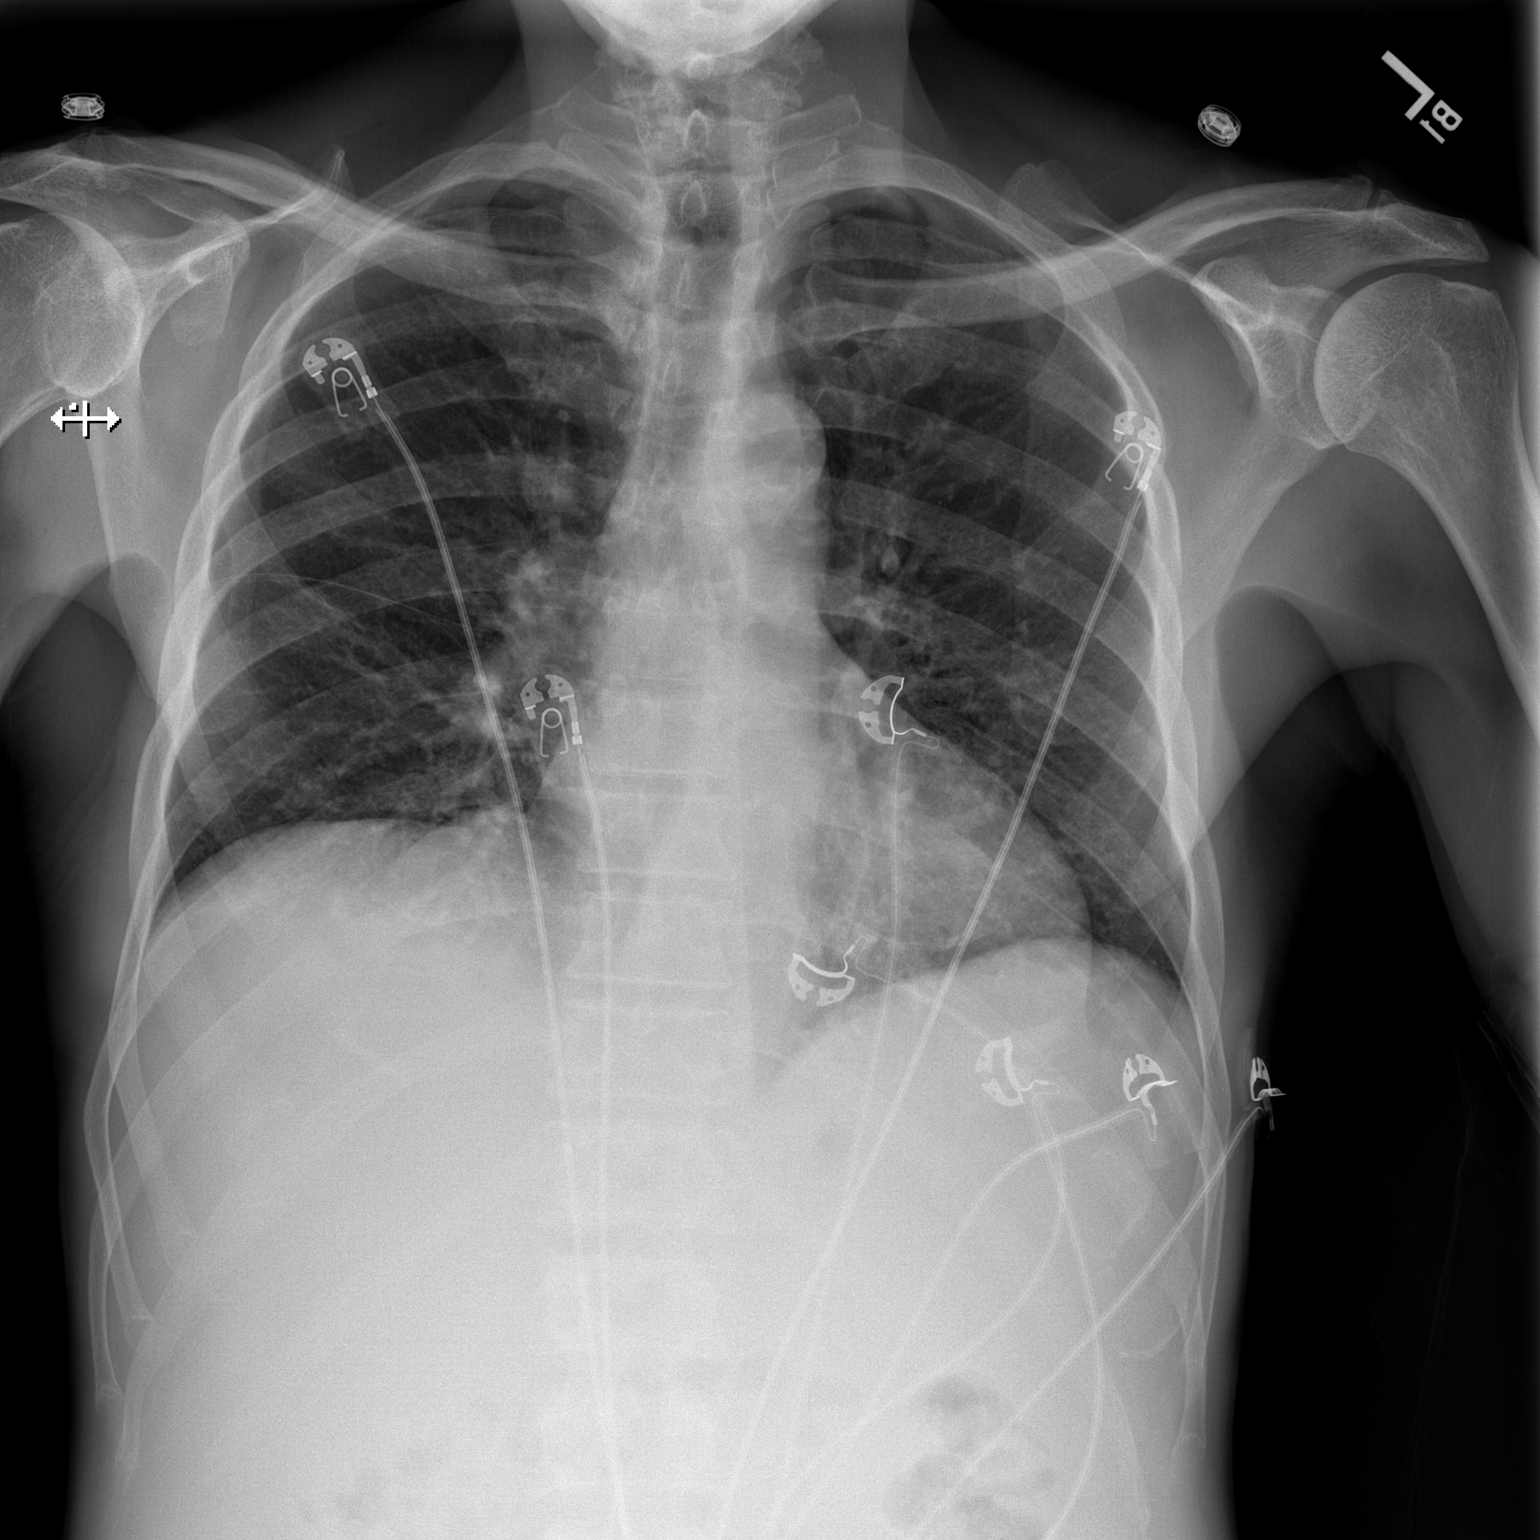

[w chest lat]
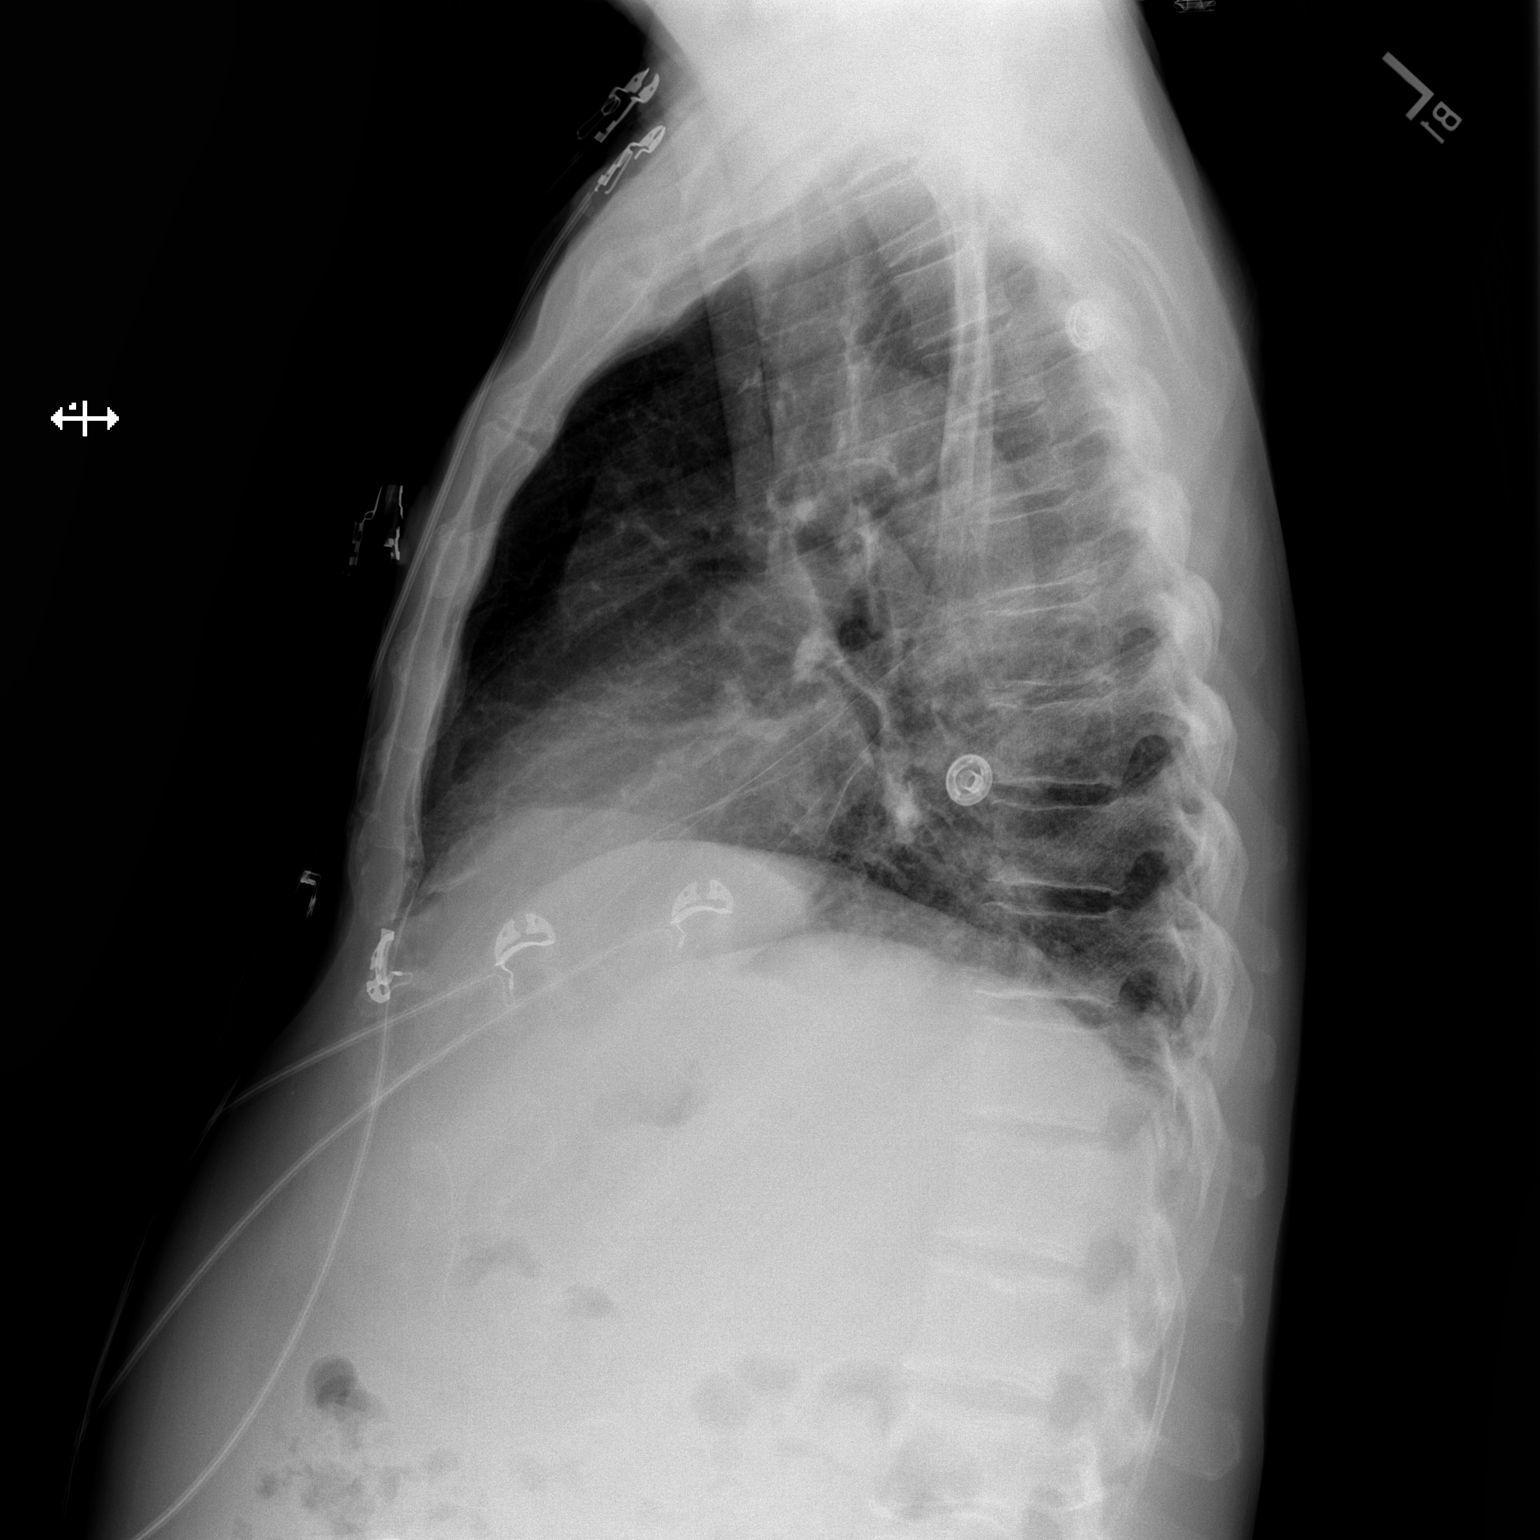

[2 of 2 positions shown; findings below may reference images not displayed]

FINDINGS: The lungs are adequately inflated. The interstitial markings are
mildly increased. The heart is top-normal in size. The pulmonary
vascularity is not clearly engorged. There is calcification in the
wall of the aortic arch. The bony thorax exhibits no acute
abnormality.
IMPRESSION: Coarse lung markings at both bases suggests subsegmental
atelectasis. No definite pneumonia nor pulmonary edema.

Thoracic aortic atherosclerosis.

## 2019-09-19 IMAGING — US US PARACENTESIS
1 series · 5 of 5 positions shown · non-contrast
Comparison: none

INDICATION: Patient with history of hepatocellular carcinoma, esophageal varices
with prior banding, cirrhosis, hepatitis-C, recurrent ascites.
Request made for therapeutic paracentesis up to 5 liters.

[Series 1: us paracentesis · 5 of 5 slices shown]
[im 1/5]
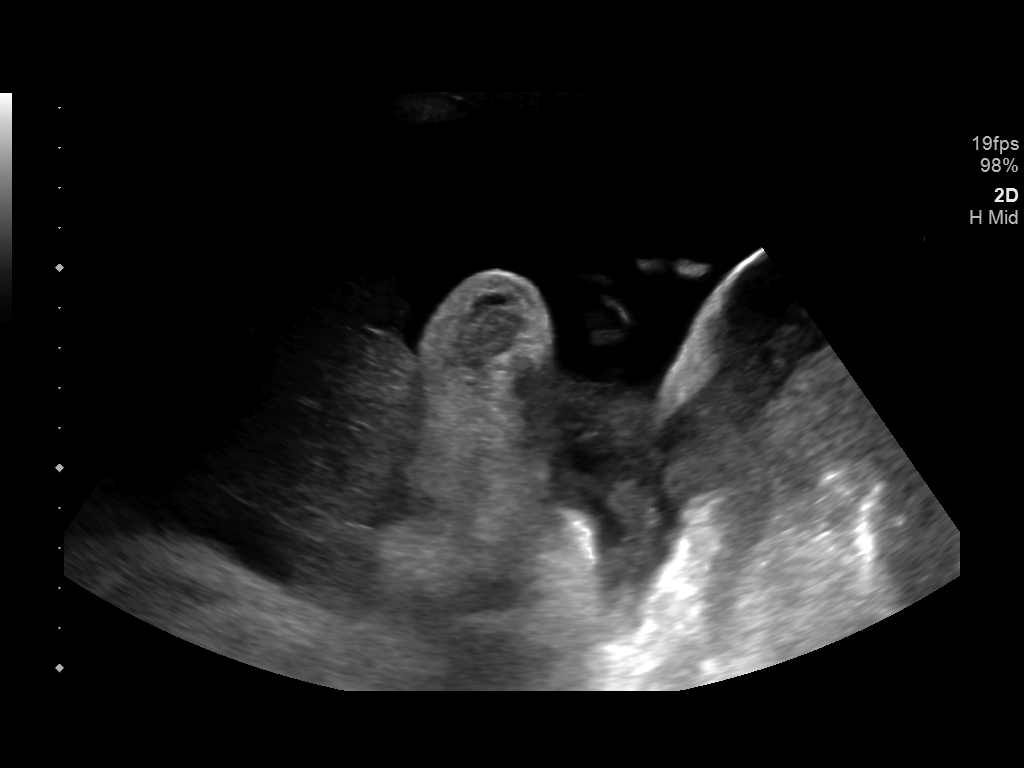
[im 2/5]
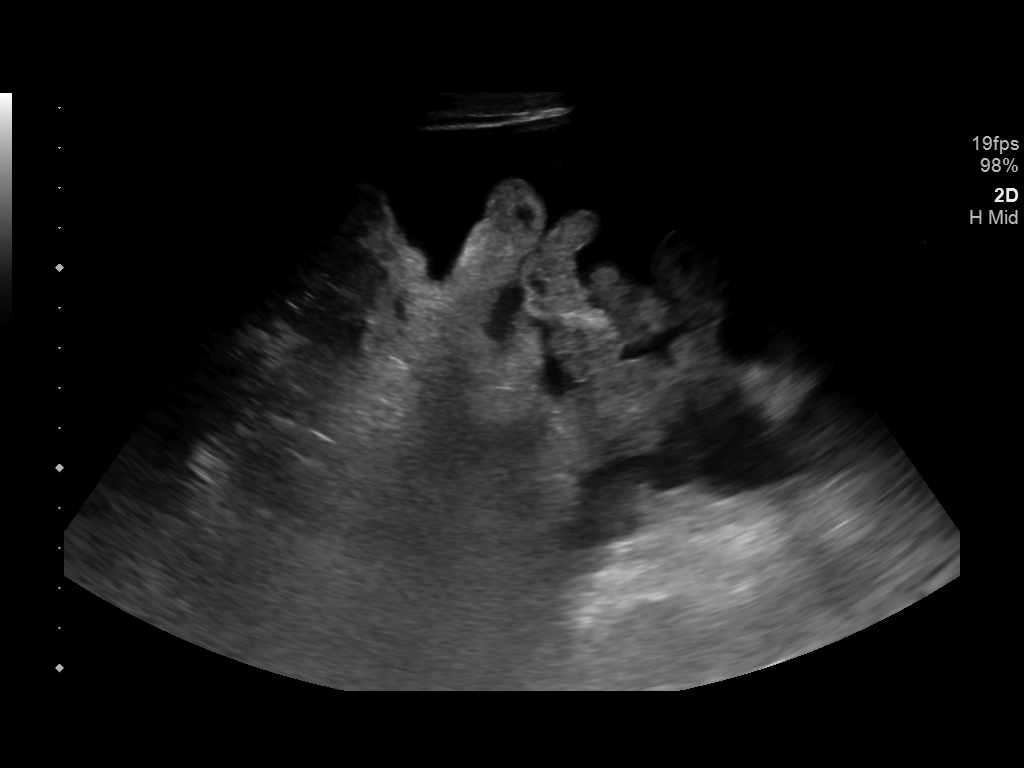
[im 3/5]
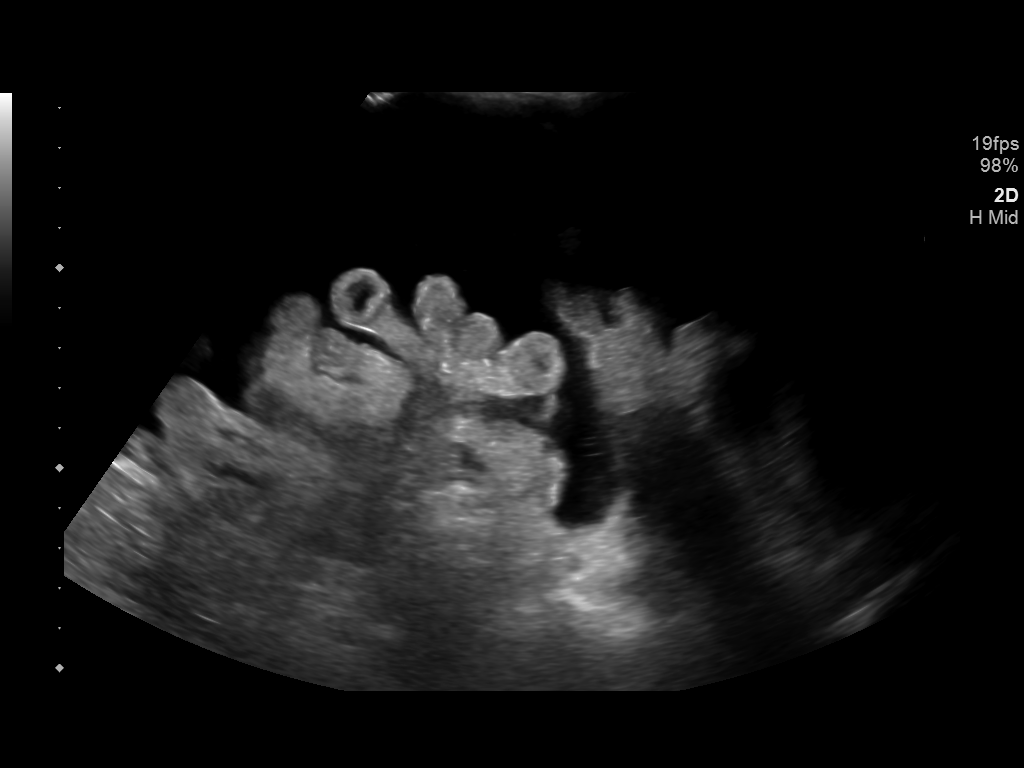
[im 4/5]
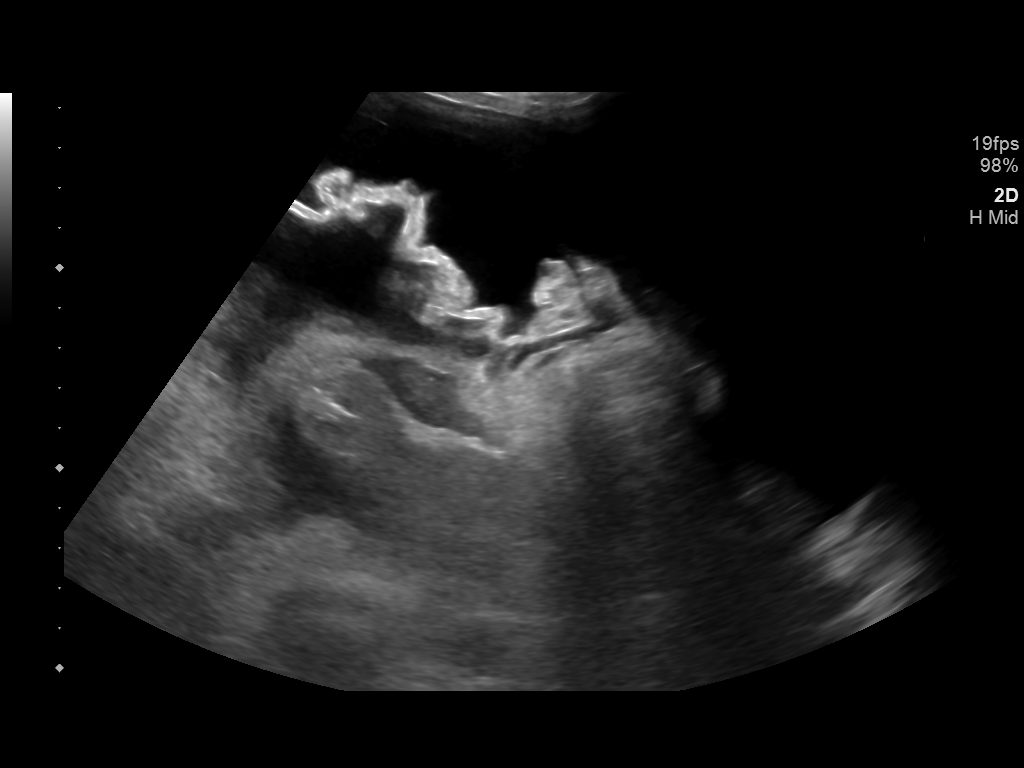
[im 5/5]
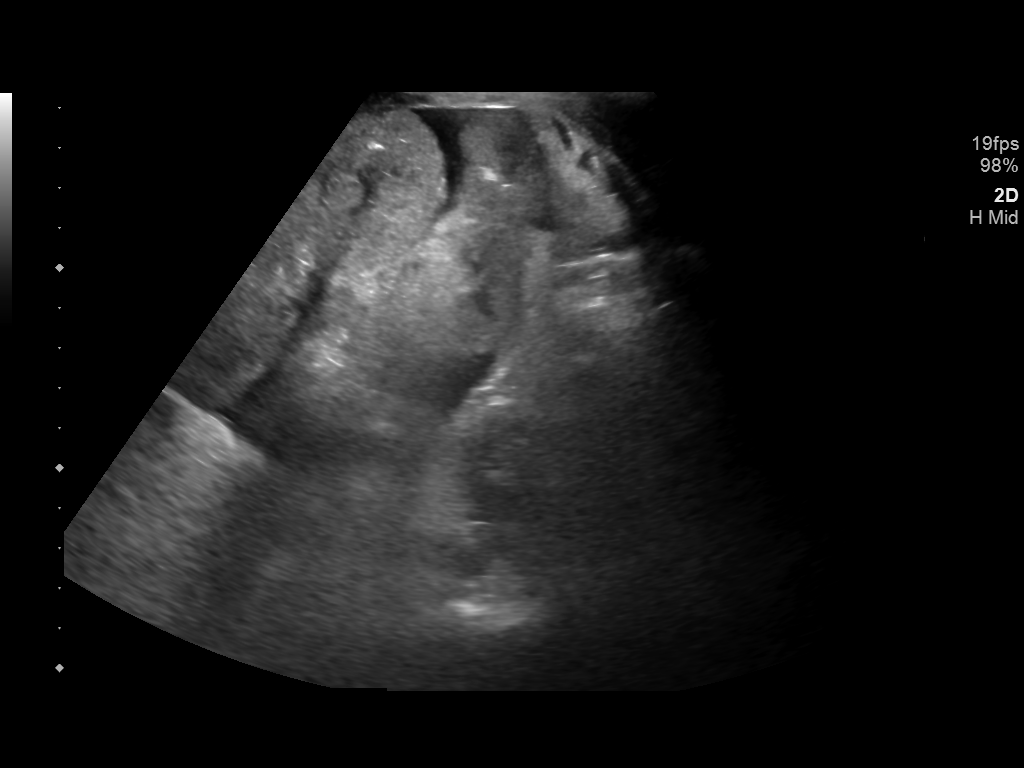

[5 of 5 positions shown; findings below may reference images not displayed]

EXAM:
ULTRASOUND GUIDED THERAPEUTIC PARACENTESIS

MEDICATIONS:
None

COMPLICATIONS:
None immediate.

PROCEDURE:
Informed written consent was obtained from the patient after a
discussion of the risks, benefits and alternatives to treatment. A
timeout was performed prior to the initiation of the procedure.

Initial ultrasound scanning demonstrates a large amount of ascites
within the left mid to lower abdominal quadrant. The left mid to
lower abdomen was prepped and draped in the usual sterile fashion.
1% lidocaine was used for local anesthesia.

Following this, a 19 gauge, 7-cm, Yueh catheter was introduced. An
ultrasound image was saved for documentation purposes. The
paracentesis was performed. The catheter was removed and a dressing
was applied. The patient tolerated the procedure well without
immediate post procedural complication.
FINDINGS: A total of approximately 5 liters of clear, yellow fluid was
removed.
IMPRESSION: Successful ultrasound-guided therapeutic paracentesis yielding 5
liters of peritoneal fluid.
